# Patient Record
Sex: Female | Born: 1945
Health system: Southern US, Community
[De-identification: ages and names within clinical notes are randomized; demographics above are authoritative.]

## PROBLEM LIST (undated history)

## (undated) DIAGNOSIS — N189 Chronic kidney disease, unspecified: Secondary | ICD-10-CM

## (undated) DIAGNOSIS — M359 Systemic involvement of connective tissue, unspecified: Secondary | ICD-10-CM

## (undated) DIAGNOSIS — I10 Essential (primary) hypertension: Secondary | ICD-10-CM

## (undated) DIAGNOSIS — I2510 Atherosclerotic heart disease of native coronary artery without angina pectoris: Secondary | ICD-10-CM

## (undated) DIAGNOSIS — C801 Malignant (primary) neoplasm, unspecified: Secondary | ICD-10-CM

## (undated) DIAGNOSIS — F1190 Opioid use, unspecified, uncomplicated: Secondary | ICD-10-CM

## (undated) DIAGNOSIS — M1120 Other chondrocalcinosis, unspecified site: Secondary | ICD-10-CM

## (undated) DIAGNOSIS — G709 Myoneural disorder, unspecified: Secondary | ICD-10-CM

## (undated) DIAGNOSIS — B029 Zoster without complications: Secondary | ICD-10-CM

## (undated) DIAGNOSIS — M542 Cervicalgia: Secondary | ICD-10-CM

## (undated) DIAGNOSIS — M4316 Spondylolisthesis, lumbar region: Secondary | ICD-10-CM

## (undated) DIAGNOSIS — R29898 Other symptoms and signs involving the musculoskeletal system: Secondary | ICD-10-CM

## (undated) DIAGNOSIS — M1990 Unspecified osteoarthritis, unspecified site: Secondary | ICD-10-CM

## (undated) DIAGNOSIS — Z8679 Personal history of other diseases of the circulatory system: Secondary | ICD-10-CM

## (undated) DIAGNOSIS — M5030 Other cervical disc degeneration, unspecified cervical region: Secondary | ICD-10-CM

## (undated) DIAGNOSIS — I639 Cerebral infarction, unspecified: Secondary | ICD-10-CM

## (undated) DIAGNOSIS — D649 Anemia, unspecified: Secondary | ICD-10-CM

## (undated) DIAGNOSIS — M810 Age-related osteoporosis without current pathological fracture: Secondary | ICD-10-CM

## (undated) DIAGNOSIS — E785 Hyperlipidemia, unspecified: Secondary | ICD-10-CM

## (undated) DIAGNOSIS — G894 Chronic pain syndrome: Secondary | ICD-10-CM

## (undated) DIAGNOSIS — C44300 Unspecified malignant neoplasm of skin of unspecified part of face: Secondary | ICD-10-CM

## (undated) DIAGNOSIS — Z9289 Personal history of other medical treatment: Secondary | ICD-10-CM

## (undated) DIAGNOSIS — H25019 Cortical age-related cataract, unspecified eye: Secondary | ICD-10-CM

## (undated) DIAGNOSIS — R011 Cardiac murmur, unspecified: Secondary | ICD-10-CM

## (undated) DIAGNOSIS — I499 Cardiac arrhythmia, unspecified: Secondary | ICD-10-CM

## (undated) DIAGNOSIS — M51369 Other intervertebral disc degeneration, lumbar region without mention of lumbar back pain or lower extremity pain: Secondary | ICD-10-CM

## (undated) DIAGNOSIS — I771 Stricture of artery: Secondary | ICD-10-CM

## (undated) DIAGNOSIS — M5136 Other intervertebral disc degeneration, lumbar region: Secondary | ICD-10-CM

## (undated) HISTORY — PX: MRI: SHX5353

## (undated) HISTORY — PX: CERVICAL SPINE SURGERY: SHX589

## (undated) HISTORY — PX: CORRECTION HAMMER TOE: SUR317

## (undated) HISTORY — PX: BACK SURGERY: SHX140

## (undated) HISTORY — DX: Stricture of artery: I77.1

## (undated) HISTORY — DX: Cerebral infarction, unspecified: I63.9

---

## 1979-01-17 HISTORY — PX: ABDOMINAL HYSTERECTOMY: SHX81

## 1979-01-17 HISTORY — PX: ROTATOR CUFF REPAIR: SHX139

## 1999-01-17 HISTORY — PX: SPINAL FUSION: SHX223

## 1999-01-17 HISTORY — PX: POSTERIOR LUMBAR FUSION: SHX6036

## 2001-01-16 HISTORY — PX: ANTERIOR CERVICAL DECOMP/DISCECTOMY FUSION: SHX1161

## 2001-01-16 HISTORY — PX: SPINAL FUSION: SHX223

## 2004-07-27 ENCOUNTER — Ambulatory Visit: Payer: Self-pay | Admitting: Pain Medicine

## 2008-05-12 DIAGNOSIS — G479 Sleep disorder, unspecified: Secondary | ICD-10-CM | POA: Insufficient documentation

## 2008-05-12 DIAGNOSIS — M542 Cervicalgia: Secondary | ICD-10-CM | POA: Insufficient documentation

## 2008-05-12 DIAGNOSIS — I10 Essential (primary) hypertension: Secondary | ICD-10-CM | POA: Insufficient documentation

## 2008-07-24 DIAGNOSIS — L709 Acne, unspecified: Secondary | ICD-10-CM | POA: Insufficient documentation

## 2008-07-24 DIAGNOSIS — R42 Dizziness and giddiness: Secondary | ICD-10-CM | POA: Insufficient documentation

## 2008-10-26 DIAGNOSIS — D493 Neoplasm of unspecified behavior of breast: Secondary | ICD-10-CM | POA: Insufficient documentation

## 2010-11-30 ENCOUNTER — Ambulatory Visit: Payer: Self-pay

## 2010-11-30 DIAGNOSIS — M25579 Pain in unspecified ankle and joints of unspecified foot: Secondary | ICD-10-CM | POA: Insufficient documentation

## 2011-03-28 DIAGNOSIS — G8929 Other chronic pain: Secondary | ICD-10-CM | POA: Insufficient documentation

## 2011-03-28 DIAGNOSIS — M542 Cervicalgia: Secondary | ICD-10-CM

## 2011-04-17 ENCOUNTER — Emergency Department: Payer: Self-pay | Admitting: *Deleted

## 2011-04-17 LAB — COMPREHENSIVE METABOLIC PANEL
Alkaline Phosphatase: 106 U/L (ref 50–136)
Bilirubin,Total: 0.7 mg/dL (ref 0.2–1.0)
Calcium, Total: 9.3 mg/dL (ref 8.5–10.1)
Chloride: 98 mmol/L (ref 98–107)
Co2: 25 mmol/L (ref 21–32)
Glucose: 140 mg/dL — ABNORMAL HIGH (ref 65–99)
SGPT (ALT): 26 U/L
Sodium: 138 mmol/L (ref 136–145)
Total Protein: 8.4 g/dL — ABNORMAL HIGH (ref 6.4–8.2)

## 2011-04-17 LAB — URINALYSIS, COMPLETE
Bilirubin,UR: NEGATIVE
Ph: 5 (ref 4.5–8.0)
Protein: NEGATIVE
RBC,UR: 5 /HPF (ref 0–5)
Squamous Epithelial: 1

## 2011-04-17 LAB — CBC
HCT: 48 % — ABNORMAL HIGH (ref 35.0–47.0)
MCH: 30.9 pg (ref 26.0–34.0)
MCHC: 34.1 g/dL (ref 32.0–36.0)
RBC: 5.31 10*6/uL — ABNORMAL HIGH (ref 3.80–5.20)
RDW: 14.8 % — ABNORMAL HIGH (ref 11.5–14.5)

## 2011-04-18 ENCOUNTER — Ambulatory Visit: Payer: Self-pay | Admitting: Family Medicine

## 2011-04-27 ENCOUNTER — Ambulatory Visit: Payer: Self-pay | Admitting: Family Medicine

## 2011-10-31 DIAGNOSIS — M359 Systemic involvement of connective tissue, unspecified: Secondary | ICD-10-CM | POA: Insufficient documentation

## 2011-10-31 DIAGNOSIS — M25469 Effusion, unspecified knee: Secondary | ICD-10-CM | POA: Insufficient documentation

## 2011-10-31 DIAGNOSIS — M1120 Other chondrocalcinosis, unspecified site: Secondary | ICD-10-CM | POA: Insufficient documentation

## 2012-06-20 DIAGNOSIS — M961 Postlaminectomy syndrome, not elsewhere classified: Secondary | ICD-10-CM | POA: Insufficient documentation

## 2012-06-20 DIAGNOSIS — Z9889 Other specified postprocedural states: Secondary | ICD-10-CM | POA: Insufficient documentation

## 2012-11-05 ENCOUNTER — Ambulatory Visit: Payer: Self-pay | Admitting: Anesthesiology

## 2013-02-19 ENCOUNTER — Ambulatory Visit: Payer: Self-pay | Admitting: General Practice

## 2013-02-19 LAB — URINALYSIS, COMPLETE
BILIRUBIN, UR: NEGATIVE
BLOOD: NEGATIVE
Glucose,UR: NEGATIVE mg/dL (ref 0–75)
KETONE: NEGATIVE
Leukocyte Esterase: NEGATIVE
NITRITE: NEGATIVE
PROTEIN: NEGATIVE
Ph: 8 (ref 4.5–8.0)
RBC,UR: <1 /HPF (ref 0–5)
SQUAMOUS EPITHELIAL: NONE SEEN
Specific Gravity: 1.01 (ref 1.003–1.030)
WBC UR: NONE SEEN /HPF (ref 0–5)

## 2013-02-19 LAB — BASIC METABOLIC PANEL
ANION GAP: 4 — AB (ref 7–16)
BUN: 42 mg/dL — AB (ref 7–18)
Calcium, Total: 9.3 mg/dL (ref 8.5–10.1)
Chloride: 98 mmol/L (ref 98–107)
Co2: 31 mmol/L (ref 21–32)
Creatinine: 1.1 mg/dL (ref 0.60–1.30)
EGFR (Non-African Amer.): 52 — ABNORMAL LOW
Glucose: 105 mg/dL — ABNORMAL HIGH (ref 65–99)
Osmolality: 277 (ref 275–301)
Potassium: 3.2 mmol/L — ABNORMAL LOW (ref 3.5–5.1)
Sodium: 133 mmol/L — ABNORMAL LOW (ref 136–145)

## 2013-02-19 LAB — CBC
HCT: 44.3 % (ref 35.0–47.0)
HGB: 15.2 g/dL (ref 12.0–16.0)
MCH: 30.8 pg (ref 26.0–34.0)
MCHC: 34.3 g/dL (ref 32.0–36.0)
MCV: 90 fL (ref 80–100)
Platelet: 268 10*3/uL (ref 150–440)
RBC: 4.92 10*6/uL (ref 3.80–5.20)
RDW: 14.1 % (ref 11.5–14.5)
WBC: 9.8 10*3/uL (ref 3.6–11.0)

## 2013-02-19 LAB — PROTIME-INR
INR: 0.9
PROTHROMBIN TIME: 12.3 s (ref 11.5–14.7)

## 2013-02-19 LAB — MRSA PCR SCREENING

## 2013-02-19 LAB — APTT: ACTIVATED PTT: 25.6 s (ref 23.6–35.9)

## 2013-02-19 LAB — SEDIMENTATION RATE: Erythrocyte Sed Rate: 10 mm/hr (ref 0–30)

## 2013-02-20 LAB — URINE CULTURE

## 2013-03-03 ENCOUNTER — Inpatient Hospital Stay: Payer: Self-pay | Admitting: General Practice

## 2013-03-03 HISTORY — PX: TOTAL KNEE ARTHROPLASTY: SHX125

## 2013-03-03 HISTORY — PX: JOINT REPLACEMENT: SHX530

## 2013-03-04 LAB — BASIC METABOLIC PANEL
ANION GAP: 6 — AB (ref 7–16)
BUN: 27 mg/dL — AB (ref 7–18)
CO2: 27 mmol/L (ref 21–32)
Calcium, Total: 8.3 mg/dL — ABNORMAL LOW (ref 8.5–10.1)
Chloride: 103 mmol/L (ref 98–107)
Creatinine: 1.28 mg/dL (ref 0.60–1.30)
EGFR (African American): 50 — ABNORMAL LOW
EGFR (Non-African Amer.): 43 — ABNORMAL LOW
Glucose: 127 mg/dL — ABNORMAL HIGH (ref 65–99)
Osmolality: 279 (ref 275–301)
Potassium: 3.8 mmol/L (ref 3.5–5.1)
SODIUM: 136 mmol/L (ref 136–145)

## 2013-03-04 LAB — HEMOGLOBIN: HGB: 10.7 g/dL — AB (ref 12.0–16.0)

## 2013-03-04 LAB — PLATELET COUNT: PLATELETS: 201 10*3/uL (ref 150–440)

## 2013-03-05 LAB — HEMOGLOBIN: HGB: 9.9 g/dL — ABNORMAL LOW (ref 12.0–16.0)

## 2013-03-05 LAB — PLATELET COUNT: Platelet: 202 10*3/uL (ref 150–440)

## 2013-03-05 LAB — BASIC METABOLIC PANEL
Anion Gap: 4 — ABNORMAL LOW (ref 7–16)
BUN: 28 mg/dL — AB (ref 7–18)
Calcium, Total: 8.3 mg/dL — ABNORMAL LOW (ref 8.5–10.1)
Chloride: 107 mmol/L (ref 98–107)
Co2: 29 mmol/L (ref 21–32)
Creatinine: 1.21 mg/dL (ref 0.60–1.30)
EGFR (African American): 54 — ABNORMAL LOW
EGFR (Non-African Amer.): 46 — ABNORMAL LOW
Glucose: 94 mg/dL (ref 65–99)
Osmolality: 285 (ref 275–301)
Potassium: 3.9 mmol/L (ref 3.5–5.1)
SODIUM: 140 mmol/L (ref 136–145)

## 2013-03-31 DIAGNOSIS — E782 Mixed hyperlipidemia: Secondary | ICD-10-CM | POA: Insufficient documentation

## 2013-04-21 DIAGNOSIS — M1180 Other specified crystal arthropathies, unspecified site: Secondary | ICD-10-CM | POA: Insufficient documentation

## 2013-05-22 DIAGNOSIS — Z8679 Personal history of other diseases of the circulatory system: Secondary | ICD-10-CM | POA: Insufficient documentation

## 2013-05-22 DIAGNOSIS — Z87898 Personal history of other specified conditions: Secondary | ICD-10-CM | POA: Insufficient documentation

## 2013-05-22 DIAGNOSIS — I10 Essential (primary) hypertension: Secondary | ICD-10-CM | POA: Insufficient documentation

## 2013-07-21 DIAGNOSIS — Z7952 Long term (current) use of systemic steroids: Secondary | ICD-10-CM | POA: Insufficient documentation

## 2013-07-29 DIAGNOSIS — R42 Dizziness and giddiness: Secondary | ICD-10-CM | POA: Insufficient documentation

## 2013-09-23 ENCOUNTER — Ambulatory Visit: Payer: Self-pay | Admitting: Family Medicine

## 2013-10-13 ENCOUNTER — Ambulatory Visit: Payer: Self-pay | Admitting: Family Medicine

## 2013-10-16 DIAGNOSIS — F5101 Primary insomnia: Secondary | ICD-10-CM | POA: Insufficient documentation

## 2013-10-16 DIAGNOSIS — Z79899 Other long term (current) drug therapy: Secondary | ICD-10-CM | POA: Insufficient documentation

## 2013-12-23 LAB — HM DEXA SCAN

## 2013-12-29 DIAGNOSIS — I340 Nonrheumatic mitral (valve) insufficiency: Secondary | ICD-10-CM | POA: Insufficient documentation

## 2014-01-19 DIAGNOSIS — M25562 Pain in left knee: Secondary | ICD-10-CM | POA: Diagnosis not present

## 2014-01-19 DIAGNOSIS — M8580 Other specified disorders of bone density and structure, unspecified site: Secondary | ICD-10-CM | POA: Diagnosis not present

## 2014-01-19 DIAGNOSIS — Z96652 Presence of left artificial knee joint: Secondary | ICD-10-CM | POA: Diagnosis not present

## 2014-02-26 DIAGNOSIS — Z96652 Presence of left artificial knee joint: Secondary | ICD-10-CM | POA: Diagnosis not present

## 2014-02-26 DIAGNOSIS — M76892 Other specified enthesopathies of left lower limb, excluding foot: Secondary | ICD-10-CM | POA: Diagnosis not present

## 2014-05-09 NOTE — Discharge Summary (Signed)
PATIENT NAME:  Molly Mitchell, Molly Mitchell MR#:  [id number] DATE OF BIRTH:  03-10-1945  DATE OF ADMISSION:  03/03/2013 DATE OF DISCHARGE:  03/06/2013  ADMITTING DIAGNOSIS: Degenerative arthrosis of left knee.   DISCHARGE DIAGNOSIS: Degenerative arthrosis of the left  knee.   HISTORY: The patient is a 69 year old who has been following at  [facility] for progression of left knee pain. She reported a 5 year history of persistent knee pain. She had not seen any significant improvement in her condition despite corticosteroid injections, as well as physical therapy. She had episodes of swelling of the knee as well as pain with weight-bearing activities. She was having difficulty with stair ambulation as well as with attempts at squatting. She had tried to maintain her activities with exercise program at the local gym. At the time of surgery, she was not using any ambulatory aid. The patient states that the pain had progressed to the point that it significantly interfering with her activities of daily living. X-rays taken in [place] showed bone-on-bone to the lateral compartment space with subchondral sclerosis as well as osteophyte formation being noted. There was no valgus deformity noted of approximately 13 degrees. After discussion of the risks and benefits of surgical intervention, the patient expressed her understanding of the risks and benefits and agreed for plans for surgical intervention.   PROCEDURE: Left total knee arthroplasty using computer-assisted navigation.   ANESTHESIA: General.   SOFT TISSUE RELEASE: Anterior cruciate ligament, posterior cruciate ligament, deep medial collateral ligaments, as well as patellofemoral ligament.   IMPLANTS UTILIZED: DePuy PFC Sigma size 3 posterior stabilized femoral component (cemented), size 2.5 MBT tibial component (cemented), 35 mm three pegged oval dome patella (cemented), and a 10 mm stabilized rotating platform polyethylene insert.    HOSPITAL COURSE: The patient tolerated the procedure very well. She had no complications. She was then taken to the PAC-U where she was stabilized and then transferred to the orthopedic floor. She began receiving anticoagulation therapy of Lovenox 30 mg subcutaneous every 12 hours per anesthesia and pharmacy protocol. She was fitted with TED stockings bilaterally. These were allowed to be removed one hour per eight hour shift. The left one was applied on day two following removal of the Hemovac and dressing change. The patient's heels were elevated off the bed using rolled towels. The has been no evidence of any DVTs. Negative Homans sign.   The patient has denied any chest pain or shortness of breath. Vital signs have been stable. She has been afebrile. Hemodynamically she was stable. No transfusions were given other than the Autovac transfusion given the first six hours postoperatively.   Physical therapy was initiated on day one for gait training and transfers. She did extremely well on day one. She was ambulating greater than 200 feet. She was able to get out of bed on her own. Had a range of motion of 0 to 1 or 2. However, the following day she was noted to have severe pain and had difficulty with therapy. However, she has a return to her level and was able to ambulate 200 feet and go up four steps. Occupational therapy was also initiated on day one for activities of daily living and assistive devices.   The patient's IV, Foley and Hemovac were discontinued on day two along with a dressing change. The wound was free of any drainage or signs of infection. Polar Care was reapplied to the surgical leg, maintaining a temperature of 40 to 50 degrees Fahrenheit.   DISPOSITION:  The patient is being discharged to home in improved stable condition.   DISCHARGE INSTRUCTIONS: She will continue weight-bearing as tolerated. Continue using a walker until told by physical therapy to go to a quad cane. Elevate  the lower extremities. Continue with TED stockings bilaterally. These are allowed to be removed at night but are to be worn during the day. Recommend elevating the heels off the bed. Incentive spirometer q.1h. while awake and as well as encourage cough, deep breathing two hours while awake. She is placed on a regular diet. Continue with the Polar Care maintaining a temperature of 40 to 50 degrees Fahrenheit. She is not to get the dressing wet. She will not be able to take a shower until the staples are removed at two weeks. She has a follow-up appointment on May 3rd at 845. She is to call the clinic sooner if any temperatures of 101.5 or greater or excessive bleeding.   DRUG ALLERGIES: AMITRIPTYLINE DURICEF, ERYTHROMYCIN AND TETANUS TOXOID.   MEDICATIONS: She is to resume her regular medication that she was on prior to admission. She was given a prescription for oxycodone 5 to 10 mg every 4 to 6 hours p.r.n. for pain, tramadol 50 to 100 mg every 4 to 6 hours p.r.n. for pain and Lovenox 40 mg subcutaneously daily for 14 days, then discontinue and begin taking  one 81 mg enteric-coated aspirin.   PAST MEDICAL HISTORY: Cataracts, varicella, hypertension, osteoporosis, shingles, pseudogout.  ____________________________ Vance Peper, PA jrw:sg D: 03/05/2013 10:18:15 ET T: 03/05/2013 11:01:29 ET JOB#: [id number]  cc: Vance Peper, PA, <Dictator> Kaylena Pacifico PA ELECTRONICALLY SIGNED 03/11/2013 8:01

## 2014-05-09 NOTE — Discharge Summary (Signed)
PATIENT NAME:  Molly Mitchell, KOERBER MR#:  [id number] DATE OF BIRTH:  [birth year]  DATE OF ADMISSION:  03/03/2013 DATE OF DISCHARGE:  03/06/2013   ADMITTING DIAGNOSIS: Degenerative arthrosis of the left knee.   DISCHARGE DIAGNOSIS: Degenerative arthrosis of the left knee.   OPERATION: On 03/03/2013, the patient had a left total knee arthroplasty using computer-aided navigation.   SURGEON: Laurice Record. Holley Bouche., MD  ASSISTANT: Vance Peper, PA   ANESTHESIA: General.   ESTIMATED BLOOD LOSS: 50 mL.   TOURNIQUET TIME: 80 minutes.   IMPLANTS USED: DePuy PFC Sigma size 3 posterior stabilized femoral component that was cemented, size 2.5 MBT tibial component (cemented), 35 mm 3 peg oval dome patella that was cemented, and 10 mm stabilized rotating platform polyethylene insert. The patient was stabilized, brought to the recovery room and then brought down to the orthopedic floor for pain control and physical therapy.   HISTORY: The patient is a 69 year old female who presented for upcoming total knee replacement on the left. The patient has had persistent left knee pain that has been worsening, with attempted relief with cortisone injections, physical therapy and activities of daily living modification. The patient continued to have pain though.   PHYSICAL EXAMINATION:  GENERAL: Well-developed, well-nourished female with an antalgic gait and a valgus thrust to the left knee.  HEART: Regular rate and rhythm.  LUNGS: Clear to auscultation.  MUSCULOSKELETAL: In regard to the left knee, the patient has lateral joint line tenderness with range of motion and full extension to 128 degrees of flexion. The patient has good quadriceps control. The patient is neurovascularly intact.   HOSPITAL COURSE: After initial admission on 03/03/2013, the patient was brought to the orthopedic floor. On postoperative day 1, the patient had a hemoglobin of 10.7, which dropped down to around 9.9 with no use of transfusion  postoperatively. The patient did receive Autovac transfusion though on postoperative day 1. The patient worked with physical therapy, initially ambulating almost 200 feet on day 1 with 102 degrees range of motion and continued progressing well and was ready to go home on 03/06/2013.   CONDITION AT DISCHARGE: Stable.   DISPOSITION: The patient was sent home with home health physical therapy.   DISCHARGE INSTRUCTIONS:  1. The patient will follow up with [facility] orthopedics on 03/18/2013 with Vance Peper. 2. The patient will do weight bear as tolerated on the affected leg and try to elevate her leg with 1 to 2 pillows to keep the foot higher than the knee.  3. The patient will use thigh-high TED hose on both legs, removed at bedtime, and will elevate the heels off the bed.  4. The patient will use the incentive spirometer every hour while awake and be encouraged to do cough and deep breathing.  5. The patient will use regular diet.  6. The patient will use her Polar Care to decrease swelling and try not to get her dressing wet or dirty and leave the dressing on and have it changed on a p.r.n. basis with physical therapy.  7. The patient will call the clinic if there is any bright red bleeding or any calf pain or bowel or bladder difficulty or fever greater than 101.5.  8. The patient will do physical therapy, working on gait training, range of motion and strengthening.  9. The patient will not shower until she has her staples removed.   DISCHARGE MEDICATIONS: Resume home medication and to begin taking oxycodone 5 mg 1 tablet q.4 hours as needed  for pain, tramadol 50 mg 1 tablet q.4-6 hours p.r.n. for less severe pain, Lovenox 40 mg subcutaneous once a day for 14 days, then stop and begin enteric-coated aspirin for the next month.    ____________________________ J. Reche Dixon, [place] jtm:lb D: 03/06/2013 07:00:14 ET T: 03/06/2013 07:08:43 ET JOB#: [id number]  cc: J. Reche Dixon, [place], <Dictator> J  Rudolfo Brand[facility] PA ELECTRONICALLY SIGNED 03/09/2013 6:59

## 2014-05-09 NOTE — Op Note (Signed)
PATIENT NAME:  Molly Mitchell, Molly Mitchell MR#:  [id number] DATE OF BIRTH:  03/23/1945  DATE OF PROCEDURE:  03/03/2013  PREOPERATIVE DIAGNOSIS: Degenerative arthrosis of the left knee.   POSTOPERATIVE DIAGNOSIS: Degenerative arthrosis of the left knee.   PROCEDURE PERFORMED: Left total knee arthroplasty using computer-assisted navigation.   SURGEON: Skip Estimable, M.D.   ASSISTANT: Vance Peper, PA (required to maintain retraction throughout the procedure).   ANESTHESIA: General.   ESTIMATED BLOOD LOSS: 50 mL.   FLUIDS REPLACED: 1500 mL of crystalloid.   TOURNIQUET TIME: 80 minutes.   DRAINS: Two medium drains to reinfusion system.   SOFT TISSUE RELEASES: Anterior cruciate ligament, posterior cruciate ligament, deep medial collateral ligament and patellofemoral ligament.   IMPLANTS UTILIZED: DePuy PFC Sigma size 3 posterior stabilized femoral component (cemented), size 2.5 MBT tibial component (cemented), 35 mm 3-peg oval dome patella (cemented) and a 10 mm stabilized rotating platform polyethylene insert.   INDICATIONS FOR SURGERY: The patient is a 69 year old female, who has been seen for complaints of progressive left knee pain. X-rays demonstrated severe degenerative changes with relative valgus deformity. After discussion of the risks and benefits of surgical intervention, the patient expressed understanding of the risks, benefits, and agreed with plans for surgical intervention.   PROCEDURE IN DETAIL: The patient was brought to the operating room then, after adequate general anesthesia was achieved, a tourniquet was placed on the patient's upper left thigh. The patient's left knee and leg were cleaned and prepped with alcohol and DuraPrep draped in the usual sterile fashion. A "timeout" was performed as per usual protocol. The left lower extremity was exsanguinated using an Esmarch and the tourniquet was inflated to 300 mmHg. An anterior longitudinal incision was made followed by a standard mid  vastus approach. A moderate effusion was evacuated. The deep fibers of the medial collateral ligament were elevated in a subperiosteal fashion off the medial flare of the tibia so as to maintain a continuous soft tissue sleeve. The patella was subluxed laterally and the patellofemoral ligament was incised. Inspection of the knee demonstrated severe degenerative changes in tricompartmental fashion with full-thickness loss of articular cartilage to the lateral compartment. Prominent osteophytes were debrided using a rongeur. Anterior and posterior cruciate ligaments were excised. Two 4.0 mm Schanz pins were inserted into the femur and into the tibia for attachment of the ray of trackers used for computer-assisted navigation. Hip center was identified using a circumduction technique. Distal landmarks were mapped using the computer. The distal femur and proximal tibia were mapped using the computer. Distal femoral cutting guide was positioned using computer-assisted navigation so as to achieve a 5 degree distal valgus cut. Cut was performed and verified using the computer. Distal femur was sized and it was felt that a size 3 femoral component was appropriate. A size 3 cutting guide was positioned and anterior cut was performed and verified using the computer. This was followed by completion of the posterior and chamfer cuts. Femoral cutting guide for the central box was then positioned and the central box cut was performed.   Attention was then directed to the proximal tibia. Medial and lateral menisci were excised. The extramedullary tibial cutting guide was positioned using computer-assisted navigation so as to achieve a 0 degree varus valgus alignment and 0 degree posterior slope. Cut was performed and verified using the computer. The proximal tibia was sized and it was felt that a size 2.5 tibial tray was appropriate. Tibial and femoral trials were inserted followed by insertion of a 10 mm  polyethylene insert. The  knee was tight in extension, but good medial and lateral soft tissue balancing was appreciated. Trial components were removed and the extramedullary tibial cutting guide was positioned so as to resect an additional 2 mm of bone. Cut was performed and verified using the computer. Tibial and femoral trials were reinserted, followed by insertion of a 10 mm polyethylene trial. Excellent medial and lateral soft tissue balance was appreciated both in full extension and in flexion. Finally, the patella was cut and prepared so as to accommodate a 35 mm 3-peg oval dome patella. Patellar trial was placed and the knee was placed through a range of motion with excellent patellar tracking appreciated. The femoral trial was removed. Central post hole for the tibial component was reamed followed by insertion of a keel punch. The tibial trials were then removed. The cut surfaces of bone were irrigated with copious amounts of normal saline with antibiotic solution using pulsatile lavage and then suctioned dry. Polymethyl methacrylate cement was prepared in the usual fashion using a vacuum mixer. Cement was applied to the cut surface of the proximal tibia as well as along the undersurface of a size 2.5 MBT tibial component. The tibial component was positioned and impacted into place. Excess cement was removed using [employer] fast streamer. Cement was then applied to the cut surface of the femur as well as along the posterior flanges of a size 3 posterior stabilized femoral component. Femoral component was positioned and impacted into place. Excess cement was removed using [employer] fast streamer.   A 10 mm polyethylene trial was inserted and the knee was brought in full extension with steady axial compression applied. Finally, cement was applied to the backside of a 35 mm 3-peg oval dome patella and patellar component was positioned and patellar clamp applied. Excess cement was removed using [employer] fast streamer. After adequate curing of cement, the  tourniquet was deflated after total tourniquet time of 80 minutes. Hemostasis was achieved using electrocautery. The knee was irrigated with copious amounts of normal saline with antibiotic solution using pulsatile lavage and then suctioned dry. The knee was inspected for any residual cement debris. Exparel 20 mL of 1.3% in 40 mL of normal saline was injected along the posterior capsule, medial and lateral gutters and along the arthrotomy site. A 10 mm stabilized rotating platform polyethylene insert was inserted and the knee was placed through a range of motion. Excellent patellar tracking was appreciated and excellent mediolateral soft tissue balancing was noted. Two medium drains were placed in the wound bed and brought out through a separate stab incision to be attached to a reinfusion system. The medial parapatellar portion of the incision was reapproximated using interrupted sutures of #1 Vicryl. The subcutaneous tissue was approximated in layers using first #0 Vicryl followed by 2-0 Vicryl. Skin was closed with skin staples. A sterile dressing was applied. The patient tolerated the procedure well. She was transported to the recovery room in stable condition. ____________________________ Laurice Record. Holley Bouche., MD jph:aw D: 03/03/2013 10:40:17 ET T: 03/03/2013 10:54:03 ET JOB#: [id number]  cc: Jeneen Rinks P. Holley Bouche., MD, <Dictator> Laurice Record Holley Bouche MD ELECTRONICALLY SIGNED 03/07/2013 6:44

## 2014-05-13 ENCOUNTER — Other Ambulatory Visit: Payer: Self-pay

## 2014-05-13 DIAGNOSIS — M546 Pain in thoracic spine: Secondary | ICD-10-CM | POA: Insufficient documentation

## 2014-05-13 DIAGNOSIS — M25569 Pain in unspecified knee: Secondary | ICD-10-CM | POA: Insufficient documentation

## 2014-05-13 DIAGNOSIS — F1193 Opioid use, unspecified with withdrawal: Secondary | ICD-10-CM | POA: Insufficient documentation

## 2014-05-13 DIAGNOSIS — K047 Periapical abscess without sinus: Secondary | ICD-10-CM | POA: Insufficient documentation

## 2014-05-13 DIAGNOSIS — J45909 Unspecified asthma, uncomplicated: Secondary | ICD-10-CM | POA: Insufficient documentation

## 2014-05-13 DIAGNOSIS — D72829 Elevated white blood cell count, unspecified: Secondary | ICD-10-CM | POA: Insufficient documentation

## 2014-05-13 DIAGNOSIS — M1710 Unilateral primary osteoarthritis, unspecified knee: Secondary | ICD-10-CM | POA: Insufficient documentation

## 2014-05-13 DIAGNOSIS — Z1331 Encounter for screening for depression: Secondary | ICD-10-CM | POA: Insufficient documentation

## 2014-05-13 DIAGNOSIS — B029 Zoster without complications: Secondary | ICD-10-CM | POA: Insufficient documentation

## 2014-05-13 DIAGNOSIS — J45991 Cough variant asthma: Secondary | ICD-10-CM | POA: Insufficient documentation

## 2014-05-13 DIAGNOSIS — F1123 Opioid dependence with withdrawal: Secondary | ICD-10-CM | POA: Insufficient documentation

## 2014-05-13 DIAGNOSIS — I459 Conduction disorder, unspecified: Secondary | ICD-10-CM | POA: Insufficient documentation

## 2014-05-13 DIAGNOSIS — R5383 Other fatigue: Secondary | ICD-10-CM | POA: Insufficient documentation

## 2014-05-13 DIAGNOSIS — J209 Acute bronchitis, unspecified: Secondary | ICD-10-CM | POA: Insufficient documentation

## 2014-05-13 DIAGNOSIS — E785 Hyperlipidemia, unspecified: Secondary | ICD-10-CM | POA: Insufficient documentation

## 2014-05-13 DIAGNOSIS — H6980 Other specified disorders of Eustachian tube, unspecified ear: Secondary | ICD-10-CM | POA: Insufficient documentation

## 2014-05-13 DIAGNOSIS — Z96659 Presence of unspecified artificial knee joint: Secondary | ICD-10-CM | POA: Insufficient documentation

## 2014-05-13 DIAGNOSIS — R1011 Right upper quadrant pain: Secondary | ICD-10-CM | POA: Insufficient documentation

## 2014-05-13 DIAGNOSIS — M8580 Other specified disorders of bone density and structure, unspecified site: Secondary | ICD-10-CM | POA: Insufficient documentation

## 2014-05-13 DIAGNOSIS — M179 Osteoarthritis of knee, unspecified: Secondary | ICD-10-CM | POA: Insufficient documentation

## 2014-05-13 DIAGNOSIS — E878 Other disorders of electrolyte and fluid balance, not elsewhere classified: Secondary | ICD-10-CM | POA: Insufficient documentation

## 2014-05-13 DIAGNOSIS — M2550 Pain in unspecified joint: Secondary | ICD-10-CM | POA: Insufficient documentation

## 2014-06-25 DIAGNOSIS — L03116 Cellulitis of left lower limb: Secondary | ICD-10-CM | POA: Diagnosis not present

## 2014-06-25 DIAGNOSIS — Z96652 Presence of left artificial knee joint: Secondary | ICD-10-CM | POA: Diagnosis not present

## 2014-07-02 DIAGNOSIS — M7632 Iliotibial band syndrome, left leg: Secondary | ICD-10-CM | POA: Diagnosis not present

## 2014-07-02 DIAGNOSIS — Z96652 Presence of left artificial knee joint: Secondary | ICD-10-CM | POA: Diagnosis not present

## 2014-07-02 DIAGNOSIS — L03116 Cellulitis of left lower limb: Secondary | ICD-10-CM | POA: Diagnosis not present

## 2014-07-14 ENCOUNTER — Encounter: Payer: Self-pay | Admitting: Family Medicine

## 2014-07-14 DIAGNOSIS — Z96652 Presence of left artificial knee joint: Secondary | ICD-10-CM | POA: Diagnosis not present

## 2014-07-16 ENCOUNTER — Encounter: Payer: Self-pay | Admitting: Family Medicine

## 2014-07-17 ENCOUNTER — Ambulatory Visit (INDEPENDENT_AMBULATORY_CARE_PROVIDER_SITE_OTHER): Payer: Commercial Managed Care - HMO | Admitting: Family Medicine

## 2014-07-17 ENCOUNTER — Other Ambulatory Visit: Payer: Self-pay

## 2014-07-17 ENCOUNTER — Encounter: Payer: Self-pay | Admitting: Family Medicine

## 2014-07-17 VITALS — BP 110/72 | HR 62 | Temp 98.2°F | Resp 16 | Ht 62.25 in | Wt 161.6 lb

## 2014-07-17 DIAGNOSIS — I10 Essential (primary) hypertension: Secondary | ICD-10-CM

## 2014-07-17 DIAGNOSIS — M542 Cervicalgia: Secondary | ICD-10-CM

## 2014-07-17 DIAGNOSIS — Z0000 Encounter for general adult medical examination without abnormal findings: Secondary | ICD-10-CM | POA: Diagnosis not present

## 2014-07-17 DIAGNOSIS — Z96652 Presence of left artificial knee joint: Secondary | ICD-10-CM | POA: Diagnosis not present

## 2014-07-17 DIAGNOSIS — E785 Hyperlipidemia, unspecified: Secondary | ICD-10-CM | POA: Diagnosis not present

## 2014-07-17 DIAGNOSIS — M1712 Unilateral primary osteoarthritis, left knee: Secondary | ICD-10-CM | POA: Diagnosis not present

## 2014-07-17 LAB — POCT URINALYSIS DIPSTICK
Bilirubin, UA: NEGATIVE
Glucose, UA: NEGATIVE
Ketones, UA: NEGATIVE
Leukocytes, UA: NEGATIVE
Nitrite, UA: NEGATIVE
Protein, UA: NEGATIVE
RBC UA: NEGATIVE
Spec Grav, UA: <=1.005
Urobilinogen, UA: 0.2
pH, UA: 8

## 2014-07-17 NOTE — Progress Notes (Signed)
Subjective:    Patient ID: Molly Mitchell, female    DOB: 30-Oct-1945, 68 y.o.   MRN: [id number]  HPI This 69 year old female present for annual wellness exam. Feeling well at the present. Has decided to stop the prednisone she was on from her rheumatologist and the gabapentin from the pain management clinic. Feels the neck and back pains with spasms are well controlled by using the Tizanidine prn at night. No longer taking the Ambien for sleep. States she had a pneumonia vaccination on 03-03-13 during a hospitalization for left knee replacement and she has a history of being allergic to tetanus. Last mammograms in the spring of 2016 were essentially normal. Always declines colonoscopy. BMD test on 12-23-13 showed some osteopenia probably from past chronic use of prednisone. Presently using calcium supplements. Patient Active Problem List   Diagnosis Date Noted  . Acute bronchitis 05/13/2014  . Dysfunction of eustachian tube 05/13/2014  . Cardiac conduction disorder 05/13/2014  . Ache in joint 05/13/2014  . AB (asthmatic bronchitis) 05/13/2014  . Gonalgia 05/13/2014  . Asthma, cough variant 05/13/2014  . Screening for depression 05/13/2014  . Electrolyte imbalance 05/13/2014  . Fatigue 05/13/2014  . H/O total knee replacement 05/13/2014  . HLD (hyperlipidemia) 05/13/2014  . Elevated WBC count 05/13/2014  . Drug withdrawal syndrome to opium 05/13/2014  . Arthritis of knee, degenerative 05/13/2014  . Osteopenia 05/13/2014  . Abdominal pain, right upper quadrant 05/13/2014  . Herpes zona 05/13/2014  . Periapical abscess without sinus tract 05/13/2014  . Pain in thoracic spine 05/13/2014  . MI (mitral incompetence) 12/29/2013  . Idiopathic insomnia 10/16/2013  . Polypharmacy 10/16/2013  . Dizziness 07/29/2013  . Long term current use of systemic steroids 07/21/2013  . History of prolonged Q-T interval on ECG 05/22/2013  . Benign essential HTN 05/22/2013  . 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 Combined fat and carbohydrate induced hyperlipemia 03/31/2013  . S/P lumbar spine operation 06/20/2012  . Cervical post-laminectomy syndrome 06/20/2012  . Tumoral calcinosis 10/31/2011  . Connective tissue disease, undifferentiated 10/31/2011  . Effusion of knee 10/31/2011  . Chondrocalcinosis due to dicalcium phosphate crystals 10/31/2011  . Chronic cervical pain 03/28/2011  . Arthralgia of ankle or foot 11/30/2010  . Neoplasm of breast 10/26/2008  . Acne 07/24/2008  . Dizziness and giddiness 07/24/2008  . Cervical pain 05/12/2008  . Dyssomnia 05/12/2008  . Essential (primary) hypertension 05/12/2008   Past Surgical History  Procedure Laterality Date  . Joint replacement Left 03/03/2013  . Abdominal hysterectomy  1981  . Spinal fusion  1991    lower back   . Spinal fusion  2003    neck   History   Social History  . Marital Status: Married    Spouse Name: N/A  . Number of Children: N/A  . Years of Education: N/A   Social History Main Topics  . Smoking status: Former Smoker    Quit date: 04/17/1979  . Smokeless tobacco: Not on file  . Alcohol Use: No  . Drug Use: No  . Sexual Activity: Not on file   Other Topics Concern  . None   Social History Narrative   Current Outpatient Prescriptions on File Prior to Visit  Medication Sig Dispense Refill  . atenolol (TENORMIN) 50 MG tablet Take 1 tablet by mouth 1 day or 1 dose.     Marland Kitchen CALCIUM-MAGNESIUM-ZINC PO Take 1 tablet by mouth daily.    . clotrimazole-betamethasone (LOTRISONE) cream Apply 1  application topically 2 (two) times daily.    . MULTIPLE VITAMIN PO Take 1 tablet by mouth daily.    . OMEGA-3 FATTY ACIDS PO Take 1 capsule by mouth daily.    Marland Kitchen triamterene-hydrochlorothiazide (MAXZIDE-25) 37.5-25 MG per tablet Take 1 tablet by mouth daily.    Marland Kitchen nystatin ointment (MYCOSTATIN) 1 application as needed.     No current facility-administered medications on file prior to visit.    Allergies  Allergen Reactions  . Amitriptyline Hcl   . Duricef  [Cefadroxil]   . Erythromycin   . Levofloxacin     Severe Shoulder Pain  . Tetanus Toxoid    Immunization History  Administered Date(s) Administered  . Influenza-Unspecified 11/23/2011   Review of Systems  Constitutional: Negative.   HENT: Negative.   Eyes: Negative.   Respiratory: Negative.   Cardiovascular: Negative.   Gastrointestinal: Negative.   Genitourinary: Negative.   Musculoskeletal: Positive for back pain and neck pain.       History of cervical and lumbar fusions.  Skin: Negative.   Neurological: Negative.   Psychiatric/Behavioral: Negative.       BP 110/72 mmHg  Pulse 62  Temp(Src) 98.2 F (36.8 C) (Oral)  Resp 16  Ht 5' 2.25" (1.581 m)  Wt 161 lb 9.6 oz (73.301 kg)  BMI 29.33 kg/m2  SpO2 97%  Objective:   Physical Exam  Constitutional: She is oriented to person, place, and time. She appears well-nourished.  HENT:  Head: Normocephalic and atraumatic.  Right Ear: External ear normal.  Left Ear: External ear normal.  Nose: Nose normal.  Mouth/Throat: Oropharynx is clear and moist.  Eyes: Conjunctivae and EOM are normal. Pupils are equal, round, and reactive to light.  Neck: No thyromegaly present.  Cardiovascular: Normal rate, regular rhythm, normal heart sounds and intact distal pulses.   Pulmonary/Chest: Effort normal and breath sounds normal.  Abdominal: Soft. Bowel sounds are normal.  Genitourinary:  Exam deferred. History of TAH.  Musculoskeletal:  Well healed scars of the left knee from joint replacement. A little decrease of in flexion but able to get to 90 degrees without pain. Stiffness of neck limits rotation to the left significantly. Chronic discomfort and spasm of neck controlled to a tolerable degree.  Lymphadenopathy:    She has no cervical adenopathy.  Neurological: She is alert and oriented to person, place, and time.  Skin: Skin is warm and dry.  Psychiatric:  She has a normal mood and affect. Her behavior is normal. Thought content normal.      Assessment & Plan:   1. Annual physical exam General health good. Declines colonoscopy. Had BMD 12-24-14 consistent with osteopenia suspected secondary to past chronic prednisone use. Encouraged to continue calcium and vitamin D supplements. Will check with insurance about coverage for prevnar 13 and shingles vaccinations.  - POCT urinalysis dipstick  2. Benign essential HTN Well controlled BP. Followed by Dr. Nehemiah Massed (cardiologist annually) and decreased Atenolol to one 50 mg qd with Maxzide-25 mg qd. Tolerating well with good compliance. Will check routine labs and follow up pending reports. - TSH - Comprehensive metabolic panel - CBC with Differential/Platelet  3. Primary osteoarthritis of left knee Stable since left knee replacement 03-03-13. Had a recent infection identified by her orthopedist and treated with Doxycycline. Much improved and states it probably came from the joint replacement. Will recheck CBC with diff and follow up pending reports. - CBC with Differential/Platelet  4. Cervical pain Stable and spasm well controlled by use of Tizanidine  prn. No longer going to pain management or taking any narcotic pain medication.  5. H/O total knee replacement, left Feeling well but experience a "tough" recovery. Follow up with orthopedist as planned.  6. Hyperlipidemia Tolerating Omega-3 Fatty Acid and low fat diet. Will check labs to evaluate progress.  - Lipid panel - TSH - Comprehensive metabolic panel

## 2014-07-18 LAB — COMPREHENSIVE METABOLIC PANEL
ALBUMIN: 5 g/dL — AB (ref 3.6–4.8)
ALT: 19 IU/L (ref 0–32)
AST: 25 IU/L (ref 0–40)
Albumin/Globulin Ratio: 1.6 (ref 1.1–2.5)
Alkaline Phosphatase: 119 IU/L — ABNORMAL HIGH (ref 39–117)
BUN/Creatinine Ratio: 27 — ABNORMAL HIGH (ref 11–26)
BUN: 32 mg/dL — ABNORMAL HIGH (ref 8–27)
Bilirubin Total: 0.7 mg/dL (ref 0.0–1.2)
CALCIUM: 10.3 mg/dL (ref 8.7–10.3)
CHLORIDE: 96 mmol/L — AB (ref 97–108)
CO2: 27 mmol/L (ref 18–29)
Creatinine, Ser: 1.19 mg/dL — ABNORMAL HIGH (ref 0.57–1.00)
GFR calc Af Amer: 54 mL/min/{1.73_m2} — ABNORMAL LOW (ref >59)
GFR, EST NON AFRICAN AMERICAN: 47 mL/min/{1.73_m2} — AB (ref >59)
GLOBULIN, TOTAL: 3.1 g/dL (ref 1.5–4.5)
Glucose: 107 mg/dL — ABNORMAL HIGH (ref 65–99)
Potassium: 4.7 mmol/L (ref 3.5–5.2)
Sodium: 141 mmol/L (ref 134–144)
TOTAL PROTEIN: 8.1 g/dL (ref 6.0–8.5)

## 2014-07-18 LAB — CBC WITH DIFFERENTIAL/PLATELET
BASOS ABS: 0.1 10*3/uL (ref 0.0–0.2)
Basos: 1 %
EOS (ABSOLUTE): 0.2 10*3/uL (ref 0.0–0.4)
Eos: 2 %
Hematocrit: 45.5 % (ref 34.0–46.6)
Hemoglobin: 15.4 g/dL (ref 11.1–15.9)
IMMATURE GRANS (ABS): 0 10*3/uL (ref 0.0–0.1)
Immature Granulocytes: 0 %
Lymphocytes Absolute: 3.3 10*3/uL — ABNORMAL HIGH (ref 0.7–3.1)
Lymphs: 39 %
MCH: 29.7 pg (ref 26.6–33.0)
MCHC: 33.8 g/dL (ref 31.5–35.7)
MCV: 88 fL (ref 79–97)
MONOCYTES: 8 %
Monocytes Absolute: 0.7 10*3/uL (ref 0.1–0.9)
NEUTROS ABS: 4.2 10*3/uL (ref 1.4–7.0)
NEUTROS PCT: 50 %
PLATELETS: 288 10*3/uL (ref 150–379)
RBC: 5.19 x10E6/uL (ref 3.77–5.28)
RDW: 16.7 % — ABNORMAL HIGH (ref 12.3–15.4)
WBC: 8.5 10*3/uL (ref 3.4–10.8)

## 2014-07-18 LAB — TSH: TSH: 1.86 u[IU]/mL (ref 0.450–4.500)

## 2014-07-18 LAB — LIPID PANEL
Chol/HDL Ratio: 3.3 ratio units (ref 0.0–4.4)
Cholesterol, Total: 248 mg/dL — ABNORMAL HIGH (ref 100–199)
HDL: 75 mg/dL (ref >39)
LDL Calculated: 149 mg/dL — ABNORMAL HIGH (ref 0–99)
Triglycerides: 118 mg/dL (ref 0–149)
VLDL Cholesterol Cal: 24 mg/dL (ref 5–40)

## 2014-07-23 ENCOUNTER — Telehealth: Payer: Self-pay

## 2014-07-23 NOTE — Telephone Encounter (Signed)
-----   Message from Margo Common, [place] sent at 07/23/2014  1:02 AM EDT ----- Blood tests show normal thyroid function, slight kidney strain (better than past tests) and no anemia or infection. Cholesterol total and LDL high with very good HDL cholesterol (the "good" cholesterol). May add Red Yeast Rice qd to the Omega-3 Fish Oil and increase water intake with extra fiber in diet. Recheck progress in 3 months.

## 2014-07-23 NOTE — Telephone Encounter (Signed)
Patient advised as directed below. Patient verbalized understanding and agrees with treatment plan. 

## 2014-08-13 DIAGNOSIS — Z96652 Presence of left artificial knee joint: Secondary | ICD-10-CM | POA: Diagnosis not present

## 2014-10-15 ENCOUNTER — Other Ambulatory Visit: Payer: Self-pay | Admitting: Family Medicine

## 2014-10-16 NOTE — Telephone Encounter (Signed)
Patient advised as directed below. Patient verbalized understanding.  

## 2014-10-16 NOTE — Telephone Encounter (Signed)
Advise patient NOT to take potassium supplement with the Maxzide (triamterene-hydrochlorothiazide) because is a potassium sparing combination and last potassium level was very good.

## 2014-12-18 DIAGNOSIS — E782 Mixed hyperlipidemia: Secondary | ICD-10-CM | POA: Diagnosis not present

## 2014-12-18 DIAGNOSIS — I10 Essential (primary) hypertension: Secondary | ICD-10-CM | POA: Diagnosis not present

## 2014-12-18 DIAGNOSIS — Z8679 Personal history of other diseases of the circulatory system: Secondary | ICD-10-CM | POA: Diagnosis not present

## 2014-12-29 ENCOUNTER — Encounter: Payer: Self-pay | Admitting: Family Medicine

## 2014-12-29 ENCOUNTER — Ambulatory Visit (INDEPENDENT_AMBULATORY_CARE_PROVIDER_SITE_OTHER): Payer: Commercial Managed Care - HMO | Admitting: Family Medicine

## 2014-12-29 VITALS — BP 130/72 | HR 57 | Temp 98.3°F | Resp 14 | Wt 165.4 lb

## 2014-12-29 DIAGNOSIS — I10 Essential (primary) hypertension: Secondary | ICD-10-CM | POA: Diagnosis not present

## 2014-12-29 DIAGNOSIS — E782 Mixed hyperlipidemia: Secondary | ICD-10-CM

## 2014-12-29 DIAGNOSIS — H109 Unspecified conjunctivitis: Secondary | ICD-10-CM | POA: Diagnosis not present

## 2014-12-29 MED ORDER — SULFACETAMIDE-PREDNISOLONE 10-0.2 % OP SUSP: 1.0000 [drp] | OPHTHALMIC | Status: DC

## 2014-12-29 NOTE — Progress Notes (Signed)
Patient ID: Molly Mitchell, female   DOB: 1945-07-30, 69 y.o.   MRN: [id number]   Patient: Molly Mitchell Female    DOB: [birth year]   69 y.o.   MRN: [id number] Visit Date: 12/29/2014  Today's Provider: [name], PA   Chief Complaint  Patient presents with  . Eye Problem   Subjective:    Hyperlipidemia The current episode started more than 1 year ago. Recent lipid tests were reviewed and are high. Exacerbated by: chronic prednisone use suspected. Current antihyperlipidemic treatment includes diet change and herbal therapy. There are no compliance problems.     Patient comes in today for left eye redness, itching, and discharge X 1 week. Patient denies blurry vision. Some matting of eyelashes in the mornings. Clear drainage at night down the lateral corner of the left eye. No recent URI symptoms or fever.  Patient Active Problem List   Diagnosis Date Noted  . Acute bronchitis 05/13/2014  . Dysfunction of eustachian tube 05/13/2014  . Cardiac conduction disorder 05/13/2014  . Ache in joint 05/13/2014  . AB (asthmatic bronchitis) 05/13/2014  . Gonalgia 05/13/2014  . Asthma, cough variant 05/13/2014  . Screening for depression 05/13/2014  . Electrolyte imbalance 05/13/2014  . Fatigue 05/13/2014  . H/O total knee replacement 05/13/2014  . HLD (hyperlipidemia) 05/13/2014  . Elevated WBC count 05/13/2014  . Drug withdrawal syndrome to opium ([place]) 05/13/2014  . Arthritis of knee, degenerative 05/13/2014  . Osteopenia 05/13/2014  . Abdominal pain, right upper quadrant 05/13/2014  . Herpes zona 05/13/2014  . Periapical abscess without sinus tract 05/13/2014  . Pain in thoracic spine 05/13/2014  . MI (mitral incompetence) 12/29/2013  . Idiopathic insomnia 10/16/2013  . Polypharmacy 10/16/2013  . Dizziness 07/29/2013  . Long term current use of systemic steroids 07/21/2013  . History of prolonged Q-T interval on ECG 05/22/2013  . Benign essential HTN 05/22/2013  . Personal  history of other diseases of the circulatory system 05/22/2013  . 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 Combined fat and carbohydrate induced hyperlipemia 03/31/2013  . S/P lumbar spine operation 06/20/2012  . Cervical post-laminectomy syndrome 06/20/2012  . Status post lumbar spine operation 06/20/2012  . Tumoral calcinosis 10/31/2011  . Connective tissue disease, undifferentiated ([place]) 10/31/2011  . Effusion of knee 10/31/2011  . Chondrocalcinosis due to dicalcium phosphate crystals 10/31/2011  . Undifferentiated connective tissue disease ([place]) 10/31/2011  . Chronic cervical pain 03/28/2011  . Arthralgia of ankle or foot 11/30/2010  . Neoplasm of breast 10/26/2008  . Acne 07/24/2008  . Dizziness and giddiness 07/24/2008  . Cervical pain 05/12/2008  . Dyssomnia 05/12/2008  . Essential (primary) hypertension 05/12/2008   Past Surgical History  Procedure Laterality Date  . Joint replacement Left 03/03/2013  . Abdominal hysterectomy  1981  . Spinal fusion  1991    lower back   . Spinal fusion  2003    neck   Family History  Problem Relation Age of Onset  . Hypertension Brother   . Diabetes Paternal Grandmother   . Heart failure Maternal Grandmother   . Hypertension Mother   . Hypertension Sister   . Cancer Sister       Allergies  Allergen Reactions  . Amitriptyline Hcl   . Duricef  [Cefadroxil]   . Erythromycin   . Levofloxacin     Severe Shoulder Pain  . Tetanus Toxoid     Previous Medications   ASPIRIN EC 81 MG TABLET    Take by mouth.  ATENOLOL (TENORMIN) 50 MG TABLET    Take 1 tablet by mouth 1 day or 1 dose.    CALCIUM-MAGNESIUM-ZINC PO    Take 1 tablet by mouth daily.   CHOLECALCIFEROL (VITAMIN D3) 1000 UNITS CAPS    Take by mouth.   CLOTRIMAZOLE-BETAMETHASONE (LOTRISONE) CREAM    Apply 1 application topically 2 (two) times daily.   CO-ENZYME Q-10 30 MG CAPS    Take by mouth.   GABAPENTIN (NEURONTIN) 100 MG CAPSULE    Take 2  capsules by mouth at bedtime.   MAGNESIUM OXIDE (MAG-OX) 400 MG TABLET    Take by mouth.   MECLIZINE (ANTIVERT) 25 MG TABLET    Take by mouth.   MOMETASONE-FORMOTEROL (DULERA) 200-5 MCG/ACT AERO    Inhale 1-2 Inhalers into the lungs 2 (two) times daily.   MULTIPLE VITAMIN PO    Take 1 tablet by mouth daily.   NAPROXEN SODIUM PO       NIACIN 500 MG TABLET    Take by mouth.   NYSTATIN OINTMENT (MYCOSTATIN)    1 application as needed.   OMEGA-3 FATTY ACIDS PO    Take 1 capsule by mouth daily.   POTASSIUM 99 MG TABS    Take by mouth.   RED YEAST RICE EXTRACT 600 MG CAPS    Take by mouth.   TIZANIDINE (ZANAFLEX) 2 MG CAPSULE    Take 2 mg by mouth 3 (three) times daily.   TRIAMTERENE-HYDROCHLOROTHIAZIDE (MAXZIDE-25) 37.5-25 MG TABLET    TAKE 1 TABLET EVERY DAY   VITAMIN C (ASCORBIC ACID) 500 MG TABLET    Take by mouth.   VITAMIN E 400 UNIT CAPSULE    1 capsule daily.    Review of Systems  Constitutional: Negative.   HENT: Negative.   Eyes: Positive for discharge, redness and itching.  Respiratory: Negative.   Cardiovascular: Negative.   Gastrointestinal: Negative.   Endocrine: Negative.   Genitourinary: Negative.   Musculoskeletal: Negative.   Skin: Negative.   Allergic/Immunologic: Negative.   Neurological: Negative.   Psychiatric/Behavioral: Negative.     Social History  Substance Use Topics  . Smoking status: Former Smoker    Quit date: 04/17/1979  . Smokeless tobacco: Not on file  . Alcohol Use: No   Objective:    BP 130/72 mmHg  Pulse 57  Temp(Src) 98.3 F (36.8 C) (Oral)  Resp 14  Wt 165 lb 6.4 oz (75.025 kg)  SpO2 97%   Physical Exam  Constitutional: She is oriented to person, place, and time. She appears well-developed and well-nourished.  HENT:  Head: Normocephalic.  Right Ear: External ear normal.  Left Ear: External ear normal.  Nose: Nose normal.  Mouth/Throat: Oropharynx is clear and moist.  Eyes:  Fiery red palpebral and bulbar conjunctiva of the  left eye with clear drainage.  Neck: Normal range of motion. Neck supple.  Cardiovascular: Normal rate and regular rhythm.   Pulmonary/Chest: Effort normal and breath sounds normal.  Abdominal: Soft. Bowel sounds are normal.  Neurological: She is alert and oriented to person, place, and time.      Assessment & Plan:     1. Conjunctivitis of left eye Onset over the past week. May use Claritin prn itch with warm compresses. Given Blephamide eye drops and recheck in 3 days if no better. - sulfacetamide-prednisolone (BLEPHAMIDE) 10-0.2 % ophthalmic suspension; Place 1 drop into the left eye every 3 (three) hours.  Dispense: 5 mL; Refill: 0  2. Combined fat and carbohydrate induced hyperlipemia  Last LDL was 149 with HDL 75 in July 2016. Has tried [employer] because she did not want to get started on any medications. Continue low fat diet and exercise. Recheck pending lab reports. - Comprehensive metabolic panel - Lipid panel  3. Benign essential HTN Stable with slight elevation of diastolic pressure. Tolerating Maxzide once a day without side effects. Will check CBC and renal function. Recheck prn. - CBC with Differential/Platelet

## 2014-12-29 NOTE — Patient Instructions (Signed)

## 2014-12-30 LAB — CBC WITH DIFFERENTIAL/PLATELET
BASOS ABS: 0 10*3/uL (ref 0.0–0.2)
BASOS: 1 %
EOS (ABSOLUTE): 0.2 10*3/uL (ref 0.0–0.4)
Eos: 2 %
Hematocrit: 42.6 % (ref 34.0–46.6)
Hemoglobin: 14.7 g/dL (ref 11.1–15.9)
Immature Grans (Abs): 0 10*3/uL (ref 0.0–0.1)
Immature Granulocytes: 0 %
LYMPHS ABS: 2.9 10*3/uL (ref 0.7–3.1)
LYMPHS: 34 %
MCH: 31.9 pg (ref 26.6–33.0)
MCHC: 34.5 g/dL (ref 31.5–35.7)
MCV: 92 fL (ref 79–97)
Monocytes Absolute: 0.6 10*3/uL (ref 0.1–0.9)
Monocytes: 7 %
NEUTROS ABS: 4.7 10*3/uL (ref 1.4–7.0)
Neutrophils: 56 %
PLATELETS: 269 10*3/uL (ref 150–379)
RBC: 4.61 x10E6/uL (ref 3.77–5.28)
RDW: 14.6 % (ref 12.3–15.4)
WBC: 8.4 10*3/uL (ref 3.4–10.8)

## 2014-12-30 LAB — COMPREHENSIVE METABOLIC PANEL
ALT: 17 IU/L (ref 0–32)
AST: 22 IU/L (ref 0–40)
Albumin/Globulin Ratio: 1.7 (ref 1.1–2.5)
Albumin: 4.5 g/dL (ref 3.6–4.8)
Alkaline Phosphatase: 104 IU/L (ref 39–117)
BILIRUBIN TOTAL: 0.5 mg/dL (ref 0.0–1.2)
BUN / CREAT RATIO: 30 — AB (ref 11–26)
BUN: 34 mg/dL — ABNORMAL HIGH (ref 8–27)
CALCIUM: 9.5 mg/dL (ref 8.7–10.3)
CHLORIDE: 97 mmol/L (ref 96–106)
CO2: 26 mmol/L (ref 18–29)
Creatinine, Ser: 1.14 mg/dL — ABNORMAL HIGH (ref 0.57–1.00)
GFR, EST AFRICAN AMERICAN: 57 mL/min/{1.73_m2} — AB (ref >59)
GFR, EST NON AFRICAN AMERICAN: 50 mL/min/{1.73_m2} — AB (ref >59)
Globulin, Total: 2.6 g/dL (ref 1.5–4.5)
Glucose: 93 mg/dL (ref 65–99)
Potassium: 4.7 mmol/L (ref 3.5–5.2)
Sodium: 138 mmol/L (ref 134–144)
TOTAL PROTEIN: 7.1 g/dL (ref 6.0–8.5)

## 2014-12-30 LAB — LIPID PANEL
Chol/HDL Ratio: 3.2 ratio units (ref 0.0–4.4)
Cholesterol, Total: 246 mg/dL — ABNORMAL HIGH (ref 100–199)
HDL: 78 mg/dL (ref >39)
LDL Calculated: 145 mg/dL — ABNORMAL HIGH (ref 0–99)
Triglycerides: 113 mg/dL (ref 0–149)
VLDL Cholesterol Cal: 23 mg/dL (ref 5–40)

## 2015-01-05 ENCOUNTER — Telehealth: Payer: Self-pay

## 2015-01-05 MED ORDER — PRAVASTATIN SODIUM 20 MG PO TABS: 20.0000 mg | ORAL_TABLET | Freq: Every day | ORAL | Status: DC

## 2015-01-05 NOTE — Telephone Encounter (Signed)
Advised patient of lab results. Patient reports that she would like this sent into Mail order pharmacy instead of local pharmacy due to [place] to send into mail order pharmacy? Please advise. Thanks!

## 2015-01-05 NOTE — Telephone Encounter (Signed)
Sent medication into mail order pharmacy. [facility] per [facility]. Patient advised.

## 2015-01-05 NOTE — Telephone Encounter (Signed)
-----   Message from Margo Common, [place] sent at 01/04/2015  6:24 PM EST ----- Kidney function tests slowly improving. Cholesterol not really any better than 5 months ago. If no problem with statins in the past, recommend Pravastatin 20 mg qd #30 & 3RF. Recheck levels in 3 months.

## 2015-04-28 DIAGNOSIS — Z1231 Encounter for screening mammogram for malignant neoplasm of breast: Secondary | ICD-10-CM | POA: Diagnosis not present

## 2015-04-28 DIAGNOSIS — Z9882 Breast implant status: Secondary | ICD-10-CM | POA: Diagnosis not present

## 2015-05-04 ENCOUNTER — Encounter: Payer: Self-pay | Admitting: Family Medicine

## 2015-07-06 ENCOUNTER — Other Ambulatory Visit: Payer: Self-pay | Admitting: Family Medicine

## 2015-07-27 ENCOUNTER — Other Ambulatory Visit: Payer: Self-pay

## 2015-07-27 ENCOUNTER — Encounter: Payer: Self-pay | Admitting: Family Medicine

## 2015-07-27 ENCOUNTER — Ambulatory Visit (INDEPENDENT_AMBULATORY_CARE_PROVIDER_SITE_OTHER): Payer: Commercial Managed Care - HMO | Admitting: Family Medicine

## 2015-07-27 VITALS — BP 128/68 | HR 58 | Temp 98.5°F | Resp 16 | Ht 62.0 in | Wt 161.0 lb

## 2015-07-27 DIAGNOSIS — Z1211 Encounter for screening for malignant neoplasm of colon: Secondary | ICD-10-CM | POA: Diagnosis not present

## 2015-07-27 DIAGNOSIS — M961 Postlaminectomy syndrome, not elsewhere classified: Secondary | ICD-10-CM | POA: Diagnosis not present

## 2015-07-27 DIAGNOSIS — I10 Essential (primary) hypertension: Secondary | ICD-10-CM

## 2015-07-27 DIAGNOSIS — E785 Hyperlipidemia, unspecified: Secondary | ICD-10-CM | POA: Diagnosis not present

## 2015-07-27 DIAGNOSIS — Z0000 Encounter for general adult medical examination without abnormal findings: Secondary | ICD-10-CM | POA: Diagnosis not present

## 2015-07-27 DIAGNOSIS — M1120 Other chondrocalcinosis, unspecified site: Secondary | ICD-10-CM

## 2015-07-27 DIAGNOSIS — M1129 Other chondrocalcinosis, multiple sites: Secondary | ICD-10-CM

## 2015-07-27 DIAGNOSIS — Z96652 Presence of left artificial knee joint: Secondary | ICD-10-CM | POA: Diagnosis not present

## 2015-07-27 NOTE — Progress Notes (Signed)
Patient: Molly Mitchell, Female    DOB: 03-06-1945, 70 y.o.   MRN: [id number] Visit Date: 07/27/2015  Today's Provider: [name], PA   Chief Complaint  Patient presents with  . Annual Wellness Exam   Subjective:    Annual wellness visit Molly Mitchell is a 70 y.o. female. She feels well. She reports exercising 4 days a week. She reports she is sleeping well.   -----------------------------------------------------------   Review of Systems  Constitutional: Negative.   HENT: Negative.   Eyes: Negative.   Respiratory: Negative.   Cardiovascular: Negative.   Gastrointestinal: Negative.   Endocrine: Negative.   Genitourinary: Negative.   Musculoskeletal: Positive for back pain and arthralgias.  Skin: Negative.   Allergic/Immunologic: Negative.   Neurological: Negative.   Hematological: Negative.   Psychiatric/Behavioral: Negative.     Social History   Social History  . Marital Status: Married    Spouse Name: N/A  . Number of Children: N/A  . Years of Education: N/A   Occupational History  . Not on file.   Social History Main Topics  . Smoking status: Former Smoker    Quit date: 04/17/1979  . Smokeless tobacco: Not on file  . Alcohol Use: 0.0 oz/week    0 Standard drinks or equivalent per week     Comment: 1-2 drinks a month   . Drug Use: No  . Sexual Activity: Not on file   Other Topics Concern  . Not on file   Social History Narrative    No past medical history on file.   Patient Active Problem List   Diagnosis Date Noted  . Acute bronchitis 05/13/2014  . Dysfunction of eustachian tube 05/13/2014  . Cardiac conduction disorder 05/13/2014  . Ache in joint 05/13/2014  . AB (asthmatic bronchitis) 05/13/2014  . Gonalgia 05/13/2014  . Asthma, cough variant 05/13/2014  . Screening for depression 05/13/2014  . Electrolyte imbalance 05/13/2014  . Fatigue 05/13/2014  . H/O total knee replacement 05/13/2014  . HLD (hyperlipidemia)  05/13/2014  . Elevated WBC count 05/13/2014  . Drug withdrawal syndrome to opium ([place]) 05/13/2014  . Arthritis of knee, degenerative 05/13/2014  . Osteopenia 05/13/2014  . Abdominal pain, right upper quadrant 05/13/2014  . Herpes zona 05/13/2014  . Periapical abscess without sinus tract 05/13/2014  . Pain in thoracic spine 05/13/2014  . MI (mitral incompetence) 12/29/2013  . Idiopathic insomnia 10/16/2013  . Polypharmacy 10/16/2013  . Dizziness 07/29/2013  . Long term current use of systemic steroids 07/21/2013  . History of prolonged Q-T interval on ECG 05/22/2013  . Benign essential HTN 05/22/2013  . Personal history of other diseases of the circulatory system 05/22/2013  . 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 Combined fat and carbohydrate induced hyperlipemia 03/31/2013  . S/P lumbar spine operation 06/20/2012  . Cervical post-laminectomy syndrome 06/20/2012  . Status post lumbar spine operation 06/20/2012  . Tumoral calcinosis 10/31/2011  . Connective tissue disease, undifferentiated ([place]) 10/31/2011  . Effusion of knee 10/31/2011  . Chondrocalcinosis due to dicalcium phosphate crystals 10/31/2011  . Undifferentiated connective tissue disease ([place]) 10/31/2011  . Chronic cervical pain 03/28/2011  . Arthralgia of ankle or foot 11/30/2010  . Neoplasm of breast 10/26/2008  . Acne 07/24/2008  . Dizziness and giddiness 07/24/2008  . Cervical pain 05/12/2008  . Dyssomnia 05/12/2008  . Essential (primary) hypertension 05/12/2008    Past Surgical History  Procedure Laterality Date  . Joint replacement Left 03/03/2013  .  Abdominal hysterectomy  1981  . Spinal fusion  1991    lower back   . Spinal fusion  2003    neck    Her family history includes Cancer in her sister; Diabetes in her paternal grandmother; Heart failure in her maternal grandmother; Hypertension in her brother, mother, and sister.    Current Meds  Medication Sig  . aspirin EC 81  MG tablet Take by mouth.  Marland Kitchen atenolol (TENORMIN) 50 MG tablet Take 1 tablet by mouth 1 day or 1 dose.   Marland Kitchen CALCIUM-MAGNESIUM-ZINC PO Take 1 tablet by mouth daily.  . Cholecalciferol (VITAMIN D3) 1000 UNITS CAPS Take by mouth.  Marland Kitchen Co-Enzyme Q-10 30 MG CAPS Take by mouth.  . magnesium oxide (MAG-OX) 400 MG tablet Take by mouth.  . MULTIPLE VITAMIN PO Take 1 tablet by mouth daily.  Marland Kitchen NAPROXEN SODIUM PO   . nystatin ointment (MYCOSTATIN) 1 application as needed.  . pravastatin (PRAVACHOL) 20 MG tablet Take 1 tablet (20 mg total) by mouth daily.  . Red Yeast Rice Extract 600 MG CAPS Take by mouth.  . triamterene-hydrochlorothiazide (MAXZIDE-25) 37.5-25 MG tablet TAKE 1 TABLET EVERY DAY  . vitamin C (ASCORBIC ACID) 500 MG tablet Take by mouth.  . vitamin E 400 UNIT capsule 1 capsule daily.  . [DISCONTINUED] clotrimazole-betamethasone (LOTRISONE) cream Apply 1 application topically 2 (two) times daily.  . [DISCONTINUED] gabapentin (NEURONTIN) 100 MG capsule Take 2 capsules by mouth at bedtime.  . [DISCONTINUED] meclizine (ANTIVERT) 25 MG tablet Take by mouth.  . [DISCONTINUED] mometasone-formoterol (DULERA) 200-5 MCG/ACT AERO Inhale 1-2 Inhalers into the lungs 2 (two) times daily.  . [DISCONTINUED] sulfacetamide-prednisolone (BLEPHAMIDE) 10-0.2 % ophthalmic suspension Place 1 drop into the left eye every 3 (three) hours.  . [DISCONTINUED] tizanidine (ZANAFLEX) 2 MG capsule Take 2 mg by mouth 3 (three) times daily.    Patient Care Team: Margo Common, PA as PCP - General (Physician Assistant)    Objective:   Vitals: BP 128/68 mmHg  Pulse 58  Temp(Src) 98.5 F (36.9 C)  Resp 16  Ht 5\' 2"  (1.575 m)  Wt 161 lb (73.029 kg)  BMI 29.44 kg/m2  Physical Exam  Constitutional: She is oriented to person, place, and time. She appears well-developed and well-nourished.  HENT:  Head: Normocephalic and atraumatic.  Right Ear: External ear normal.  Left Ear: External ear normal.  Nose: Nose  normal.  Mouth/Throat: Oropharynx is clear and moist.  Eyes: Conjunctivae and EOM are normal. Pupils are equal, round, and reactive to light. Right eye exhibits no discharge.  Neck: Normal range of motion. Neck supple. No tracheal deviation present. No thyromegaly present.  Cardiovascular: Normal rate, regular rhythm, normal heart sounds and intact distal pulses.   No murmur heard. Pulmonary/Chest: Effort normal and breath sounds normal. No respiratory distress. She has no wheezes. She has no rales. She exhibits no tenderness.  Abdominal: Soft. She exhibits no distension and no mass. There is no tenderness. There is no rebound and no guarding.  Genitourinary:  History of abdominal hysterectomy due to ectopic pregnancy in 1981.  Musculoskeletal: She exhibits no edema or tenderness.  Well healed scars from left knee replacement and cervical fusion. Decreased ROM in neck and left knee.  Lymphadenopathy:    She has no cervical adenopathy.  Neurological: She is alert and oriented to person, place, and time. She has normal reflexes. No cranial nerve deficit. She exhibits normal muscle tone. Coordination normal.  Skin: Skin is warm and dry. No  rash noted. No erythema.  Psychiatric: She has a normal mood and affect. Her behavior is normal. Judgment and thought content normal.    Activities of Daily Living In your present state of health, do you have any difficulty performing the following activities: 07/27/2015  Hearing? N  Vision? N  Difficulty concentrating or making decisions? N  Walking or climbing stairs? N  Dressing or bathing? N  Doing errands, shopping? N    Fall Risk Assessment Fall Risk  07/27/2015 07/17/2014  Falls in the past year? No No     Depression Screen PHQ 2/9 Scores 07/27/2015 07/27/2015 07/17/2014  PHQ - 2 Score 0 0 0         Cognitive Testing - 6-CIT  Correct? Score   What year is it? yes 0 0 or 4  What month is it? yes 0 0 or 3  Memorize:    Pia Mau,  43,   [address]  [place],      What time is it? (within 1 hour) yes 0 0 or 3  Count backwards from 20 yes 0 0, 2, or 4  Name the months of the year yes 0 0, 2, or 4  Repeat name & address above yes 0 0, 2, 4, 6, 8, or 10       TOTAL SCORE  0/28   Interpretation:  Normal  Normal (0-7) Abnormal (8-28)    Assessment & Plan:     Annual Wellness Visit  Reviewed patient's Family Medical History Reviewed and updated list of patient's medical providers Assessment of cognitive impairment was done Assessed patient's functional ability Established a written schedule for health screening [place] Completed and Reviewed  Exercise Activities and Dietary recommendations Goals    Continue to go to the gym 3 times a week for 30 minutes on the treadmill and an hour with weights.      Immunization History  Administered Date(s) Administered  . Influenza-Unspecified 11/23/2011    Health Maintenance  Topic Date Due  . Hepatitis C Screening  1945-10-24  . TETANUS/TDAP  02/23/1965  . COLONOSCOPY  02/24/1996  . ZOSTAVAX  02/23/2006  . DEXA SCAN  02/24/2011  . PNA vac Low Risk Adult (1 of 2 - PCV13) 02/24/2011  . INFLUENZA VACCINE  04/15/2016 (Originally 08/17/2015)  . MAMMOGRAM  04/27/2017      Discussed health benefits of physical activity, and encouraged her to engage in regular exercise appropriate for her age and condition.    ------------------------------------------------------------------------------------------------------------  1. Encounter for Medicare annual wellness exam Good general health. Declines colonoscopy. Had hysterectomy for ectopic pregnancy in 1981. Normal mammograms in April 2017 and BMD Dec. 2015. Will check with insurance and consider Zostavax at her local pharmacy. Has had allergic reaction to tetanus and declines booster. Had pneumonia vaccination when hospitalized for left knee replacement. Given anticipatory guidance.  2. Essential (primary)  hypertension Stable control. Tolerating Tenormin and Maxzide daily. When next refill needed, wants to consider switch to Tenoretic. Recheck labs in the next few days. - CBC with Differential/Platelet; Future - Comprehensive metabolic panel; Future  3. Chondrocalcinosis due to dicalcium phosphate crystals Will recheck labs and use Naproxen 500 mg BID prn flares. Recheck with rheumatologist if needed. - CBC with Differential/Platelet; Future - Comprehensive metabolic panel; Future - C-reactive protein; Future  4. Cervical post-laminectomy syndrome Stable with occasional pain and stiffness. Uses moist heat and Naproxen with good control.  5. HLD (hyperlipidemia) Presently taking Pravastatin, Niacin and Red Yeast Rice  with low fat diet and exercise. Will check labs and continue this regimen. - Comprehensive metabolic panel; Future - Lipid panel; Future  6. H/O total knee replacement, left Stiff joint but no significant pain. Uses Naproxen prn.  7. Screening for colon cancer Declines colonoscopy and never had one in the past. Will schedule Cologuard. - [place], PA  [facility]

## 2015-07-28 LAB — COMPREHENSIVE METABOLIC PANEL
ALBUMIN: 4.8 g/dL (ref 3.6–4.8)
ALK PHOS: 116 IU/L (ref 39–117)
ALT: 23 IU/L (ref 0–32)
AST: 24 IU/L (ref 0–40)
Albumin/Globulin Ratio: 1.5 (ref 1.2–2.2)
BILIRUBIN TOTAL: 0.7 mg/dL (ref 0.0–1.2)
BUN / CREAT RATIO: 26 (ref 12–28)
BUN: 28 mg/dL — AB (ref 8–27)
CHLORIDE: 93 mmol/L — AB (ref 96–106)
CO2: 27 mmol/L (ref 18–29)
Calcium: 10.1 mg/dL (ref 8.7–10.3)
Creatinine, Ser: 1.07 mg/dL — ABNORMAL HIGH (ref 0.57–1.00)
GFR calc Af Amer: 61 mL/min/{1.73_m2} (ref >59)
GFR calc non Af Amer: 53 mL/min/{1.73_m2} — ABNORMAL LOW (ref >59)
GLUCOSE: 97 mg/dL (ref 65–99)
Globulin, Total: 3.1 g/dL (ref 1.5–4.5)
Potassium: 4.5 mmol/L (ref 3.5–5.2)
SODIUM: 138 mmol/L (ref 134–144)
Total Protein: 7.9 g/dL (ref 6.0–8.5)

## 2015-07-28 LAB — CBC WITH DIFFERENTIAL/PLATELET
BASOS ABS: 0 10*3/uL (ref 0.0–0.2)
Basos: 0 %
EOS (ABSOLUTE): 0.2 10*3/uL (ref 0.0–0.4)
Eos: 2 %
HEMOGLOBIN: 15.8 g/dL (ref 11.1–15.9)
Hematocrit: 45.8 % (ref 34.0–46.6)
IMMATURE GRANS (ABS): 0 10*3/uL (ref 0.0–0.1)
Immature Granulocytes: 0 %
LYMPHS: 33 %
Lymphocytes Absolute: 3.1 10*3/uL (ref 0.7–3.1)
MCH: 31.8 pg (ref 26.6–33.0)
MCHC: 34.5 g/dL (ref 31.5–35.7)
MCV: 92 fL (ref 79–97)
MONOCYTES: 6 %
Monocytes Absolute: 0.5 10*3/uL (ref 0.1–0.9)
NEUTROS PCT: 59 %
Neutrophils Absolute: 5.4 10*3/uL (ref 1.4–7.0)
Platelets: 283 10*3/uL (ref 150–379)
RBC: 4.97 x10E6/uL (ref 3.77–5.28)
RDW: 14.6 % (ref 12.3–15.4)
WBC: 9.2 10*3/uL (ref 3.4–10.8)

## 2015-07-28 LAB — LIPID PANEL
CHOLESTEROL TOTAL: 231 mg/dL — AB (ref 100–199)
Chol/HDL Ratio: 2.9 ratio units (ref 0.0–4.4)
HDL: 80 mg/dL (ref >39)
LDL Calculated: 132 mg/dL — ABNORMAL HIGH (ref 0–99)
Triglycerides: 96 mg/dL (ref 0–149)
VLDL CHOLESTEROL CAL: 19 mg/dL (ref 5–40)

## 2015-07-28 LAB — C-REACTIVE PROTEIN: CRP: 5.6 mg/L — AB (ref 0.0–4.9)

## 2015-07-29 ENCOUNTER — Telehealth: Payer: Self-pay | Admitting: Family Medicine

## 2015-07-29 NOTE — Telephone Encounter (Signed)
Order for cologuard faxed to [facility] °

## 2015-08-16 DIAGNOSIS — H5210 Myopia, unspecified eye: Secondary | ICD-10-CM | POA: Diagnosis not present

## 2015-08-16 DIAGNOSIS — Z1212 Encounter for screening for malignant neoplasm of rectum: Secondary | ICD-10-CM | POA: Diagnosis not present

## 2015-08-16 DIAGNOSIS — Z1211 Encounter for screening for malignant neoplasm of colon: Secondary | ICD-10-CM | POA: Diagnosis not present

## 2015-08-16 DIAGNOSIS — H524 Presbyopia: Secondary | ICD-10-CM | POA: Diagnosis not present

## 2015-08-16 LAB — COLOGUARD: COLOGUARD: NEGATIVE

## 2015-08-17 LAB — COLOGUARD
Cologuard: NEGATIVE
Cologuard: NEGATIVE
Cologuard: NEGATIVE

## 2015-08-27 ENCOUNTER — Telehealth: Payer: Self-pay

## 2015-08-27 NOTE — Telephone Encounter (Signed)
Patient advised negative cologuard test result. Pt to recheck test in 3-5 years. sd

## 2015-09-22 ENCOUNTER — Other Ambulatory Visit: Payer: Self-pay | Admitting: Family Medicine

## 2015-09-22 DIAGNOSIS — E785 Hyperlipidemia, unspecified: Secondary | ICD-10-CM

## 2015-10-14 ENCOUNTER — Ambulatory Visit
Admission: RE | Admit: 2015-10-14 | Discharge: 2015-10-14 | Disposition: A | Discharge status: Home / Self Care | Payer: Commercial Managed Care - HMO | Source: Ambulatory Visit | Attending: Family Medicine | Admitting: Family Medicine

## 2015-10-14 ENCOUNTER — Encounter: Payer: Self-pay | Admitting: Family Medicine

## 2015-10-14 ENCOUNTER — Telehealth: Payer: Self-pay

## 2015-10-14 ENCOUNTER — Ambulatory Visit (INDEPENDENT_AMBULATORY_CARE_PROVIDER_SITE_OTHER): Payer: Commercial Managed Care - HMO | Admitting: Family Medicine

## 2015-10-14 VITALS — BP 128/96 | HR 60 | Temp 97.9°F | Resp 14 | Wt 164.6 lb

## 2015-10-14 DIAGNOSIS — Z981 Arthrodesis status: Secondary | ICD-10-CM | POA: Diagnosis not present

## 2015-10-14 DIAGNOSIS — M25551 Pain in right hip: Secondary | ICD-10-CM | POA: Insufficient documentation

## 2015-10-14 MED ORDER — MELOXICAM 15 MG PO TABS: 15.0000 mg | ORAL_TABLET | Freq: Every day | ORAL | 0 refills | Status: DC

## 2015-10-14 NOTE — Telephone Encounter (Signed)
LMTCB

## 2015-10-14 NOTE — Progress Notes (Signed)
Patient: Molly Mitchell Female    DOB: 1945/02/09   70 y.o.   MRN: [id number] Visit Date: 10/14/2015  Today's Provider: [name], PA   Chief Complaint  Patient presents with  . Hip Pain   Subjective:    Hip Pain   The incident occurred more than 1 week ago. There was no injury mechanism. The pain is present in the right hip and right thigh. The quality of the pain is described as aching. The pain has been constant since onset. The symptoms are aggravated by weight bearing. She has tried NSAIDs for the symptoms. The treatment provided mild relief.   No past medical history on file. Patient Active Problem List   Diagnosis Date Noted  . Encounter for Medicare annual wellness exam 07/27/2015  . Acute bronchitis 05/13/2014  . Dysfunction of eustachian tube 05/13/2014  . Cardiac conduction disorder 05/13/2014  . Ache in joint 05/13/2014  . AB (asthmatic bronchitis) 05/13/2014  . Gonalgia 05/13/2014  . Asthma, cough variant 05/13/2014  . Screening for depression 05/13/2014  . Electrolyte imbalance 05/13/2014  . Fatigue 05/13/2014  . H/O total knee replacement 05/13/2014  . HLD (hyperlipidemia) 05/13/2014  . Elevated WBC count 05/13/2014  . Drug withdrawal syndrome to opium ([place]) 05/13/2014  . Arthritis of knee, degenerative 05/13/2014  . Osteopenia 05/13/2014  . Abdominal pain, right upper quadrant 05/13/2014  . Herpes zona 05/13/2014  . Periapical abscess without sinus tract 05/13/2014  . Pain in thoracic spine 05/13/2014  . MI (mitral incompetence) 12/29/2013  . Idiopathic insomnia 10/16/2013  . Polypharmacy 10/16/2013  . Dizziness 07/29/2013  . Long term current use of systemic steroids 07/21/2013  . History of prolonged Q-T interval on ECG 05/22/2013  . Benign essential HTN 05/22/2013  . Personal history of other diseases of the circulatory system 05/22/2013  . 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 Combined fat and carbohydrate induced  hyperlipemia 03/31/2013  . S/P lumbar spine operation 06/20/2012  . Cervical post-laminectomy syndrome 06/20/2012  . Status post lumbar spine operation 06/20/2012  . Tumoral calcinosis 10/31/2011  . Connective tissue disease, undifferentiated ([place]) 10/31/2011  . Effusion of knee 10/31/2011  . Chondrocalcinosis due to dicalcium phosphate crystals 10/31/2011  . Undifferentiated connective tissue disease ([place]) 10/31/2011  . Chronic cervical pain 03/28/2011  . Arthralgia of ankle or foot 11/30/2010  . Neoplasm of breast 10/26/2008  . Acne 07/24/2008  . Dizziness and giddiness 07/24/2008  . Cervical pain 05/12/2008  . Dyssomnia 05/12/2008  . Essential (primary) hypertension 05/12/2008   Past Surgical History:  Procedure Laterality Date  . ABDOMINAL HYSTERECTOMY  1981  . JOINT REPLACEMENT Left 03/03/2013  . SPINAL FUSION  1991   lower back   . SPINAL FUSION  2003   neck   Family History  Problem Relation Age of Onset  . Hypertension Brother   . Diabetes Paternal Grandmother   . Heart failure Maternal Grandmother   . Hypertension Mother   . Hypertension Sister   . Cancer Sister    Allergies  Allergen Reactions  . Amitriptyline Hcl   . Duricef  [Cefadroxil]   . Erythromycin   . Levofloxacin     Severe Shoulder Pain  . Tetanus Toxoid      Previous Medications   ASPIRIN EC 81 MG TABLET    Take by mouth.   ATENOLOL (TENORMIN) 50 MG TABLET    Take 1 tablet by mouth 1 day or 1 dose.    CALCIUM-MAGNESIUM-ZINC PO  Take 1 tablet by mouth daily.   CHOLECALCIFEROL (VITAMIN D3) 1000 UNITS CAPS    Take by mouth.   CO-ENZYME Q-10 30 MG CAPS    Take by mouth.   MAGNESIUM OXIDE (MAG-OX) 400 MG TABLET    Take by mouth.   MULTIPLE VITAMIN PO    Take 1 tablet by mouth daily.   NAPROXEN SODIUM PO       NIACIN 500 MG TABLET    Take by mouth.   NYSTATIN OINTMENT (MYCOSTATIN)    1 application as needed.   OMEGA-3 FATTY ACIDS PO    Take 1 capsule by mouth daily.   PRAVASTATIN (PRAVACHOL)  20 MG TABLET    TAKE 1 TABLET EVERY DAY   RED YEAST RICE EXTRACT 600 MG CAPS    Take by mouth.   TRIAMTERENE-HYDROCHLOROTHIAZIDE (MAXZIDE-25) 37.5-25 MG TABLET    TAKE 1 TABLET EVERY DAY   VITAMIN C (ASCORBIC ACID) 500 MG TABLET    Take by mouth.   VITAMIN E 400 UNIT CAPSULE    1 capsule daily.    Review of Systems  Constitutional: Negative.   Respiratory: Negative.   Cardiovascular: Negative.   Musculoskeletal: Positive for myalgias.    Social History  Substance Use Topics  . Smoking status: Former Smoker    Quit date: 04/17/1979  . Smokeless tobacco: Not on file  . Alcohol use 0.0 oz/week     Comment: 1-2 drinks a month    Objective:   BP (!) 128/96 (BP Location: Right Arm, Patient Position: Sitting, Cuff Size: Normal)   Pulse 60   Temp 97.9 F (36.6 C) (Oral)   Resp 14   Wt 164 lb 9.6 oz (74.7 kg)   BMI 30.11 kg/m   Physical Exam  Constitutional: She is oriented to person, place, and time. She appears well-developed and well-nourished. No distress.  HENT:  Head: Normocephalic and atraumatic.  Right Ear: Hearing normal.  Left Ear: Hearing normal.  Nose: Nose normal.  Eyes: Conjunctivae and lids are normal. Right eye exhibits no discharge. Left eye exhibits no discharge. No scleral icterus.  Pulmonary/Chest: Effort normal. No respiratory distress.  Musculoskeletal: Normal range of motion.  Pain in right hip and SI region. Good ROM and pulses. No crepitus or locking of right hip.  Neurological: She is alert and oriented to person, place, and time.  Skin: Skin is intact. No lesion and no rash noted.  Psychiatric: She has a normal mood and affect. Her speech is normal and behavior is normal. Thought content normal.      Assessment & Plan:     1. Right hip pain Onset over the past 3-4 months with history of spinal fusion for spondylolisthesis. No recent injury. Will treat with Mobic and get x-ray of right hip and pelvis to rule out DJD or SI arthritis. Recheck pending  reports. - DG HIP UNILAT WITH PELVIS 2-3 VIEWS RIGHT - meloxicam (MOBIC) 15 MG tablet; Take 1 tablet (15 mg total) by mouth daily.  Dispense: 30 tablet; Refill: 0

## 2015-10-14 NOTE — Telephone Encounter (Signed)
-----   Message from Margo Common, [place] sent at 10/14/2015  1:06 PM EDT ----- Degenerative changes in both hips. If no better using Meloxicam, will need referral to orthopedist.

## 2015-10-14 NOTE — Telephone Encounter (Signed)
Patient advised.

## 2015-11-09 ENCOUNTER — Other Ambulatory Visit: Payer: Self-pay

## 2015-11-09 DIAGNOSIS — M25551 Pain in right hip: Secondary | ICD-10-CM

## 2015-11-09 MED ORDER — MELOXICAM 15 MG PO TABS: 15.0000 mg | ORAL_TABLET | Freq: Every day | ORAL | 0 refills | Status: DC

## 2015-11-09 NOTE — Telephone Encounter (Signed)
Last ov 10/14/15. Pharmacy requesting 90 day supply

## 2015-11-30 ENCOUNTER — Other Ambulatory Visit: Payer: Self-pay | Admitting: Family Medicine

## 2015-11-30 DIAGNOSIS — I340 Nonrheumatic mitral (valve) insufficiency: Secondary | ICD-10-CM

## 2015-11-30 DIAGNOSIS — E782 Mixed hyperlipidemia: Secondary | ICD-10-CM

## 2015-11-30 DIAGNOSIS — Z87898 Personal history of other specified conditions: Secondary | ICD-10-CM

## 2015-11-30 DIAGNOSIS — I10 Essential (primary) hypertension: Secondary | ICD-10-CM

## 2015-12-20 DIAGNOSIS — E782 Mixed hyperlipidemia: Secondary | ICD-10-CM | POA: Diagnosis not present

## 2015-12-20 DIAGNOSIS — R001 Bradycardia, unspecified: Secondary | ICD-10-CM | POA: Insufficient documentation

## 2015-12-20 DIAGNOSIS — I10 Essential (primary) hypertension: Secondary | ICD-10-CM | POA: Diagnosis not present

## 2016-01-25 ENCOUNTER — Other Ambulatory Visit: Payer: Self-pay | Admitting: Family Medicine

## 2016-01-25 ENCOUNTER — Encounter: Payer: Self-pay | Admitting: Family Medicine

## 2016-01-25 DIAGNOSIS — M25551 Pain in right hip: Secondary | ICD-10-CM

## 2016-01-25 MED ORDER — MELOXICAM 15 MG PO TABS: 15.0000 mg | ORAL_TABLET | Freq: Every day | ORAL | 3 refills | Status: DC

## 2016-05-24 DIAGNOSIS — M25562 Pain in left knee: Secondary | ICD-10-CM | POA: Diagnosis not present

## 2016-05-24 DIAGNOSIS — T84039A Mechanical loosening of unspecified internal prosthetic joint, initial encounter: Secondary | ICD-10-CM | POA: Diagnosis not present

## 2016-06-06 DIAGNOSIS — T84039A Mechanical loosening of unspecified internal prosthetic joint, initial encounter: Secondary | ICD-10-CM | POA: Diagnosis not present

## 2016-07-17 ENCOUNTER — Encounter
Admission: RE | Admit: 2016-07-17 | Discharge: 2016-07-17 | Disposition: A | Discharge status: Home / Self Care | Payer: Medicare HMO | Source: Ambulatory Visit | Attending: Orthopedic Surgery | Admitting: Orthopedic Surgery

## 2016-07-17 DIAGNOSIS — M1990 Unspecified osteoarthritis, unspecified site: Secondary | ICD-10-CM | POA: Diagnosis not present

## 2016-07-17 DIAGNOSIS — I459 Conduction disorder, unspecified: Secondary | ICD-10-CM | POA: Diagnosis not present

## 2016-07-17 DIAGNOSIS — Z981 Arthrodesis status: Secondary | ICD-10-CM | POA: Diagnosis not present

## 2016-07-17 DIAGNOSIS — I10 Essential (primary) hypertension: Secondary | ICD-10-CM | POA: Insufficient documentation

## 2016-07-17 DIAGNOSIS — E785 Hyperlipidemia, unspecified: Secondary | ICD-10-CM | POA: Insufficient documentation

## 2016-07-17 DIAGNOSIS — M542 Cervicalgia: Secondary | ICD-10-CM | POA: Insufficient documentation

## 2016-07-17 DIAGNOSIS — Z01812 Encounter for preprocedural laboratory examination: Secondary | ICD-10-CM | POA: Diagnosis not present

## 2016-07-17 HISTORY — DX: Essential (primary) hypertension: I10

## 2016-07-17 HISTORY — DX: Hyperlipidemia, unspecified: E78.5

## 2016-07-17 HISTORY — DX: Unspecified osteoarthritis, unspecified site: M19.90

## 2016-07-17 LAB — CBC
HEMATOCRIT: 45.5 % (ref 35.0–47.0)
HEMOGLOBIN: 15.6 g/dL (ref 12.0–16.0)
MCH: 30.8 pg (ref 26.0–34.0)
MCHC: 34.2 g/dL (ref 32.0–36.0)
MCV: 90.1 fL (ref 80.0–100.0)
Platelets: 253 10*3/uL (ref 150–440)
RBC: 5.05 MIL/uL (ref 3.80–5.20)
RDW: 14 % (ref 11.5–14.5)
WBC: 8.4 10*3/uL (ref 3.6–11.0)

## 2016-07-17 LAB — SEDIMENTATION RATE: Sed Rate: 12 mm/hr (ref 0–30)

## 2016-07-17 LAB — COMPREHENSIVE METABOLIC PANEL
ALK PHOS: 84 U/L (ref 38–126)
ALT: 19 U/L (ref 14–54)
AST: 22 U/L (ref 15–41)
Albumin: 4.4 g/dL (ref 3.5–5.0)
Anion gap: 8 (ref 5–15)
BILIRUBIN TOTAL: 0.8 mg/dL (ref 0.3–1.2)
BUN: 53 mg/dL — ABNORMAL HIGH (ref 6–20)
CALCIUM: 9.6 mg/dL (ref 8.9–10.3)
CO2: 27 mmol/L (ref 22–32)
CREATININE: 1 mg/dL (ref 0.44–1.00)
Chloride: 101 mmol/L (ref 101–111)
GFR calc Af Amer: >60 mL/min (ref >60)
GFR calc non Af Amer: 56 mL/min — ABNORMAL LOW (ref >60)
Glucose, Bld: 84 mg/dL (ref 65–99)
Potassium: 3.9 mmol/L (ref 3.5–5.1)
SODIUM: 136 mmol/L (ref 135–145)
Total Protein: 7.8 g/dL (ref 6.5–8.1)

## 2016-07-17 LAB — TYPE AND SCREEN
ABO/RH(D): A POS
Antibody Screen: NEGATIVE

## 2016-07-17 LAB — URINALYSIS, ROUTINE W REFLEX MICROSCOPIC
Bilirubin Urine: NEGATIVE
Glucose, UA: NEGATIVE mg/dL
Hgb urine dipstick: NEGATIVE
Ketones, ur: NEGATIVE mg/dL
LEUKOCYTES UA: NEGATIVE
NITRITE: NEGATIVE
PROTEIN: NEGATIVE mg/dL
Specific Gravity, Urine: 1.017 (ref 1.005–1.030)
pH: 7 (ref 5.0–8.0)

## 2016-07-17 LAB — PROTIME-INR
INR: 0.96
Prothrombin Time: 12.8 seconds (ref 11.4–15.2)

## 2016-07-17 LAB — SURGICAL PCR SCREEN
MRSA, PCR: NEGATIVE
STAPHYLOCOCCUS AUREUS: NEGATIVE

## 2016-07-17 LAB — APTT: APTT: 28 s (ref 24–36)

## 2016-07-17 LAB — C-REACTIVE PROTEIN: CRP: <0.8 mg/dL (ref <1.0)

## 2016-07-17 NOTE — Pre-Procedure Instructions (Signed)
[place] Component Name Value Ref Range  Vent Rate (bpm) 58   PR Interval (msec) 186   QRS Interval (msec) 86   QT Interval (msec) 428   QTc (msec) 420   Result Narrative  Sinus bradycardia Nonspecific ST abnormality Abnormal ECG When compared with ECG of 18-Dec-2014 09:35, ST now depressed in Inferior leads ST no longer depressed in Anterior leads I reviewed and concur with this report. Electronically signed [email or web address] MD, Darnell Level [phone]) on 12/28/2015 2:33:07 PM

## 2016-07-17 NOTE — Patient Instructions (Addendum)
Your procedure is scheduled on: MON 07/31/16 Report to [facility]. 2ND FLOOR MEDICAL MALL ENTRANCE. To find out your arrival time please call [phone] between 1PM - 3PM on FRI 07/28/16.  Remember: Instructions that are not followed completely may result in serious medical risk, up to and including death, or upon the discretion of your surgeon and anesthesiologist your surgery may need to be rescheduled.    __X__ 1. Do not eat food or drink liquids after midnight. No gum chewing or hard candies.     __X__ 2. No Alcohol for 24 hours before or after surgery.   ____ 3. Bring all medications with you on the day of surgery if instructed.    __X__ 4. Notify your doctor if there is any change in your medical condition     (cold, fever, infections).             __X___5. No smoking within 24 hours of your surgery.     Do not wear jewelry, make-up, hairpins, clips or nail polish.  Do not wear lotions, powders, or perfumes.   Do not shave 48 hours prior to surgery. Men may shave face and neck.  Do not bring valuables to the hospital.    [facility] is not responsible for any belongings or valuables.               Contacts, dentures or bridgework may not be worn into surgery.  Leave your suitcase in the car. After surgery it may be brought to your room.  For patients admitted to the hospital, discharge time is determined by your                treatment team.   Patients discharged the day of surgery will not be allowed to drive home.   Please read over the following fact sheets that you were given:   MRSA Information   __x__ Take these medicines the morning of surgery with A SIP OF WATER:    1. atenolol  2.   3.   4.  5.  6.  ____ Fleet Enema (as directed)   __X__ Use CHG Soap as directed  ____ Use inhalers on the day of surgery  ____ Stop metformin 2 days prior to surgery    ____ Take 1/2 of usual insulin dose the night before surgery and none on the morning of surgery.    __X__ Stop Coumadin/Plavix/aspirin on AS DIRECTED BY SURGEON  __X__ Stop Anti-inflammatories such as Advil, Aleve, Ibuprofen, Motrin, Naproxen, Naprosyn, Goodies,powder, mobic, meloxicam, or aspirin containing  products.  OK to take Tylenol. 7 DAYS PRIOR   __X__ Stop supplements until after surgery.  7 PRIOR TO SURGERY   ____ Bring C-Pap to the hospital.

## 2016-07-18 LAB — URINE CULTURE: Special Requests: NORMAL

## 2016-07-19 LAB — IGE: IgE (Immunoglobulin E), Serum: 420 IU/mL — ABNORMAL HIGH (ref 0–100)

## 2016-07-20 NOTE — Pre-Procedure Instructions (Signed)
CRP AND IGE FAXED TO DR Marry Guan AND LM FOR Margaretha Sheffield

## 2016-07-30 MED ORDER — CLINDAMYCIN PHOSPHATE 900 MG/50ML IV SOLN
900.0000 mg | INTRAVENOUS | Status: AC
  Administered 2016-07-31: 900 mg via INTRAVENOUS

## 2016-07-30 MED ORDER — SODIUM CHLORIDE 0.9 % IV SOLN
1000.0000 mg | INTRAVENOUS | Status: AC
  Administered 2016-07-31: 1000 mg via INTRAVENOUS
  Filled 2016-07-30: qty 10

## 2016-07-31 ENCOUNTER — Inpatient Hospital Stay: Payer: Medicare HMO

## 2016-07-31 ENCOUNTER — Encounter: Payer: Self-pay | Admitting: Orthopedic Surgery

## 2016-07-31 ENCOUNTER — Inpatient Hospital Stay
Admission: RE | Admit: 2016-07-31 | Discharge: 2016-08-01 | DRG: 468 | Disposition: A | Discharge status: Home / Self Care | Payer: Medicare HMO | Source: Ambulatory Visit | Attending: Orthopedic Surgery | Admitting: Orthopedic Surgery

## 2016-07-31 ENCOUNTER — Encounter
Admission: RE | Disposition: A | Discharge status: Home / Self Care | Payer: Self-pay | Source: Ambulatory Visit | Attending: Orthopedic Surgery

## 2016-07-31 ENCOUNTER — Inpatient Hospital Stay: Payer: Medicare HMO | Admitting: Certified Registered Nurse Anesthetist

## 2016-07-31 DIAGNOSIS — T84033A Mechanical loosening of internal left knee prosthetic joint, initial encounter: Secondary | ICD-10-CM | POA: Diagnosis not present

## 2016-07-31 DIAGNOSIS — Z6830 Body mass index (BMI) 30.0-30.9, adult: Secondary | ICD-10-CM

## 2016-07-31 DIAGNOSIS — Z887 Allergy status to serum and vaccine status: Secondary | ICD-10-CM | POA: Diagnosis not present

## 2016-07-31 DIAGNOSIS — Z471 Aftercare following joint replacement surgery: Secondary | ICD-10-CM | POA: Diagnosis not present

## 2016-07-31 DIAGNOSIS — Z96651 Presence of right artificial knee joint: Secondary | ICD-10-CM | POA: Diagnosis not present

## 2016-07-31 DIAGNOSIS — Z8619 Personal history of other infectious and parasitic diseases: Secondary | ICD-10-CM | POA: Diagnosis not present

## 2016-07-31 DIAGNOSIS — Z794 Long term (current) use of insulin: Secondary | ICD-10-CM | POA: Diagnosis not present

## 2016-07-31 DIAGNOSIS — E669 Obesity, unspecified: Secondary | ICD-10-CM | POA: Diagnosis present

## 2016-07-31 DIAGNOSIS — Y792 Prosthetic and other implants, materials and accessory orthopedic devices associated with adverse incidents: Secondary | ICD-10-CM | POA: Diagnosis present

## 2016-07-31 DIAGNOSIS — Y838 Other surgical procedures as the cause of abnormal reaction of the patient, or of later complication, without mention of misadventure at the time of the procedure: Secondary | ICD-10-CM | POA: Diagnosis present

## 2016-07-31 DIAGNOSIS — Z96652 Presence of left artificial knee joint: Secondary | ICD-10-CM | POA: Diagnosis not present

## 2016-07-31 DIAGNOSIS — Z7982 Long term (current) use of aspirin: Secondary | ICD-10-CM

## 2016-07-31 DIAGNOSIS — I10 Essential (primary) hypertension: Secondary | ICD-10-CM | POA: Diagnosis not present

## 2016-07-31 DIAGNOSIS — Z881 Allergy status to other antibiotic agents status: Secondary | ICD-10-CM | POA: Diagnosis not present

## 2016-07-31 DIAGNOSIS — Z981 Arthrodesis status: Secondary | ICD-10-CM

## 2016-07-31 DIAGNOSIS — M810 Age-related osteoporosis without current pathological fracture: Secondary | ICD-10-CM | POA: Diagnosis present

## 2016-07-31 DIAGNOSIS — Z888 Allergy status to other drugs, medicaments and biological substances status: Secondary | ICD-10-CM | POA: Diagnosis not present

## 2016-07-31 DIAGNOSIS — Z79899 Other long term (current) drug therapy: Secondary | ICD-10-CM

## 2016-07-31 DIAGNOSIS — M1990 Unspecified osteoarthritis, unspecified site: Secondary | ICD-10-CM | POA: Diagnosis not present

## 2016-07-31 DIAGNOSIS — Z96659 Presence of unspecified artificial knee joint: Secondary | ICD-10-CM

## 2016-07-31 DIAGNOSIS — J45909 Unspecified asthma, uncomplicated: Secondary | ICD-10-CM | POA: Diagnosis present

## 2016-07-31 HISTORY — PX: TOTAL KNEE REVISION: SHX996

## 2016-07-31 LAB — BASIC METABOLIC PANEL
ANION GAP: 11 (ref 5–15)
BUN: 35 mg/dL — ABNORMAL HIGH (ref 6–20)
CALCIUM: 9.6 mg/dL (ref 8.9–10.3)
CO2: 27 mmol/L (ref 22–32)
Chloride: 99 mmol/L — ABNORMAL LOW (ref 101–111)
Creatinine, Ser: 1.14 mg/dL — ABNORMAL HIGH (ref 0.44–1.00)
GFR, EST AFRICAN AMERICAN: 55 mL/min — AB (ref >60)
GFR, EST NON AFRICAN AMERICAN: 48 mL/min — AB (ref >60)
Glucose, Bld: 113 mg/dL — ABNORMAL HIGH (ref 65–99)
Potassium: 3.2 mmol/L — ABNORMAL LOW (ref 3.5–5.1)
Sodium: 137 mmol/L (ref 135–145)

## 2016-07-31 LAB — CBC
HCT: 43.4 % (ref 35.0–47.0)
Hemoglobin: 14.9 g/dL (ref 12.0–16.0)
MCH: 30.8 pg (ref 26.0–34.0)
MCHC: 34.5 g/dL (ref 32.0–36.0)
MCV: 89.4 fL (ref 80.0–100.0)
PLATELETS: 229 10*3/uL (ref 150–440)
RBC: 4.85 MIL/uL (ref 3.80–5.20)
RDW: 14.2 % (ref 11.5–14.5)
WBC: 11.4 10*3/uL — AB (ref 3.6–11.0)

## 2016-07-31 LAB — URINE DRUG SCREEN, QUALITATIVE (ARMC ONLY)
AMPHETAMINES, UR SCREEN: NOT DETECTED
Barbiturates, Ur Screen: NOT DETECTED
Benzodiazepine, Ur Scrn: NOT DETECTED
COCAINE METABOLITE, UR ~~LOC~~: NOT DETECTED
Cannabinoid 50 Ng, Ur ~~LOC~~: NOT DETECTED
MDMA (ECSTASY) UR SCREEN: NOT DETECTED
Methadone Scn, Ur: NOT DETECTED
OPIATE, UR SCREEN: NOT DETECTED
PHENCYCLIDINE (PCP) UR S: NOT DETECTED
Tricyclic, Ur Screen: NOT DETECTED

## 2016-07-31 LAB — ABO/RH: ABO/RH(D): A POS

## 2016-07-31 SURGERY — TOTAL KNEE REVISION
Anesthesia: Spinal | Site: Knee | Laterality: Left | Wound class: Clean

## 2016-07-31 MED ORDER — FAMOTIDINE 20 MG PO TABS
ORAL_TABLET | ORAL | Status: AC
  Administered 2016-07-31: 20 mg via ORAL
  Filled 2016-07-31: qty 1

## 2016-07-31 MED ORDER — SODIUM CHLORIDE 0.9 % IV SOLN
INTRAVENOUS | Status: DC
Start: 2016-07-31 — End: 2016-08-01
  Administered 2016-07-31 (×2): via INTRAVENOUS

## 2016-07-31 MED ORDER — TRAMADOL HCL 50 MG PO TABS
50.0000 mg | ORAL_TABLET | ORAL | Status: DC | PRN
Start: 2016-07-31 — End: 2016-08-01
  Administered 2016-07-31 – 2016-08-01 (×3): 100 mg via ORAL
  Filled 2016-07-31 (×3): qty 2

## 2016-07-31 MED ORDER — ACETAMINOPHEN 10 MG/ML IV SOLN
INTRAVENOUS | Status: AC
  Filled 2016-07-31: qty 100

## 2016-07-31 MED ORDER — BUPIVACAINE LIPOSOME 1.3 % IJ SUSP
INTRAMUSCULAR | Status: AC
  Filled 2016-07-31: qty 20

## 2016-07-31 MED ORDER — CELECOXIB 200 MG PO CAPS
200.0000 mg | ORAL_CAPSULE | Freq: Two times a day (BID) | ORAL | Status: DC
  Administered 2016-07-31 – 2016-08-01 (×3): 200 mg via ORAL
  Filled 2016-07-31 (×3): qty 1

## 2016-07-31 MED ORDER — ACETAMINOPHEN 325 MG PO TABS: 650.0000 mg | ORAL_TABLET | Freq: Four times a day (QID) | ORAL | Status: DC | PRN

## 2016-07-31 MED ORDER — CHLORHEXIDINE GLUCONATE 4 % EX LIQD
60.0000 mL | Freq: Once | CUTANEOUS | Status: AC
  Administered 2016-07-31: 4 via TOPICAL

## 2016-07-31 MED ORDER — BUPIVACAINE-EPINEPHRINE (PF) 0.25% -1:200000 IJ SOLN: INTRAMUSCULAR | Status: DC | PRN

## 2016-07-31 MED ORDER — FENTANYL CITRATE (PF) 100 MCG/2ML IJ SOLN
INTRAMUSCULAR | Status: AC
  Filled 2016-07-31: qty 2

## 2016-07-31 MED ORDER — PROPOFOL 500 MG/50ML IV EMUL
INTRAVENOUS | Status: DC | PRN
  Administered 2016-07-31: 50 ug/kg/min via INTRAVENOUS

## 2016-07-31 MED ORDER — MINERAL OIL LIGHT 100 % EX OIL
TOPICAL_OIL | CUTANEOUS | Status: AC
  Filled 2016-07-31: qty 25

## 2016-07-31 MED ORDER — VANCOMYCIN HCL 1000 MG IV SOLR
INTRAVENOUS | Status: AC
  Filled 2016-07-31: qty 3000

## 2016-07-31 MED ORDER — OXYCODONE HCL 5 MG PO TABS
5.0000 mg | ORAL_TABLET | ORAL | Status: DC | PRN
  Administered 2016-07-31 – 2016-08-01 (×5): 5 mg via ORAL
  Filled 2016-07-31 (×5): qty 1

## 2016-07-31 MED ORDER — TETRACAINE HCL 1 % IJ SOLN
INTRAMUSCULAR | Status: DC | PRN
  Administered 2016-07-31: 10 mg via INTRASPINAL

## 2016-07-31 MED ORDER — ACETAMINOPHEN 10 MG/ML IV SOLN
1000.0000 mg | Freq: Four times a day (QID) | INTRAVENOUS | Status: AC
  Administered 2016-07-31 – 2016-08-01 (×4): 1000 mg via INTRAVENOUS
  Filled 2016-07-31 (×4): qty 100

## 2016-07-31 MED ORDER — FAMOTIDINE 20 MG PO TABS
20.0000 mg | ORAL_TABLET | Freq: Once | ORAL | Status: AC
  Administered 2016-07-31: 20 mg via ORAL

## 2016-07-31 MED ORDER — NEOMYCIN-POLYMYXIN B GU 40-200000 IR SOLN
Status: AC
  Filled 2016-07-31: qty 20

## 2016-07-31 MED ORDER — BUPIVACAINE-EPINEPHRINE (PF) 0.25% -1:200000 IJ SOLN
INTRAMUSCULAR | Status: AC
  Filled 2016-07-31: qty 30

## 2016-07-31 MED ORDER — SODIUM CHLORIDE 0.9 % IV SOLN
1000.0000 mg | Freq: Once | INTRAVENOUS | Status: AC
  Administered 2016-07-31: 1000 mg via INTRAVENOUS
  Filled 2016-07-31: qty 10

## 2016-07-31 MED ORDER — BUPIVACAINE HCL (PF) 0.25 % IJ SOLN
INTRAMUSCULAR | Status: AC
  Filled 2016-07-31: qty 60

## 2016-07-31 MED ORDER — ONDANSETRON HCL 4 MG/2ML IJ SOLN
4.0000 mg | Freq: Four times a day (QID) | INTRAMUSCULAR | Status: DC | PRN
Start: 2016-07-31 — End: 2016-08-01

## 2016-07-31 MED ORDER — MENTHOL 3 MG MT LOZG
1.0000 | LOZENGE | OROMUCOSAL | Status: DC | PRN
  Filled 2016-07-31: qty 9

## 2016-07-31 MED ORDER — VITAMIN C 500 MG PO TABS
1000.0000 mg | ORAL_TABLET | Freq: Every day | ORAL | Status: DC
  Administered 2016-08-01: 1000 mg via ORAL
  Filled 2016-07-31 (×2): qty 2

## 2016-07-31 MED ORDER — TETRACAINE HCL 1 % IJ SOLN
INTRAMUSCULAR | Status: AC
  Filled 2016-07-31: qty 2

## 2016-07-31 MED ORDER — PROMETHAZINE HCL 25 MG/ML IJ SOLN: 6.2500 mg | INTRAMUSCULAR | Status: DC | PRN

## 2016-07-31 MED ORDER — VITAMIN E 180 MG (400 UNIT) PO CAPS
400.0000 [IU] | ORAL_CAPSULE | Freq: Every day | ORAL | Status: DC
  Administered 2016-08-01: 400 [IU] via ORAL
  Filled 2016-07-31 (×2): qty 1

## 2016-07-31 MED ORDER — ACETAMINOPHEN 650 MG RE SUPP: 650.0000 mg | Freq: Four times a day (QID) | RECTAL | Status: DC | PRN

## 2016-07-31 MED ORDER — PHENYLEPHRINE HCL 10 MG/ML IJ SOLN
INTRAMUSCULAR | Status: AC
  Filled 2016-07-31: qty 1

## 2016-07-31 MED ORDER — 0.9 % SODIUM CHLORIDE (POUR BTL) OPTIME
TOPICAL | Status: DC | PRN
  Administered 2016-07-31: 250 mL

## 2016-07-31 MED ORDER — KETAMINE HCL 50 MG/ML IJ SOLN
INTRAMUSCULAR | Status: DC | PRN
  Administered 2016-07-31 (×4): 1.4 mg via INTRAMUSCULAR

## 2016-07-31 MED ORDER — MORPHINE SULFATE (PF) 2 MG/ML IV SOLN: 2.0000 mg | INTRAVENOUS | Status: DC | PRN

## 2016-07-31 MED ORDER — SODIUM CHLORIDE 0.9 % IJ SOLN
INTRAMUSCULAR | Status: AC
  Filled 2016-07-31: qty 50

## 2016-07-31 MED ORDER — BUPIVACAINE HCL (PF) 0.5 % IJ SOLN
INTRAMUSCULAR | Status: DC | PRN
  Administered 2016-07-31: 2 mL

## 2016-07-31 MED ORDER — LIDOCAINE HCL (PF) 2 % IJ SOLN
INTRAMUSCULAR | Status: AC
  Filled 2016-07-31: qty 2

## 2016-07-31 MED ORDER — BUPIVACAINE LIPOSOME 1.3 % IJ SUSP
INTRAMUSCULAR | Status: DC | PRN
  Administered 2016-07-31: 11:00:00

## 2016-07-31 MED ORDER — ALUM & MAG HYDROXIDE-SIMETH 200-200-20 MG/5ML PO SUSP: 30.0000 mL | ORAL | Status: DC | PRN

## 2016-07-31 MED ORDER — PRAVASTATIN SODIUM 20 MG PO TABS
40.0000 mg | ORAL_TABLET | Freq: Every day | ORAL | Status: DC
  Administered 2016-07-31: 40 mg via ORAL
  Filled 2016-07-31: qty 2

## 2016-07-31 MED ORDER — MEPERIDINE HCL 50 MG/ML IJ SOLN: 6.2500 mg | INTRAMUSCULAR | Status: DC | PRN

## 2016-07-31 MED ORDER — ATENOLOL 25 MG PO TABS
50.0000 mg | ORAL_TABLET | Freq: Every day | ORAL | Status: DC
  Administered 2016-08-01: 50 mg via ORAL
  Filled 2016-07-31: qty 2

## 2016-07-31 MED ORDER — TOBRAMYCIN SULFATE 1.2 G IJ SOLR
INTRAMUSCULAR | Status: AC
  Filled 2016-07-31: qty 10.8

## 2016-07-31 MED ORDER — ENOXAPARIN SODIUM 30 MG/0.3ML ~~LOC~~ SOLN
30.0000 mg | Freq: Two times a day (BID) | SUBCUTANEOUS | Status: DC
  Administered 2016-08-01: 30 mg via SUBCUTANEOUS
  Filled 2016-07-31: qty 0.3

## 2016-07-31 MED ORDER — NYSTATIN 100000 UNIT/GM EX OINT
1.0000 "application " | TOPICAL_OINTMENT | Freq: Every day | CUTANEOUS | Status: DC | PRN
  Filled 2016-07-31: qty 15

## 2016-07-31 MED ORDER — BISACODYL 10 MG RE SUPP
10.0000 mg | Freq: Every day | RECTAL | Status: DC | PRN
  Filled 2016-07-31: qty 1

## 2016-07-31 MED ORDER — OXYCODONE HCL 5 MG/5ML PO SOLN: 5.0000 mg | Freq: Once | ORAL | Status: DC | PRN

## 2016-07-31 MED ORDER — SODIUM CHLORIDE 0.9 % IV SOLN
INTRAVENOUS | Status: DC
  Administered 2016-07-31: 13:00:00 via INTRAVENOUS

## 2016-07-31 MED ORDER — MIDAZOLAM HCL 2 MG/2ML IJ SOLN
INTRAMUSCULAR | Status: AC
  Filled 2016-07-31: qty 2

## 2016-07-31 MED ORDER — BUPIVACAINE HCL (PF) 0.25 % IJ SOLN
INTRAMUSCULAR | Status: DC | PRN
  Administered 2016-07-31: 60 mL

## 2016-07-31 MED ORDER — VITAMIN D 1000 UNITS PO TABS
1000.0000 [IU] | ORAL_TABLET | Freq: Every day | ORAL | Status: DC
Start: 2016-07-31 — End: 2016-08-01
  Administered 2016-08-01: 1000 [IU] via ORAL
  Filled 2016-07-31: qty 1

## 2016-07-31 MED ORDER — PHENOL 1.4 % MT LIQD
1.0000 | OROMUCOSAL | Status: DC | PRN
  Filled 2016-07-31: qty 177

## 2016-07-31 MED ORDER — ACETAMINOPHEN 10 MG/ML IV SOLN
INTRAVENOUS | Status: DC | PRN
  Administered 2016-07-31: 1000 mg via INTRAVENOUS

## 2016-07-31 MED ORDER — CLINDAMYCIN PHOSPHATE 600 MG/50ML IV SOLN
600.0000 mg | Freq: Four times a day (QID) | INTRAVENOUS | Status: AC
  Administered 2016-07-31 – 2016-08-01 (×4): 600 mg via INTRAVENOUS
  Filled 2016-07-31 (×5): qty 50

## 2016-07-31 MED ORDER — SENNOSIDES-DOCUSATE SODIUM 8.6-50 MG PO TABS
1.0000 | ORAL_TABLET | Freq: Two times a day (BID) | ORAL | Status: DC
  Administered 2016-07-31 – 2016-08-01 (×2): 1 via ORAL
  Filled 2016-07-31 (×2): qty 1

## 2016-07-31 MED ORDER — TRIAMTERENE-HCTZ 37.5-25 MG PO TABS
1.0000 | ORAL_TABLET | Freq: Every day | ORAL | Status: DC
  Administered 2016-08-01: 1 via ORAL
  Filled 2016-07-31 (×2): qty 1

## 2016-07-31 MED ORDER — FENTANYL CITRATE (PF) 100 MCG/2ML IJ SOLN
INTRAMUSCULAR | Status: DC | PRN
  Administered 2016-07-31: 25 ug via INTRAVENOUS
  Administered 2016-07-31: 50 ug via INTRAVENOUS
  Administered 2016-07-31: 25 ug via INTRAVENOUS

## 2016-07-31 MED ORDER — ADULT MULTIVITAMIN W/MINERALS CH
1.0000 | ORAL_TABLET | Freq: Every day | ORAL | Status: DC
  Administered 2016-08-01: 1 via ORAL
  Filled 2016-07-31: qty 1

## 2016-07-31 MED ORDER — PANTOPRAZOLE SODIUM 40 MG PO TBEC
40.0000 mg | DELAYED_RELEASE_TABLET | Freq: Every day | ORAL | Status: DC
  Administered 2016-08-01: 40 mg via ORAL
  Filled 2016-07-31: qty 1

## 2016-07-31 MED ORDER — FLEET ENEMA 7-19 GM/118ML RE ENEM
1.0000 | ENEMA | Freq: Once | RECTAL | Status: AC | PRN
  Administered 2016-08-01: 1 via RECTAL

## 2016-07-31 MED ORDER — GLYCOPYRROLATE 0.2 MG/ML IJ SOLN
INTRAMUSCULAR | Status: AC
Start: 2016-07-31 — End: 2016-07-31
  Filled 2016-07-31: qty 1

## 2016-07-31 MED ORDER — OXYCODONE HCL 5 MG PO TABS: 5.0000 mg | ORAL_TABLET | Freq: Once | ORAL | Status: DC | PRN

## 2016-07-31 MED ORDER — NEOMYCIN-POLYMYXIN B GU 40-200000 IR SOLN
Status: DC | PRN
  Administered 2016-07-31: 12 mL

## 2016-07-31 MED ORDER — PROPOFOL 10 MG/ML IV BOLUS
INTRAVENOUS | Status: DC | PRN
Start: 2016-07-31 — End: 2016-07-31
  Administered 2016-07-31 (×4): 14 mg via INTRAVENOUS

## 2016-07-31 MED ORDER — MIDAZOLAM HCL 5 MG/5ML IJ SOLN
INTRAMUSCULAR | Status: DC | PRN
  Administered 2016-07-31: 2 mg via INTRAVENOUS

## 2016-07-31 MED ORDER — SODIUM CHLORIDE 0.9 % IV SOLN
INTRAVENOUS | Status: DC | PRN
  Administered 2016-07-31: 30 ug/min via INTRAVENOUS

## 2016-07-31 MED ORDER — KETAMINE HCL 50 MG/ML IJ SOLN
INTRAMUSCULAR | Status: AC
  Filled 2016-07-31: qty 10

## 2016-07-31 MED ORDER — GLYCOPYRROLATE 0.2 MG/ML IJ SOLN
INTRAMUSCULAR | Status: DC | PRN
  Administered 2016-07-31: 0.2 mg via INTRAVENOUS

## 2016-07-31 MED ORDER — CLINDAMYCIN PHOSPHATE 900 MG/50ML IV SOLN
INTRAVENOUS | Status: AC
  Filled 2016-07-31: qty 50

## 2016-07-31 MED ORDER — ONDANSETRON HCL 4 MG PO TABS: 4.0000 mg | ORAL_TABLET | Freq: Four times a day (QID) | ORAL | Status: DC | PRN

## 2016-07-31 MED ORDER — METOCLOPRAMIDE HCL 10 MG PO TABS
10.0000 mg | ORAL_TABLET | Freq: Three times a day (TID) | ORAL | Status: DC
  Administered 2016-07-31 – 2016-08-01 (×4): 10 mg via ORAL
  Filled 2016-07-31 (×4): qty 1

## 2016-07-31 MED ORDER — PROPOFOL 500 MG/50ML IV EMUL
INTRAVENOUS | Status: AC
  Filled 2016-07-31: qty 50

## 2016-07-31 MED ORDER — CALCIUM-MAGNESIUM-ZINC 500-250-12.5 MG PO TABS: 2.0000 | ORAL_TABLET | Freq: Every day | ORAL | Status: DC

## 2016-07-31 MED ORDER — FENTANYL CITRATE (PF) 100 MCG/2ML IJ SOLN
25.0000 ug | INTRAMUSCULAR | Status: DC | PRN
  Administered 2016-07-31: 50 ug via INTRAVENOUS

## 2016-07-31 MED ORDER — FERROUS SULFATE 325 (65 FE) MG PO TABS
325.0000 mg | ORAL_TABLET | Freq: Two times a day (BID) | ORAL | Status: DC
  Administered 2016-07-31 – 2016-08-01 (×2): 325 mg via ORAL
  Filled 2016-07-31 (×2): qty 1

## 2016-07-31 MED ORDER — DEXAMETHASONE SODIUM PHOSPHATE 4 MG/ML IJ SOLN
INTRAMUSCULAR | Status: DC | PRN
  Administered 2016-07-31: 5 mg via INTRAVENOUS

## 2016-07-31 MED ORDER — SODIUM CHLORIDE 0.9 % IV SOLN
INTRAVENOUS | Status: DC | PRN
  Administered 2016-07-31: 5 ug/kg/min via INTRAVENOUS

## 2016-07-31 MED ORDER — LACTATED RINGERS IV SOLN: INTRAVENOUS | Status: DC

## 2016-07-31 MED ORDER — MAGNESIUM HYDROXIDE 400 MG/5ML PO SUSP
30.0000 mL | Freq: Every day | ORAL | Status: DC | PRN
  Administered 2016-08-01: 30 mL via ORAL
  Filled 2016-07-31: qty 30

## 2016-07-31 SURGICAL SUPPLY — 80 items
BLADE OSCILLATING/SAGITTAL (BLADE) ×1
BLADE SAW 1 (BLADE) ×2 IMPLANT
BLADE SAW 1/2 (BLADE) ×4 IMPLANT
BLADE SW THK.38XMED LNG THN (BLADE) ×1 IMPLANT
BONE CEMENT GENTAMICIN (Cement) ×4 IMPLANT
CANISTER SUCT 1200ML W/VALVE (MISCELLANEOUS) ×2 IMPLANT
CANISTER SUCT 3000ML PPV (MISCELLANEOUS) ×2 IMPLANT
CATH FOL LEG HOLDER (MISCELLANEOUS) ×2 IMPLANT
CATH TRAY METER 16FR LF (MISCELLANEOUS) ×2 IMPLANT
CEMENT BONE GENTAMICIN 40 (Cement) ×2 IMPLANT
CNTNR SPEC 2.5X3XGRAD LEK (MISCELLANEOUS) ×1
CONT SPEC 4OZ STER OR WHT (MISCELLANEOUS) ×1
CONTAINER SPEC 2.5X3XGRAD LEK (MISCELLANEOUS) ×1 IMPLANT
COOLER POLAR GLACIER W/PUMP (MISCELLANEOUS) ×2 IMPLANT
CUFF TOURN 24 STER (MISCELLANEOUS) ×2 IMPLANT
CUFF TOURN 30 STER DUAL PORT (MISCELLANEOUS) IMPLANT
DRAPE SHEET LG 3/4 BI-LAMINATE (DRAPES) ×4 IMPLANT
DRAPE TABLE BACK 80X90 (DRAPES) ×2 IMPLANT
DRSG DERMACEA 8X12 NADH (GAUZE/BANDAGES/DRESSINGS) ×2 IMPLANT
DRSG OPSITE POSTOP 4X14 (GAUZE/BANDAGES/DRESSINGS) ×2 IMPLANT
DRSG TEGADERM 4X4.75 (GAUZE/BANDAGES/DRESSINGS) ×2 IMPLANT
DURAPREP 26ML APPLICATOR (WOUND CARE) ×2 IMPLANT
ELECT CAUTERY BLADE 6.4 (BLADE) ×2 IMPLANT
ELECT REM PT RETURN 9FT ADLT (ELECTROSURGICAL) ×2
ELECTRODE REM PT RTRN 9FT ADLT (ELECTROSURGICAL) ×1 IMPLANT
GAUZE SPONGE 4X4 12PLY STRL (GAUZE/BANDAGES/DRESSINGS) ×2 IMPLANT
GLOVE BIOGEL M STRL SZ7.5 (GLOVE) ×8 IMPLANT
GLOVE BIOGEL PI IND STRL 9 (GLOVE) ×3 IMPLANT
GLOVE BIOGEL PI INDICATOR 9 (GLOVE) ×3
GLOVE INDICATOR 8.0 STRL GRN (GLOVE) ×8 IMPLANT
GLOVE SURG SYN 9.0  PF PI (GLOVE) ×3
GLOVE SURG SYN 9.0 PF PI (GLOVE) ×3 IMPLANT
GOWN STRL REUS W/ TWL LRG LVL3 (GOWN DISPOSABLE) ×3 IMPLANT
GOWN STRL REUS W/TWL 2XL LVL3 (GOWN DISPOSABLE) ×2 IMPLANT
GOWN STRL REUS W/TWL LRG LVL3 (GOWN DISPOSABLE) ×3
HANDPIECE VERSAJET DEBRIDEMENT (MISCELLANEOUS) ×2 IMPLANT
HEMOVAC 400CC 10FR (MISCELLANEOUS) ×2 IMPLANT
HOOD PEEL AWAY FLYTE STAYCOOL (MISCELLANEOUS) ×4 IMPLANT
INSERT PFC SIG STB SZ3 15.0MM (Knees) ×2 IMPLANT
IRRIGATION STRYKERFLOW (MISCELLANEOUS) ×1 IMPLANT
IRRIGATOR STRYKERFLOW (MISCELLANEOUS) ×2
KIT RM TURNOVER STRD PROC AR (KITS) ×2 IMPLANT
KNIFE SCULPS 14X20 (INSTRUMENTS) ×2 IMPLANT
NDL SAFETY 18GX1.5 (NEEDLE) ×2 IMPLANT
NEEDLE SPNL 18GX3.5 QUINCKE PK (NEEDLE) ×2 IMPLANT
NEEDLE SPNL 20GX3.5 QUINCKE YW (NEEDLE) ×2 IMPLANT
NS IRRIG 1000ML POUR BTL (IV SOLUTION) ×2 IMPLANT
NS IRRIG 500ML POUR BTL (IV SOLUTION) ×4 IMPLANT
PACK TOTAL KNEE (MISCELLANEOUS) ×2 IMPLANT
PAD ABD DERMACEA PRESS 5X9 (GAUZE/BANDAGES/DRESSINGS) ×2 IMPLANT
PAD CAST CTTN 4X4 STRL (SOFTGOODS) ×1 IMPLANT
PAD WRAPON POLAR KNEE (MISCELLANEOUS) ×1 IMPLANT
PADDING CAST COTTON 4X4 STRL (SOFTGOODS) ×1
PULSAVAC PLUS IRRIG FAN TIP (DISPOSABLE) ×2
SLEEVE MBT PROUS ML 29MM (Knees) ×2 IMPLANT
SOL .9 NS 3000ML IRR  AL (IV SOLUTION) ×1
SOL .9 NS 3000ML IRR UROMATIC (IV SOLUTION) ×1 IMPLANT
SPONGE DRAIN TRACH 4X4 STRL 2S (GAUZE/BANDAGES/DRESSINGS) ×2 IMPLANT
SPONGE LAP 18X18 5 PK (GAUZE/BANDAGES/DRESSINGS) ×2 IMPLANT
STAPLER SKIN PROX 35W (STAPLE) ×2 IMPLANT
STEM UNIVERSAL REVISION 75X14 (Stem) ×2 IMPLANT
SUCTION FRAZIER HANDLE 10FR (MISCELLANEOUS) ×1
SUCTION TUBE FRAZIER 10FR DISP (MISCELLANEOUS) ×1 IMPLANT
SUT MNCRL 0 1X36 CT-1 (SUTURE) IMPLANT
SUT MON AB 2-0 CT1 36 (SUTURE) IMPLANT
SUT MONOCRYL 0 (SUTURE)
SUT PROLENE 1 CT 1 30 (SUTURE) IMPLANT
SUT VIC AB 0 CT1 36 (SUTURE) ×2 IMPLANT
SUT VIC AB 1 CT1 36 (SUTURE) ×4 IMPLANT
SUT VIC AB 2-0 CT1 (SUTURE) ×2 IMPLANT
SWAB CULTURE AMIES ANAERIB BLU (MISCELLANEOUS) ×4 IMPLANT
SYR 20CC LL (SYRINGE) ×2 IMPLANT
SYR 30ML LL (SYRINGE) ×4 IMPLANT
TIP COAXIAL FEMORAL CANAL (MISCELLANEOUS) ×2 IMPLANT
TIP FAN IRRIG PULSAVAC PLUS (DISPOSABLE) ×1 IMPLANT
TOWEL OR 17X26 4PK STRL BLUE (TOWEL DISPOSABLE) ×2 IMPLANT
TOWER CARTRIDGE SMART MIX (DISPOSABLE) ×2 IMPLANT
TRAY REVISION SZ 2.5 (Knees) ×2 IMPLANT
WEDGE SZ 2.0MM (Knees) ×2 IMPLANT
WRAPON POLAR PAD KNEE (MISCELLANEOUS) ×2

## 2016-07-31 NOTE — Op Note (Signed)
OPERATIVE NOTE  DATE OF SURGERY:  07/31/2016  PATIENT NAME:  Molly Mitchell   DOB: Feb 21, 1945  MRN: [id number]  PRE-OPERATIVE DIAGNOSIS: Loose tibial implant status post left total knee arthroplasty  POST-OPERATIVE DIAGNOSIS:  Same  PROCEDURE:  Left knee revision arthroplasty (tibial component)  SURGEON:  Marciano Sequin. M.D.  ASSISTANT:  Vance Peper, PA (present and scrubbed throughout the case, critical for assistance with exposure, retraction, instrumentation, and closure)  ANESTHESIA: spinal  ESTIMATED BLOOD LOSS: 50 mL  FLUIDS REPLACED: 1450 mL of crystalloid  TOURNIQUET TIME: 158 minutes  DRAINS: 2 medium Hemovac drains  IMPLANTS UTILIZED: DePuy size 2.5 MBT revision tibial component, 29 mm MBT revision metaphyseal sleeve (porous), size 2 10 mm step wedge (medial), 75 mm x 14 mm universal fluted stem, and a 15 mm stabilized rotating platform polyethylene insert.  INDICATIONS FOR SURGERY: Molly Mitchell is a 71 y.o. year old female with a who underwent left total knee arthroplasty approximately 3 years ago. More recently she was having some medial knee pain and noted progressive varus deformity to the knee. X-rays demonstrated findings consistent with loosening of the tibial component with collapse of the medial aspect of the tibia. After discussion of the risks and benefits of surgical intervention, the patient expressed understanding of the risks benefits and agree with plans for revision knee arthroplasty.   The risks, benefits, and alternatives were discussed at length including but not limited to the risks of infection, bleeding, nerve injury, stiffness, blood clots, the need for revision surgery, cardiopulmonary complications, among others, and they were willing to proceed.  PROCEDURE IN DETAIL: The patient was brought into the operating room and, after adequate spinal anesthesia was achieved, a tourniquet was placed on the patient's upper thigh. The patient's knee and leg  were cleaned and prepped with alcohol and DuraPrep and draped in the usual sterile fashion. A "timeout" was performed as per usual protocol. The lower extremity was exsanguinated using an Esmarch, and the tourniquet was inflated to 300 mmHg. An anterior longitudinal incision was made followed by a standard medial parapatellar approach. The deep fibers of the medial collateral ligament were elevated in a subperiosteal fashion off of the medial flare of the tibia so as to maintain a continuous soft tissue sleeve. The patella was subluxed laterally and an extensive synovectomy was performed so as to reestablish the medial and lateral gutters. The polyethylene implant was removed without difficulty. Additional debridement of the fibrotic tissue in the popliteal region was performed using electrocautery and rongeurs. A curet was used to develop the interface between the implants and the bone along both the tibial and femoral components. The femoral component was noted to be stable without any evidence of loosening or instability. There was gross motion of the tibial component and the tibial component was easily removed. A large amount of fibrotic tissue was encountered where there had been gross loss of bony integrity especially along the medial aspect of the tibia. The remaining cement mantle was carefully removed using bone splitting osteotomes and pituitary rongeurs. A canal finder was used to identify the intramedullary canal of the tibia. Swabs were obtained for stat Gram stain, cultures and sensitivity. The tibial canal was then reamed in a sequential fashion up to a 14 mm diameter. A cutting guide was attached to the intramedullary reamer and a cleanup cut was performed taking primarily bone from the lateral aspect. It was decided to prepare of the tibia for a medial step wedge of 10  mm. The reamer was then advanced so as to prepare for the 29 mm metaphyseal sleeve. The broach was then inserted and impacted into  place. The intramedullary cutting guide for the 10 mm step wedge was then placed in a 10 mm resection was removed from the medial aspect. The broach was removed. A 2.5 mm MBT revision trial with a 29 mm sleeve and a 75 mm x 14 mm stem was then provisionally placed and trial reduction was performed using first a 12.5 and subsequently a 15 mm polyethylene trial. This allowed for full extension excellent mediolateral soft tissue balancing. Good patellar tracking was appreciated. The trial components were then removed.  Cut surfaces of bone were irrigated with copious amounts of normal saline with antibiotic solution using pulsatile lavage and then suctioned dry. The tibial component was constructed on the back table utilizing a 2.5 mm MBT revision tray, a 10 mm size 2 medial step wedge, a 29 mm metaphyseal sleeve, and a 75 mm x 14 mm fluted stem. Polymethylmethacrylate cement with gentamicin was prepared in the usual fashion using a vacuum mixer. Cement was applied to the cut surface of the proximal tibia as well as along the undersurface of a size 2.5 MBT revision tibial component. Tibial component was positioned and impacted into place. Excess cement was removed using [employer] fast streamer.  A 15 mm polyethylene trial was inserted and the knee was brought into full extension with steady axial compression applied.  After adequate curing of the cement, the tourniquet was deflated after a total tourniquet time of 158 minutes. Hemostasis was achieved using electrocautery. The knee was irrigated with copious amounts of normal saline with antibiotic solution using pulsatile lavage and then suctioned dry. 20 mL of 1.3% Exparel and 60 mL of 0.25% Marcaine in 40 mL of normal saline was injected along the posterior capsule, medial and lateral gutters, and along the arthrotomy site. A 15 mm stabilized rotating platform polyethylene insert was inserted and the knee was placed through a range of motion with excellent mediolateral  soft tissue balancing appreciated and excellent patellar tracking noted. 2 medium drains were placed in the wound bed and brought out through separate stab incisions. The medial parapatellar portion of the incision was reapproximated using interrupted sutures of #1 Vicryl. Subcutaneous tissue was approximated in layers using first #0 Vicryl followed #2-0 Vicryl. The skin was approximated with skin staples. A sterile dressing was applied.  The patient tolerated the procedure well and was transported to the recovery room in stable condition.    Kerisha Goughnour P. Holley Bouche., M.D.

## 2016-07-31 NOTE — Anesthesia Preprocedure Evaluation (Signed)
Anesthesia Evaluation  Patient identified by MRN, date of birth, ID band Patient awake    Reviewed: Allergy & Precautions, NPO status , Patient's Chart, lab work & pertinent test results  History of Anesthesia Complications Negative for: history of anesthetic complications  Airway Mallampati: II  TM Distance: >3 FB Neck ROM: Full    Dental no notable dental hx.    Pulmonary asthma , neg sleep apnea, neg COPD, former smoker,    breath sounds clear to auscultation- rhonchi (-) wheezing      Cardiovascular hypertension, Pt. on medications (-) Past MI, (-) Cardiac Stents and (-) CABG  Rhythm:Regular Rate:Normal - Systolic murmurs and - Diastolic murmurs    Neuro/Psych negative neurological ROS  negative psych ROS   GI/Hepatic negative GI ROS, Neg liver ROS,   Endo/Other  negative endocrine ROSneg diabetes  Renal/GU negative Renal ROS     Musculoskeletal  (+) Arthritis ,   Abdominal (+) - obese,   Peds  Hematology negative hematology ROS (+)   Anesthesia Other Findings Past Medical History: No date: Arthritis No date: HLD (hyperlipidemia) No date: Hypertension   Reproductive/Obstetrics                             Anesthesia Physical Anesthesia Plan  ASA: II  Anesthesia Plan: Spinal   Post-op Pain Management:    Induction:   PONV Risk Score and Plan: 2 and Propofol  Airway Management Planned: Natural Airway  Additional Equipment:   Intra-op Plan:   Post-operative Plan:   Informed Consent: I have reviewed the patients History and Physical, chart, labs and discussed the procedure including the risks, benefits and alternatives for the proposed anesthesia with the patient or authorized representative who has indicated his/her understanding and acceptance.   Dental advisory given  Plan Discussed with: Anesthesiologist and CRNA  Anesthesia Plan Comments:         Lab  Results  Component Value Date   WBC 11.4 (H) 07/31/2016   HGB 14.9 07/31/2016   HCT 43.4 07/31/2016   MCV 89.4 07/31/2016   PLT 229 07/31/2016    Anesthesia Quick Evaluation

## 2016-07-31 NOTE — Evaluation (Signed)
Physical Therapy Evaluation Patient Details Name: Molly Mitchell MRN: [id number] DOB: 18-Sep-1945 Today's Date: 07/31/2016   History of Present Illness  Pt is a 70 y/o F who had L TKA completed ~3 years ago and is now s/p L TKA revision.   Clinical Impression  Patient is s/p above surgery resulting in functional limitations due to the deficits listed below (see PT Problem List). Molly Mitchell had an excellent start to her therapy.  She ambulated around the nursing station x2 and completed therapeutic exercises in supine and sitting.  Patient will benefit from skilled PT to increase their independence and safety with mobility to allow discharge to the venue listed below.      Follow Up Recommendations Outpatient PT    Equipment Recommendations  None recommended by PT    Recommendations for Other Services       Precautions / Restrictions Precautions Precautions: Fall Restrictions Weight Bearing Restrictions: Yes LLE Weight Bearing: Weight bearing as tolerated      Mobility  Bed Mobility Overal bed mobility: Modified Independent             General bed mobility comments: Mild increased time and effort.  No physical assist or cues needed.  Transfers Overall transfer level: Needs assistance Equipment used: Rolling walker (2 wheeled) Transfers: Sit to/from Stand Sit to Stand: Supervision         General transfer comment: Supervision for safety and cues for proper hand placement as pt initially reaches for RW with BUEs to stand.    Ambulation/Gait Ambulation/Gait assistance: Supervision Ambulation Distance (Feet): 360 Feet Assistive device: Rolling walker (2 wheeled) Gait Pattern/deviations: Step-through pattern;Step-to pattern;Decreased stance time - left;Decreased step length - right;Decreased stride length;Decreased weight shift to left;Antalgic Gait velocity: Mildly decreased Gait velocity interpretation: Below normal speed for age/gender General Gait Details: Cues for  upright posture and forward gaze.  Initially pt ambulating with step to pattern but transitions to step throughout gait pattern without cues needed.  Demonstration and cues for heel strike with LLE.   Stairs            Wheelchair Mobility    Modified Rankin (Stroke Patients Only)       Balance Overall balance assessment: Needs assistance Sitting-balance support: No upper extremity supported;Feet supported Sitting balance-Leahy Scale: Good     Standing balance support: No upper extremity supported;During functional activity Standing balance-Leahy Scale: Fair Standing balance comment: Pt able to stand statically without UE support but requires UE support for dynamic activities                             Pertinent Vitals/Pain Pain Assessment: 0-10 Pain Score: 4  Pain Location: L knee when ambulating Pain Descriptors / Indicators: Aching;Discomfort Pain Intervention(s): Limited activity within patient's tolerance;Monitored during session;Repositioned;Ice applied    Home Living Family/patient expects to be discharged to:: Private residence Living Arrangements: Spouse/significant other Available Help at Discharge: Family;Available 24 hours/day Type of Home: House Home Access: Stairs to enter Entrance Stairs-Rails: None Entrance Stairs-Number of Steps: 2   Home Equipment: [place] - 2 wheels;Shower seat;Cane - single point      Prior Function Level of Independence: Independent with assistive device(s)         Comments: Pt was independent without any recent falls.  She recently has been ambulating with her cane due to pain.  Pt goes to the gym daily for strengthening and cardio exercise.     Hand Dominance  Extremity/Trunk Assessment   Upper Extremity Assessment Upper Extremity Assessment: Overall WFL for tasks assessed    Lower Extremity Assessment Lower Extremity Assessment: LLE deficits/detail LLE Deficits / Details: Limited ROM as  expected s/p L TKA revision.  Pt able to perform SLR, ankle pumps, knee presses, glute squeeze.    Cervical / Trunk Assessment Cervical / Trunk Assessment: Normal  Communication   Communication: No difficulties  Cognition Arousal/Alertness: Awake/alert Behavior During Therapy: WFL for tasks assessed/performed Overall Cognitive Status: Within Functional Limits for tasks assessed                                        General Comments      Exercises Total Joint Exercises Ankle Circles/Pumps: AROM;Both;10 reps;Supine Quad Sets: Strengthening;Both;10 reps;Supine Heel Slides: AROM;Left;10 reps;Supine Straight Leg Raises: AROM;Left;10 reps;Supine Long Arc Quad: AROM;Strengthening;Left;10 reps;Seated Knee Flexion: AAROM;Left;Other (comment);Seated;Other reps (comment) (3 reps with 10 second holds) Goniometric ROM: 0-88 deg L knee AAROM   Assessment/Plan    PT Assessment Patient needs continued PT services  PT Problem List Decreased strength;Decreased range of motion;Decreased balance;Decreased knowledge of use of DME;Decreased safety awareness;Pain       PT Treatment Interventions DME instruction;Gait training;Stair training;Functional mobility training;Therapeutic activities;Therapeutic exercise;Balance training;Neuromuscular re-education;Patient/family education;Modalities    PT Goals (Current goals can be found in the Care Plan section)  Acute Rehab PT Goals Patient Stated Goal: to get stronger and get back to the gym PT Goal Formulation: With patient Time For Goal Achievement: 08/14/16 Potential to Achieve Goals: Good    Frequency BID   Barriers to discharge        Co-evaluation               AM-PAC PT "6 Clicks" Daily Activity  Outcome Measure Difficulty turning over in bed (including adjusting bedclothes, sheets and blankets)?: None Difficulty moving from lying on back to sitting on the side of the bed? : None Difficulty sitting down on and  standing up from a chair with arms (e.g., wheelchair, bedside commode, etc,.)?: A Little Help needed moving to and from a bed to chair (including a wheelchair)?: A Little Help needed walking in hospital room?: A Little Help needed climbing 3-5 steps with a railing? : A Little 6 Click Score: 20    End of Session Equipment Utilized During Treatment: Gait belt Activity Tolerance: Patient tolerated treatment well Patient left: in chair;with call bell/phone within reach;with chair alarm set;Other (comment) (with polar care and bone foam in place) Nurse Communication: Mobility status PT Visit Diagnosis: Pain;Other abnormalities of gait and mobility (R26.89) Pain - Right/Left: Left Pain - part of body: Knee    Time: 3428-7681 PT Time Calculation (min) (ACUTE ONLY): 26 min   Charges:   PT Evaluation $PT Eval Low Complexity: 1 Procedure PT Treatments $Gait Training: 8-22 mins   PT G Codes:        Collie Siad PT, DPT 07/31/2016, 4:32 PM

## 2016-07-31 NOTE — Progress Notes (Signed)
Patient was admitted to room 155 from OR via room bed and OR staff. Hemovac, polar care, Foley, NSL, TED, and foot pumps in place. A&O x4. Oriented to room, call light, TV and bed controls. Clear liquid diet ordered. Bed alarm on for safety. Reviewed POC and orders.

## 2016-07-31 NOTE — Transfer of Care (Signed)
Immediate Anesthesia Transfer of Care Note  Patient: Molly Mitchell  Procedure(s) Performed: Procedure(s): TOTAL KNEE REVISION (Left)  Patient Location: PACU  Anesthesia Type:General  Level of Consciousness: awake and alert   Airway & Oxygen Therapy: Patient Spontanous Breathing and Patient connected to nasal cannula oxygen  Post-op Assessment: Report given to RN and Post -op Vital signs reviewed and stable  Post vital signs: Reviewed and stable  Last Vitals:  Vitals:   07/31/16 0609  BP: (!) 140/91  Pulse: 73  Resp: 18  Temp: 37 C    Last Pain:  Vitals:   07/31/16 0609  TempSrc: Tympanic  PainSc: 6          Complications: No apparent anesthesia complications

## 2016-07-31 NOTE — Anesthesia Procedure Notes (Signed)
Spinal  Patient location during procedure: OR Start time: 07/31/2016 7:15 AM End time: 07/31/2016 7:30 AM Staffing Anesthesiologist: Randa Lynn Stanislav Gervase Performed: anesthesiologist  Preanesthetic Checklist Completed: patient identified, site marked, surgical consent, pre-op evaluation, timeout performed, IV checked, risks and benefits discussed and monitors and equipment checked Spinal Block Patient position: sitting Prep: ChloraPrep Patient monitoring: heart rate, continuous pulse ox, blood pressure and cardiac monitor Approach: midline Location: L3-4 Injection technique: single-shot Needle Needle type: Introducer and Pencil-Tip  Needle gauge: 24 G Needle length: 9 cm Additional Notes Negative paresthesia. Negative blood return. Positive free-flowing CSF. Expiration date of kit checked and confirmed. Patient tolerated procedure well, without complications.

## 2016-07-31 NOTE — Anesthesia Post-op Follow-up Note (Cosign Needed)
Anesthesia QCDR form completed.        

## 2016-07-31 NOTE — NC FL2 (Signed)
[place] LEVEL OF CARE SCREENING TOOL     IDENTIFICATION  Patient Name: Molly Mitchell Birthdate: [birth year] Sex: female Admission Date (Current Location): 07/31/2016  [place] and [place] Number:  Engineering geologist and Address:  [facility], [address] [place], [place] [address]      Provider Number: [id number]  Attending Physician Name and Address:  Dereck Leep, MD  Relative Name and Phone Number:       Current Level of Care: Hospital Recommended Level of Care: [place] Prior Approval Number:    Date Approved/Denied:   PASRR Number:  [id number])  Discharge Plan: SNF    Current Diagnoses: Patient Active Problem List   Diagnosis Date Noted  . S/P total knee arthroplasty 07/31/2016  . Encounter for Medicare annual wellness exam 07/27/2015  . Acute bronchitis 05/13/2014  . Dysfunction of eustachian tube 05/13/2014  . Cardiac conduction disorder 05/13/2014  . Ache in joint 05/13/2014  . AB (asthmatic bronchitis) 05/13/2014  . Gonalgia 05/13/2014  . Asthma, cough variant 05/13/2014  . Screening for depression 05/13/2014  . Electrolyte imbalance 05/13/2014  . Fatigue 05/13/2014  . H/O total knee replacement 05/13/2014  . HLD (hyperlipidemia) 05/13/2014  . Elevated WBC count 05/13/2014  . Drug withdrawal syndrome to opium ([place]) 05/13/2014  . Arthritis of knee, degenerative 05/13/2014  . Osteopenia 05/13/2014  . Abdominal pain, right upper quadrant 05/13/2014  . Herpes zona 05/13/2014  . Periapical abscess without sinus tract 05/13/2014  . Pain in thoracic spine 05/13/2014  . MI (mitral incompetence) 12/29/2013  . Idiopathic insomnia 10/16/2013  . Polypharmacy 10/16/2013  . Dizziness 07/29/2013  . Long term current use of systemic steroids 07/21/2013  . History of prolonged Q-T interval on ECG 05/22/2013  . Benign essential HTN 05/22/2013  . Personal history of other diseases of the circulatory  system 05/22/2013  . 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 Combined fat and carbohydrate induced hyperlipemia 03/31/2013  . S/P lumbar spine operation 06/20/2012  . Cervical post-laminectomy syndrome 06/20/2012  . Status post lumbar spine operation 06/20/2012  . Tumoral calcinosis 10/31/2011  . Connective tissue disease, undifferentiated ([place]) 10/31/2011  . Effusion of knee 10/31/2011  . Chondrocalcinosis due to dicalcium phosphate crystals 10/31/2011  . Undifferentiated connective tissue disease ([place]) 10/31/2011  . Chronic cervical pain 03/28/2011  . Arthralgia of ankle or foot 11/30/2010  . Neoplasm of breast 10/26/2008  . Acne 07/24/2008  . Dizziness and giddiness 07/24/2008  . Cervical pain 05/12/2008  . Dyssomnia 05/12/2008  . Essential (primary) hypertension 05/12/2008    Orientation RESPIRATION BLADDER Height & Weight     Self, Time, Situation, Place  Normal Continent Weight: 164 lb 3.2 oz (74.5 kg) Height:  5\' 2"  (157.5 cm)  BEHAVIORAL SYMPTOMS/MOOD NEUROLOGICAL BOWEL NUTRITION STATUS   (none)  (none) Continent Diet (Regular Diet. )  AMBULATORY STATUS COMMUNICATION OF NEEDS Skin   Extensive Assist Verbally Surgical wounds (Incision: Left Knee )                       Personal Care Assistance Level of Assistance  Bathing, Feeding, Dressing Bathing Assistance: Limited assistance Feeding assistance: Independent Dressing Assistance: Limited assistance     Functional Limitations Info  Sight, Hearing, Speech Sight Info: Adequate Hearing Info: Adequate Speech Info: Adequate    SPECIAL CARE FACTORS FREQUENCY  PT (By licensed PT), OT (By licensed OT)     PT Frequency:  (5) OT Frequency:  (  5)            Contractures      Additional Factors Info  Code Status, Allergies Code Status Info:  (Full Code. ) Allergies Info:  (Amitriptyline Hcl, Duricef  Cefadroxil, Erythromycin, Levofloxacin, Tetanus Toxoid)           Current  Medications (07/31/2016):  This is the current hospital active medication list Current Facility-Administered Medications  Medication Dose Route Frequency Provider Last Rate Last Dose  . 0.9 %  sodium chloride infusion   Intravenous Continuous Spiros Greenfeld, Laurice Record, MD 100 mL/hr at 07/31/16 1328    . 0.9 %  sodium chloride infusion   Intravenous Continuous [place], Amy, MD 0 mL/hr at 07/31/16 1011    . acetaminophen (OFIRMEV) IV 1,000 mg  1,000 mg Intravenous Q6H Eimi Viney, Laurice Record, MD   Stopped at 07/31/16 1423  . acetaminophen (TYLENOL) tablet 650 mg  650 mg Oral Q6H PRN Raunak Antuna, Laurice Record, MD       Or  . acetaminophen (TYLENOL) suppository 650 mg  650 mg Rectal Q6H PRN Jeanae Whitmill, Laurice Record, MD      . alum & mag hydroxide-simeth (MAALOX/MYLANTA) 200-200-20 MG/5ML suspension 30 mL  30 mL Oral Q4H PRN Marvis Bakken, Laurice Record, MD      . atenolol (TENORMIN) tablet 50 mg  50 mg Oral Daily Doreatha Offer, Laurice Record, MD      . bisacodyl (DULCOLAX) suppository 10 mg  10 mg Rectal Daily PRN Syniah Berne, Laurice Record, MD      . celecoxib (CELEBREX) capsule 200 mg  200 mg Oral Q12H Temisha Murley, Laurice Record, MD   200 mg at 07/31/16 1416  . cholecalciferol (VITAMIN D) tablet 1,000 Units  1,000 Units Oral Daily Margarit Minshall, Laurice Record, MD      . clindamycin (CLEOCIN) IVPB 600 mg  600 mg Intravenous Q6H Angelisa Winthrop, Laurice Record, MD   Stopped at 07/31/16 1447  . [START ON 08/01/2016] enoxaparin (LOVENOX) injection 30 mg  30 mg Subcutaneous Q12H Haide Klinker, Laurice Record, MD      . fentaNYL (SUBLIMAZE) 100 MCG/2ML injection           . ferrous sulfate tablet 325 mg  325 mg Oral BID WC Shunte Senseney, Laurice Record, MD      . magnesium hydroxide (MILK OF MAGNESIA) suspension 30 mL  30 mL Oral Daily PRN Quintavious Rinck, Laurice Record, MD      . menthol-cetylpyridinium (CEPACOL) lozenge 3 mg  1 lozenge Oral PRN Jareb Radoncic, Laurice Record, MD       Or  . phenol (CHLORASEPTIC) mouth spray 1 spray  1 spray Mouth/Throat PRN Keviana Guida, Laurice Record, MD      . metoCLOPramide (REGLAN) tablet 10 mg  10 mg Oral TID AC & HS Sherill Mangen, Laurice Record, MD       . morphine 2 MG/ML injection 2 mg  2 mg Intravenous Q2H PRN Taurean Ju, Laurice Record, MD      . multivitamin with minerals tablet 1 tablet  1 tablet Oral Daily Jamieon Lannen, Laurice Record, MD      . nystatin ointment (MYCOSTATIN) 1 application  1 application Topical Daily PRN Darold Miley, Laurice Record, MD      . ondansetron (ZOFRAN) tablet 4 mg  4 mg Oral Q6H PRN Yasmin Dibello, Laurice Record, MD       Or  . ondansetron (ZOFRAN) injection 4 mg  4 mg Intravenous Q6H PRN Chermaine Schnyder, Laurice Record, MD      . oxyCODONE (Oxy IR/ROXICODONE) immediate release tablet 5-10 mg  5-10 mg Oral  Q4H PRN Dereck Leep, MD   5 mg at 07/31/16 1327  . pantoprazole (PROTONIX) EC tablet 40 mg  40 mg Oral Daily Breckin Savannah, Laurice Record, MD      . pravastatin (PRAVACHOL) tablet 40 mg  40 mg Oral QHS Gerome Kokesh, Laurice Record, MD      . senna-docusate (Senokot-S) tablet 1 tablet  1 tablet Oral BID Alaya Iverson, Laurice Record, MD      . sodium phosphate (FLEET) 7-19 GM/118ML enema 1 enema  1 enema Rectal Once PRN Charley Miske, Laurice Record, MD      . traMADol Veatrice Bourbon) tablet 50-100 mg  50-100 mg Oral Q4H PRN Dereck Leep, MD   100 mg at 07/31/16 1522  . triamterene-hydrochlorothiazide (MAXZIDE-25) 37.5-25 MG per tablet 1 tablet  1 tablet Oral Daily Tyshawn Keel, Laurice Record, MD      . vitamin C (ASCORBIC ACID) tablet 1,000 mg  1,000 mg Oral Daily Aaryn Sermon, Laurice Record, MD      . vitamin E capsule 400 Units  400 Units Oral Daily Haji Delaine, Laurice Record, MD         Discharge Medications: Please see discharge summary for a list of discharge medications.  Relevant Imaging Results:  Relevant Lab Results:   Additional Information  (SSN: [id number])  Sample, Veronia Beets, LCSW

## 2016-07-31 NOTE — Progress Notes (Signed)
PHARMACIST - PHYSICIAN ORDER COMMUNICATION  CONCERNING: P&T Medication Policy on Herbal Medications  DESCRIPTION:  This patient's order for: calcium/magnesium/zinc  has been noted.  This product(s) is classified as an "herbal" or natural product. Due to a lack of definitive safety studies or FDA approval, nonstandard manufacturing practices, plus the potential risk of unknown drug-drug interactions while on inpatient medications, the Pharmacy and Therapeutics Committee does not permit the use of "herbal" or natural products of this type within [facility].   ACTION TAKEN: The pharmacy department is unable to verify this order at this time and your patient has been informed of this safety policy. Please reevaluate patient's clinical condition at discharge and address if the herbal or natural product(s) should be resumed at that time.

## 2016-07-31 NOTE — OR Nursing (Signed)
Explanted tibial component and a spacer was removed  from left knee.

## 2016-07-31 NOTE — H&P (Signed)
The patient has been re-examined, and the chart reviewed, and there have been no interval changes to the documented history and physical.    The risks, benefits, and alternatives have been discussed at length. The patient expressed understanding of the risks benefits and agreed with plans for surgical intervention.  Mannat Benedetti P. Lynise Porr, Jr. M.D.    

## 2016-08-01 ENCOUNTER — Encounter: Payer: Self-pay | Admitting: Orthopedic Surgery

## 2016-08-01 ENCOUNTER — Encounter: Payer: Self-pay | Admitting: Family Medicine

## 2016-08-01 ENCOUNTER — Ambulatory Visit: Payer: Self-pay

## 2016-08-01 LAB — BASIC METABOLIC PANEL
ANION GAP: 7 (ref 5–15)
BUN: 29 mg/dL — AB (ref 6–20)
CALCIUM: 8.4 mg/dL — AB (ref 8.9–10.3)
CO2: 24 mmol/L (ref 22–32)
CREATININE: 1.11 mg/dL — AB (ref 0.44–1.00)
Chloride: 104 mmol/L (ref 101–111)
GFR calc Af Amer: 57 mL/min — ABNORMAL LOW (ref >60)
GFR calc non Af Amer: 49 mL/min — ABNORMAL LOW (ref >60)
GLUCOSE: 162 mg/dL — AB (ref 65–99)
Potassium: 3.4 mmol/L — ABNORMAL LOW (ref 3.5–5.1)
Sodium: 135 mmol/L (ref 135–145)

## 2016-08-01 LAB — CBC
HEMATOCRIT: 36.7 % (ref 35.0–47.0)
Hemoglobin: 12.8 g/dL (ref 12.0–16.0)
MCH: 31.9 pg (ref 26.0–34.0)
MCHC: 34.7 g/dL (ref 32.0–36.0)
MCV: 91.8 fL (ref 80.0–100.0)
Platelets: 195 10*3/uL (ref 150–440)
RBC: 4 MIL/uL (ref 3.80–5.20)
RDW: 14.4 % (ref 11.5–14.5)
WBC: 16 10*3/uL — ABNORMAL HIGH (ref 3.6–11.0)

## 2016-08-01 MED ORDER — TRAMADOL HCL 50 MG PO TABS: 50.0000 mg | ORAL_TABLET | ORAL | 0 refills | Status: DC | PRN

## 2016-08-01 MED ORDER — ENOXAPARIN SODIUM 30 MG/0.3ML ~~LOC~~ SOLN: 40.0000 mg | SUBCUTANEOUS | 0 refills | Status: DC

## 2016-08-01 MED ORDER — OXYCODONE HCL 5 MG PO TABS: 5.0000 mg | ORAL_TABLET | ORAL | 0 refills | Status: DC | PRN

## 2016-08-01 MED ORDER — POTASSIUM CHLORIDE 20 MEQ PO PACK
20.0000 meq | PACK | Freq: Three times a day (TID) | ORAL | Status: DC
  Administered 2016-08-01: 20 meq via ORAL
  Filled 2016-08-01: qty 1

## 2016-08-01 MED ORDER — CALCIUM CITRATE 950 (200 CA) MG PO TABS
200.0000 mg | ORAL_TABLET | Freq: Every day | ORAL | Status: DC
  Administered 2016-08-01: 200 mg via ORAL
  Filled 2016-08-01: qty 1

## 2016-08-01 NOTE — Discharge Summary (Signed)
Physician Discharge Summary  Patient ID: Molly Mitchell MRN: [id number] DOB/AGE: 04-27-1945 71 y.o.  Admit date: 07/31/2016 Discharge date: 08/01/2016  Admission Diagnoses:  loose orthopedic implant   Discharge Diagnoses: Patient Active Problem List   Diagnosis Date Noted  . S/P total knee arthroplasty 07/31/2016  . Encounter for Medicare annual wellness exam 07/27/2015  . Acute bronchitis 05/13/2014  . Dysfunction of eustachian tube 05/13/2014  . Cardiac conduction disorder 05/13/2014  . Ache in joint 05/13/2014  . AB (asthmatic bronchitis) 05/13/2014  . Gonalgia 05/13/2014  . Asthma, cough variant 05/13/2014  . Screening for depression 05/13/2014  . Electrolyte imbalance 05/13/2014  . Fatigue 05/13/2014  . H/O total knee replacement 05/13/2014  . HLD (hyperlipidemia) 05/13/2014  . Elevated WBC count 05/13/2014  . Drug withdrawal syndrome to opium ([place]) 05/13/2014  . Arthritis of knee, degenerative 05/13/2014  . Osteopenia 05/13/2014  . Abdominal pain, right upper quadrant 05/13/2014  . Herpes zona 05/13/2014  . Periapical abscess without sinus tract 05/13/2014  . Pain in thoracic spine 05/13/2014  . MI (mitral incompetence) 12/29/2013  . Idiopathic insomnia 10/16/2013  . Polypharmacy 10/16/2013  . Dizziness 07/29/2013  . Long term current use of systemic steroids 07/21/2013  . History of prolonged Q-T interval on ECG 05/22/2013  . Benign essential HTN 05/22/2013  . Personal history of other diseases of the circulatory system 05/22/2013  . 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 Combined fat and carbohydrate induced hyperlipemia 03/31/2013  . S/P lumbar spine operation 06/20/2012  . Cervical post-laminectomy syndrome 06/20/2012  . Status post lumbar spine operation 06/20/2012  . Tumoral calcinosis 10/31/2011  . Connective tissue disease, undifferentiated ([place]) 10/31/2011  . Effusion of knee 10/31/2011  . Chondrocalcinosis due to dicalcium  phosphate crystals 10/31/2011  . Undifferentiated connective tissue disease ([place]) 10/31/2011  . Chronic cervical pain 03/28/2011  . Arthralgia of ankle or foot 11/30/2010  . Neoplasm of breast 10/26/2008  . Acne 07/24/2008  . Dizziness and giddiness 07/24/2008  . Cervical pain 05/12/2008  . Dyssomnia 05/12/2008  . Essential (primary) hypertension 05/12/2008    Past Medical History:  Diagnosis Date  . Arthritis   . HLD (hyperlipidemia)   . Hypertension      Transfusion: No transfusions during this admission   Consultants (if any):   Discharged Condition: Improved  Hospital Course: Molly Mitchell is an 71 y.o. female who was admitted 07/31/2016 with a diagnosis of loose tibial implant status post left total knee arthroplasty performed 2015 and went to the operating room on 07/31/2016 and underwent the above named procedures.    Surgeries:Procedure(s): TOTAL KNEE REVISION on 07/31/2016  PRE-OPERATIVE DIAGNOSIS: Loose tibial implant status post left total knee arthroplasty  POST-OPERATIVE DIAGNOSIS:  Same  PROCEDURE:  Left knee revision arthroplasty (tibial component)  SURGEON:  Molly Mitchell. M.D.  ASSISTANT:  Molly Peper, PA (present and scrubbed throughout the case, critical for assistance with exposure, retraction, instrumentation, and closure)  ANESTHESIA: spinal  ESTIMATED BLOOD LOSS: 50 mL  FLUIDS REPLACED: 1450 mL of crystalloid  TOURNIQUET TIME: 158 minutes  DRAINS: 2 medium Hemovac drains  IMPLANTS UTILIZED: DePuy size 2.5 MBT revision tibial component, 29 mm MBT revision metaphyseal sleeve (porous), size 2 10 mm step wedge (medial), 75 mm x 14 mm universal fluted stem, and a 15 mm stabilized rotating platform polyethylene insert.  INDICATIONS FOR SURGERY: Molly Mitchell is a 71 y.o. year old female with a who underwent left total knee arthroplasty approximately 3 years  ago. More recently she was having some medial knee pain and noted progressive  varus deformity to the knee. X-rays demonstrated findings consistent with loosening of the tibial component with collapse of the medial aspect of the tibia. After discussion of the risks and benefits of surgical intervention, the patient expressed understanding of the risks benefits and agree with plans for revision knee arthroplasty.   The risks, benefits, and alternatives were discussed at length including but not limited to the risks of infection, bleeding, nerve injury, stiffness, blood clots, the need for revision surgery, cardiopulmonary complications, among others, and they were willing to proceed.  Patient tolerated the surgery well. No complications .Patient was taken to PACU where she was stabilized and then transferred to the orthopedic floor.  Patient started on Lovenox 30 mg q 12 hrs. Foot pumps applied bilaterally at 80 mm hgb. Heels elevated off bed with rolled towels. No evidence of DVT. Calves non tender. Negative [place]. Physical therapy started on day #1 for gait training and transfer with OT starting on  day #1 for ADL and assisted devices. Patient has done well with therapy. Ambulated greater than 360 feet upon being discharged.  Patient's IV And Foley were discontinued on day #1 with Hemovac being discontinued on day #2. Dressing was changed on day 2 prior to patient being discharged   She was given perioperative antibiotics:  Anti-infectives    Start     Dose/Rate Route Frequency Ordered Stop   07/31/16 1400  clindamycin (CLEOCIN) IVPB 600 mg     600 mg 100 mL/hr over 30 Minutes Intravenous Every 6 hours 07/31/16 1254 08/01/16 1359   07/31/16 0600  clindamycin (CLEOCIN) IVPB 900 mg     900 mg 100 mL/hr over 30 Minutes Intravenous On call to O.R. 07/30/16 2247 07/31/16 0745   07/31/16 0554  clindamycin (CLEOCIN) 900 MG/50ML IVPB    Comments:  [name]   : cabinet override      07/31/16 0554 07/31/16 0735    .  She was fitted with AV 1 compression foot pump  devices, instructed on heel pumps, early ambulation, and fitted with TED stockings bilaterally for DVT prophylaxis.  She benefited maximally from the hospital stay and there were no complications.    Recent vital signs:  Vitals:   08/01/16 0415 08/01/16 0740  BP: (!) 132/57 (!) 147/54  Pulse: (!) 50 (!) 54  Resp: 18 16  Temp: 98 F (36.7 C) 97.7 F (36.5 C)    Recent laboratory studies:  Lab Results  Component Value Date   HGB 12.8 08/01/2016   HGB 14.9 07/31/2016   HGB 15.6 07/17/2016   Lab Results  Component Value Date   WBC 16.0 (H) 08/01/2016   PLT 195 08/01/2016   Lab Results  Component Value Date   INR 0.96 07/17/2016   Lab Results  Component Value Date   NA 135 08/01/2016   K 3.4 (L) 08/01/2016   CL 104 08/01/2016   CO2 24 08/01/2016   BUN 29 (H) 08/01/2016   CREATININE 1.11 (H) 08/01/2016   GLUCOSE 162 (H) 08/01/2016    Discharge Medications:   Allergies as of 08/01/2016      Reactions   Amitriptyline Hcl    Duricef  [cefadroxil]    Erythromycin    Levofloxacin    Severe Shoulder Pain   Tetanus Toxoid       Medication List    STOP taking these medications   ALEVE 220 MG tablet Generic drug:  naproxen sodium   aspirin EC 81 MG tablet     TAKE these medications   atenolol 50 MG tablet Commonly known as:  TENORMIN Take 1 tablet by mouth daily.   CALCIUM-MAGNESIUM-ZINC PO Take 2 tablets by mouth daily.   enoxaparin 30 MG/0.3ML injection Commonly known as:  LOVENOX Inject 0.4 mLs (40 mg total) into the skin daily.   EQL COQ10 300 MG Caps Generic drug:  Coenzyme Q10 Take 300 mg by mouth daily.   meloxicam 15 MG tablet Commonly known as:  MOBIC Take 1 tablet (15 mg total) by mouth daily.   Milk Thistle 300 MG Caps Take 600 mg by mouth daily.   multivitamin with minerals tablet Take 3 tablets by mouth daily.   nystatin ointment Commonly known as:  MYCOSTATIN Apply 1 application topically daily as needed (skin irritation).    OMEGA-3 FATTY ACIDS PO Take 2 capsules by mouth daily.   oxyCODONE 5 MG immediate release tablet Commonly known as:  Oxy IR/ROXICODONE Take 1-2 tablets (5-10 mg total) by mouth every 4 (four) hours as needed for severe pain or breakthrough pain.   pravastatin 40 MG tablet Commonly known as:  PRAVACHOL Take 40 mg by mouth at bedtime.   traMADol 50 MG tablet Commonly known as:  ULTRAM Take 1-2 tablets (50-100 mg total) by mouth every 4 (four) hours as needed for moderate pain.   triamterene-hydrochlorothiazide 37.5-25 MG tablet Commonly known as:  MAXZIDE-25 TAKE 1 TABLET EVERY DAY   vitamin C 1000 MG tablet Take 1,000 mg by mouth daily.   Vitamin D3 1000 units Caps Take 1 capsule by mouth daily.   vitamin E 400 UNIT capsule 1 capsule daily.            Durable Medical Equipment        Start     Ordered   07/31/16 1255  DME Walker rolling  Once    Question:  Patient needs a walker to treat with the following condition  Answer:  Total knee replacement status   07/31/16 1254   07/31/16 1255  DME Bedside commode  Once    Question:  Patient needs a bedside commode to treat with the following condition  Answer:  Total knee replacement status   07/31/16 1254      Diagnostic Studies: Dg Knee Left Port  Result Date: 07/31/2016 CLINICAL DATA:  Status post left knee replacement EXAM: PORTABLE LEFT KNEE - 1-2 VIEW COMPARISON:  03/03/2013 FINDINGS: Left knee replacement is seen. The tibial component is new from the prior exam. No acute bony abnormality is seen. No soft tissue abnormality is noted. IMPRESSION: Status post tibial revision. Electronically Signed   By: Inez Catalina M.D.   On: 07/31/2016 12:49    Disposition:     Follow-up Information    Watt Climes, PA On 08/15/2016.   Specialty:  Physician Assistant Why:  at 10:45am Contact information: [place] [address] [phone]        Dereck Leep, MD On  09/12/2016.   Specialty:  Orthopedic Surgery Why:  at 10:45am Contact information: [place] [address] [phone]            Signed: Watt Climes 08/01/2016, 7:48 AM

## 2016-08-01 NOTE — Care Management Note (Signed)
Case Management Note  Patient Details  Name: GINNETTE GATES MRN: [id number] Date of Birth: [birth year]  Subjective/Objective:   POD #  1 Left TKR. PT recommending OP PT.  TC to Vance Peper, PA. He is agreeable for patient to participate in OP PT. It is anticipated patient will be discharged home today. Met with patient at bedside to discuss discharge planning. She is agreeable to OP PT. TC to [facility] PT Dept.Appointment for first OP PT scheduled for 08/02/2016 @ 10 am. Patient updated. Added to follow up appointments on discharge instructions. Patient states she has a walker and 2 canes at home. Lives with her spouse who will be her caregiver. Pharmacy : [facility] [address] [phone]. Called Lovenox 40 mg # 14 no refills.Marland Kitchen PCP is Dr.Chrismon.                  Action/Plan: discharge today with OP PT. No DME, Lovenox called in.   Expected Discharge Date:  08/02/16               Expected Discharge Plan:  OP Rehab  In-House Referral:     Discharge planning Services  CM Consult  Post Acute Care Choice:    Choice offered to:  Patient  DME Arranged:    DME Agency:     HH Arranged:    [place] Agency:     Status of Service:  Completed, signed off  If discussed at [employer] of Stay Meetings, dates discussed:    Additional Comments:  Jolly Mango, RN 08/01/2016, 8:26 AM

## 2016-08-01 NOTE — Progress Notes (Signed)
Discharge summary reviewed with verbal understanding. Answered all questions. 2 narcotic Rxs given upon discharge. Belongings packed. Escorted to personal vehicle via wc by ortho staff

## 2016-08-01 NOTE — Anesthesia Postprocedure Evaluation (Signed)
Anesthesia Post Note  Patient: [name]  Procedure(s) Performed: Procedure(s) (LRB): TOTAL KNEE REVISION (Left)  Patient location during evaluation: Nursing Unit Anesthesia Type: Spinal Level of consciousness: awake, awake and alert and oriented Pain management: pain level controlled Vital Signs Assessment: post-procedure vital signs reviewed and stable Respiratory status: spontaneous breathing, nonlabored ventilation and respiratory function stable Cardiovascular status: stable Postop Assessment: no headache, no backache, adequate PO intake and no signs of nausea or vomiting Anesthetic complications: no     Last Vitals:  Vitals:   08/01/16 0039 08/01/16 0415  BP: (!) 104/43 (!) 132/57  Pulse: (!) 41 (!) 50  Resp: 18 18  Temp: 37.1 C 36.7 C    Last Pain:  Vitals:   08/01/16 0616  TempSrc:   PainSc: 4                  Docie Abramovich,  Clemie General R

## 2016-08-01 NOTE — Progress Notes (Signed)
Patient appears to have slept well with no signs of distress.. Pain is controlled with current medications.

## 2016-08-01 NOTE — Evaluation (Signed)
Occupational Therapy Evaluation Patient Details Name: Molly Mitchell MRN: [id number] DOB: 1945/12/19 Today's Date: 08/01/2016    History of Present Illness Pt is a 71 y/o F who had L TKA completed ~3 years ago and is now s/p L TKA revision.    Clinical Impression   Pt seen for OT evaluation this date. Pt was independent at baseline, only recently using [facility] for ambulation secondary to pain in L knee. Pt stays active at the gym Monday through Friday, enjoys gardening, and attends church twice/wk. Pt eager to return to PLOF. Pt presents with 0/10 pain, and at supervision level for functional mobility and ADL tasks with occasional verbal cues for hand placement to maximize safety during transitional movements and education/training provided in use of AE to support independence with LB ADL tasks as well as compression stocking mgt. Pt verbalized understanding of all education provided, is eager to return home and confident that spouse will be able to provide sufficient support as needed. All education/training provided, will sign off. Anticipate no OT needs following discharge from the hospital.      Follow Up Recommendations  No OT follow up    Equipment Recommendations  None recommended by OT    Recommendations for Other Services       Precautions / Restrictions Precautions Precautions: Fall Restrictions Weight Bearing Restrictions: Yes LLE Weight Bearing: Weight bearing as tolerated      Mobility Bed Mobility               General bed mobility comments: deferred, pt up in recliner for session  Transfers Overall transfer level: Needs assistance Equipment used: Rolling walker (2 wheeled) Transfers: Sit to/from Stand Sit to Stand: Supervision         General transfer comment: Supervision for safety and cues for proper hand placement as pt initially reaches for RW with BUEs to stand.      Balance Overall balance assessment: Needs assistance Sitting-balance support: No  upper extremity supported;Feet supported Sitting balance-Leahy Scale: Good     Standing balance support: During functional activity;Bilateral upper extremity supported Standing balance-Leahy Scale: Good                             ADL either performed or assessed with clinical judgement   ADL Overall ADL's : Needs assistance/impaired Eating/Feeding: Set up;Sitting   Grooming: Standing;Supervision/safety   Upper Body Bathing: Sitting;Set up   Lower Body Bathing: Sit to/from stand;Supervison/ safety;With caregiver independent assisting   Upper Body Dressing : Set up   Lower Body Dressing: Minimal assistance;With caregiver independent assisting Lower Body Dressing Details (indicate cue type and reason): pt educated in AE for LB dressing tasks as well as TED hose mgt, pt verbalized understanding, spouse able to assist at home Toilet Transfer: Supervision/safety;Comfort height toilet Toilet Transfer Details (indicate cue type and reason): VCs for safe hand placement Toileting- Clothing Manipulation and Hygiene: Independent   Tub/ Shower Transfer: Min guard;Rolling walker   Functional mobility during ADLs: Rolling walker;Supervision/safety General ADL Comments: pt generally supervision to PRN min assist level for LB ADL tasks, supervision for functional mobility     Vision Baseline Vision/History: Wears glasses Wears Glasses: At all times Patient Visual Report: No change from baseline Vision Assessment?: No apparent visual deficits     Perception     Praxis      Pertinent Vitals/Pain Pain Assessment: No/denies pain Pain Intervention(s): Monitored during session;Premedicated before session;Repositioned;Ice applied  Hand Dominance Right   Extremity/Trunk Assessment Upper Extremity Assessment Upper Extremity Assessment: Overall WFL for tasks assessed   Lower Extremity Assessment Lower Extremity Assessment: LLE deficits/detail LLE Deficits / Details:  Limited ROM as expected s/p L TKA revision.  Able to perform SLR, no KI donned   Cervical / Trunk Assessment Cervical / Trunk Assessment: Normal   Communication Communication Communication: No difficulties   Cognition Arousal/Alertness: Awake/alert Behavior During Therapy: WFL for tasks assessed/performed Overall Cognitive Status: Within Functional Limits for tasks assessed                                     General Comments       Exercises Other Exercises Other Exercises: pt educated in home/routines modifications to maximize safety and functional independence with ADL    Shoulder Instructions      Home Living Family/patient expects to be discharged to:: Private residence Living Arrangements: Spouse/significant other Available Help at Discharge: Family;Available 24 hours/day Type of Home: House Home Access: Stairs to enter [employer] of Steps: 2 Entrance Stairs-Rails: None Home Layout: One level     Bathroom Shower/Tub: Teacher, early years/pre: Handicapped height (standard + toilet riser)     Home Equipment: Walker - 2 wheels;Shower seat;Cane - single point;Toilet riser;Hand held shower head;Adaptive equipment Adaptive Equipment: Reacher        Prior Functioning/Environment Level of Independence: Independent with assistive device(s)        Comments: Pt was independent without any recent falls.  She recently has been ambulating with her cane due to pain.  Pt goes to the gym daily for strengthening and cardio exercise, enjoys church and gardening        OT Problem List:        OT Treatment/Interventions:      OT Goals(Current goals can be found in the care plan section) Acute Rehab OT Goals Patient Stated Goal: to get stronger and get back to the gym OT Goal Formulation: All assessment and education complete, DC therapy Time For Goal Achievement: 08/15/16 Potential to Achieve Goals: Good  OT Frequency:     Barriers  to D/C:            Co-evaluation              AM-PAC PT "6 Clicks" Daily Activity     Outcome Measure Help from another person eating meals?: None Help from another person taking care of personal grooming?: None Help from another person toileting, which includes using toliet, bedpan, or urinal?: A Little Help from another person bathing (including washing, rinsing, drying)?: A Little Help from another person to put on and taking off regular upper body clothing?: None Help from another person to put on and taking off regular lower body clothing?: A Little 6 Click Score: 21   End of Session Equipment Utilized During Treatment: Rolling walker  Activity Tolerance: Patient tolerated treatment well Patient left: in chair;with call bell/phone within reach;with chair alarm set;Other (comment) (polar care in place)  OT Visit Diagnosis: Other abnormalities of gait and mobility (R26.89)                Time: 1245-8099 OT Time Calculation (min): 32 min Charges:  OT General Charges $OT Visit: 1 Procedure OT Evaluation $OT Eval Low Complexity: 1 Procedure OT Treatments $Self Care/Home Management : 8-22 mins G-Codes:     Jeni Salles, MPH, MS, OTR/L  ascom [phone] 08/01/16, 10:21 AM

## 2016-08-01 NOTE — Progress Notes (Signed)
Clinical Social Worker (CSW) received SNF consult. PT is recommending outpatient PT. RN case manager aware of above. Please reconsult if future social work needs arise. CSW signing off.   Anamika Kueker, LCSW [phone]  

## 2016-08-01 NOTE — Progress Notes (Signed)
PT Cancellation Note  Patient Details Name: Molly Mitchell MRN: [id number] DOB: June 30, 1945   Cancelled Treatment:    Reason Eval/Treat Not Completed: Other (comment). Treatment attempted; pt has just received an enema, so that she may discharge home once it has taken effect. Pt does not wish PT at this time.    Larae Grooms, PTA 08/01/2016, 3:58 PM

## 2016-08-01 NOTE — Discharge Instructions (Signed)

## 2016-08-01 NOTE — Care Management Note (Signed)
Case Management Note  Patient Details  Name: Molly Mitchell MRN: [id number] Date of Birth: [birth year]  Subjective/Objective:  Cost of Lovenox is $ 111.87. Patient updated and denies issues paying for medication.                   Action/Plan:   Expected Discharge Date:  08/02/16               Expected Discharge Plan:  OP Rehab  In-House Referral:     Discharge planning Services  CM Consult  Post Acute Care Choice:    Choice offered to:  Patient  DME Arranged:    DME Agency:     HH Arranged:    [place] Agency:     Status of Service:  Completed, signed off  If discussed at [employer] of Stay Meetings, dates discussed:    Additional Comments:  Jolly Mango, RN 08/01/2016, 2:42 PM

## 2016-08-01 NOTE — Progress Notes (Signed)
Physical Therapy Treatment Patient Details Name: Molly Mitchell MRN: [id number] DOB: Feb 14, 1945 Today's Date: 08/01/2016    History of Present Illness Pt is a 71 y/o F who had L TKA completed ~3 years ago and is now s/p L TKA revision.     PT Comments    Molly Mitchell made excellent progress with mobility this session.  She made gains in her L knee flexion AAROM, measuring at 96 deg this session.  She ambulated with a RW x100 ft with supervision for safety and 100 ft with SPC with intermittent min assist due to instability.  She completed stair training with assist holding RW in place.    Follow Up Recommendations  Outpatient PT     Equipment Recommendations  None recommended by PT    Recommendations for Other Services       Precautions / Restrictions Precautions Precautions: Fall Restrictions Weight Bearing Restrictions: Yes LLE Weight Bearing: Weight bearing as tolerated    Mobility  Bed Mobility Overal bed mobility: Modified Independent             General bed mobility comments: Mild increased time and effort.  No physical assist or cues needed.  Transfers Overall transfer level: Needs assistance Equipment used: Rolling walker (2 wheeled) Transfers: Sit to/from Stand Sit to Stand: Supervision         General transfer comment: Supervision for safety.  Pt requires cues for proper hand placement when sitting but performs safely when standing.  Ambulation/Gait Ambulation/Gait assistance: Supervision;Min assist Ambulation Distance (Feet): 200 Feet Assistive device: Rolling walker (2 wheeled);Straight cane Gait Pattern/deviations: Step-through pattern;Decreased stance time - left;Decreased step length - right;Decreased stride length;Decreased weight shift to left;Antalgic Gait velocity: Mildly decreased Gait velocity interpretation: Below normal speed for age/gender General Gait Details: Pt recalled upright posture and forward gaze and immediately demonstrated a  step through gait pattern without cues needed.  She recalled focusing on L heel strike which she did well without needing correction.  Pt ambulated 100 ft with RW with supervision and 100 ft with SPC with intermittent min assist due to instability which improved as the pt continued to ambulate. Pt verbalized understanding that she is to use SPC only when working with PT and to otherwise use RW.   Stairs Stairs: Yes   Stair Management: No rails;Step to pattern;Backwards;Forwards;With walker Number of Stairs: 2 (x2) General stair comments: Demonstrated and pt completed stair training with backward technique with min assist to hold RW in place.  Pt more steady with this technique.  Pt then trialed forwards with 1 person HHA with mild instability.  Wheelchair Mobility    Modified Rankin (Stroke Patients Only)       Balance Overall balance assessment: Needs assistance Sitting-balance support: No upper extremity supported;Feet supported Sitting balance-Leahy Scale: Good     Standing balance support: No upper extremity supported;During functional activity Standing balance-Leahy Scale: Fair Standing balance comment: Pt able to stand statically without UE support but requires UE support for dynamic activities                            Cognition Arousal/Alertness: Awake/alert Behavior During Therapy: WFL for tasks assessed/performed Overall Cognitive Status: Within Functional Limits for tasks assessed                                        Exercises  Total Joint Exercises Quad Sets: Strengthening;Both;10 reps;Supine;Other (comment) (with 5 second holds) Hip ABduction/ADduction: AROM;Strengthening;Left;10 reps;Supine Straight Leg Raises: AROM;Left;10 reps;Supine Long Arc Quad: AROM;Strengthening;Left;10 reps;Seated Knee Flexion: AAROM;Left;Seated;Other reps (comment);5 reps;Other (comment) (with 5 second holds) Goniometric ROM: 96 deg L knee flexion  AAROM Other Exercises Other Exercises: pt educated in home/routines modifications to maximize safety and functional independence with ADL     General Comments        Pertinent Vitals/Pain Pain Assessment: Faces Faces Pain Scale: Hurts little more Pain Location: L knee  Pain Descriptors / Indicators: Aching;Discomfort Pain Intervention(s): Limited activity within patient's tolerance;Monitored during session;Repositioned    Home Living Family/patient expects to be discharged to:: Private residence Living Arrangements: Spouse/significant other Available Help at Discharge: Family;Available 24 hours/day Type of Home: House Home Access: Stairs to enter Entrance Stairs-Rails: None Home Layout: One level Home Equipment: Environmental consultant - 2 wheels;Shower seat;Cane - single point;Toilet riser;Hand held shower head;Adaptive equipment      Prior Function Level of Independence: Independent with assistive device(s)      Comments: Pt was independent without any recent falls.  She recently has been ambulating with her cane due to pain.  Pt goes to the gym daily for strengthening and cardio exercise, enjoys church and gardening   PT Goals (current goals can now be found in the care plan section) Acute Rehab PT Goals Patient Stated Goal: to get stronger and get back to the gym PT Goal Formulation: With patient Time For Goal Achievement: 08/14/16 Potential to Achieve Goals: Good Progress towards PT goals: Progressing toward goals    Frequency    BID      PT Plan Current plan remains appropriate    Co-evaluation              AM-PAC PT "6 Clicks" Daily Activity  Outcome Measure  Difficulty turning over in bed (including adjusting bedclothes, sheets and blankets)?: None Difficulty moving from lying on back to sitting on the side of the bed? : None Difficulty sitting down on and standing up from a chair with arms (e.g., wheelchair, bedside commode, etc,.)?: A Little Help needed moving  to and from a bed to chair (including a wheelchair)?: A Little Help needed walking in hospital room?: A Little Help needed climbing 3-5 steps with a railing? : A Little 6 Click Score: 20    End of Session Equipment Utilized During Treatment: Gait belt Activity Tolerance: Patient tolerated treatment well Patient left: in chair;with call bell/phone within reach;with chair alarm set;Other (comment) (with polar care and bone foam in place) Nurse Communication: Mobility status PT Visit Diagnosis: Pain;Other abnormalities of gait and mobility (R26.89) Pain - Right/Left: Left Pain - part of body: Knee     Time: 7408-1448 PT Time Calculation (min) (ACUTE ONLY): 28 min  Charges:  $Gait Training: 8-22 mins $Therapeutic Exercise: 8-22 mins                    G Codes:       Collie Siad PT, DPT 08/01/2016, 10:36 AM

## 2016-08-01 NOTE — Progress Notes (Signed)
   Subjective: 1 Day Post-Op Procedure(s) (LRB): TOTAL KNEE REVISION (Left) Patient reports pain as mild.   Patient is well, and has had no acute complaints or problems Patient did extremely well yesterday with therapy and has had no increased discomfort today. Having no problems using the bone foam. Plan is to go Home after hospital stay. no nausea and no vomiting Patient denies any chest pains or shortness of breath. Objective: Vital signs in last 24 hours: Temp:  [97.4 F (36.3 C)-99.3 F (37.4 C)] 97.7 F (36.5 C) (07/17 0740) Pulse Rate:  [41-70] 54 (07/17 0740) Resp:  [13-18] 16 (07/17 0740) BP: (100-147)/(41-64) 147/54 (07/17 0740) SpO2:  [95 %-98 %] 98 % (07/17 0740) Weight:  [74.5 kg (164 lb 3.2 oz)] 74.5 kg (164 lb 3.2 oz) (07/16 1317) Heels are non tender and elevated off the bed using rolled towels as well as bone foam under the operative leg Intake/Output from previous day: 07/16 0701 - 07/17 0700 In: 3145 [P.O.:600; I.V.:1950; IV Piggyback:350] Out: 4917 [Urine:1210; Drains:130; Blood:50] Intake/Output this shift: No intake/output data recorded.   Recent Labs  07/31/16 0620 08/01/16 0432  HGB 14.9 12.8    Recent Labs  07/31/16 0620 08/01/16 0432  WBC 11.4* 16.0*  RBC 4.85 4.00  HCT 43.4 36.7  PLT 229 195    Recent Labs  07/31/16 0620 08/01/16 0432  NA 137 135  K 3.2* 3.4*  CL 99* 104  CO2 27 24  BUN 35* 29*  CREATININE 1.14* 1.11*  GLUCOSE 113* 162*  CALCIUM 9.6 8.4*   No results for input(s): LABPT, INR in the last 72 hours.  EXAM General - Patient is Alert, Appropriate and Oriented Extremity - Neurologically intact Neurovascular intact Sensation intact distally Intact pulses distally Dorsiflexion/Plantar flexion intact Compartment soft Dressing - dressing C/D/I Motor Function - intact, moving foot and toes well on exam.    Past Medical History:  Diagnosis Date  . Arthritis   . HLD (hyperlipidemia)   . Hypertension      Assessment/Plan: 1 Day Post-Op Procedure(s) (LRB): TOTAL KNEE REVISION (Left) Active Problems:   S/P total knee arthroplasty  Estimated body mass index is 30.03 kg/m as calculated from the following:   Height as of this encounter: 5\' 2"  (1.575 m).   Weight as of this encounter: 74.5 kg (164 lb 3.2 oz). Advance diet Up with therapy D/C IV fluids Discharge home with home health  Labs: Were reviewed DVT Prophylaxis - Lovenox, Foot Pumps and TED hose Weight-Bearing as tolerated to left leg D/C O2 and Pulse OX and try on Room Air Begin working on bowel movement Possibly discharged to home this afternoon if she has a bowel movement Labs tomorrow morning  Shalese Strahan [facility]. [place] [place] 08/01/2016, 7:43 AM

## 2016-08-02 DIAGNOSIS — R29898 Other symptoms and signs involving the musculoskeletal system: Secondary | ICD-10-CM | POA: Diagnosis not present

## 2016-08-02 DIAGNOSIS — Z96652 Presence of left artificial knee joint: Secondary | ICD-10-CM | POA: Diagnosis not present

## 2016-08-02 DIAGNOSIS — M25562 Pain in left knee: Secondary | ICD-10-CM | POA: Diagnosis not present

## 2016-08-02 DIAGNOSIS — M25662 Stiffness of left knee, not elsewhere classified: Secondary | ICD-10-CM | POA: Diagnosis not present

## 2016-08-03 ENCOUNTER — Ambulatory Visit (INDEPENDENT_AMBULATORY_CARE_PROVIDER_SITE_OTHER): Payer: Medicare HMO | Admitting: Physician Assistant

## 2016-08-03 VITALS — BP 132/64 | HR 86 | Temp 98.6°F | Resp 14 | Wt 167.0 lb

## 2016-08-03 DIAGNOSIS — R062 Wheezing: Secondary | ICD-10-CM | POA: Diagnosis not present

## 2016-08-03 MED ORDER — ALBUTEROL SULFATE HFA 108 (90 BASE) MCG/ACT IN AERS: 2.0000 | INHALATION_SPRAY | Freq: Four times a day (QID) | RESPIRATORY_TRACT | 2 refills | Status: DC | PRN

## 2016-08-03 NOTE — Progress Notes (Signed)
Patient: Molly Mitchell Female    DOB: [birth year]   71 y.o.   MRN: [id number] Visit Date: 08/03/2016  Today's Provider: Trinna Post, PA-C   Chief Complaint  Patient presents with  . Wheezing   Subjective:    HPI  Molly Mitchell is 71 y/o woman with history of asthma and right TKR one week ago presenting today with wheezing that she developed yesterday 08/02/16. Noticed it during the day and then last night. She is also lightheaded but states it could be due to the medications she is on post knee replacement done on Monday 07/31/16. Patient denies any other symptoms such as short of breath, fever, chills, cough, or drainage. No chest pain or tightness. Does endorse hoarse voice. Wheezing has resolved today. Patient is a former smoker, quit when she was 71 years old. She has been mobile since her surgery, stayed in the hospital one day. She does not have any lower extremity pain or swelling. Has pain at her surgery site.    Allergies  Allergen Reactions  . Amitriptyline Hcl   . Duricef  [Cefadroxil]   . Erythromycin   . Levofloxacin     Severe Shoulder Pain  . Tetanus Toxoid      Current Outpatient Prescriptions:  .  Ascorbic Acid (VITAMIN C) 1000 MG tablet, Take 1,000 mg by mouth daily. , Disp: , Rfl:  .  atenolol (TENORMIN) 50 MG tablet, Take 1 tablet by mouth daily. , Disp: , Rfl:  .  CALCIUM-MAGNESIUM-ZINC PO, Take 2 tablets by mouth daily. , Disp: , Rfl:  .  Cholecalciferol (VITAMIN D3) 1000 UNITS CAPS, Take 1 capsule by mouth daily. , Disp: , Rfl:  .  Coenzyme Q10 (EQL COQ10) 300 MG CAPS, Take 300 mg by mouth daily., Disp: , Rfl:  .  enoxaparin (LOVENOX) 30 MG/0.3ML injection, Inject 0.4 mLs (40 mg total) into the skin daily., Disp: 14 Syringe, Rfl: 0 .  meloxicam (MOBIC) 15 MG tablet, Take 1 tablet (15 mg total) by mouth daily., Disp: 90 tablet, Rfl: 3 .  Milk Thistle 300 MG CAPS, Take 600 mg by mouth daily., Disp: , Rfl:  .  Multiple Vitamins-Minerals  (MULTIVITAMIN WITH MINERALS) tablet, Take 3 tablets by mouth daily., Disp: , Rfl:  .  nystatin ointment (MYCOSTATIN), Apply 1 application topically daily as needed (skin irritation). , Disp: , Rfl:  .  OMEGA-3 FATTY ACIDS PO, Take 2 capsules by mouth daily. , Disp: , Rfl:  .  oxyCODONE (OXY IR/ROXICODONE) 5 MG immediate release tablet, Take 1-2 tablets (5-10 mg total) by mouth every 4 (four) hours as needed for severe pain or breakthrough pain., Disp: 60 tablet, Rfl: 0 .  pravastatin (PRAVACHOL) 40 MG tablet, Take 40 mg by mouth at bedtime., Disp: , Rfl:  .  traMADol (ULTRAM) 50 MG tablet, Take 1-2 tablets (50-100 mg total) by mouth every 4 (four) hours as needed for moderate pain., Disp: 60 tablet, Rfl: 0 .  triamterene-hydrochlorothiazide (MAXZIDE-25) 37.5-25 MG tablet, TAKE 1 TABLET EVERY DAY, Disp: 90 tablet, Rfl: 3 .  vitamin E 400 UNIT capsule, 1 capsule daily., Disp: , Rfl:   Review of Systems  Constitutional: Positive for fatigue. Negative for chills and fever.  Respiratory: Positive for wheezing. Negative for cough, choking and chest tightness.   Cardiovascular: Positive for leg swelling. Negative for chest pain.  Gastrointestinal: Positive for constipation. Negative for diarrhea, nausea and vomiting.  Musculoskeletal: Positive for arthralgias, gait problem and joint  swelling.  Neurological: Positive for weakness and light-headedness.    Social History  Substance Use Topics  . Smoking status: Former Smoker    Quit date: 04/17/1979  . Smokeless tobacco: Never Used  . Alcohol use 0.0 oz/week     Comment: 1-2 drinks a month    Objective:   BP 132/64   Pulse 86   Temp 98.6 F (37 C)   Resp 14   Wt 167 lb (75.8 kg)   SpO2 99%   BMI 30.54 kg/m  Vitals:   08/03/16 1005  BP: 132/64  Pulse: 86  Resp: 14  Temp: 98.6 F (37 C)  SpO2: 99%  Weight: 167 lb (75.8 kg)     Physical Exam  Constitutional: She is oriented to person, place, and time. She appears well-developed and  well-nourished.  HENT:  Right Ear: Tympanic membrane and external ear normal.  Left Ear: Tympanic membrane and external ear normal.  Nose: Nose normal.  Mouth/Throat: Oropharynx is clear and moist. No oropharyngeal exudate.  Cardiovascular: Normal rate, regular rhythm and normal heart sounds.   Pulses:      Dorsalis pedis pulses are 2+ on the right side, and 2+ on the left side.       Posterior tibial pulses are 2+ on the right side, and 2+ on the left side.  Pulmonary/Chest: Effort normal. No respiratory distress. She has wheezes. She has no rales.  One expiratory wheeze in LUF that resolved upon second examination.  Musculoskeletal: She exhibits no edema, tenderness or deformity.  No tenderness of bilateral lower extremities.  Neurological: She is alert and oriented to person, place, and time.  Skin: Skin is warm and dry.  No erythema in bilateral lower extremities.  Psychiatric: She has a normal mood and affect. Her behavior is normal.        Assessment & Plan:     1. Wheezing  Benign exam today, normal Pulse ox, no respiratory distress, no sign of DVT, mobile and compliant with Lovenox. Likely viral. Will give inhaler as below. Low threshold for CXR if she develops fever, chills, congestion, N/V.  - albuterol (PROVENTIL HFA;VENTOLIN HFA) 108 (90 Base) MCG/ACT inhaler; Inhale 2 puffs into the lungs every 6 (six) hours as needed for wheezing or shortness of breath.  Dispense: 1 Inhaler; Refill: 2  Return if symptoms worsen or fail to improve.  The entirety of the information documented in the History of Present Illness, Review of Systems and Physical Exam were personally obtained by me. Portions of this information were initially documented by [facility], CMA and reviewed by me for thoroughness and accuracy.           Trinna Post, PA-C  [facility]

## 2016-08-03 NOTE — Patient Instructions (Signed)
Bronchospasm, Adult Bronchospasm is a tightening of the airways going into the lungs. During an episode, it may be harder to breathe. You may cough, and you may make a whistling sound when you breathe (wheeze). This condition often affects people with asthma. What are the causes? This condition is caused by swelling and irritation in the airways. It can be triggered by:  An infection (common).  Seasonal allergies.  An allergic reaction.  Exercise.  Irritants. These include pollution, cigarette smoke, strong odors, aerosol sprays, and paint fumes.  Weather changes. Winds increase molds and pollens in the air. Cold air may cause swelling.  Stress and emotional upset.  What are the signs or symptoms? Symptoms of this condition include:  Wheezing. If the episode was triggered by an allergy, wheezing may start right away or hours later.  Nighttime coughing.  Frequent or severe coughing with a simple cold.  Chest tightness.  Shortness of breath.  Decreased ability to exercise.  How is this diagnosed? This condition is usually diagnosed with a review of your medical history and a physical exam. Tests, such as lung function tests, are sometimes done to look for other conditions. The need for a chest X-ray depends on where the wheezing occurs and whether it is the first time you have wheezed. How is this treated? This condition may be treated with:  Inhaled medicines. These open up the airways and help you breathe. They can be taken with an inhaler or a nebulizer device.  Corticosteroid medicines. These may be given for severe bronchospasm, usually when it is associated with asthma.  Avoiding triggers, such as irritants, infection, or allergies.  Follow these instructions at home: Medicines  Take over-the-counter and prescription medicines only as told by your health care provider.  If you need to use an inhaler or nebulizer to take your medicine, ask your health care  provider to explain how to use it correctly. If you were given a spacer, always use it with your inhaler. Lifestyle  Reduce the number of triggers in your home. To do this: ? Change your heating and air conditioning filter at least once a month. ? Limit your use of fireplaces and wood stoves. ? Do not smoke. Do not allow smoking in your home. ? Avoid using perfumes and fragrances. ? Get rid of pests, such as roaches and mice, and their droppings. ? Remove any mold from your home. ? Keep your house clean and dust free. Use unscented cleaning products. ? Replace carpet with wood, tile, or vinyl flooring. Carpet can trap dander and dust. ? Use allergy-proof pillows, mattress covers, and box spring covers. ? Wash bed sheets and blankets every week in hot water. Dry them in a dryer. ? Use blankets that are made of polyester or cotton. ? Wash your hands often. ? Do not allow pets in your bedroom.  Avoid breathing in cold air when you exercise. General instructions  Have a plan for seeking medical care. Know when to call your health care provider and local emergency services, and where to get emergency care.  Stay up to date on your immunizations.  When you have an episode of bronchospasm, stay calm. Try to relax and breathe more slowly.  If you have asthma, make sure you have an asthma action plan.  Keep all follow-up visits as told by your health care provider. This is important. Contact a health care provider if:  You have muscle aches.  You have chest pain.  The mucus that you   cough up (sputum) changes from clear or white to yellow, green, gray, or bloody.  You have a fever.  Your sputum gets thicker. Get help right away if:  Your wheezing and coughing get worse, even after you take your prescribed medicines.  It gets even harder to breathe.  You develop severe chest pain. Summary  Bronchospasm is a tightening of the airways going into the lungs.  During an episode of  bronchospasm, you may have a harder time breathing. You may cough and make a whistling sound when you breathe (wheeze).  Avoid exposure to triggers such as smoke, dust, mold, animal dander, and fragrances.  When you have an episode of bronchospasm, stay calm. Try to relax and breathe more slowly. This information is not intended to replace advice given to you by your health care provider. Make sure you discuss any questions you have with your health care provider. Document Released: 01/05/2003 Document Revised: 12/30/2015 Document Reviewed: 12/30/2015 Elsevier Interactive Patient Education  2017 [employer]  

## 2016-08-04 DIAGNOSIS — Z96652 Presence of left artificial knee joint: Secondary | ICD-10-CM | POA: Diagnosis not present

## 2016-08-05 LAB — AEROBIC/ANAEROBIC CULTURE W GRAM STAIN (SURGICAL/DEEP WOUND): Gram Stain: NONE SEEN

## 2016-08-05 LAB — AEROBIC/ANAEROBIC CULTURE (SURGICAL/DEEP WOUND): CULTURE: NO GROWTH

## 2016-08-08 DIAGNOSIS — Z96652 Presence of left artificial knee joint: Secondary | ICD-10-CM | POA: Diagnosis not present

## 2016-08-08 DIAGNOSIS — M25562 Pain in left knee: Secondary | ICD-10-CM | POA: Diagnosis not present

## 2016-08-10 DIAGNOSIS — M25562 Pain in left knee: Secondary | ICD-10-CM | POA: Diagnosis not present

## 2016-08-10 DIAGNOSIS — Z96652 Presence of left artificial knee joint: Secondary | ICD-10-CM | POA: Diagnosis not present

## 2016-08-15 ENCOUNTER — Telehealth: Payer: Self-pay | Admitting: Family Medicine

## 2016-08-15 DIAGNOSIS — M25562 Pain in left knee: Secondary | ICD-10-CM | POA: Diagnosis not present

## 2016-08-15 DIAGNOSIS — Z96652 Presence of left artificial knee joint: Secondary | ICD-10-CM | POA: Diagnosis not present

## 2016-08-15 DIAGNOSIS — M25662 Stiffness of left knee, not elsewhere classified: Secondary | ICD-10-CM | POA: Diagnosis not present

## 2016-08-15 NOTE — Telephone Encounter (Signed)
Left message regarding need to reschedule AWV and CPE.  Last appt was 07/2016 with Fabio Bering Pollak-ab

## 2016-08-17 DIAGNOSIS — M25562 Pain in left knee: Secondary | ICD-10-CM | POA: Diagnosis not present

## 2016-08-17 DIAGNOSIS — Z96652 Presence of left artificial knee joint: Secondary | ICD-10-CM | POA: Diagnosis not present

## 2016-08-21 DIAGNOSIS — Z96652 Presence of left artificial knee joint: Secondary | ICD-10-CM | POA: Diagnosis not present

## 2016-08-21 DIAGNOSIS — M25562 Pain in left knee: Secondary | ICD-10-CM | POA: Diagnosis not present

## 2016-08-21 DIAGNOSIS — H524 Presbyopia: Secondary | ICD-10-CM | POA: Diagnosis not present

## 2016-08-23 DIAGNOSIS — Z96652 Presence of left artificial knee joint: Secondary | ICD-10-CM | POA: Diagnosis not present

## 2016-08-23 DIAGNOSIS — M25562 Pain in left knee: Secondary | ICD-10-CM | POA: Diagnosis not present

## 2016-08-23 DIAGNOSIS — M25662 Stiffness of left knee, not elsewhere classified: Secondary | ICD-10-CM | POA: Diagnosis not present

## 2016-08-29 DIAGNOSIS — Z96652 Presence of left artificial knee joint: Secondary | ICD-10-CM | POA: Diagnosis not present

## 2016-08-31 DIAGNOSIS — M25662 Stiffness of left knee, not elsewhere classified: Secondary | ICD-10-CM | POA: Diagnosis not present

## 2016-08-31 DIAGNOSIS — Z96652 Presence of left artificial knee joint: Secondary | ICD-10-CM | POA: Diagnosis not present

## 2016-09-05 DIAGNOSIS — M25562 Pain in left knee: Secondary | ICD-10-CM | POA: Diagnosis not present

## 2016-09-05 DIAGNOSIS — Z96652 Presence of left artificial knee joint: Secondary | ICD-10-CM | POA: Diagnosis not present

## 2016-09-05 DIAGNOSIS — M25662 Stiffness of left knee, not elsewhere classified: Secondary | ICD-10-CM | POA: Diagnosis not present

## 2016-09-05 DIAGNOSIS — R29898 Other symptoms and signs involving the musculoskeletal system: Secondary | ICD-10-CM | POA: Diagnosis not present

## 2016-09-07 ENCOUNTER — Telehealth: Payer: Self-pay

## 2016-09-07 DIAGNOSIS — M25662 Stiffness of left knee, not elsewhere classified: Secondary | ICD-10-CM | POA: Diagnosis not present

## 2016-09-07 DIAGNOSIS — Z96652 Presence of left artificial knee joint: Secondary | ICD-10-CM | POA: Diagnosis not present

## 2016-09-07 NOTE — Telephone Encounter (Signed)
LMTCB and r/s AWV that was cancelled by the pt on 08/01/16.

## 2016-09-12 DIAGNOSIS — M25662 Stiffness of left knee, not elsewhere classified: Secondary | ICD-10-CM | POA: Diagnosis not present

## 2016-09-12 DIAGNOSIS — Z96652 Presence of left artificial knee joint: Secondary | ICD-10-CM | POA: Diagnosis not present

## 2016-09-15 ENCOUNTER — Encounter: Payer: Self-pay | Admitting: Family Medicine

## 2016-09-15 ENCOUNTER — Ambulatory Visit (INDEPENDENT_AMBULATORY_CARE_PROVIDER_SITE_OTHER): Payer: Medicare HMO | Admitting: Family Medicine

## 2016-09-15 VITALS — BP 130/74 | HR 74 | Temp 98.1°F | Ht 63.0 in | Wt 159.4 lb

## 2016-09-15 DIAGNOSIS — Z9889 Other specified postprocedural states: Secondary | ICD-10-CM

## 2016-09-15 DIAGNOSIS — M5440 Lumbago with sciatica, unspecified side: Secondary | ICD-10-CM

## 2016-09-15 DIAGNOSIS — M545 Low back pain, unspecified: Secondary | ICD-10-CM

## 2016-09-15 MED ORDER — METHOCARBAMOL 500 MG PO TABS: 500.0000 mg | ORAL_TABLET | Freq: Four times a day (QID) | ORAL | 0 refills | Status: DC

## 2016-09-15 NOTE — Progress Notes (Signed)
Patient: Molly Mitchell Female    DOB: May 04, 1945   71 y.o.   MRN: [id number] Visit Date: 09/15/2016  Today's Provider: [name], PA   Chief Complaint  Patient presents with  . Back Pain   Subjective:    Back Pain  This is a recurrent problem. Episode onset: 1 year ago. The problem has been gradually worsening since onset. The pain is present in the lumbar spine. The quality of the pain is described as burning. The pain radiates to the right thigh. The symptoms are aggravated by standing. Associated symptoms include leg pain. Risk factors: history of lumbar surgery in 2001. She has tried muscle relaxant (Meloxicam) for the symptoms. Improvement on treatment: helps initially but then stopped helping.   Patient Active Problem List   Diagnosis Date Noted  . S/P total knee arthroplasty 07/31/2016  . Bradycardia 12/20/2015  . Encounter for Medicare annual wellness exam 07/27/2015  . Acute bronchitis 05/13/2014  . Dysfunction of eustachian tube 05/13/2014  . Cardiac conduction disorder 05/13/2014  . Ache in joint 05/13/2014  . AB (asthmatic bronchitis) 05/13/2014  . Gonalgia 05/13/2014  . Asthma, cough variant 05/13/2014  . Screening for depression 05/13/2014  . Electrolyte imbalance 05/13/2014  . Fatigue 05/13/2014  . H/O total knee replacement 05/13/2014  . HLD (hyperlipidemia) 05/13/2014  . Elevated WBC count 05/13/2014  . Drug withdrawal syndrome to opium ([place]) 05/13/2014  . Arthritis of knee, degenerative 05/13/2014  . Osteopenia 05/13/2014  . Abdominal pain, right upper quadrant 05/13/2014  . Herpes zona 05/13/2014  . Periapical abscess without sinus tract 05/13/2014  . Pain in thoracic spine 05/13/2014  . MI (mitral incompetence) 12/29/2013  . Idiopathic insomnia 10/16/2013  . Polypharmacy 10/16/2013  . Dizziness 07/29/2013  . Long term current use of systemic steroids 07/21/2013  . History of prolonged Q-T interval on ECG 05/22/2013  . Benign essential HTN  05/22/2013  . Personal history of other diseases of the circulatory system 05/22/2013  . 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 Combined fat and carbohydrate induced hyperlipemia 03/31/2013  . S/P lumbar spine operation 06/20/2012  . Cervical post-laminectomy syndrome 06/20/2012  . Status post lumbar spine operation 06/20/2012  . Tumoral calcinosis 10/31/2011  . Connective tissue disease, undifferentiated ([place]) 10/31/2011  . Effusion of knee 10/31/2011  . Chondrocalcinosis due to dicalcium phosphate crystals 10/31/2011  . Undifferentiated connective tissue disease ([place]) 10/31/2011  . Chronic cervical pain 03/28/2011  . Arthralgia of ankle or foot 11/30/2010  . Neoplasm of breast 10/26/2008  . Acne 07/24/2008  . Dizziness and giddiness 07/24/2008  . Cervical pain 05/12/2008  . Dyssomnia 05/12/2008  . Essential (primary) hypertension 05/12/2008   Past Surgical History:  Procedure Laterality Date  . ABDOMINAL HYSTERECTOMY  1981  . CORRECTION HAMMER TOE    . JOINT REPLACEMENT Left 03/03/2013  . SPINAL FUSION  1991   lower back   . SPINAL FUSION  2003   neck  . TOTAL KNEE REVISION Left 07/31/2016   Procedure: TOTAL KNEE REVISION;  Surgeon: Dereck Leep, MD;  Location: [facility] ORS;  Service: Orthopedics;  Laterality: Left;   Family History  Problem Relation Age of Onset  . Hypertension Brother   . Hypertension Mother   . Hypertension Sister   . Cancer Sister   . Diabetes Paternal Grandmother   . Heart failure Maternal Grandmother    Allergies  Allergen Reactions  . Amitriptyline Hcl   . Duricef  [Cefadroxil]   . Erythromycin   .  Levofloxacin     Severe Shoulder Pain  . Tetanus Toxoid      Previous Medications   ALBUTEROL (PROVENTIL HFA;VENTOLIN HFA) 108 (90 BASE) MCG/ACT INHALER    Inhale 2 puffs into the lungs every 6 (six) hours as needed for wheezing or shortness of breath.   ASCORBIC ACID (VITAMIN C) 1000 MG TABLET    Take 1,000 mg by mouth  daily.    ATENOLOL (TENORMIN) 50 MG TABLET    Take 1 tablet by mouth daily.    CALCIUM-MAGNESIUM-ZINC PO    Take 2 tablets by mouth daily.    CHOLECALCIFEROL (VITAMIN D3) 1000 UNITS CAPS    Take 1 capsule by mouth daily.    COENZYME Q10 (EQL COQ10) 300 MG CAPS    Take 300 mg by mouth daily.   MELOXICAM (MOBIC) 15 MG TABLET    Take 1 tablet (15 mg total) by mouth daily.   MILK THISTLE 300 MG CAPS    Take 600 mg by mouth daily.   MULTIPLE VITAMINS-MINERALS (MULTIVITAMIN WITH MINERALS) TABLET    Take 3 tablets by mouth daily.   NYSTATIN OINTMENT (MYCOSTATIN)    Apply 1 application topically daily as needed (skin irritation).    OMEGA-3 FATTY ACIDS PO    Take 2 capsules by mouth daily.    PRAVASTATIN (PRAVACHOL) 40 MG TABLET    Take 40 mg by mouth at bedtime.   TRIAMTERENE-HYDROCHLOROTHIAZIDE (MAXZIDE-25) 37.5-25 MG TABLET    TAKE 1 TABLET EVERY DAY   VITAMIN E 400 UNIT CAPSULE    1 capsule daily.    Review of Systems  Constitutional: Negative.   Respiratory: Negative.   Cardiovascular: Negative.   Musculoskeletal: Positive for back pain.    Social History  Substance Use Topics  . Smoking status: Former Smoker    Quit date: 04/17/1979  . Smokeless tobacco: Never Used  . Alcohol use 0.0 oz/week     Comment: 1-2 drinks a month    Objective:   BP 130/74 (BP Location: Right Arm, Patient Position: Sitting, Cuff Size: Normal)   Pulse 74   Temp 98.1 F (36.7 C) (Oral)   Ht 5\' 3"  (1.6 m)   Wt 159 lb 6.4 oz (72.3 kg)   SpO2 97%   BMI 28.24 kg/m   Physical Exam  Constitutional: She is oriented to person, place, and time. She appears well-developed and well-nourished. No distress.  HENT:  Head: Normocephalic and atraumatic.  Right Ear: Hearing normal.  Left Ear: Hearing normal.  Nose: Nose normal.  Eyes: Conjunctivae and lids are normal. Right eye exhibits no discharge. Left eye exhibits no discharge. No scleral icterus.  Pulmonary/Chest: Effort normal. No respiratory distress.    Musculoskeletal: Normal range of motion.  Right lower back pain with pains into the right anterior thigh. DTR's symmetric and reactive. Good pulses of feet. Good strength with SLR's 90 degrees without pain.  Neurological: She is alert and oriented to person, place, and time. She has normal reflexes.  Skin: Skin is intact. No lesion and no rash noted.  Psychiatric: She has a normal mood and affect. Her speech is normal and behavior is normal. Thought content normal.      Assessment & Plan:     1. Low back pain with radiation Progressive low back pain with radiation into the right anterior thigh for the past several months with history of past surgery and chronic low back pain. Not much help from the Meloxicam 15 mg qd. Recommend moist heat applications and  Salonpas Lidocaine patch with Methocarbamol for musculoskeletal pain. May need referral to neurosurgeon if any changes in cage used to fuse L4-5 and L5-S1 in 2006. Schedule CT scan without contrast due to past kidney function stress.  - CT Lumbar Spine Wo Contrast; Future - methocarbamol (ROBAXIN) 500 MG tablet; Take 1 tablet (500 mg total) by mouth 4 (four) times daily.  Dispense: 30 tablet; Refill: 0  2. Status post lumbar spine operation Concerned about fusion cage loosening as the left knee joint replacement did requiring revision 07-31-16 by Dr. Marry Guan. Will get CT scan without contrast scheduled. - CT Lumbar Spine Wo Contrast; Future

## 2016-09-22 ENCOUNTER — Ambulatory Visit: Payer: Medicare HMO

## 2016-09-22 ENCOUNTER — Ambulatory Visit (INDEPENDENT_AMBULATORY_CARE_PROVIDER_SITE_OTHER): Payer: Medicare HMO

## 2016-09-22 VITALS — BP 144/80 | HR 64 | Temp 98.8°F | Ht 63.0 in | Wt 158.2 lb

## 2016-09-22 DIAGNOSIS — Z0000 Encounter for general adult medical examination without abnormal findings: Secondary | ICD-10-CM | POA: Diagnosis not present

## 2016-09-22 NOTE — Patient Instructions (Addendum)
Molly Mitchell , Thank you for taking time to come for your Medicare Wellness Visit. I appreciate your ongoing commitment to your health goals. Please review the following plan we discussed and let me know if I can assist you in the future.   Screening recommendations/referrals: Colonoscopy: up to date Mammogram: up to date Bone Density: up to date Recommended yearly ophthalmology/optometry visit for glaucoma screening and checkup Recommended yearly dental visit for hygiene and checkup  Vaccinations: Influenza vaccine: declined  Pneumococcal vaccine: declined Tdap vaccine: declined (allergic) Shingles vaccine: declined  Advanced directives: Please bring a copy of your POA (Power of Attorney) and/or Living Will to your next appointment.   Conditions/risks identified: Recommend cutting down carbohydrates by 45-50 grams a day to help aid in weight loss of 30 lbs.   Next appointment: 09/26/16 @ 10:00 AM   Preventive Care 70 Years and Older, Female Preventive care refers to lifestyle choices and visits with your health care provider that can promote health and wellness. What does preventive care include?  A yearly physical exam. This is also called an annual well check.  Dental exams once or twice a year.  Routine eye exams. Ask your health care provider how often you should have your eyes checked.  Personal lifestyle choices, including:  Daily care of your teeth and gums.  Regular physical activity.  Eating a healthy diet.  Avoiding tobacco and drug use.  Limiting alcohol use.  Practicing safe sex.  Taking low-dose aspirin every day.  Taking vitamin and mineral supplements as recommended by your health care provider. What happens during an annual well check? The services and screenings done by your health care provider during your annual well check will depend on your age, overall health, lifestyle risk factors, and family history of disease. Counseling  Your health care  provider may ask you questions about your:  Alcohol use.  Tobacco use.  Drug use.  Emotional well-being.  Home and relationship well-being.  Sexual activity.  Eating habits.  History of falls.  Memory and ability to understand (cognition).  Work and work Statistician.  Reproductive health. Screening  You may have the following tests or measurements:  Height, weight, and BMI.  Blood pressure.  Lipid and cholesterol levels. These may be checked every 5 years, or more frequently if you are over 70 years old.  Skin check.  Lung cancer screening. You may have this screening every year starting at age 70 if you have a 30-pack-year history of smoking and currently smoke or have quit within the past 15 years.  Fecal occult blood test (FOBT) of the stool. You may have this test every year starting at age 70.  Flexible sigmoidoscopy or colonoscopy. You may have a sigmoidoscopy every 5 years or a colonoscopy every 10 years starting at age 70.  Hepatitis C blood test.  Hepatitis B blood test.  Sexually transmitted disease (STD) testing.  Diabetes screening. This is done by checking your blood sugar (glucose) after you have not eaten for a while (fasting). You may have this done every 1-3 years.  Bone density scan. This is done to screen for osteoporosis. You may have this done starting at age 70.  Mammogram. This may be done every 1-2 years. Talk to your health care provider about how often you should have regular mammograms. Talk with your health care provider about your test results, treatment options, and if necessary, the need for more tests. Vaccines  Your health care provider may recommend certain vaccines, such  as:  Influenza vaccine. This is recommended every year.  Tetanus, diphtheria, and acellular pertussis (Tdap, Td) vaccine. You may need a Td booster every 10 years.  Zoster vaccine. You may need this after age 70.  Pneumococcal 13-valent conjugate (PCV13)  vaccine. One dose is recommended after age 42.  Pneumococcal polysaccharide (PPSV23) vaccine. One dose is recommended after age 37. Talk to your health care provider about which screenings and vaccines you need and how often you need them. This information is not intended to replace advice given to you by your health care provider. Make sure you discuss any questions you have with your health care provider. Document Released: 01/29/2015 Document Revised: 09/22/2015 Document Reviewed: 11/03/2014 Elsevier Interactive Patient Education  2017 [place] Prevention in the Home Falls can cause injuries. They can happen to people of all ages. There are many things you can do to make your home safe and to help prevent falls. What can I do on the outside of my home?  Regularly fix the edges of walkways and driveways and fix any cracks.  Remove anything that might make you trip as you walk through a door, such as a raised step or threshold.  Trim any bushes or trees on the path to your home.  Use bright outdoor lighting.  Clear any walking paths of anything that might make someone trip, such as rocks or tools.  Regularly check to see if handrails are loose or broken. Make sure that both sides of any steps have handrails.  Any raised decks and porches should have guardrails on the edges.  Have any leaves, snow, or ice cleared regularly.  Use sand or salt on walking paths during winter.  Clean up any spills in your garage right away. This includes oil or grease spills. What can I do in the bathroom?  Use night lights.  Install grab bars by the toilet and in the tub and shower. Do not use towel bars as grab bars.  Use non-skid mats or decals in the tub or shower.  If you need to sit down in the shower, use a plastic, non-slip stool.  Keep the floor dry. Clean up any water that spills on the floor as soon as it happens.  Remove soap buildup in the tub or shower  regularly.  Attach bath mats securely with double-sided non-slip rug tape.  Do not have throw rugs and other things on the floor that can make you trip. What can I do in the bedroom?  Use night lights.  Make sure that you have a light by your bed that is easy to reach.  Do not use any sheets or blankets that are too big for your bed. They should not hang down onto the floor.  Have a firm chair that has side arms. You can use this for support while you get dressed.  Do not have throw rugs and other things on the floor that can make you trip. What can I do in the kitchen?  Clean up any spills right away.  Avoid walking on wet floors.  Keep items that you use a lot in easy-to-reach places.  If you need to reach something above you, use a strong step stool that has a grab bar.  Keep electrical cords out of the way.  Do not use floor polish or wax that makes floors slippery. If you must use wax, use non-skid floor wax.  Do not have throw rugs and other things on the  floor that can make you trip. What can I do with my stairs?  Do not leave any items on the stairs.  Make sure that there are handrails on both sides of the stairs and use them. Fix handrails that are broken or loose. Make sure that handrails are as long as the stairways.  Check any carpeting to make sure that it is firmly attached to the stairs. Fix any carpet that is loose or worn.  Avoid having throw rugs at the top or bottom of the stairs. If you do have throw rugs, attach them to the floor with carpet tape.  Make sure that you have a light switch at the top of the stairs and the bottom of the stairs. If you do not have them, ask someone to add them for you. What else can I do to help prevent falls?  Wear shoes that:  Do not have high heels.  Have rubber bottoms.  Are comfortable and fit you well.  Are closed at the toe. Do not wear sandals.  If you use a stepladder:  Make sure that it is fully  opened. Do not climb a closed stepladder.  Make sure that both sides of the stepladder are locked into place.  Ask someone to hold it for you, if possible.  Clearly mark and make sure that you can see:  Any grab bars or handrails.  First and last steps.  Where the edge of each step is.  Use tools that help you move around (mobility aids) if they are needed. These include:  Canes.  Walkers.  Scooters.  Crutches.  Turn on the lights when you go into a dark area. Replace any light bulbs as soon as they burn out.  Set up your furniture so you have a clear path. Avoid moving your furniture around.  If any of your floors are uneven, fix them.  If there are any pets around you, be aware of where they are.  Review your medicines with your doctor. Some medicines can make you feel dizzy. This can increase your chance of falling. Ask your doctor what other things that you can do to help prevent falls. This information is not intended to replace advice given to you by your health care provider. Make sure you discuss any questions you have with your health care provider. Document Released: 10/29/2008 Document Revised: 06/10/2015 Document Reviewed: 02/06/2014 Elsevier Interactive Patient Education  2017 [employer].

## 2016-09-22 NOTE — Progress Notes (Signed)
Subjective:   Molly Mitchell is a 71 y.o. female who presents for Medicare Annual (Subsequent) preventive examination.  Review of Systems:  N/A  Cardiac Risk Factors include: advanced age (>47men, >50 women);dyslipidemia;hypertension     Objective:     Vitals: BP (!) 144/80 (BP Location: Left Arm)   Pulse 64   Temp 98.8 F (37.1 C) (Oral)   Ht 5\' 3"  (1.6 m)   Wt 158 lb 3.2 oz (71.8 kg)   BMI 28.02 kg/m   Body mass index is 28.02 kg/m.    Tobacco History  Smoking Status  . Former Smoker  . Quit date: 04/17/1979  Smokeless Tobacco  . Never Used     Counseling given: Not Answered   Past Medical History:  Diagnosis Date  . Arthritis   . HLD (hyperlipidemia)   . Hypertension    Past Surgical History:  Procedure Laterality Date  . ABDOMINAL HYSTERECTOMY  1981  . CORRECTION HAMMER TOE    . JOINT REPLACEMENT Left 03/03/2013  . SPINAL FUSION  1991   lower back   . SPINAL FUSION  2003   neck  . TOTAL KNEE REVISION Left 07/31/2016   Procedure: TOTAL KNEE REVISION;  Surgeon: Dereck Leep, MD;  Location: [facility] ORS;  Service: Orthopedics;  Laterality: Left;   Family History  Problem Relation Age of Onset  . Hypertension Brother   . Hypertension Mother   . Hypertension Sister   . Cancer Sister   . Diabetes Paternal Grandmother   . Heart failure Maternal Grandmother    History  Sexual Activity  . Sexual activity: Not on file    Outpatient Encounter Prescriptions as of 09/22/2016  Medication Sig  . albuterol (PROVENTIL HFA;VENTOLIN HFA) 108 (90 Base) MCG/ACT inhaler Inhale 2 puffs into the lungs every 6 (six) hours as needed for wheezing or shortness of breath.  . Ascorbic Acid (VITAMIN C) 1000 MG tablet Take 1,000 mg by mouth daily.   Marland Kitchen atenolol (TENORMIN) 50 MG tablet Take 1 tablet by mouth daily.   Marland Kitchen CALCIUM-MAGNESIUM-ZINC PO Take 2 tablets by mouth daily.   . Cholecalciferol (VITAMIN D3) 1000 UNITS CAPS Take 1 capsule by mouth daily.   . Coenzyme Q10 (EQL  COQ10) 300 MG CAPS Take 300 mg by mouth daily.  . meloxicam (MOBIC) 15 MG tablet Take 1 tablet (15 mg total) by mouth daily.  . methocarbamol (ROBAXIN) 500 MG tablet Take 1 tablet (500 mg total) by mouth 4 (four) times daily.  . Milk Thistle 300 MG CAPS Take 600 mg by mouth daily.  . Multiple Vitamins-Minerals (MULTIVITAMIN WITH MINERALS) tablet Take 3 tablets by mouth daily.  Marland Kitchen nystatin ointment (MYCOSTATIN) Apply 1 application topically daily as needed (skin irritation).   . OMEGA-3 FATTY ACIDS PO Take 2 capsules by mouth daily.   . pravastatin (PRAVACHOL) 40 MG tablet Take 40 mg by mouth at bedtime.  . triamterene-hydrochlorothiazide (MAXZIDE-25) 37.5-25 MG tablet TAKE 1 TABLET EVERY DAY  . vitamin E 400 UNIT capsule 1 capsule daily.   No facility-administered encounter medications on file as of 09/22/2016.     Activities of Daily Living In your present state of health, do you have any difficulty performing the following activities: 09/22/2016 07/31/2016  Hearing? N -  Vision? N -  Difficulty concentrating or making decisions? N -  Walking or climbing stairs? N -  Dressing or bathing? N -  Doing errands, shopping? N N  Preparing Food and eating ? N -  Using  the Toilet? N -  In the past six months, have you accidently leaked urine? N -  Do you have problems with loss of bowel control? N -  Managing your Medications? N -  Managing your Finances? N -  Housekeeping or managing your Housekeeping? N -  Some recent data might be hidden    Patient Care Team: Chrismon, Vickki Muff, PA as PCP - General (Physician Assistant) Bryson Ha, OD as Consulting Physician (Optometry) Marry Guan, Laurice Record, MD as Consulting Physician (Orthopedic Surgery) Corey Skains, MD as Consulting Physician (Cardiology)    Assessment:     Exercise Activities and Dietary recommendations Current Exercise Habits: Structured exercise class;Home exercise routine, Type of exercise: Other - see comments;strength  training/weights;stretching;treadmill (line dance x 5 days a week for 1.5 hour), Time (Minutes): > 60, Frequency (Times/Week): 5, Weekly Exercise (Minutes/Week): 0, Intensity: Moderate, Exercise limited by: None identified  Goals    . Weight (lb) < 130 lb (59 kg)          Recommend cutting down carbohydrates by 45-50 grams a day to help aid in weight loss of 30 lbs.       Fall Risk Fall Risk  09/22/2016 08/03/2016 07/27/2015 07/17/2014  Falls in the past year? No No No No   Depression Screen PHQ 2/9 Scores 09/22/2016 08/03/2016 07/27/2015 07/27/2015  PHQ - 2 Score 0 0 0 0  PHQ- 9 Score 2 3 - -  Exception Documentation - - - -  Not completed - - - -     Cognitive Function     6CIT Screen 09/22/2016  What Year? 0 points  What month? 0 points  What time? 0 points  Count back from 20 0 points  Months in reverse 0 points  Repeat phrase 0 points  Total Score 0    Immunization History  Administered Date(s) Administered  . Influenza Split 11/23/2011  . Influenza-Unspecified 11/23/2011, 10/16/2012   Screening Tests Health Maintenance  Topic Date Due  . Hepatitis C Screening  [birth year]  . INFLUENZA VACCINE  04/15/2017 (Originally 08/16/2016)  . PNA vac Low Risk Adult (1 of 2 - PCV13) 09/15/2017 (Originally 02/24/2011)  . MAMMOGRAM  04/27/2017  . Fecal DNA (Cologuard)  08/16/2018  . DEXA SCAN  Completed  . TETANUS/TDAP  Excluded      Plan:  I have personally reviewed and addressed the Medicare Annual Wellness questionnaire and have noted the following in the patient's chart:  A. Medical and social history B. Use of alcohol, tobacco or illicit drugs  C. Current medications and supplements D. Functional ability and status E.  Nutritional status F.  Physical activity G. Advance directives H. List of other physicians I.  Hospitalizations, surgeries, and ER visits in previous 12 months J.  [place] such as hearing and vision if needed, cognitive and depression L. Referrals  and appointments - none  In addition, I have reviewed and discussed with patient certain preventive protocols, quality metrics, and best practice recommendations. A written personalized care plan for preventive services as well as general preventive health recommendations were provided to patient.  See attached scanned questionnaire for additional information.   Signed,  Fabio Neighbors, LPN Nurse Health Advisor   MD Recommendations: Pt declined Pneumonia and influenza vaccine today. Pt would like Hepatitis C blood work completed at next [place] on 09/26/16 with the rest of her blood work.  Reviewed Nurse Health Advisor's documentation and recommendations. Was available for consultation and agree with plan.

## 2016-09-26 ENCOUNTER — Ambulatory Visit (INDEPENDENT_AMBULATORY_CARE_PROVIDER_SITE_OTHER): Payer: Medicare HMO | Admitting: Family Medicine

## 2016-09-26 ENCOUNTER — Encounter: Payer: Self-pay | Admitting: Family Medicine

## 2016-09-26 VITALS — BP 128/80 | HR 60 | Temp 98.0°F | Ht 63.0 in | Wt 156.2 lb

## 2016-09-26 DIAGNOSIS — Z9889 Other specified postprocedural states: Secondary | ICD-10-CM | POA: Diagnosis not present

## 2016-09-26 DIAGNOSIS — Z96652 Presence of left artificial knee joint: Secondary | ICD-10-CM | POA: Diagnosis not present

## 2016-09-26 DIAGNOSIS — Z1159 Encounter for screening for other viral diseases: Secondary | ICD-10-CM | POA: Diagnosis not present

## 2016-09-26 DIAGNOSIS — Z0000 Encounter for general adult medical examination without abnormal findings: Secondary | ICD-10-CM

## 2016-09-26 DIAGNOSIS — E782 Mixed hyperlipidemia: Secondary | ICD-10-CM | POA: Diagnosis not present

## 2016-09-26 DIAGNOSIS — I10 Essential (primary) hypertension: Secondary | ICD-10-CM | POA: Diagnosis not present

## 2016-09-26 NOTE — Progress Notes (Signed)
Patient: Molly Mitchell, Female    DOB: [birth year], 71 y.o.   MRN: [id number] Visit Date: 09/26/2016  Today's Provider: [name], PA   Chief Complaint  Patient presents with  . Annual Exam   Subjective:    Annual physical exam Molly Mitchell is a 71 y.o. female who presents today for health maintenance and complete physical. She feels well. She reports exercising daily. She reports she is sleeping poorly. Patient declined Prevnar and Influenza vaccines today.  -----------------------------------------------------------------   Review of Systems  Constitutional: Negative.   HENT: Negative.   Eyes: Negative.   Respiratory: Negative.   Cardiovascular: Negative.   Gastrointestinal: Negative.   Endocrine: Negative.   Genitourinary: Negative.   Musculoskeletal: Positive for arthralgias.  Skin: Negative.   Allergic/Immunologic: Negative.   Neurological: Negative.   Hematological: Negative.   Psychiatric/Behavioral: Negative.     Social History      She  reports that she quit smoking about 37 years ago. She has never used smokeless tobacco. She reports that she does not drink alcohol or use drugs.       Social History   Social History  . Marital status: Married    Spouse name: N/A  . Number of children: N/A  . Years of education: N/A   Social History Main Topics  . Smoking status: Former Smoker    Quit date: 04/17/1979  . Smokeless tobacco: Never Used  . Alcohol use No  . Drug use: No  . Sexual activity: Not Asked   Other Topics Concern  . None   Social History Narrative  . None    Past Medical History:  Diagnosis Date  . Arthritis   . HLD (hyperlipidemia)   . Hypertension    Patient Active Problem List   Diagnosis Date Noted  . S/P total knee arthroplasty 07/31/2016  . Bradycardia 12/20/2015  . Encounter for Medicare annual wellness exam 07/27/2015  . Acute bronchitis 05/13/2014  . Dysfunction of eustachian tube 05/13/2014  . Cardiac  conduction disorder 05/13/2014  . Ache in joint 05/13/2014  . AB (asthmatic bronchitis) 05/13/2014  . Gonalgia 05/13/2014  . Asthma, cough variant 05/13/2014  . Screening for depression 05/13/2014  . Electrolyte imbalance 05/13/2014  . Fatigue 05/13/2014  . H/O total knee replacement 05/13/2014  . HLD (hyperlipidemia) 05/13/2014  . Elevated WBC count 05/13/2014  . Drug withdrawal syndrome to opium ([place]) 05/13/2014  . Arthritis of knee, degenerative 05/13/2014  . Osteopenia 05/13/2014  . Abdominal pain, right upper quadrant 05/13/2014  . Herpes zona 05/13/2014  . Periapical abscess without sinus tract 05/13/2014  . Pain in thoracic spine 05/13/2014  . MI (mitral incompetence) 12/29/2013  . Idiopathic insomnia 10/16/2013  . Polypharmacy 10/16/2013  . Dizziness 07/29/2013  . Long term current use of systemic steroids 07/21/2013  . History of prolonged Q-T interval on ECG 05/22/2013  . Benign essential HTN 05/22/2013  . Personal history of other diseases of the circulatory system 05/22/2013  . 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 Combined fat and carbohydrate induced hyperlipemia 03/31/2013  . S/P lumbar spine operation 06/20/2012  . Cervical post-laminectomy syndrome 06/20/2012  . Status post lumbar spine operation 06/20/2012  . Tumoral calcinosis 10/31/2011  . Connective tissue disease, undifferentiated ([place]) 10/31/2011  . Effusion of knee 10/31/2011  . Chondrocalcinosis due to dicalcium phosphate crystals 10/31/2011  . Undifferentiated connective tissue disease ([place]) 10/31/2011  . Chronic cervical pain 03/28/2011  . Arthralgia of ankle or  foot 11/30/2010  . Neoplasm of breast 10/26/2008  . Acne 07/24/2008  . Dizziness and giddiness 07/24/2008  . Cervical pain 05/12/2008  . Dyssomnia 05/12/2008  . Essential (primary) hypertension 05/12/2008    Past Surgical History:  Procedure Laterality Date  . ABDOMINAL HYSTERECTOMY  1981  . CORRECTION  HAMMER TOE    . JOINT REPLACEMENT Left 03/03/2013  . SPINAL FUSION  1991   lower back   . SPINAL FUSION  2003   neck  . TOTAL KNEE REVISION Left 07/31/2016   Procedure: TOTAL KNEE REVISION;  Surgeon: Dereck Leep, MD;  Location: [facility] ORS;  Service: Orthopedics;  Laterality: Left;    Family History        Family Status  Relation Status  . Brother Alive  . Mother Deceased  . Sister Alive  . Sister Alive  . Father Deceased  . PGM (Not Specified)  . MGM (Not Specified)        Her family history includes Cancer in her sister; Diabetes in her paternal grandmother; Heart failure in her maternal grandmother; Hypertension in her brother, mother, and sister.     Allergies  Allergen Reactions  . Amitriptyline Hcl   . Duricef  [Cefadroxil]   . Erythromycin   . Levofloxacin     Severe Shoulder Pain  . Tetanus Toxoid      Current Outpatient Prescriptions:  .  albuterol (PROVENTIL HFA;VENTOLIN HFA) 108 (90 Base) MCG/ACT inhaler, Inhale 2 puffs into the lungs every 6 (six) hours as needed for wheezing or shortness of breath., Disp: 1 Inhaler, Rfl: 2 .  Ascorbic Acid (VITAMIN C) 1000 MG tablet, Take 1,000 mg by mouth daily. , Disp: , Rfl:  .  atenolol (TENORMIN) 50 MG tablet, Take 1 tablet by mouth daily. , Disp: , Rfl:  .  CALCIUM-MAGNESIUM-ZINC PO, Take 2 tablets by mouth daily. , Disp: , Rfl:  .  Cholecalciferol (VITAMIN D3) 1000 UNITS CAPS, Take 1 capsule by mouth daily. , Disp: , Rfl:  .  Coenzyme Q10 (EQL COQ10) 300 MG CAPS, Take 300 mg by mouth daily., Disp: , Rfl:  .  meloxicam (MOBIC) 15 MG tablet, Take 1 tablet (15 mg total) by mouth daily., Disp: 90 tablet, Rfl: 3 .  methocarbamol (ROBAXIN) 500 MG tablet, Take 1 tablet (500 mg total) by mouth 4 (four) times daily., Disp: 30 tablet, Rfl: 0 .  Milk Thistle 300 MG CAPS, Take 600 mg by mouth daily., Disp: , Rfl:  .  Multiple Vitamins-Minerals (MULTIVITAMIN WITH MINERALS) tablet, Take 3 tablets by mouth daily., Disp: , Rfl:  .   nystatin ointment (MYCOSTATIN), Apply 1 application topically daily as needed (skin irritation). , Disp: , Rfl:  .  OMEGA-3 FATTY ACIDS PO, Take 2 capsules by mouth daily. , Disp: , Rfl:  .  pravastatin (PRAVACHOL) 40 MG tablet, Take 40 mg by mouth at bedtime., Disp: , Rfl:  .  triamterene-hydrochlorothiazide (MAXZIDE-25) 37.5-25 MG tablet, TAKE 1 TABLET EVERY DAY, Disp: 90 tablet, Rfl: 3 .  vitamin E 400 UNIT capsule, 1 capsule daily., Disp: , Rfl:    Patient Care Team: Anhad Sheeley, Vickki Muff, PA as PCP - General (Physician Assistant) Bryson Ha, OD as Consulting Physician (Optometry) Marry Guan, Laurice Record, MD as Consulting Physician (Orthopedic Surgery) Corey Skains, MD as Consulting Physician (Cardiology)      Objective:   Vitals: BP 128/80 (BP Location: Right Arm, Patient Position: Sitting, Cuff Size: Normal)   Pulse 60   Temp 98 F (36.7  C) (Oral)   Ht 5\' 3"  (1.6 m)   Wt 156 lb 3.2 oz (70.9 kg)   SpO2 99%   BMI 27.67 kg/m    Vitals:   09/26/16 0958  BP: 128/80  Pulse: 60  Temp: 98 F (36.7 C)  TempSrc: Oral  SpO2: 99%  Weight: 156 lb 3.2 oz (70.9 kg)  Height: 5\' 3"  (1.6 m)     Physical Exam  Constitutional: She is oriented to person, place, and time. She appears well-developed and well-nourished.  HENT:  Head: Normocephalic and atraumatic.  Right Ear: External ear normal.  Left Ear: External ear normal.  Nose: Nose normal.  Mouth/Throat: Oropharynx is clear and moist.  Eyes: Pupils are equal, round, and reactive to light. Conjunctivae and EOM are normal. Right eye exhibits no discharge.  Neck: Normal range of motion. Neck supple. No tracheal deviation present. No thyromegaly present.  Cardiovascular: Normal rate, regular rhythm, normal heart sounds and intact distal pulses.   No murmur heard. Pulmonary/Chest: Effort normal and breath sounds normal. No respiratory distress. She has no wheezes. She has no rales. She exhibits no tenderness.  Abdominal: Soft. She  exhibits no distension and no mass. There is no tenderness. There is no rebound and no guarding.  Genitourinary:  Genitourinary Comments: Hysterectomy with only one ovary remaining in 1981 due to tubal pregnancy.  Musculoskeletal: She exhibits no edema or tenderness.  Well healed scars from left knee replacement and cervical fusion. Decreased ROM in neck and left knee.   Lymphadenopathy:    She has no cervical adenopathy.  Neurological: She is alert and oriented to person, place, and time. She has normal reflexes. No cranial nerve deficit. She exhibits normal muscle tone. Coordination normal.  Skin: Skin is warm and dry. No rash noted. No erythema.  Psychiatric: She has a normal mood and affect. Her behavior is normal. Judgment and thought content normal.    Depression Screen PHQ 2/9 Scores 09/22/2016 08/03/2016 07/27/2015 07/27/2015  PHQ - 2 Score 0 0 0 0  PHQ- 9 Score 2 3 - -  Exception Documentation - - - -  Not completed - - - -    Assessment & Plan:     Routine Health Maintenance and Physical Exam  Exercise Activities and Dietary recommendations Goals    . Weight (lb) < 130 lb (59 kg)          Recommend cutting down carbohydrates by 45-50 grams a day to help aid in weight loss of 30 lbs.        Immunization History  Administered Date(s) Administered  . Influenza Split 11/23/2011  . Influenza-Unspecified 11/23/2011, 10/16/2012    Health Maintenance  Topic Date Due  . Hepatitis C Screening  1945-10-08  . INFLUENZA VACCINE  04/15/2017 (Originally 08/16/2016)  . PNA vac Low Risk Adult (1 of 2 - PCV13) 09/15/2017 (Originally 02/24/2011)  . MAMMOGRAM  04/27/2017  . Fecal DNA (Cologuard)  08/16/2018  . DEXA SCAN  Completed  . TETANUS/TDAP  Excluded     Discussed health benefits of physical activity, and encouraged her to engage in regular exercise appropriate for her age and condition.    -------------------------------------------------------------------- 1. Annual  physical exam Good general physical exam. Had a hysterectomy in 1981 due to an ectopic pregnancy with only one ovary remaining now. Mammogram not due until 2019 and Cologuard due for recheck in 2020. Recheck routine labs. - CBC with Differential - Lipid panel - TSH - Comprehensive metabolic panel  2. Essential (primary)  hypertension Stable and well controlled on the Maxzide and Atenolol. Will recheck labs and follow up pending reports. - CBC with Differential - Lipid panel - TSH - Comprehensive metabolic panel  3. History of total left knee replacement Dr Marry Guan (orthopedist) did left knee total revision on 07-31-16 (original replacement 03-03-13). Patient walking well with healing of all incisions.   4. Mixed hyperlipidemia Tolerating the Pravastatin 40 mg qd and Fish Oil qd. Continue low fat diet and recheck pending reports. - Lipid panel - TSH - Comprehensive metabolic panel  5. S/P lumbar spine operation History of lumbar fusion and cervical laminectomy/decompress with chronic pain syndrome.  6. Need for hepatitis C screening test - Hepatitis C Antibody    [name], PA  [facility]

## 2016-09-27 ENCOUNTER — Telehealth: Payer: Self-pay

## 2016-09-27 ENCOUNTER — Ambulatory Visit
Admission: RE | Admit: 2016-09-27 | Discharge: 2016-09-27 | Disposition: A | Discharge status: Home / Self Care | Payer: Medicare HMO | Source: Ambulatory Visit | Attending: Family Medicine | Admitting: Family Medicine

## 2016-09-27 DIAGNOSIS — M545 Low back pain, unspecified: Secondary | ICD-10-CM

## 2016-09-27 DIAGNOSIS — Z9889 Other specified postprocedural states: Secondary | ICD-10-CM

## 2016-09-27 DIAGNOSIS — M5136 Other intervertebral disc degeneration, lumbar region: Secondary | ICD-10-CM | POA: Diagnosis not present

## 2016-09-27 DIAGNOSIS — M48061 Spinal stenosis, lumbar region without neurogenic claudication: Secondary | ICD-10-CM | POA: Diagnosis not present

## 2016-09-27 DIAGNOSIS — M5126 Other intervertebral disc displacement, lumbar region: Secondary | ICD-10-CM | POA: Insufficient documentation

## 2016-09-27 DIAGNOSIS — Z981 Arthrodesis status: Secondary | ICD-10-CM | POA: Diagnosis not present

## 2016-09-27 DIAGNOSIS — M5440 Lumbago with sciatica, unspecified side: Secondary | ICD-10-CM

## 2016-09-27 LAB — CBC WITH DIFFERENTIAL/PLATELET
Basophils Absolute: 42 cells/uL (ref 0–200)
Basophils Relative: 0.5 %
EOS PCT: 1.6 %
Eosinophils Absolute: 134 cells/uL (ref 15–500)
HCT: 42.7 % (ref 35.0–45.0)
Hemoglobin: 14.4 g/dL (ref 11.7–15.5)
LYMPHS ABS: 3083 {cells}/uL (ref 850–3900)
MCH: 30.6 pg (ref 27.0–33.0)
MCHC: 33.7 g/dL (ref 32.0–36.0)
MCV: 90.7 fL (ref 80.0–100.0)
MPV: 10.4 fL (ref 7.5–12.5)
Monocytes Relative: 8.2 %
NEUTROS PCT: 53 %
Neutro Abs: 4452 cells/uL (ref 1500–7800)
PLATELETS: 281 10*3/uL (ref 140–400)
RBC: 4.71 10*6/uL (ref 3.80–5.10)
RDW: 13.6 % (ref 11.0–15.0)
TOTAL LYMPHOCYTE: 36.7 %
WBC mixed population: 689 cells/uL (ref 200–950)
WBC: 8.4 10*3/uL (ref 3.8–10.8)

## 2016-09-27 LAB — COMPREHENSIVE METABOLIC PANEL
AG RATIO: 1.5 (calc) (ref 1.0–2.5)
ALBUMIN MSPROF: 4.5 g/dL (ref 3.6–5.1)
ALKALINE PHOSPHATASE (APISO): 84 U/L (ref 33–130)
ALT: 16 U/L (ref 6–29)
AST: 20 U/L (ref 10–35)
BILIRUBIN TOTAL: 0.7 mg/dL (ref 0.2–1.2)
BUN/Creatinine Ratio: 40 (calc) — ABNORMAL HIGH (ref 6–22)
BUN: 41 mg/dL — ABNORMAL HIGH (ref 7–25)
CALCIUM: 10.1 mg/dL (ref 8.6–10.4)
CHLORIDE: 97 mmol/L — AB (ref 98–110)
CO2: 26 mmol/L (ref 20–32)
Creat: 1.02 mg/dL — ABNORMAL HIGH (ref 0.60–0.93)
GLOBULIN: 3 g/dL (ref 1.9–3.7)
Glucose, Bld: 92 mg/dL (ref 65–99)
POTASSIUM: 4.6 mmol/L (ref 3.5–5.3)
Sodium: 137 mmol/L (ref 135–146)
Total Protein: 7.5 g/dL (ref 6.1–8.1)

## 2016-09-27 LAB — LIPID PANEL
Cholesterol: 197 mg/dL (ref <200)
HDL: 64 mg/dL (ref >50)
LDL Cholesterol (Calc): 113 mg/dL (calc) — ABNORMAL HIGH
Non-HDL Cholesterol (Calc): 133 mg/dL (calc) — ABNORMAL HIGH (ref <130)
Total CHOL/HDL Ratio: 3.1 (calc) (ref <5.0)
Triglycerides: 101 mg/dL (ref <150)

## 2016-09-27 LAB — HEPATITIS C ANTIBODY
HEP C AB: NONREACTIVE
SIGNAL TO CUT-OFF: 0.01 (ref <1.00)

## 2016-09-27 LAB — TSH: TSH: 1.64 mIU/L (ref 0.40–4.50)

## 2016-09-27 NOTE — Telephone Encounter (Signed)
Patient has been advised. KW 

## 2016-09-27 NOTE — Telephone Encounter (Signed)
-----   Message from Margo Common, [place] sent at 09/26/2016  6:29 PM EDT ----- Preliminary lab reports show some elevation of LDL cholesterol. Kidney function improving and potassium back to normal. Continue Pravastatin 40 mg qd and low fat diet with exercise. Awaiting CBC, TSH and Hep C reports.

## 2016-09-28 ENCOUNTER — Telehealth: Payer: Self-pay | Admitting: *Deleted

## 2016-09-28 ENCOUNTER — Other Ambulatory Visit: Payer: Self-pay | Admitting: Family Medicine

## 2016-09-28 DIAGNOSIS — M5441 Lumbago with sciatica, right side: Principal | ICD-10-CM

## 2016-09-28 DIAGNOSIS — G8929 Other chronic pain: Secondary | ICD-10-CM

## 2016-09-28 NOTE — Telephone Encounter (Signed)
-----   Message from Margo Common, [place] sent at 09/28/2016  8:11 AM EDT ----- Anterior shift of vertebra and disc bulge above the fusion site of years ago. Cage seems intact and solid. Should take these films to her neurosurgeon for follow up evaluation.

## 2016-09-28 NOTE — Telephone Encounter (Signed)
Schedule neurosurgical referral for chronic back pain (anterolisthesis L3 on L4 with disc bulge rightward and L2-L3 endplate degeneration with facet arthropathy). Order placed in chart.

## 2016-09-28 NOTE — Telephone Encounter (Signed)
Patient was notified of results. Expressed understanding. Patient is requesting a referral to neurosurgeon. She said that she has only seen Dr. Marry Guan at [place] for her knee. She has not seen a neurosurgeon in this area.

## 2016-10-23 DIAGNOSIS — M48061 Spinal stenosis, lumbar region without neurogenic claudication: Secondary | ICD-10-CM | POA: Insufficient documentation

## 2016-10-23 DIAGNOSIS — M545 Low back pain: Secondary | ICD-10-CM | POA: Diagnosis not present

## 2016-10-23 DIAGNOSIS — M48062 Spinal stenosis, lumbar region with neurogenic claudication: Secondary | ICD-10-CM | POA: Diagnosis not present

## 2016-10-23 DIAGNOSIS — M4316 Spondylolisthesis, lumbar region: Secondary | ICD-10-CM | POA: Diagnosis not present

## 2016-10-23 DIAGNOSIS — M5416 Radiculopathy, lumbar region: Secondary | ICD-10-CM | POA: Diagnosis not present

## 2016-11-07 ENCOUNTER — Other Ambulatory Visit: Payer: Self-pay | Admitting: Neurosurgery

## 2016-11-07 DIAGNOSIS — M4316 Spondylolisthesis, lumbar region: Secondary | ICD-10-CM

## 2016-11-15 ENCOUNTER — Other Ambulatory Visit: Payer: Self-pay | Admitting: Family Medicine

## 2016-11-15 DIAGNOSIS — M25551 Pain in right hip: Secondary | ICD-10-CM

## 2016-11-21 ENCOUNTER — Ambulatory Visit
Admission: RE | Admit: 2016-11-21 | Discharge: 2016-11-21 | Disposition: A | Discharge status: Home / Self Care | Payer: Medicare HMO | Source: Ambulatory Visit | Attending: Neurosurgery | Admitting: Neurosurgery

## 2016-11-21 DIAGNOSIS — M48061 Spinal stenosis, lumbar region without neurogenic claudication: Secondary | ICD-10-CM | POA: Diagnosis not present

## 2016-11-21 DIAGNOSIS — M4316 Spondylolisthesis, lumbar region: Secondary | ICD-10-CM

## 2016-11-21 MED ORDER — GADOBENATE DIMEGLUMINE 529 MG/ML IV SOLN
7.0000 mL | Freq: Once | INTRAVENOUS | Status: AC | PRN
  Administered 2016-11-21: 7 mL via INTRAVENOUS

## 2016-11-24 DIAGNOSIS — M5136 Other intervertebral disc degeneration, lumbar region: Secondary | ICD-10-CM | POA: Diagnosis not present

## 2016-11-24 DIAGNOSIS — M4316 Spondylolisthesis, lumbar region: Secondary | ICD-10-CM | POA: Diagnosis not present

## 2016-11-28 ENCOUNTER — Other Ambulatory Visit: Payer: Self-pay | Admitting: Neurosurgery

## 2016-12-14 ENCOUNTER — Encounter (HOSPITAL_COMMUNITY)
Admission: RE | Admit: 2016-12-14 | Discharge: 2016-12-14 | Disposition: A | Discharge status: Home / Self Care | Payer: Medicare HMO | Source: Ambulatory Visit | Attending: Neurosurgery | Admitting: Neurosurgery

## 2016-12-14 ENCOUNTER — Other Ambulatory Visit: Payer: Self-pay

## 2016-12-14 ENCOUNTER — Encounter (HOSPITAL_COMMUNITY): Payer: Self-pay

## 2016-12-14 DIAGNOSIS — Z01818 Encounter for other preprocedural examination: Secondary | ICD-10-CM | POA: Insufficient documentation

## 2016-12-14 DIAGNOSIS — R9431 Abnormal electrocardiogram [ECG] [EKG]: Secondary | ICD-10-CM | POA: Diagnosis not present

## 2016-12-14 DIAGNOSIS — R001 Bradycardia, unspecified: Secondary | ICD-10-CM | POA: Insufficient documentation

## 2016-12-14 DIAGNOSIS — I10 Essential (primary) hypertension: Secondary | ICD-10-CM | POA: Insufficient documentation

## 2016-12-14 DIAGNOSIS — Z0183 Encounter for blood typing: Secondary | ICD-10-CM | POA: Diagnosis not present

## 2016-12-14 DIAGNOSIS — Z01812 Encounter for preprocedural laboratory examination: Secondary | ICD-10-CM | POA: Insufficient documentation

## 2016-12-14 HISTORY — DX: Cardiac murmur, unspecified: R01.1

## 2016-12-14 LAB — CBC
HCT: 46.2 % — ABNORMAL HIGH (ref 36.0–46.0)
HEMOGLOBIN: 16.3 g/dL — AB (ref 12.0–15.0)
MCH: 31 pg (ref 26.0–34.0)
MCHC: 35.3 g/dL (ref 30.0–36.0)
MCV: 88 fL (ref 78.0–100.0)
Platelets: 235 10*3/uL (ref 150–400)
RBC: 5.25 MIL/uL — AB (ref 3.87–5.11)
RDW: 14.4 % (ref 11.5–15.5)
WBC: 10.3 10*3/uL (ref 4.0–10.5)

## 2016-12-14 LAB — BASIC METABOLIC PANEL
Anion gap: 9 (ref 5–15)
BUN: 37 mg/dL — ABNORMAL HIGH (ref 6–20)
CHLORIDE: 100 mmol/L — AB (ref 101–111)
CO2: 26 mmol/L (ref 22–32)
CREATININE: 1.04 mg/dL — AB (ref 0.44–1.00)
Calcium: 9.8 mg/dL (ref 8.9–10.3)
GFR calc non Af Amer: 53 mL/min — ABNORMAL LOW (ref >60)
GLUCOSE: 94 mg/dL (ref 65–99)
Potassium: 3.6 mmol/L (ref 3.5–5.1)
Sodium: 135 mmol/L (ref 135–145)

## 2016-12-14 LAB — TYPE AND SCREEN
ABO/RH(D): A POS
ANTIBODY SCREEN: NEGATIVE

## 2016-12-14 LAB — SURGICAL PCR SCREEN
MRSA, PCR: NEGATIVE
STAPHYLOCOCCUS AUREUS: NEGATIVE

## 2016-12-14 LAB — ABO/RH: ABO/RH(D): A POS

## 2016-12-14 NOTE — Pre-Procedure Instructions (Signed)
Molly Mitchell  12/14/2016      [id number] [place], [place] [address] Phone: [phone] Fax: [phone]    Your procedure is scheduled on Dec 3  Report to Molly [facility] at [place].M.  Call this number if you have problems the morning of surgery:  [phone]   Remember:  Do not eat food or drink liquids after midnight.  Take these medicines the morning of surgery with A SIP OF WATER Albuterol (Proventil) inhaler if needed, atenolol (Tenormin), Methocarbamol (Robaxin) if needed  Stop taking Vitamins, Fish Oil, Mobic, Aleve, Ibuprofen, Advil, Motrin, BC's, Goody's, herbal mediations, Aspirin Bring your inhaler with you on the day of surgery.    Do not wear jewelry, make-up or nail polish.  Do not wear lotions, powders, or perfumes, or deoderant.  Do not shave 48 hours prior to surgery.  Men may shave face and neck.  Do not bring valuables to the [facility].  [facility] is not responsible for any belongings or valuables.  Contacts, dentures or bridgework may not be worn into surgery.  Leave your suitcase in the car.  After surgery it may be brought to your room.  For patients admitted to the [facility], discharge time will be determined by your treatment team.  Patients discharged the day of surgery will not be allowed to drive home.     Special instructions:  [facility] - Preparing for Surgery  Before surgery, you can play an important role.  Because skin is not sterile, your skin needs to be as free of germs as possible.  You can reduce the number of germs on you skin by washing with CHG (chlorahexidine gluconate) soap before surgery.  CHG is an antiseptic cleaner which kills germs and bonds with the skin to continue killing germs even after washing.  Please DO NOT use if you have an allergy to CHG or antibacterial soaps.  If your skin becomes reddened/irritated stop using the CHG and inform your nurse  when you arrive at [facility].  Do not shave (including legs and underarms) for at least 48 hours prior to the first CHG shower.  You may shave your face.  Please follow these instructions carefully:   1.  Shower with CHG Soap the night before surgery and the  morning of Surgery.  2.  If you choose to wash your hair, wash your hair first as usual with your   normal shampoo.  3.  After you shampoo, rinse your hair and body thoroughly to remove the  Shampoo.  4.  Use CHG as you would any other liquid soap.  You can apply chg directly       to the skin and wash gently with scrungie or a clean washcloth.  5.  Apply the CHG Soap to your body ONLY FROM THE NECK DOWN.   Do not use on open wounds or open sores.  Avoid contact with your eyes, ears, mouth and genitals (private parts).  Wash genitals (private parts)  with your normal soap.  6.  Wash thoroughly, paying special attention to the area where your surgery will be performed.  7.  Thoroughly rinse your body with warm water from the neck down.  8.  DO NOT shower/wash with your normal soap after using and rinsing off  the CHG Soap.  9.  Pat yourself dry with a clean towel.  10.  Wear clean pajamas.            11.  Place clean sheets on your bed the night of your first shower and do not  sleep with pets.  Day of Surgery  Do not apply any lotions/deoderants the morning of surgery.  Please wear clean clothes to the [facility]/surgery center.     Please read over the following fact sheets that you were given. Pain Booklet, Coughing and Deep Breathing, MRSA Information and Surgical Site Infection Prevention

## 2016-12-14 NOTE — Progress Notes (Signed)
PCP is Dr. [name] Cardiologist is Serafina Royals [facility]) Denies chest pain, fever, or cough. Denies ever having card cath. States she had a stress test and echo many years ago and everything was normal.

## 2016-12-15 NOTE — Progress Notes (Signed)
Requesting EKG from [facility] to be faxed to [phone]. Spoke with Mardene Celeste at [facility], Dr. Alveria Apley office.

## 2016-12-17 NOTE — Anesthesia Preprocedure Evaluation (Addendum)
Anesthesia Evaluation  Patient identified by MRN, date of birth, ID band Patient awake    Reviewed: Allergy & Precautions, H&P , Patient's Chart, lab work & pertinent test results  Airway Mallampati: II  TM Distance: >3 FB Neck ROM: full    Dental no notable dental hx.    Pulmonary asthma , former smoker,    Pulmonary exam normal breath sounds clear to auscultation       Cardiovascular Exercise Tolerance: Good hypertension,  Rhythm:regular Rate:Normal     Neuro/Psych    GI/Hepatic   Endo/Other    Renal/GU      Musculoskeletal   Abdominal   Peds  Hematology   Anesthesia Other Findings   Reproductive/Obstetrics                            Anesthesia Physical Anesthesia Plan  ASA: II  Anesthesia Plan: General   Post-op Pain Management:    Induction: Intravenous  PONV Risk Score and Plan: 2 and Treatment may vary due to age or medical condition and Ondansetron  Airway Management Planned: Oral ETT  Additional Equipment:   Intra-op Plan:   Post-operative Plan: Extubation in OR  Informed Consent: I have reviewed the patients History and Physical, chart, labs and discussed the procedure including the risks, benefits and alternatives for the proposed anesthesia with the patient or authorized representative who has indicated his/her understanding and acceptance.     Plan Discussed with:   Anesthesia Plan Comments: (  )        Anesthesia Quick Evaluation

## 2016-12-18 ENCOUNTER — Inpatient Hospital Stay (HOSPITAL_COMMUNITY): Payer: Medicare HMO | Admitting: Anesthesiology

## 2016-12-18 ENCOUNTER — Encounter (HOSPITAL_COMMUNITY)
Admission: RE | Disposition: A | Discharge status: Home / Self Care | Payer: Self-pay | Source: Ambulatory Visit | Attending: Neurosurgery

## 2016-12-18 ENCOUNTER — Encounter (HOSPITAL_COMMUNITY): Payer: Self-pay | Admitting: *Deleted

## 2016-12-18 ENCOUNTER — Other Ambulatory Visit: Payer: Self-pay

## 2016-12-18 ENCOUNTER — Inpatient Hospital Stay (HOSPITAL_COMMUNITY)
Admission: RE | Admit: 2016-12-18 | Discharge: 2016-12-19 | DRG: 454 | Disposition: A | Discharge status: Home / Self Care | Payer: Medicare HMO | Source: Ambulatory Visit | Attending: Neurosurgery | Admitting: Neurosurgery

## 2016-12-18 ENCOUNTER — Inpatient Hospital Stay (HOSPITAL_COMMUNITY): Payer: Medicare HMO

## 2016-12-18 DIAGNOSIS — M960 Pseudarthrosis after fusion or arthrodesis: Secondary | ICD-10-CM | POA: Diagnosis present

## 2016-12-18 DIAGNOSIS — Z79899 Other long term (current) drug therapy: Secondary | ICD-10-CM

## 2016-12-18 DIAGNOSIS — E785 Hyperlipidemia, unspecified: Secondary | ICD-10-CM | POA: Diagnosis present

## 2016-12-18 DIAGNOSIS — E875 Hyperkalemia: Secondary | ICD-10-CM | POA: Diagnosis not present

## 2016-12-18 DIAGNOSIS — Y838 Other surgical procedures as the cause of abnormal reaction of the patient, or of later complication, without mention of misadventure at the time of the procedure: Secondary | ICD-10-CM | POA: Diagnosis present

## 2016-12-18 DIAGNOSIS — M4316 Spondylolisthesis, lumbar region: Secondary | ICD-10-CM | POA: Diagnosis not present

## 2016-12-18 DIAGNOSIS — Z96652 Presence of left artificial knee joint: Secondary | ICD-10-CM | POA: Diagnosis present

## 2016-12-18 DIAGNOSIS — Z90710 Acquired absence of both cervix and uterus: Secondary | ICD-10-CM | POA: Diagnosis not present

## 2016-12-18 DIAGNOSIS — M4156 Other secondary scoliosis, lumbar region: Secondary | ICD-10-CM | POA: Diagnosis not present

## 2016-12-18 DIAGNOSIS — Z888 Allergy status to other drugs, medicaments and biological substances status: Secondary | ICD-10-CM

## 2016-12-18 DIAGNOSIS — E876 Hypokalemia: Secondary | ICD-10-CM | POA: Diagnosis present

## 2016-12-18 DIAGNOSIS — Z881 Allergy status to other antibiotic agents status: Secondary | ICD-10-CM

## 2016-12-18 DIAGNOSIS — M5116 Intervertebral disc disorders with radiculopathy, lumbar region: Secondary | ICD-10-CM | POA: Diagnosis present

## 2016-12-18 DIAGNOSIS — M48061 Spinal stenosis, lumbar region without neurogenic claudication: Secondary | ICD-10-CM | POA: Diagnosis not present

## 2016-12-18 DIAGNOSIS — M48062 Spinal stenosis, lumbar region with neurogenic claudication: Principal | ICD-10-CM | POA: Diagnosis present

## 2016-12-18 DIAGNOSIS — I10 Essential (primary) hypertension: Secondary | ICD-10-CM | POA: Diagnosis present

## 2016-12-18 DIAGNOSIS — Z8249 Family history of ischemic heart disease and other diseases of the circulatory system: Secondary | ICD-10-CM | POA: Diagnosis not present

## 2016-12-18 DIAGNOSIS — D62 Acute posthemorrhagic anemia: Secondary | ICD-10-CM | POA: Diagnosis not present

## 2016-12-18 DIAGNOSIS — Z981 Arthrodesis status: Secondary | ICD-10-CM

## 2016-12-18 DIAGNOSIS — Z87891 Personal history of nicotine dependence: Secondary | ICD-10-CM | POA: Diagnosis not present

## 2016-12-18 DIAGNOSIS — Z419 Encounter for procedure for purposes other than remedying health state, unspecified: Secondary | ICD-10-CM

## 2016-12-18 DIAGNOSIS — M545 Low back pain: Secondary | ICD-10-CM | POA: Diagnosis not present

## 2016-12-18 SURGERY — POSTERIOR LUMBAR FUSION 2 LEVEL
Anesthesia: General | Site: Spine Lumbar

## 2016-12-18 MED ORDER — ZOLPIDEM TARTRATE 5 MG PO TABS: 5.0000 mg | ORAL_TABLET | Freq: Every evening | ORAL | Status: DC | PRN

## 2016-12-18 MED ORDER — OXYCODONE HCL 5 MG PO TABS
10.0000 mg | ORAL_TABLET | ORAL | Status: DC | PRN
  Administered 2016-12-18 – 2016-12-19 (×3): 10 mg via ORAL
  Filled 2016-12-18 (×2): qty 2

## 2016-12-18 MED ORDER — VITAMIN D 1000 UNITS PO TABS
1000.0000 [IU] | ORAL_TABLET | Freq: Every day | ORAL | Status: DC
  Administered 2016-12-19: 1000 [IU] via ORAL
  Filled 2016-12-18: qty 1

## 2016-12-18 MED ORDER — BUPIVACAINE-EPINEPHRINE (PF) 0.5% -1:200000 IJ SOLN
INTRAMUSCULAR | Status: DC | PRN
  Administered 2016-12-18: 10 mL

## 2016-12-18 MED ORDER — PRAVASTATIN SODIUM 40 MG PO TABS
40.0000 mg | ORAL_TABLET | Freq: Every day | ORAL | Status: DC
  Administered 2016-12-18: 40 mg via ORAL
  Filled 2016-12-18: qty 1

## 2016-12-18 MED ORDER — LACTATED RINGERS IV SOLN
INTRAVENOUS | Status: DC | PRN
  Administered 2016-12-18 (×2): via INTRAVENOUS

## 2016-12-18 MED ORDER — BISACODYL 10 MG RE SUPP: 10.0000 mg | Freq: Every day | RECTAL | Status: DC | PRN

## 2016-12-18 MED ORDER — VANCOMYCIN HCL 1000 MG IV SOLR
INTRAVENOUS | Status: DC | PRN
  Administered 2016-12-18: 1000 mg via TOPICAL

## 2016-12-18 MED ORDER — ONDANSETRON HCL 4 MG/2ML IJ SOLN: 4.0000 mg | Freq: Four times a day (QID) | INTRAMUSCULAR | Status: DC | PRN

## 2016-12-18 MED ORDER — CYCLOBENZAPRINE HCL 10 MG PO TABS
ORAL_TABLET | ORAL | Status: AC
  Filled 2016-12-18: qty 1

## 2016-12-18 MED ORDER — PROPOFOL 10 MG/ML IV BOLUS
INTRAVENOUS | Status: DC | PRN
  Administered 2016-12-18: 110 mg via INTRAVENOUS

## 2016-12-18 MED ORDER — VANCOMYCIN HCL IN DEXTROSE 1-5 GM/200ML-% IV SOLN
INTRAVENOUS | Status: AC
  Filled 2016-12-18: qty 200

## 2016-12-18 MED ORDER — FENTANYL CITRATE (PF) 250 MCG/5ML IJ SOLN
INTRAMUSCULAR | Status: AC
  Filled 2016-12-18: qty 5

## 2016-12-18 MED ORDER — KETAMINE HCL 10 MG/ML IJ SOLN
INTRAMUSCULAR | Status: DC | PRN
  Administered 2016-12-18: 35 mg via INTRAVENOUS

## 2016-12-18 MED ORDER — THROMBIN (RECOMBINANT) 5000 UNITS EX SOLR
CUTANEOUS | Status: AC
  Filled 2016-12-18: qty 5000

## 2016-12-18 MED ORDER — ALBUMIN HUMAN 5 % IV SOLN
INTRAVENOUS | Status: DC | PRN
  Administered 2016-12-18 (×2): via INTRAVENOUS

## 2016-12-18 MED ORDER — LACTATED RINGERS IV SOLN
INTRAVENOUS | Status: DC
  Administered 2016-12-18 (×2): via INTRAVENOUS

## 2016-12-18 MED ORDER — MORPHINE SULFATE (PF) 4 MG/ML IV SOLN
4.0000 mg | INTRAVENOUS | Status: DC | PRN
  Administered 2016-12-18: 4 mg via INTRAVENOUS
  Filled 2016-12-18: qty 1

## 2016-12-18 MED ORDER — ONDANSETRON HCL 4 MG/2ML IJ SOLN
INTRAMUSCULAR | Status: AC
  Filled 2016-12-18: qty 2

## 2016-12-18 MED ORDER — DEXAMETHASONE SODIUM PHOSPHATE 10 MG/ML IJ SOLN
INTRAMUSCULAR | Status: AC
  Filled 2016-12-18: qty 1

## 2016-12-18 MED ORDER — VANCOMYCIN HCL IN DEXTROSE 1-5 GM/200ML-% IV SOLN
1000.0000 mg | INTRAVENOUS | Status: AC
  Administered 2016-12-18: 1000 mg via INTRAVENOUS

## 2016-12-18 MED ORDER — DEXTROSE 5 % IV SOLN
INTRAVENOUS | Status: DC | PRN
  Administered 2016-12-18: 25 ug/min via INTRAVENOUS

## 2016-12-18 MED ORDER — VANCOMYCIN HCL 1000 MG IV SOLR
INTRAVENOUS | Status: AC
  Filled 2016-12-18: qty 1000

## 2016-12-18 MED ORDER — CALCIUM-MAGNESIUM 500-250 MG PO TABS: 2.0000 | ORAL_TABLET | Freq: Every day | ORAL | Status: DC

## 2016-12-18 MED ORDER — SODIUM CHLORIDE 0.9 % IR SOLN
Status: DC | PRN
  Administered 2016-12-18: 500 mL

## 2016-12-18 MED ORDER — HEMOSTATIC AGENTS (NO CHARGE) OPTIME
TOPICAL | Status: DC | PRN
  Administered 2016-12-18 (×3): 1 via TOPICAL

## 2016-12-18 MED ORDER — SODIUM CHLORIDE 0.9% FLUSH: 3.0000 mL | INTRAVENOUS | Status: DC | PRN

## 2016-12-18 MED ORDER — HYDROMORPHONE HCL 1 MG/ML IJ SOLN: 0.2500 mg | INTRAMUSCULAR | Status: DC | PRN

## 2016-12-18 MED ORDER — SODIUM CHLORIDE 0.9% FLUSH: 3.0000 mL | Freq: Two times a day (BID) | INTRAVENOUS | Status: DC

## 2016-12-18 MED ORDER — ACETAMINOPHEN 10 MG/ML IV SOLN
INTRAVENOUS | Status: AC
  Filled 2016-12-18: qty 100

## 2016-12-18 MED ORDER — ACETAMINOPHEN 650 MG RE SUPP: 650.0000 mg | RECTAL | Status: DC | PRN

## 2016-12-18 MED ORDER — DEXAMETHASONE SODIUM PHOSPHATE 10 MG/ML IJ SOLN
INTRAMUSCULAR | Status: DC | PRN
  Administered 2016-12-18: 10 mg via INTRAVENOUS

## 2016-12-18 MED ORDER — SODIUM CHLORIDE 0.9 % IV SOLN: 250.0000 mL | INTRAVENOUS | Status: DC

## 2016-12-18 MED ORDER — OXYCODONE HCL 5 MG PO TABS: 5.0000 mg | ORAL_TABLET | ORAL | Status: DC | PRN

## 2016-12-18 MED ORDER — 0.9 % SODIUM CHLORIDE (POUR BTL) OPTIME
TOPICAL | Status: DC | PRN
  Administered 2016-12-18: 1000 mL

## 2016-12-18 MED ORDER — PHENOL 1.4 % MT LIQD: 1.0000 | OROMUCOSAL | Status: DC | PRN

## 2016-12-18 MED ORDER — ONDANSETRON HCL 4 MG/2ML IJ SOLN
INTRAMUSCULAR | Status: DC | PRN
  Administered 2016-12-18: 4 mg via INTRAVENOUS

## 2016-12-18 MED ORDER — BUPIVACAINE-EPINEPHRINE (PF) 0.5% -1:200000 IJ SOLN
INTRAMUSCULAR | Status: AC
  Filled 2016-12-18: qty 30

## 2016-12-18 MED ORDER — ONDANSETRON HCL 4 MG PO TABS: 4.0000 mg | ORAL_TABLET | Freq: Four times a day (QID) | ORAL | Status: DC | PRN

## 2016-12-18 MED ORDER — GLYCOPYRROLATE 0.2 MG/ML IJ SOLN
INTRAMUSCULAR | Status: DC | PRN
  Administered 2016-12-18: 0.1 mg via INTRAVENOUS
  Administered 2016-12-18: 0.2 mg via INTRAVENOUS

## 2016-12-18 MED ORDER — FENTANYL CITRATE (PF) 100 MCG/2ML IJ SOLN: 25.0000 ug | INTRAMUSCULAR | Status: DC | PRN

## 2016-12-18 MED ORDER — OXYCODONE HCL 5 MG PO TABS
ORAL_TABLET | ORAL | Status: AC
  Filled 2016-12-18: qty 2

## 2016-12-18 MED ORDER — PROPOFOL 10 MG/ML IV BOLUS
INTRAVENOUS | Status: AC
Start: 2016-12-18 — End: 2016-12-18
  Filled 2016-12-18: qty 20

## 2016-12-18 MED ORDER — MIDAZOLAM HCL 5 MG/5ML IJ SOLN
INTRAMUSCULAR | Status: DC | PRN
  Administered 2016-12-18: 2 mg via INTRAVENOUS

## 2016-12-18 MED ORDER — LIDOCAINE 2% (20 MG/ML) 5 ML SYRINGE
INTRAMUSCULAR | Status: AC
  Filled 2016-12-18: qty 5

## 2016-12-18 MED ORDER — BUPIVACAINE LIPOSOME 1.3 % IJ SUSP
20.0000 mL | Freq: Once | INTRAMUSCULAR | Status: AC
  Administered 2016-12-18: 20 mL
  Filled 2016-12-18: qty 20

## 2016-12-18 MED ORDER — ROCURONIUM BROMIDE 100 MG/10ML IV SOLN
INTRAVENOUS | Status: DC | PRN
  Administered 2016-12-18 (×2): 20 mg via INTRAVENOUS
  Administered 2016-12-18: 30 mg via INTRAVENOUS
  Administered 2016-12-18: 40 mg via INTRAVENOUS
  Administered 2016-12-18 (×2): 10 mg via INTRAVENOUS

## 2016-12-18 MED ORDER — EPHEDRINE 5 MG/ML INJ
INTRAVENOUS | Status: AC
  Filled 2016-12-18: qty 10

## 2016-12-18 MED ORDER — SODIUM CHLORIDE 0.9 % IV SOLN
INTRAVENOUS | Status: DC | PRN
  Administered 2016-12-18: 5 ug/kg/min via INTRAVENOUS

## 2016-12-18 MED ORDER — BACITRACIN ZINC 500 UNIT/GM EX OINT
TOPICAL_OINTMENT | CUTANEOUS | Status: AC
  Filled 2016-12-18: qty 28.35

## 2016-12-18 MED ORDER — COENZYME Q10 300 MG PO CAPS
300.0000 mg | ORAL_CAPSULE | Freq: Every day | ORAL | Status: DC
Start: 2016-12-18 — End: 2016-12-18

## 2016-12-18 MED ORDER — BACITRACIN ZINC 500 UNIT/GM EX OINT
TOPICAL_OINTMENT | CUTANEOUS | Status: DC | PRN
  Administered 2016-12-18: 1 via TOPICAL

## 2016-12-18 MED ORDER — THROMBIN (RECOMBINANT) 20000 UNITS EX SOLR
CUTANEOUS | Status: AC
  Filled 2016-12-18: qty 20000

## 2016-12-18 MED ORDER — LIDOCAINE HCL (CARDIAC) 20 MG/ML IV SOLN
INTRAVENOUS | Status: DC | PRN
  Administered 2016-12-18: 100 mg via INTRAVENOUS

## 2016-12-18 MED ORDER — ADULT MULTIVITAMIN W/MINERALS CH
1.0000 | ORAL_TABLET | Freq: Every day | ORAL | Status: DC
  Administered 2016-12-19: 1 via ORAL
  Filled 2016-12-18 (×2): qty 1

## 2016-12-18 MED ORDER — ALBUTEROL SULFATE (2.5 MG/3ML) 0.083% IN NEBU: 2.5000 mg | INHALATION_SOLUTION | Freq: Four times a day (QID) | RESPIRATORY_TRACT | Status: DC | PRN

## 2016-12-18 MED ORDER — CYCLOBENZAPRINE HCL 10 MG PO TABS
10.0000 mg | ORAL_TABLET | Freq: Three times a day (TID) | ORAL | Status: DC | PRN
  Administered 2016-12-18: 10 mg via ORAL

## 2016-12-18 MED ORDER — PROPOFOL 10 MG/ML IV BOLUS
INTRAVENOUS | Status: AC
  Filled 2016-12-18: qty 20

## 2016-12-18 MED ORDER — SODIUM CHLORIDE 0.9 % IV SOLN
1250.0000 mg | INTRAVENOUS | Status: DC
  Administered 2016-12-19: 1250 mg via INTRAVENOUS
  Filled 2016-12-18: qty 1250

## 2016-12-18 MED ORDER — PHENYLEPHRINE 40 MCG/ML (10ML) SYRINGE FOR IV PUSH (FOR BLOOD PRESSURE SUPPORT)
PREFILLED_SYRINGE | INTRAVENOUS | Status: AC
  Filled 2016-12-18: qty 10

## 2016-12-18 MED ORDER — DOCUSATE SODIUM 100 MG PO CAPS
100.0000 mg | ORAL_CAPSULE | Freq: Two times a day (BID) | ORAL | Status: DC
Start: 2016-12-18 — End: 2016-12-19
  Administered 2016-12-18 – 2016-12-19 (×2): 100 mg via ORAL
  Filled 2016-12-18 (×2): qty 1

## 2016-12-18 MED ORDER — ROCURONIUM BROMIDE 10 MG/ML (PF) SYRINGE
PREFILLED_SYRINGE | INTRAVENOUS | Status: AC
  Filled 2016-12-18: qty 5

## 2016-12-18 MED ORDER — CHLORHEXIDINE GLUCONATE CLOTH 2 % EX PADS: 6.0000 | MEDICATED_PAD | Freq: Once | CUTANEOUS | Status: DC

## 2016-12-18 MED ORDER — ATENOLOL 50 MG PO TABS
50.0000 mg | ORAL_TABLET | Freq: Every day | ORAL | Status: DC
  Filled 2016-12-18: qty 1

## 2016-12-18 MED ORDER — SODIUM CHLORIDE 0.9 % IV SOLN
0.1000 mg/kg/h | INTRAVENOUS | Status: DC
  Filled 2016-12-18: qty 2

## 2016-12-18 MED ORDER — FENTANYL CITRATE (PF) 100 MCG/2ML IJ SOLN
INTRAMUSCULAR | Status: DC | PRN
  Administered 2016-12-18: 100 ug via INTRAVENOUS
  Administered 2016-12-18 (×2): 50 ug via INTRAVENOUS
  Administered 2016-12-18 (×3): 100 ug via INTRAVENOUS

## 2016-12-18 MED ORDER — MENTHOL 3 MG MT LOZG: 1.0000 | LOZENGE | OROMUCOSAL | Status: DC | PRN

## 2016-12-18 MED ORDER — MIDAZOLAM HCL 2 MG/2ML IJ SOLN
INTRAMUSCULAR | Status: AC
  Filled 2016-12-18: qty 2

## 2016-12-18 MED ORDER — TRIAMTERENE-HCTZ 37.5-25 MG PO TABS
1.0000 | ORAL_TABLET | Freq: Every day | ORAL | Status: DC
  Administered 2016-12-19: 1 via ORAL
  Filled 2016-12-18: qty 1

## 2016-12-18 MED ORDER — KETAMINE HCL-SODIUM CHLORIDE 100-0.9 MG/10ML-% IV SOSY
PREFILLED_SYRINGE | INTRAVENOUS | Status: AC
  Filled 2016-12-18: qty 10

## 2016-12-18 MED ORDER — ACETAMINOPHEN 325 MG PO TABS: 650.0000 mg | ORAL_TABLET | ORAL | Status: DC | PRN

## 2016-12-18 MED ORDER — EPHEDRINE SULFATE 50 MG/ML IJ SOLN
INTRAMUSCULAR | Status: DC | PRN
  Administered 2016-12-18 (×3): 10 mg via INTRAVENOUS
  Administered 2016-12-18: 5 mg via INTRAVENOUS
  Administered 2016-12-18: 10 mg via INTRAVENOUS
  Administered 2016-12-18: 5 mg via INTRAVENOUS

## 2016-12-18 MED ORDER — SUGAMMADEX SODIUM 200 MG/2ML IV SOLN
INTRAVENOUS | Status: DC | PRN
  Administered 2016-12-18: 200 mg via INTRAVENOUS

## 2016-12-18 MED ORDER — ACETAMINOPHEN 10 MG/ML IV SOLN
INTRAVENOUS | Status: DC | PRN
  Administered 2016-12-18: 1000 mg via INTRAVENOUS

## 2016-12-18 SURGICAL SUPPLY — 86 items
BAG DECANTER FOR FLEXI CONT (MISCELLANEOUS) ×2 IMPLANT
BASKET BONE COLLECTION (BASKET) ×2 IMPLANT
BENZOIN TINCTURE PRP APPL 2/3 (GAUZE/BANDAGES/DRESSINGS) ×2 IMPLANT
BLADE 15 SAFETY STRL DISP (BLADE) ×2 IMPLANT
BLADE CLIPPER SURG (BLADE) IMPLANT
BOWL SPATULA (MISCELLANEOUS) ×4 IMPLANT
BUR MATCHSTICK NEURO 3.0 LAGG (BURR) ×4 IMPLANT
BUR PRECISION FLUTE 6.0 (BURR) ×2 IMPLANT
CAGE ALTERA 10X31X10-14 15D (Cage) ×2 IMPLANT
CAGE ALTERA 10X31X9-13 15D (Cage) ×2 IMPLANT
CANISTER SUCT 3000ML PPV (MISCELLANEOUS) ×2 IMPLANT
CARTRIDGE OIL MAESTRO DRILL (MISCELLANEOUS) ×1 IMPLANT
CONNECTOR EXPEDIUM SFX SZ A5 (Orthopedic Implant) ×2 IMPLANT
CONT SPEC 4OZ CLIKSEAL STRL BL (MISCELLANEOUS) ×4 IMPLANT
COVER BACK TABLE 60X90IN (DRAPES) ×2 IMPLANT
DIFFUSER DRILL AIR PNEUMATIC (MISCELLANEOUS) ×2 IMPLANT
DRAPE C-ARM 42X72 X-RAY (DRAPES) ×4 IMPLANT
DRAPE HALF SHEET 40X57 (DRAPES) ×4 IMPLANT
DRAPE LAPAROTOMY 100X72X124 (DRAPES) ×2 IMPLANT
DRAPE POUCH INSTRU U-SHP 10X18 (DRAPES) ×2 IMPLANT
DRAPE SURG 17X23 STRL (DRAPES) ×8 IMPLANT
ELECT BLADE 4.0 EZ CLEAN MEGAD (MISCELLANEOUS) ×2
ELECT CAUTERY BLADE 6.4 (BLADE) ×2 IMPLANT
ELECT REM PT RETURN 9FT ADLT (ELECTROSURGICAL) ×2
ELECTRODE BLDE 4.0 EZ CLN MEGD (MISCELLANEOUS) ×1 IMPLANT
ELECTRODE REM PT RTRN 9FT ADLT (ELECTROSURGICAL) ×1 IMPLANT
EVACUATOR 1/8 PVC DRAIN (DRAIN) ×2 IMPLANT
GAUZE SPONGE 4X4 12PLY STRL (GAUZE/BANDAGES/DRESSINGS) ×2 IMPLANT
GAUZE SPONGE 4X4 16PLY XRAY LF (GAUZE/BANDAGES/DRESSINGS) IMPLANT
GLOVE BIO SURGEON STRL SZ 6.5 (GLOVE) ×4 IMPLANT
GLOVE BIO SURGEON STRL SZ8 (GLOVE) ×4 IMPLANT
GLOVE BIO SURGEON STRL SZ8.5 (GLOVE) ×4 IMPLANT
GLOVE BIOGEL PI IND STRL 6.5 (GLOVE) ×1 IMPLANT
GLOVE BIOGEL PI IND STRL 7.0 (GLOVE) ×2 IMPLANT
GLOVE BIOGEL PI IND STRL 7.5 (GLOVE) ×1 IMPLANT
GLOVE BIOGEL PI INDICATOR 6.5 (GLOVE) ×1
GLOVE BIOGEL PI INDICATOR 7.0 (GLOVE) ×2
GLOVE BIOGEL PI INDICATOR 7.5 (GLOVE) ×1
GLOVE EXAM NITRILE LRG STRL (GLOVE) IMPLANT
GLOVE EXAM NITRILE XL STR (GLOVE) IMPLANT
GLOVE EXAM NITRILE XS STR PU (GLOVE) IMPLANT
GLOVE SS BIOGEL STRL SZ 7.5 (GLOVE) ×1 IMPLANT
GLOVE SUPERSENSE BIOGEL SZ 7.5 (GLOVE) ×1
GLOVE SURG SS PI 7.5 STRL IVOR (GLOVE) ×10 IMPLANT
GOWN STRL REUS W/ TWL LRG LVL3 (GOWN DISPOSABLE) ×4 IMPLANT
GOWN STRL REUS W/ TWL XL LVL3 (GOWN DISPOSABLE) ×2 IMPLANT
GOWN STRL REUS W/TWL 2XL LVL3 (GOWN DISPOSABLE) IMPLANT
GOWN STRL REUS W/TWL LRG LVL3 (GOWN DISPOSABLE) ×4
GOWN STRL REUS W/TWL XL LVL3 (GOWN DISPOSABLE) ×2
HEMOSTAT POWDER KIT SURGIFOAM (HEMOSTASIS) ×6 IMPLANT
HEMOSTAT POWDER SURGIFOAM 1G (HEMOSTASIS) IMPLANT
KIT BASIN OR (CUSTOM PROCEDURE TRAY) ×2 IMPLANT
KIT INFUSE X SMALL 1.4CC (Orthopedic Implant) ×2 IMPLANT
KIT ROOM TURNOVER OR (KITS) ×2 IMPLANT
MARKER SKIN DUAL TIP RULER LAB (MISCELLANEOUS) ×2 IMPLANT
NEEDLE HYPO 21X1.5 SAFETY (NEEDLE) ×2 IMPLANT
NEEDLE HYPO 22GX1.5 SAFETY (NEEDLE) ×2 IMPLANT
NS IRRIG 1000ML POUR BTL (IV SOLUTION) ×2 IMPLANT
OIL CARTRIDGE MAESTRO DRILL (MISCELLANEOUS) ×2
PACK LAMINECTOMY NEURO (CUSTOM PROCEDURE TRAY) ×2 IMPLANT
PAD ARMBOARD 7.5X6 YLW CONV (MISCELLANEOUS) ×10 IMPLANT
PATTIES SURGICAL .5 X1 (DISPOSABLE) IMPLANT
PATTIES SURGICAL 1X1 (DISPOSABLE) ×4 IMPLANT
PENCIL BUTTON HOLSTER BLD 10FT (ELECTRODE) ×2 IMPLANT
ROD EXPEDIUM PREBENT 95MM (Rod) ×4 IMPLANT
SCREW CORT FIX FEN 5.5X6X35MM (Screw) ×2 IMPLANT
SCREW CORT FIX FEN 5.5X7X35MM (Screw) IMPLANT
SCREW CORT FIX FEN 5.5X7X40MM (Screw) ×6 IMPLANT
SCREW SET SINGLE INNER (Screw) ×20 IMPLANT
SCREW VIPER 7X45MM (Screw) ×10 IMPLANT
SCREW VIPER 7X50MM (Screw) ×2 IMPLANT
SCREW VIPER 8X35 (Screw) IMPLANT
SPONGE LAP 4X18 X RAY DECT (DISPOSABLE) IMPLANT
SPONGE NEURO XRAY DETECT 1X3 (DISPOSABLE) ×2 IMPLANT
SPONGE SURGIFOAM ABS GEL 100 (HEMOSTASIS) IMPLANT
STRIP BIOACTIVE 20CC 25X100X8 (Miscellaneous) ×4 IMPLANT
STRIP CLOSURE SKIN 1/2X4 (GAUZE/BANDAGES/DRESSINGS) ×2 IMPLANT
SUT VIC AB 1 CT1 18XBRD ANBCTR (SUTURE) ×3 IMPLANT
SUT VIC AB 1 CT1 8-18 (SUTURE) ×3
SUT VIC AB 2-0 CP2 18 (SUTURE) ×4 IMPLANT
SYR 20CC LL (SYRINGE) ×2 IMPLANT
TAPE CLOTH SURG 4X10 WHT LF (GAUZE/BANDAGES/DRESSINGS) ×2 IMPLANT
TOWEL GREEN STERILE (TOWEL DISPOSABLE) ×2 IMPLANT
TOWEL GREEN STERILE FF (TOWEL DISPOSABLE) ×2 IMPLANT
TRAY FOLEY W/METER SILVER 16FR (SET/KITS/TRAYS/PACK) ×2 IMPLANT
WATER STERILE IRR 1000ML POUR (IV SOLUTION) ×2 IMPLANT

## 2016-12-18 NOTE — Progress Notes (Signed)
Orthopedic Tech Progress Note Patient Details:  Molly Mitchell [birth year] [id number] Patient states that family has her brace.  Patient ID: Molly Mitchell, female   DOB: Jul 06, 1945, 71 y.o.   MRN: [id number]   Braulio Bosch 12/18/2016, 6:47 PM

## 2016-12-18 NOTE — Op Note (Signed)
Brief history: The patient is a 71 year old white female who is had a previous L4-5 and L5-S1 decompression, instrumentation, and fusion by another physician years ago.  She has developed recurrent back, buttock and leg pain consistent with neurogenic claudication.  She has failed medical management and was worked up with lumbar x-rays and a lumbar MRI.  This demonstrated a lumbar scoliosis, spondylolisthesis, spinal stenosis, etc.  I discussed the various treatment options with the patient including surgery.  She has weighed the risks, benefits, and alternatives to surgery and decided to proceed with an exploration of her lumbar fusion, removal of old lumbar hardware and at L2-3 and L3-4 decompression, instrumentation and fusion.  Preoperative diagnosis: L2-3 and L3-4 degenerative disc disease, spinal stenosis  lumbago; lumbar radiculopathy  Postoperative diagnosis: The same and lumbar pseudoarthrosis.  Procedure: Bilateral L2-3 laminotomy/foraminotomies and bilateral L3-4 laminectomy to decompress the bilateral L2, L3 and L4 nerve roots(the work required to do this was in addition to the work required to do the posterior lumbar interbody fusion because of the patient's spinal stenosis, facet arthropathy. Etc. requiring a wide decompression of the nerve roots.);  L2-3 and L3-4 transforaminal lumbar interbody fusion with local morselized autograft bone and Kinnex graft extender; insertion of interbody prosthesis at L2-3 and L3-4 (globus peek expandable interbody prosthesis); posterior segmental instrumentation from L2-S1 with Depuy titanium pedicle screws and rods; posterior lateral arthrodesis at L2-3, L3-4, L4-5 and L5-S1 bilaterally with local morselized autograft bone to bone morphogenic protein-soaked collagen sponges, and Kinnex bone graft extender; exploration of lumbar fusion/removal of lumbar hardware  Surgeon: Dr. Earle Gell  Asst.: Dr. Cyndy Freeze  Anesthesia: Gen. endotracheal  Estimated blood  loss: 500 cc  Drains: One medium Hemovac in the epidural space  Complications: None  Description of procedure: The patient was brought to the operating room by the anesthesia team. General endotracheal anesthesia was induced. The patient was turned to the prone position on the Wilson frame. The patient's lumbosacral region was then prepared with Betadine scrub and Betadine solution. Sterile drapes were applied.  I then injected the area to be incised with Marcaine with epinephrine solution. I then used the scalpel to make a linear midline incision over the L2-3, L3-4, L4-5 and L5-S1 interspace, incising through the old surgical scar. I then used electrocautery to perform a bilateral subperiosteal dissection exposing the spinous process and lamina of L2-3, L3-4 and the facets at L4-5 and L5-S1. We then inserted the Verstrac retractor to provide exposure.  We began the exploration of the lumbar fusion by removing the multiple connectors from the old hardware.  We then removed the connectors and the rods.  The screws seemed a bit loose.  We removed the old screws.  I began the decompression by using the high speed drill to perform laminotomies at L2-3 and L3-4 bilaterally. We then used the Kerrison punches to complete the L3-4 laminectomy and to widen the bilateral L2-3 laminotomies and removed the ligamentum flavum at L2-3 and L3-4.  We encountered severe stenosis at L3-4.  We used the Kerrison punches to remove the medial facets at L2-3 and L3-4 bilaterally. We performed wide foraminotomies about the bilateral L2, L3 and L4 nerve roots completing the decompression.  We now turned our attention to the posterior lumbar interbody fusion. I used a scalpel to incise the intervertebral disc at L2-3 and L3-4 bilaterally. I then performed a partial intervertebral discectomy at L2-3 and L3-4 bilaterally using the pituitary forceps. We prepared the vertebral endplates at D6-3 and O7-5  bilaterally for the fusion  by removing the soft tissues with the curettes. We then used the trial spacers to pick the appropriate sized interbody prosthesis. We prefilled his prosthesis with a combination of local morselized autograft bone that we obtained during the decompression as well as Kinnex bone graft extender. We inserted the prefilled prosthesis into the interspace at L2-3 and L3-4, we expanded the prosthesis at both levels. There was a good snug fit of the prosthesis in the interspace. We then filled and the remainder of the intervertebral disc space with local morselized autograft bone and Kinnex. This completed the posterior lumbar interbody arthrodesis.  We now turned attention to the instrumentation.  I thought the old screw holes were a bit lateral and loose.  I decided to redirect the old screws.  Under fluoroscopic guidance we cannulated the bilateral L2, L3, L4, L5 and S1 pedicles with the bone probe. We then removed the bone probe. We then tapped the pedicle with a 6.0 millimeter tap. We then removed the tap. We probed inside the tapped pedicle with a ball probe to rule out cortical breaches. We then inserted a 7.0 x 40, 45 and 50 millimeter pedicle screw into the L2, L3, L4, L5 and S1 pedicles bilaterally under fluoroscopic guidance. We then palpated along the medial aspect of the pedicles to rule out cortical breaches. There were none. The nerve roots were not injured. We then connected the unilateral pedicle screws with a lordotic rod. We compressed the construct and secured the rod in place with the caps.  We placed a cross connector between the rods.  We then tightened the caps appropriately. This completed the instrumentation from L2-S1 bilaterally.  We now turned our attention to the posterior lateral arthrodesis at L2-3, L3-4, L4-5 and L5-S1 bilaterally. We used the high-speed drill to decorticate the remainder of the facets, pars, transverse process at L2-3, L3-4, L4-5 and L5-S1 bilaterally. We then applied a  combination of local morselized autograft bone and Kinnex bone graft extender over these decorticated posterior lateral structures. This completed the posterior lateral arthrodesis.  We then obtained hemostasis using bipolar electrocautery. We irrigated the wound out with bacitracin solution. We inspected the thecal sac and nerve roots and noted they were well decompressed. We then removed the retractor. We placed vancomycin powder in the wound.  We placed a medium Hemovac drain in the epidural space and tunneled it out through a separate stab wound.  We reapproximated patient's thoracolumbar fascia with interrupted #1 Vicryl suture. We reapproximated patient's subcutaneous tissue with interrupted 2-0 Vicryl suture. The reapproximated patient's skin with Steri-Strips and benzoin. The wound was then coated with bacitracin ointment. A sterile dressing was applied. The drapes were removed. The patient was subsequently returned to the supine position where they were extubated by the anesthesia team. He was then transported to the post anesthesia care unit in stable condition. All sponge instrument and needle counts were reportedly correct at the end of this case.

## 2016-12-18 NOTE — Transfer of Care (Signed)
Immediate Anesthesia Transfer of Care Note  Patient: Molly Mitchell  Procedure(s) Performed: POSTERIOR LUMBAR INTERBODY FUSION, LAMINECTOMY, INTERBODY PROSTHESIS,POSTERIOR INSTRUMENTATION, EXPLORE FUSION LUMBAR TWO LUMBAR THREE, LUMBAR THREE- LUMBAR FOUR (N/A Spine Lumbar)  Patient Location: PACU  Anesthesia Type:General  Level of Consciousness: awake, alert , sedated, patient cooperative and responds to stimulation  Airway & Oxygen Therapy: Patient Spontanous Breathing and Patient connected to nasal cannula oxygen  Post-op Assessment: Report given to RN, Post -op Vital signs reviewed and stable and Patient moving all extremities  Post vital signs: Reviewed and stable  Last Vitals:  Vitals:   12/18/16 0823 12/18/16 1648  BP: (!) 159/60 129/82  Pulse: (!) 57 63  Resp: 20 14  Temp: 36.4 C (!) 36.3 C  SpO2: 99% 96%    Last Pain:  Vitals:   12/18/16 1648  TempSrc:   PainSc: (P) Asleep         Complications: No apparent anesthesia complications

## 2016-12-18 NOTE — Anesthesia Procedure Notes (Signed)
Procedure Name: Intubation Date/Time: 12/18/2016 9:48 AM Performed by: Inda Coke, CRNA Pre-anesthesia Checklist: Patient identified, Emergency Drugs available, Suction available and Patient being monitored Patient Re-evaluated:Patient Re-evaluated prior to induction Oxygen Delivery Method: Circle System Utilized Preoxygenation: Pre-oxygenation with 100% oxygen Induction Type: IV induction Ventilation: Mask ventilation without difficulty Laryngoscope Size: Mac and 3 Grade View: Grade I Tube type: Oral Tube size: 7.0 mm Number of attempts: 1 Airway Equipment and Method: Stylet and Oral airway Placement Confirmation: ETT inserted through vocal cords under direct vision,  positive ETCO2 and breath sounds checked- equal and bilateral Secured at: 21 cm Tube secured with: Tape Dental Injury: Teeth and Oropharynx as per pre-operative assessment

## 2016-12-18 NOTE — H&P (Signed)
Subjective: The patient is a 71 year old white female who has had prior L4-5 and L5-S1 instrumentation and fusion by another physician years ago.  She has developed recurrent back, buttock and leg pain consistent with neurogenic claudication.  She has failed medical management and was worked up with a lumbar MRI.  This demonstrated the patient had spinal listhesis and spinal stenosis at L2-3 and L3-4.  I discussed the various treatment options with her.  She has decided to proceed with surgery.  Past Medical History:  Diagnosis Date  . Arthritis   . Heart murmur    as a child some dr say they hear it some say they don't  . HLD (hyperlipidemia)   . Hypertension     Past Surgical History:  Procedure Laterality Date  . ABDOMINAL HYSTERECTOMY  1981  . CORRECTION HAMMER TOE    . JOINT REPLACEMENT Left 03/03/2013  . ROTATOR CUFF REPAIR Right 1981  . SPINAL FUSION  2001   lower back   . SPINAL FUSION  2003   neck  . TOTAL KNEE REVISION Left 07/31/2016   Procedure: TOTAL KNEE REVISION;  Surgeon: Dereck Leep, MD;  Location: [facility] ORS;  Service: Orthopedics;  Laterality: Left;    Allergies  Allergen Reactions  . Amitriptyline Hcl Swelling and Other (See Comments)    Facial swelling  . Duricef [Cefadroxil] Swelling and Other (See Comments)    Facial swelling  . Levofloxacin Other (See Comments)    Severe Shoulder Pain  . Tetanus Toxoid Swelling and Other (See Comments)    Arm swelling  . Erythromycin Rash and Other (See Comments)    Rash on bottom    Social History   Tobacco Use  . Smoking status: Former Smoker    Last attempt to quit: 04/17/1979    Years since quitting: 37.6  . Smokeless tobacco: Never Used  Substance Use Topics  . Alcohol use: No    Alcohol/week: 0.0 oz    Family History  Problem Relation Age of Onset  . Hypertension Brother   . Hypertension Mother   . Hypertension Sister   . Cancer Sister   . Diabetes Paternal Grandmother   . Heart failure Maternal  Grandmother    Prior to Admission medications   Medication Sig Start Date End Date Taking? Authorizing Provider  Ascorbic Acid (VITAMIN C) 1000 MG tablet Take 1,000 mg by mouth daily.    Yes [provider]  atenolol (TENORMIN) 50 MG tablet Take 50 mg by mouth daily.  07/22/12  Yes [provider]  Calcium-Magnesium 500-250 MG TABS Take 2 tablets by mouth daily.   Yes [provider]  Cholecalciferol (VITAMIN D3) 1000 UNITS CAPS Take 1,000 Units by mouth daily.    Yes [provider]  Coenzyme Q10 (EQL COQ10) 300 MG CAPS Take 300 mg by mouth daily.   Yes [provider]  meloxicam (MOBIC) 15 MG tablet TAKE 1 TABLET EVERY DAY Patient taking differently: Take 15 mg by mouth every day 11/16/16  Yes Chrismon, Vickki Muff, PA  Multiple Vitamins-Minerals (MULTIVITAMIN WITH MINERALS) tablet Take 3 tablets by mouth daily.   Yes [provider]  naproxen sodium (ALEVE) 220 MG tablet Take 220 mg by mouth 2 (two) times daily as needed (for pain or headache).   Yes [provider]  OMEGA-3 FATTY ACIDS PO Take 2 g by mouth daily.    Yes [provider]  pravastatin (PRAVACHOL) 40 MG tablet Take 40 mg by mouth at bedtime.  Yes [provider]  triamterene-hydrochlorothiazide (MAXZIDE-25) 37.5-25 MG tablet TAKE 1 TABLET EVERY DAY 07/06/15  Yes Chrismon, Vickki Muff, PA  vitamin E 400 UNIT capsule Take 400 Units by mouth daily.  07/17/14  Yes [provider]  albuterol (PROVENTIL HFA;VENTOLIN HFA) 108 (90 Base) MCG/ACT inhaler Inhale 2 puffs into the lungs every 6 (six) hours as needed for wheezing or shortness of breath. 08/03/16   Trinna Post, PA-C  methocarbamol (ROBAXIN) 500 MG tablet Take 1 tablet (500 mg total) by mouth 4 (four) times daily. Patient not taking: Reported on 12/11/2016 09/15/16   Chrismon, Vickki Muff, PA     Review of Systems  Positive ROS: As above  All other systems have been reviewed and were otherwise  negative with the exception of those mentioned in the HPI and as above.  Objective: Vital signs in last 24 hours: Temp:  [97.6 F (36.4 C)] 97.6 F (36.4 C) (12/03 0823) Pulse Rate:  [57] 57 (12/03 0823) Resp:  [20] 20 (12/03 0823) BP: (159)/(60) 159/60 (12/03 0823) SpO2:  [99 %] 99 % (12/03 0823) Weight:  [65.3 kg (144 lb)] 65.3 kg (144 lb) (12/03 0811)  General Appearance: Alert Head: Normocephalic, without obvious abnormality, atraumatic Eyes: PERRL, conjunctiva/corneas clear, EOM's intact,    Ears: Normal  Throat: Normal  Neck: Supple, Back: The patient's lumbar incision is well-healed. Lungs: Clear to auscultation bilaterally, respirations unlabored Heart: Regular rate and rhythm, no murmur, rub or gallop Abdomen: Soft, non-tender Extremities: Extremities normal, atraumatic, no cyanosis or edema Skin: unremarkable  NEUROLOGIC:   Mental status: alert and oriented,Motor Exam - grossly normal Sensory Exam - grossly normal Reflexes:  Coordination - grossly normal Gait - grossly normal Balance - grossly normal Cranial Nerves: I: smell Not tested  II: visual acuity  OS: Normal  OD: Normal   II: visual fields Full to confrontation  II: pupils Equal, round, reactive to light  III,VII: ptosis None  III,IV,VI: extraocular muscles  Full ROM  V: mastication Normal  V: facial light touch sensation  Normal  V,VII: corneal reflex  Present  VII: facial muscle function - upper  Normal  VII: facial muscle function - lower Normal  VIII: hearing Not tested  IX: soft palate elevation  Normal  IX,X: gag reflex Present  XI: trapezius strength  5/5  XI: sternocleidomastoid strength 5/5  XI: neck flexion strength  5/5  XII: tongue strength  Normal    Data Review Lab Results  Component Value Date   WBC 10.3 12/14/2016   HGB 16.3 (H) 12/14/2016   HCT 46.2 (H) 12/14/2016   MCV 88.0 12/14/2016   PLT 235 12/14/2016   Lab Results  Component Value Date   NA 135 12/14/2016   K  3.6 12/14/2016   CL 100 (L) 12/14/2016   CO2 26 12/14/2016   BUN 37 (H) 12/14/2016   CREATININE 1.04 (H) 12/14/2016   GLUCOSE 94 12/14/2016   Lab Results  Component Value Date   INR 0.96 07/17/2016    Assessment/Plan: L2-3 and L3-4 spondylolisthesis, spinal stenosis, lumbago, lumbar radiculopathy, neurogenic claudication: I have discussed the situation with the patient.  I reviewed her imaging studies with her and pointed out the abnormalities.  We have discussed the various treatment options including surgery.  I have described the surgical treatment option of an L2-3 and L3-4 decompression, instrumentation, and fusion as well as an exploration oh fusion with removal of her old hardware.  I have shown her surgical models.  I have  given her a surgical pamphlet.  We have discussed the risks, benefits, alternatives, expected postoperative course, and likelihood of achieving her goals with surgery.  I have answered all the patient's questions.  She has decided to proceed with surgery.   Ophelia Charter 12/18/2016 9:30 AM

## 2016-12-18 NOTE — Plan of Care (Signed)
  Progressing Safety: Ability to remain free from injury will improve 12/18/2016 2026 - Progressing by Pricilla Holm, Blaise Grieshaber D, RN Activity: Ability to avoid complications of mobility impairment will improve 12/18/2016 2026 - Progressing by Charlena Cross, RN Ability to tolerate increased activity will improve 12/18/2016 2026 - Progressing by Charlena Cross, RN Will remain free from falls 12/18/2016 2026 - Progressing by Margot Chimes D, RN Bowel/Gastric: Gastrointestinal status for postoperative course will improve 12/18/2016 2026 - Progressing by Charlena Cross, RN Education: Ability to verbalize activity precautions or restrictions will improve 12/18/2016 2026 - Progressing by Margot Chimes D, RN Knowledge of the prescribed therapeutic regimen will improve 12/18/2016 2026 - Progressing by Charlena Cross, RN Understanding of discharge needs will improve 12/18/2016 2026 - Progressing by Charlena Cross, RN Physical Regulation: Ability to maintain clinical measurements within normal limits will improve 12/18/2016 2026 - Progressing by Pricilla Holm, Jerre Diguglielmo D, RN Postoperative complications will be avoided or minimized 12/18/2016 2026 - Progressing by Charlena Cross, RN Diagnostic test results will improve 12/18/2016 2026 - Progressing by Margot Chimes D, RN Pain Management: Pain level will decrease 12/18/2016 2026 - Progressing by Charlena Cross, RN Skin Integrity: Signs of wound healing will improve 12/18/2016 2026 - Progressing by Margot Chimes D, RN Health Behavior/Discharge Planning: Identification of resources available to assist in meeting health care needs will improve 12/18/2016 2026 - Progressing by Margot Chimes D, RN Bladder/Genitourinary: Urinary functional status for postoperative course will improve 12/18/2016 2026 - Progressing by Charlena Cross, RN

## 2016-12-18 NOTE — Progress Notes (Signed)
Pharmacy Antibiotic Note  Molly Mitchell is a 71 y.o. female admitted on 12/18/2016 for lumbar surgery.  Pharmacy has been consulted for Vancomycin dosing for surgical prophylaxis. Has hemovac drain in place.    Vancomycin 1gm IV given pre-op ~9am today.  Plan:  Continue Vancomycin with 1250 mg IV q24hrs - begin at [place] on 12/4.  Will follow up for drain removal and discontinuation of Vancomycin.  Follow renal function for any need to adjust regimen.  Height: 5\' 3"  (160 cm) Weight: 144 lb (65.3 kg) IBW/kg (Calculated) : 52.4  Temp (24hrs), Avg:97.5 F (36.4 C), Min:97.3 F (36.3 C), Max:97.6 F (36.4 C)  Recent Labs  Lab 12/14/16 1453  WBC 10.3  CREATININE 1.04*    Estimated Creatinine Clearance: 45.8 mL/min (A) (by C-G formula based on SCr of 1.04 mg/dL (H)).    Allergies  Allergen Reactions  . Amitriptyline Hcl Swelling and Other (See Comments)    Facial swelling  . Duricef [Cefadroxil] Swelling and Other (See Comments)    Facial swelling  . Levofloxacin Other (See Comments)    Severe Shoulder Pain  . Tetanus Toxoid Swelling and Other (See Comments)    Arm swelling  . Erythromycin Rash and Other (See Comments)    Rash on bottom    Antimicrobials this admission:  Vancomycin 12/3>>  Dose adjustments this admission:  n/a  Microbiology results:   11/29 MRSA PCR negative  Thank you for allowing pharmacy to be a part of this patient's care.  Arty Baumgartner, Marcus Pager: [phone] 12/18/2016 8:12 PM

## 2016-12-19 LAB — CBC
HCT: 22.9 % — ABNORMAL LOW (ref 36.0–46.0)
HEMATOCRIT: 27.4 % — AB (ref 36.0–46.0)
HEMOGLOBIN: 8 g/dL — AB (ref 12.0–15.0)
HEMOGLOBIN: 9.4 g/dL — AB (ref 12.0–15.0)
MCH: 31 pg (ref 26.0–34.0)
MCH: 30.5 pg (ref 26.0–34.0)
MCHC: 34.9 g/dL (ref 30.0–36.0)
MCHC: 34.3 g/dL (ref 30.0–36.0)
MCV: 88.8 fL (ref 78.0–100.0)
MCV: 89 fL (ref 78.0–100.0)
Platelets: 125 10*3/uL — ABNORMAL LOW (ref 150–400)
Platelets: 175 10*3/uL (ref 150–400)
RBC: 2.58 MIL/uL — ABNORMAL LOW (ref 3.87–5.11)
RBC: 3.08 MIL/uL — AB (ref 3.87–5.11)
RDW: 14.9 % (ref 11.5–15.5)
RDW: 15.1 % (ref 11.5–15.5)
WBC: 12.6 10*3/uL — ABNORMAL HIGH (ref 4.0–10.5)
WBC: 21.1 10*3/uL — AB (ref 4.0–10.5)

## 2016-12-19 LAB — BASIC METABOLIC PANEL
ANION GAP: 9 (ref 5–15)
Anion gap: 6 (ref 5–15)
BUN: 18 mg/dL (ref 6–20)
BUN: 27 mg/dL — ABNORMAL HIGH (ref 6–20)
CHLORIDE: 112 mmol/L — AB (ref 101–111)
CO2: 20 mmol/L — AB (ref 22–32)
CO2: 29 mmol/L (ref 22–32)
CREATININE: 0.72 mg/dL (ref 0.44–1.00)
Calcium: 5.8 mg/dL — CL (ref 8.9–10.3)
Calcium: 8.4 mg/dL — ABNORMAL LOW (ref 8.9–10.3)
Chloride: 98 mmol/L — ABNORMAL LOW (ref 101–111)
Creatinine, Ser: 1.34 mg/dL — ABNORMAL HIGH (ref 0.44–1.00)
GFR calc Af Amer: >60 mL/min (ref >60)
GFR calc non Af Amer: >60 mL/min (ref >60)
GFR, EST AFRICAN AMERICAN: 45 mL/min — AB (ref >60)
GFR, EST NON AFRICAN AMERICAN: 39 mL/min — AB (ref >60)
GLUCOSE: 106 mg/dL — AB (ref 65–99)
GLUCOSE: 140 mg/dL — AB (ref 65–99)
POTASSIUM: 3.3 mmol/L — AB (ref 3.5–5.1)
Potassium: 2.6 mmol/L — CL (ref 3.5–5.1)
SODIUM: 138 mmol/L (ref 135–145)
SODIUM: 136 mmol/L (ref 135–145)

## 2016-12-19 MED ORDER — POTASSIUM CHLORIDE CRYS ER 20 MEQ PO TBCR: 40.0000 meq | EXTENDED_RELEASE_TABLET | Freq: Two times a day (BID) | ORAL | 0 refills | Status: DC

## 2016-12-19 MED ORDER — POTASSIUM CHLORIDE CRYS ER 20 MEQ PO TBCR: 40.0000 meq | EXTENDED_RELEASE_TABLET | Freq: Two times a day (BID) | ORAL | Status: DC

## 2016-12-19 MED ORDER — FERROUS SULFATE 325 (65 FE) MG PO TABS: 325.0000 mg | ORAL_TABLET | Freq: Two times a day (BID) | ORAL | 3 refills | Status: DC

## 2016-12-19 MED ORDER — DOCUSATE SODIUM 100 MG PO CAPS: 100.0000 mg | ORAL_CAPSULE | Freq: Two times a day (BID) | ORAL | 0 refills | Status: DC

## 2016-12-19 MED ORDER — OXYCODONE HCL 5 MG PO TABS: 5.0000 mg | ORAL_TABLET | ORAL | 0 refills | Status: DC | PRN

## 2016-12-19 MED ORDER — POTASSIUM CHLORIDE CRYS ER 20 MEQ PO TBCR
40.0000 meq | EXTENDED_RELEASE_TABLET | Freq: Three times a day (TID) | ORAL | Status: DC
  Administered 2016-12-19: 40 meq via ORAL
  Filled 2016-12-19: qty 2

## 2016-12-19 MED ORDER — FERROUS SULFATE 325 (65 FE) MG PO TABS
325.0000 mg | ORAL_TABLET | Freq: Two times a day (BID) | ORAL | Status: DC
  Administered 2016-12-19: 325 mg via ORAL
  Filled 2016-12-19: qty 1

## 2016-12-19 MED ORDER — CYCLOBENZAPRINE HCL 10 MG PO TABS: 10.0000 mg | ORAL_TABLET | Freq: Three times a day (TID) | ORAL | 1 refills | Status: DC | PRN

## 2016-12-19 MED FILL — Sodium Chloride IV Soln 0.9%: INTRAVENOUS | Qty: 2000 | Status: AC

## 2016-12-19 MED FILL — Thrombin (Recombinant) For Soln 5000 Unit: CUTANEOUS | Qty: 5000 | Status: AC

## 2016-12-19 MED FILL — Heparin Sodium (Porcine) Inj 1000 Unit/ML: INTRAMUSCULAR | Qty: 30 | Status: AC

## 2016-12-19 NOTE — Progress Notes (Signed)
Pt's critical lab results called in from lab: Potassium 2.6 and Calcium 5.8; will relay to Dr. Arnoldo Morale when he will do his morning rounds. Will continue to monitor. Report will be endorsed to oncoming RN.

## 2016-12-19 NOTE — Progress Notes (Signed)
Patient ID: Molly Mitchell, female   DOB: 09/03/1945, 71 y.o.   MRN: [id number] Subjective: The patient is alert and pleasant.  She looks well.  She wants to go home.  Objective: Vital signs in last 24 hours: Temp:  [97.3 F (36.3 C)-98.3 F (36.8 C)] 98.3 F (36.8 C) (12/04 0400) Pulse Rate:  [56-69] 62 (12/04 0400) Resp:  [12-20] 18 (12/04 0400) BP: (102-159)/(43-82) 112/43 (12/04 0400) SpO2:  [94 %-100 %] 100 % (12/04 0400) Weight:  [65.3 kg (144 lb)] 65.3 kg (144 lb) (12/03 0811)  Intake/Output from previous day: 12/03 0701 - 12/04 0700 In: 3740 [P.O.:240; I.V.:2500; Blood:350; IV Piggyback:500] Out: 2560 [Urine:1350; Drains:460; Blood:750] Intake/Output this shift: No intake/output data recorded.  Physical exam the patient is alert and oriented.  Her dressing is clean and dry.  Her strength is normal.  Lab Results: Recent Labs    12/19/16 0510  WBC 12.6*  HGB 8.0*  HCT 22.9*  PLT 125*   BMET Recent Labs    12/19/16 0510  NA 138  K 2.6*  CL 112*  CO2 20*  GLUCOSE 106*  BUN 18  CREATININE 0.72  CALCIUM 5.8*    Studies/Results: Dg Lumbar Spine 2-3 Views  Result Date: 12/18/2016 CLINICAL DATA:  Lumbar fusion EXAM: LUMBAR SPINE - 2-3 VIEW; DG C-ARM 61-120 MIN COMPARISON:  11/24/2016 FLUOROSCOPY TIME:  Fluoroscopy Time:  48 seconds Radiation Exposure Index (if provided by the fluoroscopic device): Not available Number of Acquired Spot Images: 4 FINDINGS: Pedicle screws are noted from L2-S1 with interbody fusion at all levels. The L2-3 and L3-4 fusion are new from the prior exam. IMPRESSION: L2-S1 fusion Electronically Signed   By: Inez Catalina M.D.   On: 12/18/2016 15:53   Dg C-arm 1-60 Min  Result Date: 12/18/2016 CLINICAL DATA:  Lumbar fusion EXAM: LUMBAR SPINE - 2-3 VIEW; DG C-ARM 61-120 MIN COMPARISON:  11/24/2016 FLUOROSCOPY TIME:  Fluoroscopy Time:  48 seconds Radiation Exposure Index (if provided by the fluoroscopic device): Not available Number of Acquired  Spot Images: 4 FINDINGS: Pedicle screws are noted from L2-S1 with interbody fusion at all levels. The L2-3 and L3-4 fusion are new from the prior exam. IMPRESSION: L2-S1 fusion Electronically Signed   By: Inez Catalina M.D.   On: 12/18/2016 15:53    Assessment/Plan: Postop day #1: The patient is doing well clinically.  She has anemia and hypokalemia.  She is clinically asymptomatic.  Her potassium is quite low.  We will increase her K Dur.  We will also use ferrous sulfate for her anemia.  She may be able to go home later on today.  LOS: 1 day     Ophelia Charter 12/19/2016, 8:02 AM

## 2016-12-19 NOTE — Discharge Summary (Signed)
Physician Discharge Summary  Patient ID: Molly Mitchell MRN: [id number] DOB/AGE: 1945/06/26 71 y.o.  Admit date: 12/18/2016 Discharge date: 12/19/2016  Admission Diagnoses:Lumbar spondylolisthesis, lumbar spinal stenosis, lumbar radiculopathy, neurogenic claudication  Discharge Diagnoses:  The same, lumbar pseudoarthrosis, hypokalemia,  Acute blood-loss anemia Active Problems:   Spondylolisthesis of lumbar region   Discharged Condition: good  Hospital Course:  I performed an exploration of the patient's lumbar fusion with a  L2-3 and L3-4 decompression, instrumentation, and fusion with a redo fusion and instrumentation at L4-5 and L5-S1 on 12/18/2016.  The surgery went well.    On postoperative day 1. The patient was found to be severely hypokalemic.  She was given oral potassium and a repeat BMP demonstrated her potassium had elevated to 3.3. The patient was anemic with a hemoglobin of 8.0 but she was clinically asymptomatic.  She was started on oral iron.  The patient felt well and requests discharge to home.  She was given written in oral discharge instructions.  All her questions were answered.  She was instructed to follow up with her primary doctor regarding her hyperkalemia.  Consults:  Physical therapy Significant Diagnostic Studies: none Treatments: L2-3 and L3-4 decompression, posterior lumbar interbody fusion and interbody prosthesis, redo  Posterior lateral arthrodesis at L4-5 and L5-S1, instrumentation from L2 to sacrum bilaterally Discharge Exam: Blood pressure (!) 132/46, pulse 82, temperature 98.3 F (36.8 C), temperature source Oral, resp. rate 20, height 5\' 3"  (1.6 m), weight 65.3 kg (144 lb), SpO2 100 %.  the patient is alert and pleasant.  Her strength is grossly normal in her lower extremities.  Her dressing is clean and dry.  She looks well.  Disposition:   Home  Discharge Instructions    Call MD for:  difficulty breathing, headache or visual disturbances   Complete  by:  As directed    Call MD for:  extreme fatigue   Complete by:  As directed    Call MD for:  hives   Complete by:  As directed    Call MD for:  persistant dizziness or light-headedness   Complete by:  As directed    Call MD for:  persistant nausea and vomiting   Complete by:  As directed    Call MD for:  redness, tenderness, or signs of infection (pain, swelling, redness, odor or green/yellow discharge around incision site)   Complete by:  As directed    Call MD for:  severe uncontrolled pain   Complete by:  As directed    Call MD for:  temperature >100.4   Complete by:  As directed    Diet - low sodium heart healthy   Complete by:  As directed    Discharge instructions   Complete by:  As directed    Call [phone] for a followup appointment. Take a stool softener while you are using pain medications.   Driving Restrictions   Complete by:  As directed    Do not drive for 2 weeks.   Increase activity slowly   Complete by:  As directed    Lifting restrictions   Complete by:  As directed    Do not lift more than 5 pounds. No excessive bending or twisting.   May shower / Bathe   Complete by:  As directed    He may shower after the pain she is removed 3 days after surgery. Leave the incision alone.   Remove dressing in 48 hours   Complete by:  As directed  Your stitches are under the scan and will dissolve by themselves. The Steri-Strips will fall off after you take a few showers. Do not rub back or pick at the wound, Leave the wound alone.        Signed: Ophelia Charter 12/19/2016, 4:18 PM

## 2016-12-19 NOTE — Anesthesia Postprocedure Evaluation (Signed)
Anesthesia Post Note  Patient: MAKAYLEIGH POLIQUIN  Procedure(s) Performed: POSTERIOR LUMBAR INTERBODY FUSION, LAMINECTOMY, INTERBODY PROSTHESIS,POSTERIOR INSTRUMENTATION, EXPLORE FUSION LUMBAR TWO LUMBAR THREE, LUMBAR THREE- LUMBAR FOUR (N/A Spine Lumbar)     Patient location during evaluation: PACU Anesthesia Type: General Level of consciousness: awake and alert Pain management: pain level controlled Vital Signs Assessment: post-procedure vital signs reviewed and stable Respiratory status: spontaneous breathing, nonlabored ventilation, respiratory function stable and patient connected to nasal cannula oxygen Cardiovascular status: blood pressure returned to baseline and stable Postop Assessment: no apparent nausea or vomiting Anesthetic complications: no    Last Vitals:  Vitals:   12/19/16 0000 12/19/16 0400  BP: (!) 102/50 (!) 112/43  Pulse: (!) 56 62  Resp: 18 18  Temp: 36.4 C 36.8 C  SpO2: 100% 100%    Last Pain:  Vitals:   12/19/16 0454  TempSrc:   PainSc: Tyler Deis

## 2016-12-19 NOTE — Evaluation (Signed)
Physical Therapy Evaluation and DISCHARGE Patient Details Name: Molly Mitchell MRN: [id number] DOB: [birth year] Today's Date: 12/19/2016   History of Present Illness  Pt is a 71 yo female with h/o prevous L4-5 and L5-S1 back surgery. Has developed recurrent back, buttock , and leg pain. Pt underwent Bilateral L2-3 lami and bilateral L3-4 lami on 12/3. PMH: arthritis, HTN. PSH: abdominal hysterectomy, spinal fusion, L TKA 07/2016.  Clinical Impression  Patient is s/p above surgery resulting in the deficits listed below (see PT Problem List). Pt functioning at supervision level. Pt with good carry over and understanding of back precautions. Pt with all recommended DME at home from recent TKA surgery. Pt with 24/7 assist available. Pt with no further acute PT needs at this time. PT SIGNING OFF. Please re-consult if needed in future.    Follow Up Recommendations No PT follow up;Supervision - Intermittent    Equipment Recommendations  None recommended by PT    Recommendations for Other Services       Precautions / Restrictions Precautions Precautions: Back Precaution Booklet Issued: Yes (comment) Precaution Comments: pt wtih good recall from previous surgery and demo's verbal understanding, v/c's for no twisting during function Restrictions Weight Bearing Restrictions: No      Mobility  Bed Mobility Overal bed mobility: Modified Independent             General bed mobility comments: pt able to utilize log roll technique, no physical assist  Transfers Overall transfer level: Modified independent Equipment used: None             General transfer comment: pt with good use of hands, minimal trunk flexion  Ambulation/Gait Ambulation/Gait assistance: Min guard Ambulation Distance (Feet): 300 Feet Assistive device: None Gait Pattern/deviations: Step-through pattern Gait velocity: slow Gait velocity interpretation: Below normal speed for age/gender General Gait Details: pt  with mild dizziness most likely due to lab values and preferred to hold onto hallway rail, no episodes of LOB or knee buckling.   Stairs Stairs: Yes Stairs assistance: Min guard Stair Management: No rails Number of Stairs: 2 General stair comments: pt with good technique  Wheelchair Mobility    Modified Rankin (Stroke Patients Only)       Balance Overall balance assessment: Modified Independent                                           Pertinent Vitals/Pain Pain Assessment: 0-10 Pain Score: 5  Pain Location: back pain at surgical site Pain Descriptors / Indicators: Operative site guarding Pain Intervention(s): Monitored during session    Home Living Family/patient expects to be discharged to:: Private residence Living Arrangements: Spouse/significant other Available Help at Discharge: Family;Available 24 hours/day Type of Home: House Home Access: Stairs to enter Entrance Stairs-Rails: None Entrance Stairs-Number of Steps: 2 Home Layout: One level Home Equipment: Walker - 2 wheels;Shower seat;Cane - single point;Toilet riser;Hand held shower head;Adaptive equipment      Prior Function Level of Independence: Independent         Comments: no difficulties     Hand Dominance   Dominant Hand: Right    Extremity/Trunk Assessment   Upper Extremity Assessment Upper Extremity Assessment: Overall WFL for tasks assessed    Lower Extremity Assessment Lower Extremity Assessment: Overall WFL for tasks assessed    Cervical / Trunk Assessment Cervical / Trunk Assessment: Other exceptions Cervical / Trunk Exceptions: recent  back surgery  Communication   Communication: No difficulties  Cognition Arousal/Alertness: Awake/alert Behavior During Therapy: WFL for tasks assessed/performed Overall Cognitive Status: Within Functional Limits for tasks assessed                                        General Comments General comments  (skin integrity, edema, etc.): pt with hemovac.    Exercises     Assessment/Plan    PT Assessment Patent does not need any further PT services  PT Problem List         PT Treatment Interventions      PT Goals (Current goals can be found in the Care Plan section)  Acute Rehab PT Goals Patient Stated Goal: home today PT Goal Formulation: All assessment and education complete, DC therapy    Frequency     Barriers to discharge        Co-evaluation               AM-PAC PT "6 Clicks" Daily Activity  Outcome Measure Difficulty turning over in bed (including adjusting bedclothes, sheets and blankets)?: None Difficulty moving from lying on back to sitting on the side of the bed? : None Difficulty sitting down on and standing up from a chair with arms (e.g., wheelchair, bedside commode, etc,.)?: None Help needed moving to and from a bed to chair (including a wheelchair)?: A Little Help needed walking in hospital room?: A Little Help needed climbing 3-5 steps with a railing? : A Little 6 Click Score: 21    End of Session Equipment Utilized During Treatment: Gait belt;Back brace Activity Tolerance: Patient tolerated treatment well Patient left: in chair;with call bell/phone within reach Nurse Communication: Mobility status PT Visit Diagnosis: Pain;Difficulty in walking, not elsewhere classified (R26.2) Pain - part of body: (back)    Time: 0867-6195 PT Time Calculation (min) (ACUTE ONLY): 19 min   Charges:   PT Evaluation $PT Eval Moderate Complexity: 1 Mod     PT G CodesKittie Plater, PT, DPT Pager #: [phone] Office #: [phone]   [place] 12/19/2016, 9:14 AM

## 2016-12-19 NOTE — Progress Notes (Signed)
CRITICAL VALUE ALERT  Critical Value:  2.6 - Potassium                         5.9 - Calcium  Date & Time Notied: 12/4/ 2018  0700  Provider Notified: awaiting Dr. Arnoldo Morale  Orders Received/Actions taken: as per Dr's

## 2016-12-19 NOTE — Progress Notes (Signed)
Pt doing well. Pt and husband given D/C instructions with verbal understanding. Rx's were sent to Pt's pharmacy by MD. Pt's incision is clean and dry with no sign of infection. Pt's IV and Hemovac were removed prior to D/C. Pt D/C'd home via wheelchair @ 1650 per MD order. Pt is stable @ D/C and has no other needs at this time. Holli Humbles, RN

## 2017-01-15 DIAGNOSIS — M4316 Spondylolisthesis, lumbar region: Secondary | ICD-10-CM | POA: Diagnosis not present

## 2017-02-01 DIAGNOSIS — E876 Hypokalemia: Secondary | ICD-10-CM | POA: Diagnosis not present

## 2017-02-01 DIAGNOSIS — N183 Chronic kidney disease, stage 3 unspecified: Secondary | ICD-10-CM | POA: Insufficient documentation

## 2017-02-01 DIAGNOSIS — E782 Mixed hyperlipidemia: Secondary | ICD-10-CM | POA: Diagnosis not present

## 2017-02-01 DIAGNOSIS — I10 Essential (primary) hypertension: Secondary | ICD-10-CM | POA: Diagnosis not present

## 2017-02-13 ENCOUNTER — Encounter: Payer: Self-pay | Admitting: Family Medicine

## 2017-02-13 ENCOUNTER — Ambulatory Visit (INDEPENDENT_AMBULATORY_CARE_PROVIDER_SITE_OTHER): Payer: Medicare HMO | Admitting: Family Medicine

## 2017-02-13 VITALS — BP 140/78 | HR 70 | Temp 98.3°F | Wt 144.8 lb

## 2017-02-13 DIAGNOSIS — N183 Chronic kidney disease, stage 3 unspecified: Secondary | ICD-10-CM

## 2017-02-13 DIAGNOSIS — M8589 Other specified disorders of bone density and structure, multiple sites: Secondary | ICD-10-CM

## 2017-02-13 DIAGNOSIS — E782 Mixed hyperlipidemia: Secondary | ICD-10-CM

## 2017-02-13 DIAGNOSIS — I10 Essential (primary) hypertension: Secondary | ICD-10-CM | POA: Diagnosis not present

## 2017-02-13 NOTE — Progress Notes (Signed)
Patient: Molly Mitchell Female    DOB: [birth year]   72 y.o.   MRN: [id number] Visit Date: 02/13/2017  Today's Provider: [name], PA   Chief Complaint  Patient presents with  . Hyperlipidemia  . Hypertension  . Follow-up   Subjective:    HPI   Hypertension, follow-up:  BP Readings from Last 3 Encounters:  02/13/17 140/78  12/19/16 (!) 132/46  12/14/16 135/74    She was last seen for hypertension 4 months ago.  BP at that visit was 128/80. Management since that visit includes continue medication,diet and exercise.She reports good compliance with treatment. Patient reports Dr. Nehemiah Massed discontinued Maxide 2 weeks ago. She is not having side effects.  She is exercising. She is not adherent to low salt diet.   Outside blood pressures are being checked. She is experiencing none.  Patient denies chest pain, chest pressure/discomfort, claudication, dyspnea, exertional chest pressure/discomfort, fatigue, irregular heart beat, lower extremity edema, near-syncope, orthopnea, palpitations, paroxysmal nocturnal dyspnea, syncope and tachypnea.   Cardiovascular risk factors include advanced age (older than 80 for men, 59 for women) and dyslipidemia.  Use of agents associated with hypertension: none.   ------------------------------------------------------------------------    Lipid/Cholesterol, Follow-up:   Last seen for this 4 months ago.  Management since that visit includes continue medication,diet and exercise.  Last Lipid Panel:    Component Value Date/Time   CHOL 197 09/26/2016 1108   CHOL 231 (H) 07/27/2015 1101   TRIG 101 09/26/2016 1108   HDL 64 09/26/2016 1108   HDL 80 07/27/2015 1101   CHOLHDL 3.1 09/26/2016 1108   LDLCALC 132 (H) 07/27/2015 1101    She reports good compliance with treatment. She is not having side effects.   Wt Readings from Last 3 Encounters:  02/13/17 144 lb 12.8 oz (65.7 kg)  12/18/16 144 lb (65.3 kg)  12/14/16 144 lb 6.4 oz  (65.5 kg)    ------------------------------------------------------------------------     Past Medical History:  Diagnosis Date  . Arthritis   . Heart murmur    as a child some dr say they hear it some say they don't  . HLD (hyperlipidemia)   . Hypertension    Past Surgical History:  Procedure Laterality Date  . ABDOMINAL HYSTERECTOMY  1981  . CORRECTION HAMMER TOE    . JOINT REPLACEMENT Left 03/03/2013  . ROTATOR CUFF REPAIR Right 1981  . SPINAL FUSION  2001   lower back   . SPINAL FUSION  2003   neck  . TOTAL KNEE REVISION Left 07/31/2016   Procedure: TOTAL KNEE REVISION;  Surgeon: Dereck Leep, MD;  Location: [facility] ORS;  Service: Orthopedics;  Laterality: Left;   Family History  Problem Relation Age of Onset  . Hypertension Brother   . Hypertension Mother   . Hypertension Sister   . Cancer Sister   . Diabetes Paternal Grandmother   . Heart failure Maternal Grandmother    Allergies  Allergen Reactions  . Amitriptyline Hcl Swelling and Other (See Comments)    Facial swelling  . Duricef [Cefadroxil] Swelling and Other (See Comments)    Facial swelling  . Levofloxacin Other (See Comments)    Severe Shoulder Pain  . Tetanus Toxoid Swelling and Other (See Comments)    Arm swelling  . Erythromycin Rash and Other (See Comments)    Rash on bottom     Current Outpatient Medications:  .  albuterol (PROVENTIL HFA;VENTOLIN HFA) 108 (90 Base) MCG/ACT inhaler, Inhale 2 puffs into the  lungs every 6 (six) hours as needed for wheezing or shortness of breath., Disp: 1 Inhaler, Rfl: 2 .  Ascorbic Acid (VITAMIN C) 1000 MG tablet, Take 1,000 mg by mouth daily. , Disp: , Rfl:  .  atenolol (TENORMIN) 50 MG tablet, Take 50 mg by mouth daily. , Disp: , Rfl:  .  Calcium-Magnesium 500-250 MG TABS, Take 2 tablets by mouth daily., Disp: , Rfl:  .  Cholecalciferol (VITAMIN D3) 1000 UNITS CAPS, Take 1,000 Units by mouth daily. , Disp: , Rfl:  .  Coenzyme Q10 (EQL COQ10) 300 MG CAPS, Take  300 mg by mouth daily., Disp: , Rfl:  .  cyclobenzaprine (FLEXERIL) 10 MG tablet, Take 1 tablet (10 mg total) by mouth 3 (three) times daily as needed for muscle spasms., Disp: 50 tablet, Rfl: 1 .  docusate sodium (COLACE) 100 MG capsule, Take 1 capsule (100 mg total) by mouth 2 (two) times daily., Disp: 60 capsule, Rfl: 0 .  ferrous sulfate 325 (65 FE) MG tablet, Take 1 tablet (325 mg total) by mouth 2 (two) times daily with a meal., Disp: 60 tablet, Rfl: 3 .  meloxicam (MOBIC) 15 MG tablet, , Disp: , Rfl:  .  Multiple Vitamins-Minerals (MULTIVITAMIN WITH MINERALS) tablet, Take 3 tablets by mouth daily., Disp: , Rfl:  .  OMEGA-3 FATTY ACIDS PO, Take 2 g by mouth daily. , Disp: , Rfl:  .  potassium chloride SA (K-DUR,KLOR-CON) 20 MEQ tablet, Take 2 tablets (40 mEq total) by mouth 2 (two) times daily., Disp: 60 tablet, Rfl: 0 .  pravastatin (PRAVACHOL) 40 MG tablet, Take 40 mg by mouth at bedtime., Disp: , Rfl:  .  triamterene-hydrochlorothiazide (MAXZIDE-25) 37.5-25 MG tablet, TAKE 1 TABLET EVERY DAY, Disp: 90 tablet, Rfl: 3 .  vitamin E 400 UNIT capsule, Take 400 Units by mouth daily. , Disp: , Rfl:   Review of Systems  Constitutional: Negative.   Respiratory: Negative.   Cardiovascular: Negative.   Musculoskeletal: Negative.        Hip pain     Social History   Tobacco Use  . Smoking status: Former Smoker    Last attempt to quit: 04/17/1979    Years since quitting: 37.8  . Smokeless tobacco: Never Used  Substance Use Topics  . Alcohol use: No    Alcohol/week: 0.0 oz   Objective:   BP 140/78 (BP Location: Right Arm, Patient Position: Sitting, Cuff Size: Normal)   Pulse 70   Temp 98.3 F (36.8 C) (Oral)   Wt 144 lb 12.8 oz (65.7 kg)   SpO2 99%   BMI 25.65 kg/m    Physical Exam  Constitutional: She is oriented to person, place, and time. She appears well-developed and well-nourished. No distress.  HENT:  Head: Normocephalic and atraumatic.  Right Ear: Hearing and external  ear normal.  Left Ear: Hearing normal.  Nose: Nose normal.  Mouth/Throat: Oropharynx is clear and moist.  Eyes: Conjunctivae and lids are normal. Right eye exhibits no discharge. Left eye exhibits no discharge. No scleral icterus.  Neck: Neck supple.  Cardiovascular: Normal rate and regular rhythm.  Pulmonary/Chest: Effort normal. No respiratory distress.  Abdominal: Soft. Bowel sounds are normal.  Musculoskeletal: Normal range of motion.  Neurological: She is alert and oriented to person, place, and time.  Skin: Skin is intact. No lesion and no rash noted.  Psychiatric: She has a normal mood and affect. Her speech is normal and behavior is normal. Thought content normal.  Assessment & Plan:     1. Benign essential HTN Fair control of BP on the Maxzide-25 qd. No muscle cramps or palpitations. Will recheck labs and follow up pending reports. - Comprehensive metabolic panel - CBC with Differential - Lipid Profile - TSH  2. CKD (chronic kidney disease) stage 3, GFR 30-59 ml/min (HCC) Creatinine 1.34 during hospitalization for Lumbar fusion with L2-3 and L3-4 decompression and redo fusion at L4-5 and L5-S1 by Dr. Arnoldo Morale (neurosurgeon) on 12-18-16. Will recheck labs. - Comprehensive metabolic panel - CBC with Differential - TSH  3. Osteopenia of multiple sites - DG Bone Density  4. Combined fat and carbohydrate induced hyperlipemia Tolerating Pravastatin 40 mg hs and trying to follow a low fat diet. Encouraged to walk for exercise and will get follow up labs. - Comprehensive metabolic panel - Lipid Profile - TSH       [name], PA  [facility]

## 2017-02-14 LAB — CBC WITH DIFFERENTIAL/PLATELET
BASOS ABS: 0 10*3/uL (ref 0.0–0.2)
Basos: 0 %
EOS (ABSOLUTE): 0.1 10*3/uL (ref 0.0–0.4)
Eos: 2 %
HEMOGLOBIN: 12.1 g/dL (ref 11.1–15.9)
Hematocrit: 37 % (ref 34.0–46.6)
Immature Grans (Abs): 0 10*3/uL (ref 0.0–0.1)
Immature Granulocytes: 0 %
LYMPHS ABS: 2.5 10*3/uL (ref 0.7–3.1)
Lymphs: 35 %
MCH: 29.9 pg (ref 26.6–33.0)
MCHC: 32.7 g/dL (ref 31.5–35.7)
MCV: 91 fL (ref 79–97)
MONOCYTES: 9 %
MONOS ABS: 0.7 10*3/uL (ref 0.1–0.9)
NEUTROS ABS: 3.8 10*3/uL (ref 1.4–7.0)
Neutrophils: 54 %
Platelets: 333 10*3/uL (ref 150–379)
RBC: 4.05 x10E6/uL (ref 3.77–5.28)
RDW: 15.4 % (ref 12.3–15.4)
WBC: 7.2 10*3/uL (ref 3.4–10.8)

## 2017-02-14 LAB — LIPID PANEL
CHOL/HDL RATIO: 2.6 ratio (ref 0.0–4.4)
CHOLESTEROL TOTAL: 198 mg/dL (ref 100–199)
HDL: 77 mg/dL (ref >39)
LDL CALC: 109 mg/dL — AB (ref 0–99)
Triglycerides: 62 mg/dL (ref 0–149)
VLDL Cholesterol Cal: 12 mg/dL (ref 5–40)

## 2017-02-14 LAB — COMPREHENSIVE METABOLIC PANEL
A/G RATIO: 1.5 (ref 1.2–2.2)
ALBUMIN: 4.4 g/dL (ref 3.5–4.8)
ALT: 14 IU/L (ref 0–32)
AST: 17 IU/L (ref 0–40)
Alkaline Phosphatase: 141 IU/L — ABNORMAL HIGH (ref 39–117)
BILIRUBIN TOTAL: 0.3 mg/dL (ref 0.0–1.2)
BUN / CREAT RATIO: 24 (ref 12–28)
BUN: 23 mg/dL (ref 8–27)
CHLORIDE: 103 mmol/L (ref 96–106)
CO2: 22 mmol/L (ref 20–29)
Calcium: 9.7 mg/dL (ref 8.7–10.3)
Creatinine, Ser: 0.96 mg/dL (ref 0.57–1.00)
GFR calc non Af Amer: 60 mL/min/{1.73_m2} (ref >59)
GFR, EST AFRICAN AMERICAN: 69 mL/min/{1.73_m2} (ref >59)
GLOBULIN, TOTAL: 3 g/dL (ref 1.5–4.5)
Glucose: 93 mg/dL (ref 65–99)
POTASSIUM: 4.2 mmol/L (ref 3.5–5.2)
SODIUM: 142 mmol/L (ref 134–144)
Total Protein: 7.4 g/dL (ref 6.0–8.5)

## 2017-02-14 LAB — TSH: TSH: 1.07 u[IU]/mL (ref 0.450–4.500)

## 2017-02-21 ENCOUNTER — Ambulatory Visit
Admission: RE | Admit: 2017-02-21 | Discharge: 2017-02-21 | Disposition: A | Discharge status: Home / Self Care | Payer: Medicare HMO | Source: Ambulatory Visit | Attending: Family Medicine | Admitting: Family Medicine

## 2017-02-21 DIAGNOSIS — Z780 Asymptomatic menopausal state: Secondary | ICD-10-CM | POA: Diagnosis not present

## 2017-02-21 DIAGNOSIS — M8589 Other specified disorders of bone density and structure, multiple sites: Secondary | ICD-10-CM | POA: Insufficient documentation

## 2017-02-22 ENCOUNTER — Telehealth: Payer: Self-pay

## 2017-02-22 MED ORDER — ALENDRONATE SODIUM 70 MG PO TABS: 70.0000 mg | ORAL_TABLET | ORAL | 3 refills | Status: DC

## 2017-02-22 NOTE — Telephone Encounter (Signed)
Patient advised. RX sent to [facility] pharmacy.  

## 2017-02-22 NOTE — Telephone Encounter (Signed)
-----   Message from Margo Common, [place] sent at 02/22/2017  8:20 AM EST ----- Rheumatologist diagnosed osteopenia 12-23-13 and has been on calcium supplements. This bone density test shows progression to osteoporosis and needs Vitamin-D 1000 IU qd with Alendronate (Fosamax) 70 mg one tablet a week #12 & 3 RF. Recheck bone density in 2 years.

## 2017-02-26 DIAGNOSIS — I10 Essential (primary) hypertension: Secondary | ICD-10-CM | POA: Diagnosis not present

## 2017-02-26 DIAGNOSIS — M5136 Other intervertebral disc degeneration, lumbar region: Secondary | ICD-10-CM | POA: Diagnosis not present

## 2017-02-26 DIAGNOSIS — M25551 Pain in right hip: Secondary | ICD-10-CM | POA: Diagnosis not present

## 2017-02-26 DIAGNOSIS — Z6826 Body mass index (BMI) 26.0-26.9, adult: Secondary | ICD-10-CM | POA: Diagnosis not present

## 2017-03-06 DIAGNOSIS — I10 Essential (primary) hypertension: Secondary | ICD-10-CM | POA: Diagnosis not present

## 2017-03-06 DIAGNOSIS — E782 Mixed hyperlipidemia: Secondary | ICD-10-CM | POA: Diagnosis not present

## 2017-03-13 ENCOUNTER — Other Ambulatory Visit: Payer: Self-pay | Admitting: Neurosurgery

## 2017-03-13 DIAGNOSIS — M48062 Spinal stenosis, lumbar region with neurogenic claudication: Secondary | ICD-10-CM

## 2017-03-20 ENCOUNTER — Ambulatory Visit
Admission: RE | Admit: 2017-03-20 | Discharge: 2017-03-20 | Disposition: A | Discharge status: Home / Self Care | Payer: Medicare HMO | Source: Ambulatory Visit | Attending: Neurosurgery | Admitting: Neurosurgery

## 2017-03-20 DIAGNOSIS — M48062 Spinal stenosis, lumbar region with neurogenic claudication: Secondary | ICD-10-CM

## 2017-03-20 DIAGNOSIS — M4327 Fusion of spine, lumbosacral region: Secondary | ICD-10-CM | POA: Diagnosis not present

## 2017-03-20 MED ORDER — GADOBENATE DIMEGLUMINE 529 MG/ML IV SOLN
13.0000 mL | Freq: Once | INTRAVENOUS | Status: AC | PRN
  Administered 2017-03-20: 13 mL via INTRAVENOUS

## 2017-03-27 ENCOUNTER — Inpatient Hospital Stay: Admission: RE | Admit: 2017-03-27 | Payer: Medicare HMO | Source: Ambulatory Visit

## 2017-03-28 DIAGNOSIS — M8448XA Pathological fracture, other site, initial encounter for fracture: Secondary | ICD-10-CM | POA: Diagnosis not present

## 2017-03-29 ENCOUNTER — Ambulatory Visit: Payer: Medicare HMO | Admitting: Anesthesiology

## 2017-03-29 ENCOUNTER — Ambulatory Visit: Payer: Medicare HMO

## 2017-03-29 ENCOUNTER — Ambulatory Visit
Admission: RE | Admit: 2017-03-29 | Discharge: 2017-03-29 | Disposition: A | Discharge status: Home / Self Care | Payer: Medicare HMO | Source: Ambulatory Visit | Attending: Orthopedic Surgery | Admitting: Orthopedic Surgery

## 2017-03-29 ENCOUNTER — Encounter
Admission: RE | Disposition: A | Discharge status: Home / Self Care | Payer: Self-pay | Source: Ambulatory Visit | Attending: Orthopedic Surgery

## 2017-03-29 DIAGNOSIS — M8448XA Pathological fracture, other site, initial encounter for fracture: Secondary | ICD-10-CM | POA: Insufficient documentation

## 2017-03-29 DIAGNOSIS — Z888 Allergy status to other drugs, medicaments and biological substances status: Secondary | ICD-10-CM | POA: Insufficient documentation

## 2017-03-29 DIAGNOSIS — Z8052 Family history of malignant neoplasm of bladder: Secondary | ICD-10-CM | POA: Insufficient documentation

## 2017-03-29 DIAGNOSIS — M48061 Spinal stenosis, lumbar region without neurogenic claudication: Secondary | ICD-10-CM | POA: Diagnosis not present

## 2017-03-29 DIAGNOSIS — Z803 Family history of malignant neoplasm of breast: Secondary | ICD-10-CM | POA: Insufficient documentation

## 2017-03-29 DIAGNOSIS — Z96652 Presence of left artificial knee joint: Secondary | ICD-10-CM | POA: Insufficient documentation

## 2017-03-29 DIAGNOSIS — Z809 Family history of malignant neoplasm, unspecified: Secondary | ICD-10-CM | POA: Insufficient documentation

## 2017-03-29 DIAGNOSIS — I10 Essential (primary) hypertension: Secondary | ICD-10-CM | POA: Diagnosis not present

## 2017-03-29 DIAGNOSIS — M1120 Other chondrocalcinosis, unspecified site: Secondary | ICD-10-CM | POA: Diagnosis not present

## 2017-03-29 DIAGNOSIS — M4326 Fusion of spine, lumbar region: Secondary | ICD-10-CM | POA: Diagnosis not present

## 2017-03-29 DIAGNOSIS — E785 Hyperlipidemia, unspecified: Secondary | ICD-10-CM | POA: Diagnosis not present

## 2017-03-29 DIAGNOSIS — Z887 Allergy status to serum and vaccine status: Secondary | ICD-10-CM | POA: Diagnosis not present

## 2017-03-29 DIAGNOSIS — Z90710 Acquired absence of both cervix and uterus: Secondary | ICD-10-CM | POA: Diagnosis not present

## 2017-03-29 DIAGNOSIS — Z7951 Long term (current) use of inhaled steroids: Secondary | ICD-10-CM | POA: Insufficient documentation

## 2017-03-29 DIAGNOSIS — Z7982 Long term (current) use of aspirin: Secondary | ICD-10-CM | POA: Diagnosis not present

## 2017-03-29 DIAGNOSIS — Z419 Encounter for procedure for purposes other than remedying health state, unspecified: Secondary | ICD-10-CM

## 2017-03-29 DIAGNOSIS — Z79899 Other long term (current) drug therapy: Secondary | ICD-10-CM | POA: Diagnosis not present

## 2017-03-29 DIAGNOSIS — Z833 Family history of diabetes mellitus: Secondary | ICD-10-CM | POA: Diagnosis not present

## 2017-03-29 DIAGNOSIS — Z8379 Family history of other diseases of the digestive system: Secondary | ICD-10-CM | POA: Insufficient documentation

## 2017-03-29 DIAGNOSIS — M069 Rheumatoid arthritis, unspecified: Secondary | ICD-10-CM | POA: Diagnosis not present

## 2017-03-29 DIAGNOSIS — Z8249 Family history of ischemic heart disease and other diseases of the circulatory system: Secondary | ICD-10-CM | POA: Diagnosis not present

## 2017-03-29 DIAGNOSIS — M545 Low back pain: Secondary | ICD-10-CM | POA: Diagnosis not present

## 2017-03-29 DIAGNOSIS — M533 Sacrococcygeal disorders, not elsewhere classified: Secondary | ICD-10-CM | POA: Diagnosis not present

## 2017-03-29 DIAGNOSIS — R011 Cardiac murmur, unspecified: Secondary | ICD-10-CM | POA: Diagnosis not present

## 2017-03-29 DIAGNOSIS — J45909 Unspecified asthma, uncomplicated: Secondary | ICD-10-CM | POA: Insufficient documentation

## 2017-03-29 HISTORY — PX: SACROPLASTY: SHX6797

## 2017-03-29 SURGERY — SACROPLASTY
Anesthesia: General

## 2017-03-29 MED ORDER — FENTANYL CITRATE (PF) 100 MCG/2ML IJ SOLN
INTRAMUSCULAR | Status: AC
  Filled 2017-03-29: qty 2

## 2017-03-29 MED ORDER — IOPAMIDOL (ISOVUE-M 200) INJECTION 41%
INTRAMUSCULAR | Status: AC
  Filled 2017-03-29: qty 20

## 2017-03-29 MED ORDER — ACETAMINOPHEN 10 MG/ML IV SOLN
INTRAVENOUS | Status: DC | PRN
  Administered 2017-03-29: 1000 mg via INTRAVENOUS

## 2017-03-29 MED ORDER — FENTANYL CITRATE (PF) 100 MCG/2ML IJ SOLN: 25.0000 ug | INTRAMUSCULAR | Status: DC | PRN

## 2017-03-29 MED ORDER — GLYCOPYRROLATE 0.2 MG/ML IJ SOLN
INTRAMUSCULAR | Status: DC | PRN
  Administered 2017-03-29: 0.1 mg via INTRAVENOUS

## 2017-03-29 MED ORDER — DIPHENHYDRAMINE HCL 50 MG/ML IJ SOLN
INTRAMUSCULAR | Status: DC | PRN
  Administered 2017-03-29: 12.5 mg via INTRAVENOUS

## 2017-03-29 MED ORDER — DEXAMETHASONE SODIUM PHOSPHATE 10 MG/ML IJ SOLN
INTRAMUSCULAR | Status: AC
  Filled 2017-03-29: qty 1

## 2017-03-29 MED ORDER — PROPOFOL 10 MG/ML IV BOLUS
INTRAVENOUS | Status: DC | PRN
  Administered 2017-03-29: 30 mg via INTRAVENOUS
  Administered 2017-03-29 (×2): 50 mg via INTRAVENOUS
  Administered 2017-03-29: 20 mg via INTRAVENOUS

## 2017-03-29 MED ORDER — MEPERIDINE HCL 50 MG/ML IJ SOLN: 6.2500 mg | INTRAMUSCULAR | Status: DC | PRN

## 2017-03-29 MED ORDER — FENTANYL CITRATE (PF) 100 MCG/2ML IJ SOLN
INTRAMUSCULAR | Status: DC | PRN
  Administered 2017-03-29 (×3): 50 ug via INTRAVENOUS
  Administered 2017-03-29 (×2): 25 ug via INTRAVENOUS

## 2017-03-29 MED ORDER — PROPOFOL 10 MG/ML IV BOLUS
INTRAVENOUS | Status: AC
  Filled 2017-03-29: qty 20

## 2017-03-29 MED ORDER — METOCLOPRAMIDE HCL 10 MG PO TABS: 5.0000 mg | ORAL_TABLET | Freq: Three times a day (TID) | ORAL | Status: DC | PRN

## 2017-03-29 MED ORDER — ONDANSETRON HCL 4 MG/2ML IJ SOLN
INTRAMUSCULAR | Status: AC
  Filled 2017-03-29: qty 2

## 2017-03-29 MED ORDER — SODIUM CHLORIDE 0.9 % IV SOLN: INTRAVENOUS | Status: DC

## 2017-03-29 MED ORDER — OXYCODONE HCL 5 MG PO TABS: 5.0000 mg | ORAL_TABLET | Freq: Once | ORAL | Status: DC | PRN

## 2017-03-29 MED ORDER — LIDOCAINE HCL (PF) 2 % IJ SOLN
INTRAMUSCULAR | Status: AC
  Filled 2017-03-29: qty 10

## 2017-03-29 MED ORDER — ONDANSETRON HCL 4 MG/2ML IJ SOLN
INTRAMUSCULAR | Status: DC | PRN
  Administered 2017-03-29: 4 mg via INTRAVENOUS

## 2017-03-29 MED ORDER — FAMOTIDINE 20 MG PO TABS: 20.0000 mg | ORAL_TABLET | Freq: Once | ORAL | Status: DC

## 2017-03-29 MED ORDER — CEFAZOLIN SODIUM-DEXTROSE 2-3 GM-%(50ML) IV SOLR
INTRAVENOUS | Status: DC | PRN
  Administered 2017-03-29: 2 g via INTRAVENOUS

## 2017-03-29 MED ORDER — OXYCODONE HCL 5 MG/5ML PO SOLN: 5.0000 mg | Freq: Once | ORAL | Status: DC | PRN

## 2017-03-29 MED ORDER — ACETAMINOPHEN 10 MG/ML IV SOLN
INTRAVENOUS | Status: AC
  Filled 2017-03-29: qty 100

## 2017-03-29 MED ORDER — DEXAMETHASONE SODIUM PHOSPHATE 10 MG/ML IJ SOLN
INTRAMUSCULAR | Status: DC | PRN
  Administered 2017-03-29: 10 mg via INTRAVENOUS

## 2017-03-29 MED ORDER — DIPHENHYDRAMINE HCL 50 MG/ML IJ SOLN
INTRAMUSCULAR | Status: AC
  Filled 2017-03-29: qty 1

## 2017-03-29 MED ORDER — BUPIVACAINE-EPINEPHRINE (PF) 0.5% -1:200000 IJ SOLN
INTRAMUSCULAR | Status: AC
  Filled 2017-03-29: qty 30

## 2017-03-29 MED ORDER — GLYCOPYRROLATE 0.2 MG/ML IJ SOLN
INTRAMUSCULAR | Status: AC
  Filled 2017-03-29: qty 1

## 2017-03-29 MED ORDER — BUPIVACAINE-EPINEPHRINE (PF) 0.5% -1:200000 IJ SOLN
INTRAMUSCULAR | Status: DC | PRN
  Administered 2017-03-29: 20 mL

## 2017-03-29 MED ORDER — LACTATED RINGERS IV SOLN: INTRAVENOUS | Status: DC

## 2017-03-29 MED ORDER — HYDROCODONE-ACETAMINOPHEN 5-325 MG PO TABS: 1.0000 | ORAL_TABLET | ORAL | Status: DC | PRN

## 2017-03-29 MED ORDER — LACTATED RINGERS IV SOLN
INTRAVENOUS | Status: DC | PRN
  Administered 2017-03-29: 12:00:00 via INTRAVENOUS

## 2017-03-29 MED ORDER — LIDOCAINE HCL 1 % IJ SOLN
INTRAMUSCULAR | Status: DC | PRN
Start: 2017-03-29 — End: 2017-03-29
  Administered 2017-03-29: 30 mL

## 2017-03-29 MED ORDER — METOCLOPRAMIDE HCL 5 MG/ML IJ SOLN: 5.0000 mg | Freq: Three times a day (TID) | INTRAMUSCULAR | Status: DC | PRN

## 2017-03-29 MED ORDER — ONDANSETRON HCL 4 MG PO TABS: 4.0000 mg | ORAL_TABLET | Freq: Four times a day (QID) | ORAL | Status: DC | PRN

## 2017-03-29 MED ORDER — ONDANSETRON HCL 4 MG/2ML IJ SOLN: 4.0000 mg | Freq: Four times a day (QID) | INTRAMUSCULAR | Status: DC | PRN

## 2017-03-29 MED ORDER — CEFAZOLIN SODIUM 1 G IJ SOLR
INTRAMUSCULAR | Status: AC
  Filled 2017-03-29: qty 20

## 2017-03-29 MED ORDER — LIDOCAINE HCL (PF) 1 % IJ SOLN
INTRAMUSCULAR | Status: AC
  Filled 2017-03-29: qty 30

## 2017-03-29 MED ORDER — LIDOCAINE HCL (CARDIAC) 20 MG/ML IV SOLN
INTRAVENOUS | Status: DC | PRN
  Administered 2017-03-29: 40 mg via INTRATRACHEAL

## 2017-03-29 SURGICAL SUPPLY — 19 items
BIT DRILL KYPHON EXPRESS (MISCELLANEOUS) ×2 IMPLANT
CEMENT KYPHON CX01A KIT/MIXER (Cement) ×2 IMPLANT
DERMABOND ADVANCED (GAUZE/BANDAGES/DRESSINGS) ×1
DERMABOND ADVANCED .7 DNX12 (GAUZE/BANDAGES/DRESSINGS) ×1 IMPLANT
DEVICE BIOPSY BONE KYPH (INSTRUMENTS) ×8 IMPLANT
DEVICE BIOPSY BONE KYPHX (INSTRUMENTS) ×2 IMPLANT
DRAPE C-ARM XRAY 36X54 (DRAPES) ×2 IMPLANT
DRILL KYPHON EXPRESS (MISCELLANEOUS) ×4
DURAPREP 26ML APPLICATOR (WOUND CARE) ×2 IMPLANT
GLOVE SURG SYN 9.0  PF PI (GLOVE) ×1
GLOVE SURG SYN 9.0 PF PI (GLOVE) ×1 IMPLANT
GOWN SRG 2XL LVL 4 RGLN SLV (GOWNS) ×1 IMPLANT
GOWN STRL NON-REIN 2XL LVL4 (GOWNS) ×1
GOWN STRL REUS W/ TWL LRG LVL3 (GOWN DISPOSABLE) ×1 IMPLANT
GOWN STRL REUS W/TWL LRG LVL3 (GOWN DISPOSABLE) ×1
PACK KYPHOPLASTY (MISCELLANEOUS) ×2 IMPLANT
STRAP SAFETY 5IN WIDE (MISCELLANEOUS) ×2 IMPLANT
TRAY KYPHOPAK 15/3 EXPRESS 1ST (MISCELLANEOUS) IMPLANT
TRAY KYPHOPAK 20/3 EXPRESS 1ST (MISCELLANEOUS) IMPLANT

## 2017-03-29 NOTE — Anesthesia Preprocedure Evaluation (Signed)
Anesthesia Evaluation  Patient identified by MRN, date of birth, ID band Patient awake    Reviewed: Allergy & Precautions, NPO status , Patient's Chart, lab work & pertinent test results  History of Anesthesia Complications Negative for: history of anesthetic complications  Airway Mallampati: II  TM Distance: >3 FB Neck ROM: Full    Dental no notable dental hx.    Pulmonary asthma , neg sleep apnea, neg COPD, former smoker,    breath sounds clear to auscultation- rhonchi (-) wheezing      Cardiovascular Exercise Tolerance: Good hypertension, Pt. on medications (-) CAD, (-) Past MI, (-) Cardiac Stents and (-) CABG  Rhythm:Regular Rate:Normal - Systolic murmurs and - Diastolic murmurs    Neuro/Psych negative psych ROS   GI/Hepatic negative GI ROS, Neg liver ROS,   Endo/Other  negative endocrine ROSneg diabetes  Renal/GU Renal InsufficiencyRenal disease     Musculoskeletal  (+) Arthritis ,   Abdominal (+) - obese,   Peds  Hematology negative hematology ROS (+)   Anesthesia Other Findings Past Medical History: No date: Arthritis No date: Heart murmur     Comment:  as a child some dr say they hear it some say they don't No date: HLD (hyperlipidemia) No date: Hypertension   Reproductive/Obstetrics                             Anesthesia Physical Anesthesia Plan  ASA: II  Anesthesia Plan: General   Post-op Pain Management:    Induction: Intravenous  PONV Risk Score and Plan: 2 and Propofol infusion and Ondansetron  Airway Management Planned: Natural Airway  Additional Equipment:   Intra-op Plan:   Post-operative Plan:   Informed Consent: I have reviewed the patients History and Physical, chart, labs and discussed the procedure including the risks, benefits and alternatives for the proposed anesthesia with the patient or authorized representative who has indicated his/her  understanding and acceptance.   Dental advisory given  Plan Discussed with: CRNA and Anesthesiologist  Anesthesia Plan Comments:         Anesthesia Quick Evaluation

## 2017-03-29 NOTE — Op Note (Signed)
03/29/2017  1:48 PM  PATIENT:  Molly Mitchell  72 y.o. female  PRE-OPERATIVE DIAGNOSIS:  sacral fracture insufficiency  POST-OPERATIVE DIAGNOSIS:  SACRAL BACK PAIN insufficiency fracture  PROCEDURE:  Procedure(s): SACROPLASTY-S1 (N/A)  SURGEON: Laurene Footman, MD  ASSISTANTS: None  ANESTHESIA:   local and MAC  EBL:  No intake/output data recorded.  BLOOD ADMINISTERED:none  DRAINS: none   LOCAL MEDICATIONS USED:  MARCAINE    and XYLOCAINE   SPECIMEN:  No Specimen  DISPOSITION OF SPECIMEN:  N/A  COUNTS:  YES  TOURNIQUET:  * No tourniquets in log *  IMPLANTS: Cement  DICTATION: .Dragon Dictation  patient was brought to the operating room and after adequate sedation was given the patient was placed prone.  c-arm was brought in in good visualization of the sacrum was obtained in both AP and lateral projections.  After patient identification and timeout procedures were completed 5 cc of 1% Xylocaine was infiltrated on both sides of the buttocks in the area of the planned incision.  The back was then prepped and draped in sterile fashion and repeat timeout procedure carried out.  Spinal needle was used to get local down to the sacrum and the projected plane for the long axis technique.  After this was done local anesthetic was infiltrated and the express kit utilized with the trocar being advanced along on the lateral view of the sacrum towards the gap between the SI joint and the sacral foramen.  When this is appropriate level at the lower end of the SI joint with the edema having been mostly in the S1 and 2 levels bone cement was mixed and 2 cc filled in both sides without extravasation into the SI joint or the sacral foramen.  When the cement had set the trocar was removed and permanent C-arm views were obtained the wounds were closed with Dermabond followed by Band-Aids  PLAN OF CARE: Discharge to home after PACU  PATIENT DISPOSITION:  PACU - hemodynamically stable.

## 2017-03-29 NOTE — Anesthesia Postprocedure Evaluation (Signed)
Anesthesia Post Note  Patient: Molly Mitchell  Procedure(s) Performed: Dillard Cannon (N/A )  Patient location during evaluation: PACU Anesthesia Type: General Level of consciousness: awake and alert and oriented Pain management: pain level controlled Vital Signs Assessment: post-procedure vital signs reviewed and stable Respiratory status: spontaneous breathing, nonlabored ventilation and respiratory function stable Cardiovascular status: blood pressure returned to baseline and stable Postop Assessment: no signs of nausea or vomiting Anesthetic complications: no     Last Vitals:  Vitals:   03/29/17 1431 03/29/17 1443  BP: (!) 152/61 (!) 153/62  Pulse: 74 62  Resp: 14 14  Temp: 36.6 C   SpO2: 98% 100%    Last Pain:  Vitals:   03/29/17 1431  TempSrc: Tympanic  PainSc:                  [name]

## 2017-03-29 NOTE — H&P (Signed)
Reviewed paper H+P, will be scanned into chart. No changes noted.  

## 2017-03-29 NOTE — Discharge Instructions (Addendum)
AMBULATORY SURGERY  DISCHARGE INSTRUCTIONS   1) The drugs that you were given will stay in your system until tomorrow so for the next 24 hours you should not:  A) Drive an automobile B) Make any legal decisions C) Drink any alcoholic beverage   2) You may resume regular meals tomorrow.  Today it is better to start with liquids and gradually work up to solid foods.  You may eat anything you prefer, but it is better to start with liquids, then soup and crackers, and gradually work up to solid foods.   3) Please notify your doctor immediately if you have any unusual bleeding, trouble breathing, redness and pain at the surgery site, drainage, fever, or pain not relieved by medication. 4)   5) Your post-operative visit with Dr.                                     is: Date:                        Time:    Please call to schedule your post-operative visit.  6) Additional Instructions:      Take it easy today and tomorrow, resume her normal activities as tolerated on Saturday.  Remove Band-Aids and Saturday okay to shower but no bath for 2 weeks.  May leave Band-Aids over incisions if desired.  Call office tomorrow morning if having problems [phone]

## 2017-03-29 NOTE — Transfer of Care (Signed)
Immediate Anesthesia Transfer of Care Note  Patient: Molly Mitchell  Procedure(s) Performed: Dillard Cannon (N/A )  Patient Location: PACU  Anesthesia Type:General  Level of Consciousness: drowsy and patient cooperative  Airway & Oxygen Therapy: Patient Spontanous Breathing and Patient connected to nasal cannula oxygen  Post-op Assessment: Report given to RN, Post -op Vital signs reviewed and stable and Patient moving all extremities X 4  Post vital signs: Reviewed and stable  Last Vitals:  Vitals:   03/29/17 1110 03/29/17 1353  BP: (!) 147/68 (!) 144/69  Pulse: 60 62  Resp: 17 10  Temp: 36.6 C (!) 36.3 C  SpO2: 100% 98%    Last Pain:  Vitals:   03/29/17 1110  TempSrc: Oral  PainSc: 3          Complications: No apparent anesthesia complications

## 2017-03-29 NOTE — Anesthesia Post-op Follow-up Note (Signed)
Anesthesia QCDR form completed.        

## 2017-04-13 DIAGNOSIS — M8448XA Pathological fracture, other site, initial encounter for fracture: Secondary | ICD-10-CM | POA: Diagnosis not present

## 2017-05-11 DIAGNOSIS — M7061 Trochanteric bursitis, right hip: Secondary | ICD-10-CM | POA: Diagnosis not present

## 2017-05-11 DIAGNOSIS — M8448XA Pathological fracture, other site, initial encounter for fracture: Secondary | ICD-10-CM | POA: Diagnosis not present

## 2017-05-15 DIAGNOSIS — I10 Essential (primary) hypertension: Secondary | ICD-10-CM | POA: Diagnosis not present

## 2017-05-15 DIAGNOSIS — S3210XA Unspecified fracture of sacrum, initial encounter for closed fracture: Secondary | ICD-10-CM | POA: Insufficient documentation

## 2017-05-15 DIAGNOSIS — M8448XG Pathological fracture, other site, subsequent encounter for fracture with delayed healing: Secondary | ICD-10-CM | POA: Diagnosis not present

## 2017-05-15 DIAGNOSIS — M25551 Pain in right hip: Secondary | ICD-10-CM | POA: Diagnosis not present

## 2017-05-15 DIAGNOSIS — M4316 Spondylolisthesis, lumbar region: Secondary | ICD-10-CM | POA: Diagnosis not present

## 2017-06-28 ENCOUNTER — Ambulatory Visit: Payer: Self-pay | Admitting: Family Medicine

## 2017-06-29 ENCOUNTER — Ambulatory Visit (INDEPENDENT_AMBULATORY_CARE_PROVIDER_SITE_OTHER): Payer: Medicare HMO | Admitting: Family Medicine

## 2017-06-29 ENCOUNTER — Encounter: Payer: Self-pay | Admitting: Family Medicine

## 2017-06-29 VITALS — BP 140/78 | HR 67 | Temp 98.2°F | Wt 149.2 lb

## 2017-06-29 DIAGNOSIS — M961 Postlaminectomy syndrome, not elsewhere classified: Secondary | ICD-10-CM | POA: Diagnosis not present

## 2017-06-29 DIAGNOSIS — M8448XD Pathological fracture, other site, subsequent encounter for fracture with routine healing: Secondary | ICD-10-CM

## 2017-06-29 DIAGNOSIS — M6281 Muscle weakness (generalized): Secondary | ICD-10-CM

## 2017-06-29 DIAGNOSIS — Z96652 Presence of left artificial knee joint: Secondary | ICD-10-CM | POA: Diagnosis not present

## 2017-06-29 DIAGNOSIS — M8589 Other specified disorders of bone density and structure, multiple sites: Secondary | ICD-10-CM | POA: Diagnosis not present

## 2017-06-29 NOTE — Progress Notes (Signed)
Patient: Molly Mitchell Female    DOB: 05/19/1945   72 y.o.   MRN: [id number] Visit Date: 06/29/2017  Today's Provider: [name], PA   Chief Complaint  Patient presents with  . Extremity Weakness   Subjective:    HPI  Upper and Lower Extremity  Weakness:   Incident onset: lower extremity weakness started the past couple months. Upper extremity weakness started approximately 2 years ago. There was no injury mechanism. The pain is present in the left leg and right leg. Pain severity now: feels sensation of pins and needles. The pain has been intermittent since onset. Associated symptoms include muscle weakness. Associated symptoms: Cold intolerance. She reports no foreign bodies present. She is unable to raise arms above head to reach for things because she feels like she will fall.      Past Medical History:  Diagnosis Date  . Arthritis   . Heart murmur    as a child some dr say they hear it some say they don't  . HLD (hyperlipidemia)   . Hypertension    Patient Active Problem List   Diagnosis Date Noted  . CKD (chronic kidney disease) stage 3, GFR 30-59 ml/min (HCC) 02/01/2017  . Hypokalemia 02/01/2017  . Spondylolisthesis of lumbar region 12/18/2016  . S/P total knee arthroplasty 07/31/2016  . Bradycardia 12/20/2015  . Encounter for Medicare annual wellness exam 07/27/2015  . Acute bronchitis 05/13/2014  . Dysfunction of eustachian tube 05/13/2014  . Cardiac conduction disorder 05/13/2014  . Ache in joint 05/13/2014  . AB (asthmatic bronchitis) 05/13/2014  . Gonalgia 05/13/2014  . Asthma, cough variant 05/13/2014  . Screening for depression 05/13/2014  . Electrolyte imbalance 05/13/2014  . Fatigue 05/13/2014  . H/O total knee replacement 05/13/2014  . HLD (hyperlipidemia) 05/13/2014  . Elevated WBC count 05/13/2014  . Drug withdrawal syndrome to opium ([place]) 05/13/2014  . Arthritis of knee, degenerative 05/13/2014  . Osteopenia 05/13/2014  . Abdominal pain,  right upper quadrant 05/13/2014  . Herpes zona 05/13/2014  . Periapical abscess without sinus tract 05/13/2014  . Pain in thoracic spine 05/13/2014  . MI (mitral incompetence) 12/29/2013  . Idiopathic insomnia 10/16/2013  . Polypharmacy 10/16/2013  . Dizziness 07/29/2013  . Long term current use of systemic steroids 07/21/2013  . History of prolonged Q-T interval on ECG 05/22/2013  . Benign essential HTN 05/22/2013  . Personal history of other diseases of the circulatory system 05/22/2013  . 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 Combined fat and carbohydrate induced hyperlipemia 03/31/2013  . S/P lumbar spine operation 06/20/2012  . Cervical post-laminectomy syndrome 06/20/2012  . Status post lumbar spine operation 06/20/2012  . Tumoral calcinosis 10/31/2011  . Connective tissue disease, undifferentiated ([place]) 10/31/2011  . Effusion of knee 10/31/2011  . Chondrocalcinosis due to dicalcium phosphate crystals 10/31/2011  . Undifferentiated connective tissue disease ([place]) 10/31/2011  . Chronic cervical pain 03/28/2011  . Arthralgia of ankle or foot 11/30/2010  . Neoplasm of breast 10/26/2008  . Acne 07/24/2008  . Dizziness and giddiness 07/24/2008  . Cervical pain 05/12/2008  . Dyssomnia 05/12/2008  . Essential (primary) hypertension 05/12/2008   Past Surgical History:  Procedure Laterality Date  . ABDOMINAL HYSTERECTOMY  1981  . CORRECTION HAMMER TOE    . JOINT REPLACEMENT Left 03/03/2013  . ROTATOR CUFF REPAIR Right 1981  . SPINAL FUSION  2001   lower back   . SPINAL FUSION  2003   neck  . TOTAL KNEE REVISION  Left 07/31/2016   Procedure: TOTAL KNEE REVISION;  Surgeon: Dereck Leep, MD;  Location: [facility] ORS;  Service: Orthopedics;  Laterality: Left;   Family History  Problem Relation Age of Onset  . Hypertension Brother   . Hypertension Mother   . Hypertension Sister   . Cancer Sister   . Diabetes Paternal Grandmother   . Heart failure  Maternal Grandmother    Allergies  Allergen Reactions  . Amitriptyline Hcl Swelling and Other (See Comments)    Facial swelling  . Duricef [Cefadroxil] Swelling and Other (See Comments)    Facial swelling  . Levofloxacin Other (See Comments)    Severe Shoulder Pain  . Tetanus Toxoid Swelling and Other (See Comments)    Arm swelling  . Erythromycin Rash and Other (See Comments)    Rash on bottom    Current Outpatient Medications:  .  alendronate (FOSAMAX) 70 MG tablet, Take 1 tablet (70 mg total) by mouth every 7 (seven) days. Take with a full glass of water on an empty stomach., Disp: 12 tablet, Rfl: 3 .  Ascorbic Acid (VITAMIN C) 1000 MG tablet, Take 1,000 mg by mouth daily. , Disp: , Rfl:  .  atenolol (TENORMIN) 50 MG tablet, Take 50 mg by mouth daily. , Disp: , Rfl:  .  Calcium-Magnesium 500-250 MG TABS, Take 2 tablets by mouth daily., Disp: , Rfl:  .  Cholecalciferol (VITAMIN D3) 1000 UNITS CAPS, Take 1,000 Units by mouth daily. , Disp: , Rfl:  .  meloxicam (MOBIC) 15 MG tablet, Take 15 mg by mouth daily. , Disp: , Rfl:  .  Multiple Vitamins-Minerals (MULTIVITAMIN WITH MINERALS) tablet, Take 3 tablets by mouth daily., Disp: , Rfl:  .  naproxen sodium (ALEVE) 220 MG tablet, Take 440 mg by mouth 2 (two) times daily with a meal., Disp: , Rfl:  .  pravastatin (PRAVACHOL) 40 MG tablet, Take 40 mg by mouth at bedtime., Disp: , Rfl:  .  vitamin E 400 UNIT capsule, Take 400 Units by mouth daily. , Disp: , Rfl:  .  albuterol (PROVENTIL HFA;VENTOLIN HFA) 108 (90 Base) MCG/ACT inhaler, Inhale 2 puffs into the lungs every 6 (six) hours as needed for wheezing or shortness of breath. (Patient not taking: Reported on 03/29/2017), Disp: 1 Inhaler, Rfl: 2  Review of Systems  Constitutional: Negative.   HENT: Negative.   Respiratory: Negative.   Cardiovascular: Negative.   Genitourinary: Negative.   Neurological: Positive for weakness (leg).   Social History   Tobacco Use  . Smoking status:  Former Smoker    Last attempt to quit: 04/17/1979    Years since quitting: 38.2  . Smokeless tobacco: Never Used  Substance Use Topics  . Alcohol use: No    Alcohol/week: 0.0 oz   Objective:   BP 140/78 (BP Location: Right Arm, Patient Position: Sitting, Cuff Size: Normal)   Pulse 67   Temp 98.2 F (36.8 C) (Oral)   Wt 149 lb 3.2 oz (67.7 kg)   SpO2 97%   BMI 27.29 kg/m  Vitals:   06/29/17 1543  BP: 140/78  Pulse: 67  Temp: 98.2 F (36.8 C)  TempSrc: Oral  SpO2: 97%  Weight: 149 lb 3.2 oz (67.7 kg)   Wt Readings from Last 3 Encounters:  06/29/17 149 lb 3.2 oz (67.7 kg)  03/29/17 143 lb (64.9 kg)  02/13/17 144 lb 12.8 oz (65.7 kg)    Physical Exam  Constitutional: She is oriented to person, place, and time. She appears  well-developed and well-nourished. No distress.  HENT:  Head: Normocephalic and atraumatic.  Right Ear: Hearing normal.  Left Ear: Hearing normal.  Nose: Nose normal.  Eyes: Conjunctivae and lids are normal. Right eye exhibits no discharge. Left eye exhibits no discharge. No scleral icterus.  Pulmonary/Chest: Effort normal. No respiratory distress.  Musculoskeletal: She exhibits no tenderness.  Well healed scars from left knee replacement, lumbar laminectomy/fusion, sacral fracture and cervical fusion. Decreased ROM in neck and left knee. Muscle strength symmetric and 3/5 today. No tremor or myoclonia. No muscle atrophy.  Neurological: She is alert and oriented to person, place, and time.  Skin: Skin is intact. No lesion and no rash noted.  Psychiatric: She has a normal mood and affect. Her speech is normal and behavior is normal. Thought content normal.      Assessment & Plan:     1. Muscle weakness Has had muscle weakness intermittently in arms for the past 2 years. The past couple months has had progressive weakness and "shaking" in her legs. No known injury. Has a sensation of feeling as if feet are blocks and cold without change in temperature to  touch. Has had multiple back, sacrum and cervical surgeries. Concerned about possible MS. Recommend neurology referral. - Ambulatory referral to Neurology  2. Cervical post-laminectomy syndrome Cervical laminectomy in 2003 with near constant ache, tenderness and soreness in neck. Denies significant changes.   3. History of total left knee replacement Left TKA 03-03-13 with revision for loose implant on 07-31-16. Well healed with good ROM.  4. Osteopenia of multiple sites Presently on Alendronate 70 mg q week. Has been on long term prednisone with connective tissue disorder and subsequently had a sacral insufficiency fracture.  5. Sacral insufficiency fracture with routine healing Sacral plasty 03-29-17. Prior to surgery was ambulating with 2 canes and having severe 10/10 pain. Much improved now.      [name], PA  [facility]

## 2017-07-02 ENCOUNTER — Ambulatory Visit: Payer: Medicare HMO | Admitting: Family Medicine

## 2017-07-05 ENCOUNTER — Encounter: Payer: Self-pay | Admitting: Family Medicine

## 2017-07-26 DIAGNOSIS — Z96652 Presence of left artificial knee joint: Secondary | ICD-10-CM | POA: Diagnosis not present

## 2017-08-27 DIAGNOSIS — H524 Presbyopia: Secondary | ICD-10-CM | POA: Diagnosis not present

## 2017-09-04 ENCOUNTER — Other Ambulatory Visit: Payer: Self-pay | Admitting: Family Medicine

## 2017-09-06 DIAGNOSIS — E782 Mixed hyperlipidemia: Secondary | ICD-10-CM | POA: Diagnosis not present

## 2017-09-06 DIAGNOSIS — I10 Essential (primary) hypertension: Secondary | ICD-10-CM | POA: Diagnosis not present

## 2017-09-06 DIAGNOSIS — M6281 Muscle weakness (generalized): Secondary | ICD-10-CM | POA: Diagnosis not present

## 2017-09-06 DIAGNOSIS — I340 Nonrheumatic mitral (valve) insufficiency: Secondary | ICD-10-CM | POA: Diagnosis not present

## 2017-09-07 ENCOUNTER — Other Ambulatory Visit: Payer: Self-pay | Admitting: Neurosurgery

## 2017-09-07 DIAGNOSIS — M4316 Spondylolisthesis, lumbar region: Secondary | ICD-10-CM | POA: Diagnosis not present

## 2017-09-07 DIAGNOSIS — I771 Stricture of artery: Secondary | ICD-10-CM

## 2017-09-07 DIAGNOSIS — M8448XG Pathological fracture, other site, subsequent encounter for fracture with delayed healing: Secondary | ICD-10-CM | POA: Diagnosis not present

## 2017-09-07 DIAGNOSIS — M542 Cervicalgia: Secondary | ICD-10-CM

## 2017-09-07 DIAGNOSIS — I10 Essential (primary) hypertension: Secondary | ICD-10-CM | POA: Diagnosis not present

## 2017-09-07 HISTORY — DX: Stricture of artery: I77.1

## 2017-09-26 ENCOUNTER — Ambulatory Visit
Admission: RE | Admit: 2017-09-26 | Discharge: 2017-09-26 | Disposition: A | Discharge status: Home / Self Care | Payer: Medicare HMO | Source: Ambulatory Visit | Attending: Neurosurgery | Admitting: Neurosurgery

## 2017-09-26 DIAGNOSIS — M542 Cervicalgia: Secondary | ICD-10-CM | POA: Diagnosis not present

## 2017-09-26 DIAGNOSIS — M50223 Other cervical disc displacement at C6-C7 level: Secondary | ICD-10-CM | POA: Insufficient documentation

## 2017-09-26 DIAGNOSIS — Z981 Arthrodesis status: Secondary | ICD-10-CM | POA: Insufficient documentation

## 2017-09-26 DIAGNOSIS — M4802 Spinal stenosis, cervical region: Secondary | ICD-10-CM | POA: Insufficient documentation

## 2017-10-02 ENCOUNTER — Ambulatory Visit (INDEPENDENT_AMBULATORY_CARE_PROVIDER_SITE_OTHER): Payer: Medicare HMO

## 2017-10-02 VITALS — BP 148/72 | HR 58 | Temp 98.7°F | Ht 63.0 in | Wt 154.4 lb

## 2017-10-02 DIAGNOSIS — Z0000 Encounter for general adult medical examination without abnormal findings: Secondary | ICD-10-CM

## 2017-10-02 NOTE — Patient Instructions (Signed)
Molly Mitchell , Thank you for taking time to come for your Medicare Wellness Visit. I appreciate your ongoing commitment to your health goals. Please review the following plan we discussed and let me know if I can assist you in the future.   Screening recommendations/referrals: Colonoscopy: Up to date Mammogram: Pt declines order today. Bone Density: Up to date Recommended yearly ophthalmology/optometry visit for glaucoma screening and checkup Recommended yearly dental visit for hygiene and checkup  Vaccinations: Influenza vaccine: Pt declines today.  Pneumococcal vaccine: Pt declines today.  Tdap vaccine: Pt declines today.  Shingles vaccine: Pt declines today.     Advanced directives: Copy brought today to scan into chart.  Conditions/risks identified: Recommend to continue current diet plan of not eating more than 1200 calories a day and watching carbohydrate intake.   Next appointment: 10/23/17 @ 10:40 AM with Molly Mitchell.   Preventive Care 80 Years and Older, Female Preventive care refers to lifestyle choices and visits with your health care provider that can promote health and wellness. What does preventive care include?  A yearly physical exam. This is also called an annual well check.  Dental exams once or twice a year.  Routine eye exams. Ask your health care provider how often you should have your eyes checked.  Personal lifestyle choices, including:  Daily care of your teeth and gums.  Regular physical activity.  Eating a healthy diet.  Avoiding tobacco and drug use.  Limiting alcohol use.  Practicing safe sex.  Taking low-dose aspirin every day.  Taking vitamin and mineral supplements as recommended by your health care provider. What happens during an annual well check? The services and screenings done by your health care provider during your annual well check will depend on your age, overall health, lifestyle risk factors, and family history of  disease. Counseling  Your health care provider may ask you questions about your:  Alcohol use.  Tobacco use.  Drug use.  Emotional well-being.  Home and relationship well-being.  Sexual activity.  Eating habits.  History of falls.  Memory and ability to understand (cognition).  Work and work Statistician.  Reproductive health. Screening  You may have the following tests or measurements:  Height, weight, and BMI.  Blood pressure.  Lipid and cholesterol levels. These may be checked every 5 years, or more frequently if you are over 26 years old.  Skin check.  Lung cancer screening. You may have this screening every year starting at age 63 if you have a 30-pack-year history of smoking and currently smoke or have quit within the past 15 years.  Fecal occult blood test (FOBT) of the stool. You may have this test every year starting at age 84.  Flexible sigmoidoscopy or colonoscopy. You may have a sigmoidoscopy every 5 years or a colonoscopy every 10 years starting at age 39.  Hepatitis C blood test.  Hepatitis B blood test.  Sexually transmitted disease (STD) testing.  Diabetes screening. This is done by checking your blood sugar (glucose) after you have not eaten for a while (fasting). You may have this done every 1-3 years.  Bone density scan. This is done to screen for osteoporosis. You may have this done starting at age 37.  Mammogram. This may be done every 1-2 years. Talk to your health care provider about how often you should have regular mammograms. Talk with your health care provider about your test results, treatment options, and if necessary, the need for more tests. Vaccines  Your health care  provider may recommend certain vaccines, such as:  Influenza vaccine. This is recommended every year.  Tetanus, diphtheria, and acellular pertussis (Tdap, Td) vaccine. You may need a Td booster every 10 years.  Zoster vaccine. You may need this after age  8.  Pneumococcal 13-valent conjugate (PCV13) vaccine. One dose is recommended after age 63.  Pneumococcal polysaccharide (PPSV23) vaccine. One dose is recommended after age 31. Talk to your health care provider about which screenings and vaccines you need and how often you need them. This information is not intended to replace advice given to you by your health care provider. Make sure you discuss any questions you have with your health care provider. Document Released: 01/29/2015 Document Revised: 09/22/2015 Document Reviewed: 11/03/2014 Elsevier Interactive Patient Education  2017 [place] Prevention in the Home Falls can cause injuries. They can happen to people of all ages. There are many things you can do to make your home safe and to help prevent falls. What can I do on the outside of my home?  Regularly fix the edges of walkways and driveways and fix any cracks.  Remove anything that might make you trip as you walk through a door, such as a raised step or threshold.  Trim any bushes or trees on the path to your home.  Use bright outdoor lighting.  Clear any walking paths of anything that might make someone trip, such as rocks or tools.  Regularly check to see if handrails are loose or broken. Make sure that both sides of any steps have handrails.  Any raised decks and porches should have guardrails on the edges.  Have any leaves, snow, or ice cleared regularly.  Use sand or salt on walking paths during winter.  Clean up any spills in your garage right away. This includes oil or grease spills. What can I do in the bathroom?  Use night lights.  Install grab bars by the toilet and in the tub and shower. Do not use towel bars as grab bars.  Use non-skid mats or decals in the tub or shower.  If you need to sit down in the shower, use a plastic, non-slip stool.  Keep the floor dry. Clean up any water that spills on the floor as soon as it happens.  Remove  soap buildup in the tub or shower regularly.  Attach bath mats securely with double-sided non-slip rug tape.  Do not have throw rugs and other things on the floor that can make you trip. What can I do in the bedroom?  Use night lights.  Make sure that you have a light by your bed that is easy to reach.  Do not use any sheets or blankets that are too big for your bed. They should not hang down onto the floor.  Have a firm chair that has side arms. You can use this for support while you get dressed.  Do not have throw rugs and other things on the floor that can make you trip. What can I do in the kitchen?  Clean up any spills right away.  Avoid walking on wet floors.  Keep items that you use a lot in easy-to-reach places.  If you need to reach something above you, use a strong step stool that has a grab bar.  Keep electrical cords out of the way.  Do not use floor polish or wax that makes floors slippery. If you must use wax, use non-skid floor wax.  Do not have throw  rugs and other things on the floor that can make you trip. What can I do with my stairs?  Do not leave any items on the stairs.  Make sure that there are handrails on both sides of the stairs and use them. Fix handrails that are broken or loose. Make sure that handrails are as long as the stairways.  Check any carpeting to make sure that it is firmly attached to the stairs. Fix any carpet that is loose or worn.  Avoid having throw rugs at the top or bottom of the stairs. If you do have throw rugs, attach them to the floor with carpet tape.  Make sure that you have a light switch at the top of the stairs and the bottom of the stairs. If you do not have them, ask someone to add them for you. What else can I do to help prevent falls?  Wear shoes that:  Do not have high heels.  Have rubber bottoms.  Are comfortable and fit you well.  Are closed at the toe. Do not wear sandals.  If you use a  stepladder:  Make sure that it is fully opened. Do not climb a closed stepladder.  Make sure that both sides of the stepladder are locked into place.  Ask someone to hold it for you, if possible.  Clearly mark and make sure that you can see:  Any grab bars or handrails.  First and last steps.  Where the edge of each step is.  Use tools that help you move around (mobility aids) if they are needed. These include:  Canes.  Walkers.  Scooters.  Crutches.  Turn on the lights when you go into a dark area. Replace any light bulbs as soon as they burn out.  Set up your furniture so you have a clear path. Avoid moving your furniture around.  If any of your floors are uneven, fix them.  If there are any pets around you, be aware of where they are.  Review your medicines with your doctor. Some medicines can make you feel dizzy. This can increase your chance of falling. Ask your doctor what other things that you can do to help prevent falls. This information is not intended to replace advice given to you by your health care provider. Make sure you discuss any questions you have with your health care provider. Document Released: 10/29/2008 Document Revised: 06/10/2015 Document Reviewed: 02/06/2014 Elsevier Interactive Patient Education  2017 [employer].

## 2017-10-02 NOTE — Progress Notes (Addendum)
Subjective:   Molly Mitchell is a 72 y.o. female who presents for Medicare Annual (Subsequent) preventive examination.  Review of Systems:  N/A  Cardiac Risk Factors include: advanced age (>21men, >33 women);dyslipidemia;hypertension     Objective:     Vitals: BP (!) 148/72 (BP Location: Right Arm)   Pulse (!) 58   Temp 98.7 F (37.1 C) (Oral)   Ht 5\' 3"  (1.6 m)   Wt 154 lb 6.4 oz (70 kg)   BMI 27.35 kg/m   Body mass index is 27.35 kg/m.  Advanced Directives 10/02/2017 03/29/2017 12/18/2016 12/14/2016 09/22/2016 07/31/2016 07/17/2016  Does Patient Have a Medical Advance Directive? Yes Yes Yes Yes Yes No No  Type of Paramedic of [place];Living will Living will [place];Living will [place];Living will [place];Living will - -  Does patient want to make changes to medical advance directive? - No - Patient declined No - Patient declined No - Patient declined - - -  Copy of [place] in Chart? Yes No - copy requested Yes Yes No - copy requested - -  Would patient like information on creating a medical advance directive? - - - - - No - Patient declined Yes (MAU/Ambulatory/Procedural Areas - Information given)    Tobacco Social History   Tobacco Use  Smoking Status Former Smoker  . Last attempt to quit: 04/17/1979  . Years since quitting: 38.4  Smokeless Tobacco Never Used     Counseling given: Not Answered   Clinical Intake:  Pre-visit preparation completed: Yes  Pain : No/denies pain Pain Score: 0-No pain     Nutritional Status: BMI 25 -29 Overweight Nutritional Risks: None Diabetes: No  How often do you need to have someone help you when you read instructions, pamphlets, or other written materials from your doctor or pharmacy?: 1 - Never  Interpreter Needed?: No  Information entered by :: [facility], LPN  Past Medical History:  Diagnosis Date  . Arthritis   .  Heart murmur    as a child some dr say they hear it some say they don't  . HLD (hyperlipidemia)   . Hypertension    Past Surgical History:  Procedure Laterality Date  . ABDOMINAL HYSTERECTOMY  1981  . CORRECTION HAMMER TOE    . JOINT REPLACEMENT Left 03/03/2013  . MRI    . ROTATOR CUFF REPAIR Right 1981  . SPINAL FUSION  2001   lower back   . SPINAL FUSION  2003   neck  . TOTAL KNEE REVISION Left 07/31/2016   Procedure: TOTAL KNEE REVISION;  Surgeon: Dereck Leep, MD;  Location: [facility] ORS;  Service: Orthopedics;  Laterality: Left;   Family History  Problem Relation Age of Onset  . Hypertension Brother   . Hypertension Mother   . Hypertension Sister   . Cancer Sister   . Diabetes Paternal Grandmother   . Heart failure Maternal Grandmother    Social History   Socioeconomic History  . Marital status: Married    Spouse name: Not on file  . Number of children: 2  . Years of education: Not on file  . Highest education level: High school graduate  Occupational History  . Occupation: retired  Scientific laboratory technician  . Financial resource strain: Not hard at all  . Food insecurity:    Worry: Never true    Inability: Never true  . Transportation needs:    Medical: No  Non-medical: No  Tobacco Use  . Smoking status: Former Smoker    Last attempt to quit: 04/17/1979    Years since quitting: 38.4  . Smokeless tobacco: Never Used  Substance and Sexual Activity  . Alcohol use: No    Alcohol/week: 0.0 standard drinks  . Drug use: No  . Sexual activity: Not on file  Lifestyle  . Physical activity:    Days per week: Not on file    Minutes per session: Not on file  . Stress: Not at all  Relationships  . Social connections:    Talks on phone: Not on file    Gets together: Not on file    Attends religious service: Not on file    Active member of club or organization: Not on file    Attends meetings of clubs or organizations: Not on file    Relationship status: Not on file  Other  Topics Concern  . Not on file  Social History Narrative  . Not on file    Outpatient Encounter Medications as of 10/02/2017  Medication Sig  . Ascorbic Acid (VITAMIN C) 1000 MG tablet Take 1,000 mg by mouth daily.   Marland Kitchen atenolol (TENORMIN) 50 MG tablet Take 50 mg by mouth daily.   . Calcium-Magnesium 500-250 MG TABS Take 2 tablets by mouth daily.  . Cholecalciferol (VITAMIN D3) 1000 UNITS CAPS Take 1,000 Units by mouth daily.   . meloxicam (MOBIC) 15 MG tablet TAKE 1 TABLET EVERY DAY  . Multiple Vitamins-Minerals (MULTIVITAMIN WITH MINERALS) tablet Take 3 tablets by mouth daily.  . naproxen sodium (ALEVE) 220 MG tablet Take 220 mg by mouth daily as needed.   . pravastatin (PRAVACHOL) 40 MG tablet Take 40 mg by mouth at bedtime.  . vitamin E 400 UNIT capsule Take 400 Units by mouth daily.   Marland Kitchen albuterol (PROVENTIL HFA;VENTOLIN HFA) 108 (90 Base) MCG/ACT inhaler Inhale 2 puffs into the lungs every 6 (six) hours as needed for wheezing or shortness of breath. (Patient not taking: Reported on 03/29/2017)  . alendronate (FOSAMAX) 70 MG tablet Take 1 tablet (70 mg total) by mouth every 7 (seven) days. Take with a full glass of water on an empty stomach. (Patient not taking: Reported on 10/02/2017)   No facility-administered encounter medications on file as of 10/02/2017.     Activities of Daily Living In your present state of health, do you have any difficulty performing the following activities: 10/02/2017 12/18/2016  Hearing? N N  Vision? N N  Difficulty concentrating or making decisions? N N  Walking or climbing stairs? N N  Dressing or bathing? N Y  Doing errands, shopping? N N  Preparing Food and eating ? N -  Using the Toilet? N -  Do you have problems with loss of bowel control? N -  Managing your Medications? N -  Managing your Finances? N -  Housekeeping or managing your Housekeeping? N -  Some recent data might be hidden    Patient Care Team: Chrismon, Vickki Muff, PA as PCP - General  (Physician Assistant) Bryson Ha, OD as Consulting Physician (Optometry) Hooten, Laurice Record, MD as Consulting Physician (Orthopedic Surgery) Corey Skains, MD as Consulting Physician (Cardiology) Newman Pies, MD as Consulting Physician (Neurosurgery)    Assessment:   This is a routine wellness examination for Molly Mitchell.  Exercise Activities and Dietary recommendations Current Exercise Habits: Structured exercise class, Type of exercise: strength training/weights;treadmill;Other - see comments(dance class), Time (Minutes): > 60, Frequency (Times/Week): 5,  Weekly Exercise (Minutes/Week): 0, Intensity: Moderate, Exercise limited by: None identified  Goals    . DIET - REDUCE CALORIE INTAKE     Recommend to continue current diet plan of not eating more than 1200 calories a day and watching carbohydrate intake.     . Weight (lb) < 130 lb (59 kg)     Recommend cutting down carbohydrates by 45-50 grams a day to help aid in weight loss of 30 lbs.        Fall Risk Fall Risk  10/02/2017 09/22/2016 08/03/2016 07/27/2015 07/17/2014  Falls in the past year? No No No No No   Is the patient's home free of loose throw rugs in walkways, pet beds, electrical cords, etc?   yes      Grab bars in the bathroom? no      Handrails on the stairs?   no      Adequate lighting?   yes  Timed Get Up and Go performed: N/A  Depression Screen PHQ 2/9 Scores 10/02/2017 10/02/2017 09/22/2016 08/03/2016  PHQ - 2 Score 0 0 0 0  PHQ- 9 Score 3 - 2 3  Exception Documentation - - - -  Not completed - - - -     Cognitive Function: Pt declined screening today.      6CIT Screen 09/22/2016  What Year? 0 points  What month? 0 points  What time? 0 points  Count back from 20 0 points  Months in reverse 0 points  Repeat phrase 0 points  Total Score 0    Immunization History  Administered Date(s) Administered  . Influenza Split 11/23/2011  . Influenza-Unspecified 11/23/2011, 10/16/2012    Qualifies for Shingles  Vaccine? Due for Shingles vaccine. Declined my offer to administer today. Education has been provided regarding the importance of this vaccine. Pt has been advised to call her insurance company to determine her out of pocket expense. Advised she may also receive this vaccine at her local pharmacy or Health Dept. Verbalized acceptance and understanding.  Screening Tests Health Maintenance  Topic Date Due  . PNA vac Low Risk Adult (1 of 2 - PCV13) 02/24/2011  . MAMMOGRAM  04/27/2017  . INFLUENZA VACCINE  04/16/2018 (Originally 08/16/2017)  . Fecal DNA (Cologuard)  08/16/2018  . DEXA SCAN  Completed  . Hepatitis C Screening  Completed  . TETANUS/TDAP  Discontinued    Cancer Screenings: Lung: Low Dose CT Chest recommended if Age 68-80 years, 30 pack-year currently smoking OR have quit w/in 15years. Patient does not qualify. Breast:  Up to date on Mammogram? No, pt declined scheduling.   Up to date of Bone Density/Dexa? Yes Colorectal: Up to date  Additional Screenings:  Hepatitis C Screening: Up to date     Plan:  I have personally reviewed and addressed the Medicare Annual Wellness questionnaire and have noted the following in the patient's chart:  A. Medical and social history B. Use of alcohol, tobacco or illicit drugs  C. Current medications and supplements D. Functional ability and status E.  Nutritional status F.  Physical activity G. Advance directives H. List of other physicians I.  Hospitalizations, surgeries, and ER visits in previous 12 months J.  [place] such as hearing and vision if needed, cognitive and depression L. Referrals and appointments - none  In addition, I have reviewed and discussed with patient certain preventive protocols, quality metrics, and best practice recommendations. A written personalized care plan for preventive services as well as general preventive health  recommendations were provided to patient.  See attached scanned questionnaire  for additional information.   Signed,  Fabio Neighbors, LPN Nurse Health Advisor   Nurse Recommendations: Pt declined the influenza and Prevnar 13 vaccines today. Inquired about scheduling a mammogram this year and pt declines that too.   Reviewed documentation and recommendations of the Nurse Health Advisor. Agree with plans and was available for consultation during screening. Review with patient at next appointment.

## 2017-10-05 DIAGNOSIS — I10 Essential (primary) hypertension: Secondary | ICD-10-CM | POA: Diagnosis not present

## 2017-10-05 DIAGNOSIS — M4712 Other spondylosis with myelopathy, cervical region: Secondary | ICD-10-CM | POA: Diagnosis not present

## 2017-10-23 ENCOUNTER — Ambulatory Visit (INDEPENDENT_AMBULATORY_CARE_PROVIDER_SITE_OTHER): Payer: Medicare HMO | Admitting: Family Medicine

## 2017-10-23 ENCOUNTER — Other Ambulatory Visit: Payer: Self-pay

## 2017-10-23 ENCOUNTER — Encounter: Payer: Self-pay | Admitting: Family Medicine

## 2017-10-23 VITALS — BP 108/78 | HR 53 | Temp 97.6°F | Ht 62.5 in | Wt 155.0 lb

## 2017-10-23 DIAGNOSIS — N183 Chronic kidney disease, stage 3 unspecified: Secondary | ICD-10-CM

## 2017-10-23 DIAGNOSIS — I10 Essential (primary) hypertension: Secondary | ICD-10-CM | POA: Diagnosis not present

## 2017-10-23 DIAGNOSIS — E782 Mixed hyperlipidemia: Secondary | ICD-10-CM | POA: Diagnosis not present

## 2017-10-23 DIAGNOSIS — I340 Nonrheumatic mitral (valve) insufficiency: Secondary | ICD-10-CM

## 2017-10-23 DIAGNOSIS — M810 Age-related osteoporosis without current pathological fracture: Secondary | ICD-10-CM | POA: Diagnosis not present

## 2017-10-23 DIAGNOSIS — Z96652 Presence of left artificial knee joint: Secondary | ICD-10-CM | POA: Diagnosis not present

## 2017-10-23 DIAGNOSIS — M961 Postlaminectomy syndrome, not elsewhere classified: Secondary | ICD-10-CM

## 2017-10-23 DIAGNOSIS — M4316 Spondylolisthesis, lumbar region: Secondary | ICD-10-CM | POA: Diagnosis not present

## 2017-10-23 DIAGNOSIS — E559 Vitamin D deficiency, unspecified: Secondary | ICD-10-CM | POA: Diagnosis not present

## 2017-10-23 LAB — POCT URINALYSIS DIPSTICK
Appearance: NORMAL
BILIRUBIN UA: NEGATIVE
Blood, UA: NEGATIVE
GLUCOSE UA: NEGATIVE
Ketones, UA: NEGATIVE
Leukocytes, UA: NEGATIVE
Nitrite, UA: NEGATIVE
Protein, UA: NEGATIVE
Urobilinogen, UA: 0.2 E.U./dL
pH, UA: 5 (ref 5.0–8.0)

## 2017-10-23 NOTE — Progress Notes (Signed)
Patient: Molly Mitchell, Female    DOB: [birth year], 72 y.o.   MRN: [id number] Visit Date: 10/23/2017  Today's Provider: [name], PA   No chief complaint on file.  Subjective:      Complete Physical Molly Mitchell is a 72 y.o. female. She feels well. She reports exercising daily. She reports she is sleeping poorly.  ----------------------------------------------------------- Wt Readings from Last 3 Encounters:  10/02/17 154 lb 6.4 oz (70 kg)  06/29/17 149 lb 3.2 oz (67.7 kg)  03/29/17 143 lb (64.9 kg)    BP Readings from Last 3 Encounters:  10/02/17 (!) 148/72  06/29/17 140/78  03/29/17 (!) 153/62   Review of Systems  Constitutional: Negative.   HENT: Negative.   Eyes: Negative.   Respiratory: Negative.   Cardiovascular: Negative.   Gastrointestinal: Negative.   Endocrine: Negative.   Genitourinary: Negative.   Musculoskeletal: Positive for neck pain and neck stiffness. Negative for arthralgias, back pain, gait problem, joint swelling and myalgias.  Skin: Negative.   Allergic/Immunologic: Negative.   Neurological: Negative.   Hematological: Negative.   Psychiatric/Behavioral: Negative.     Social History   Socioeconomic History  . Marital status: Married    Spouse name: Not on file  . Number of children: 2  . Years of education: Not on file  . Highest education level: High school graduate  Occupational History  . Occupation: retired  Scientific laboratory technician  . Financial resource strain: Not hard at all  . Food insecurity:    Worry: Never true    Inability: Never true  . Transportation needs:    Medical: No    Non-medical: No  Tobacco Use  . Smoking status: Former Smoker    Last attempt to quit: 04/17/1979    Years since quitting: 38.5  . Smokeless tobacco: Never Used  Substance and Sexual Activity  . Alcohol use: No    Alcohol/week: 0.0 standard drinks  . Drug use: No  . Sexual activity: Not on file  Lifestyle  . Physical activity:    Days  per week: Not on file    Minutes per session: Not on file  . Stress: Not at all  Relationships  . Social connections:    Talks on phone: Not on file    Gets together: Not on file    Attends religious service: Not on file    Active member of club or organization: Not on file    Attends meetings of clubs or organizations: Not on file    Relationship status: Not on file  . Intimate partner violence:    Fear of current or ex partner: Not on file    Emotionally abused: Not on file    Physically abused: Not on file    Forced sexual activity: Not on file  Other Topics Concern  . Not on file  Social History Narrative  . Not on file   Past Medical History:  Diagnosis Date  . Arthritis   . Heart murmur    as a child some dr say they hear it some say they don't  . HLD (hyperlipidemia)   . Hypertension     Patient Active Problem List   Diagnosis Date Noted  . CKD (chronic kidney disease) stage 3, GFR 30-59 ml/min (HCC) 02/01/2017  . Hypokalemia 02/01/2017  . Spondylolisthesis of lumbar region 12/18/2016  . S/P total knee arthroplasty 07/31/2016  . Bradycardia 12/20/2015  . Encounter for Medicare annual wellness exam 07/27/2015  .  Acute bronchitis 05/13/2014  . Dysfunction of eustachian tube 05/13/2014  . Cardiac conduction disorder 05/13/2014  . Ache in joint 05/13/2014  . AB (asthmatic bronchitis) 05/13/2014  . Gonalgia 05/13/2014  . Asthma, cough variant 05/13/2014  . Screening for depression 05/13/2014  . Electrolyte imbalance 05/13/2014  . Fatigue 05/13/2014  . H/O total knee replacement 05/13/2014  . HLD (hyperlipidemia) 05/13/2014  . Elevated WBC count 05/13/2014  . Drug withdrawal syndrome to opium ([place]) 05/13/2014  . Arthritis of knee, degenerative 05/13/2014  . Osteopenia 05/13/2014  . Abdominal pain, right upper quadrant 05/13/2014  . Herpes zona 05/13/2014  . Periapical abscess without sinus tract 05/13/2014  . Pain in thoracic spine 05/13/2014  . MI (mitral  incompetence) 12/29/2013  . Idiopathic insomnia 10/16/2013  . Polypharmacy 10/16/2013  . Dizziness 07/29/2013  . Long term current use of systemic steroids 07/21/2013  . History of prolonged Q-T interval on ECG 05/22/2013  . Benign essential HTN 05/22/2013  . Personal history of other diseases of the circulatory system 05/22/2013  . 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 Combined fat and carbohydrate induced hyperlipemia 03/31/2013  . S/P lumbar spine operation 06/20/2012  . Cervical post-laminectomy syndrome 06/20/2012  . Status post lumbar spine operation 06/20/2012  . Tumoral calcinosis 10/31/2011  . Connective tissue disease, undifferentiated ([place]) 10/31/2011  . Effusion of knee 10/31/2011  . Chondrocalcinosis due to dicalcium phosphate crystals 10/31/2011  . Undifferentiated connective tissue disease ([place]) 10/31/2011  . Chronic cervical pain 03/28/2011  . Arthralgia of ankle or foot 11/30/2010  . Neoplasm of breast 10/26/2008  . Acne 07/24/2008  . Dizziness and giddiness 07/24/2008  . Cervical pain 05/12/2008  . Dyssomnia 05/12/2008  . Essential (primary) hypertension 05/12/2008   Past Surgical History:  Procedure Laterality Date  . ABDOMINAL HYSTERECTOMY  1981  . CORRECTION HAMMER TOE    . JOINT REPLACEMENT Left 03/03/2013  . MRI    . ROTATOR CUFF REPAIR Right 1981  . SPINAL FUSION  2001   lower back   . SPINAL FUSION  2003   neck  . TOTAL KNEE REVISION Left 07/31/2016   Procedure: TOTAL KNEE REVISION;  Surgeon: Dereck Leep, MD;  Location: [facility] ORS;  Service: Orthopedics;  Laterality: Left;   Her family history includes Cancer in her sister; Diabetes in her paternal grandmother; Heart failure in her maternal grandmother; Hypertension in her brother, mother, and sister.     Current Outpatient Medications:  .  albuterol (PROVENTIL HFA;VENTOLIN HFA) 108 (90 Base) MCG/ACT inhaler, Inhale 2 puffs into the lungs every 6 (six) hours as needed for  wheezing or shortness of breath. (Patient not taking: Reported on 03/29/2017), Disp: 1 Inhaler, Rfl: 2 .  alendronate (FOSAMAX) 70 MG tablet, Take 1 tablet (70 mg total) by mouth every 7 (seven) days. Take with a full glass of water on an empty stomach. (Patient not taking: Reported on 10/02/2017), Disp: 12 tablet, Rfl: 3 .  Ascorbic Acid (VITAMIN C) 1000 MG tablet, Take 1,000 mg by mouth daily. , Disp: , Rfl:  .  atenolol (TENORMIN) 50 MG tablet, Take 50 mg by mouth daily. , Disp: , Rfl:  .  Calcium-Magnesium 500-250 MG TABS, Take 2 tablets by mouth daily., Disp: , Rfl:  .  Cholecalciferol (VITAMIN D3) 1000 UNITS CAPS, Take 1,000 Units by mouth daily. , Disp: , Rfl:  .  meloxicam (MOBIC) 15 MG tablet, TAKE 1 TABLET EVERY DAY, Disp: 90 tablet, Rfl: 3 .  Multiple Vitamins-Minerals (MULTIVITAMIN WITH  MINERALS) tablet, Take 3 tablets by mouth daily., Disp: , Rfl:  .  naproxen sodium (ALEVE) 220 MG tablet, Take 220 mg by mouth daily as needed. , Disp: , Rfl:  .  pravastatin (PRAVACHOL) 40 MG tablet, Take 40 mg by mouth at bedtime., Disp: , Rfl:  .  vitamin E 400 UNIT capsule, Take 400 Units by mouth daily. , Disp: , Rfl:   Patient Care Team: Meghana Tullo, Vickki Muff, PA as PCP - General (Physician Assistant) Bryson Ha, OD as Consulting Physician (Optometry) Marry Guan, Laurice Record, MD as Consulting Physician (Orthopedic Surgery) Corey Skains, MD as Consulting Physician (Cardiology) Newman Pies, MD as Consulting Physician (Neurosurgery)     Objective:   Vitals: BP 108/78 (BP Location: Left Arm, Patient Position: Sitting, Cuff Size: Normal)   Pulse (!) 53   Temp 97.6 F (36.4 C) (Oral)   Ht 5' 2.5" (1.588 m)   Wt 155 lb (70.3 kg)   SpO2 98%   BMI 27.90 kg/m   Wt Readings from Last 3 Encounters:  10/23/17 155 lb (70.3 kg)  10/02/17 154 lb 6.4 oz (70 kg)  06/29/17 149 lb 3.2 oz (67.7 kg)   Physical Exam  Constitutional: She is oriented to person, place, and time. She appears  well-developed and well-nourished.  HENT:  Head: Normocephalic and atraumatic.  Right Ear: External ear normal.  Left Ear: External ear normal.  Nose: Nose normal.  Mouth/Throat: Oropharynx is clear and moist.  Eyes: Pupils are equal, round, and reactive to light. Conjunctivae and EOM are normal. Right eye exhibits no discharge.  Neck: Normal range of motion. Neck supple. No tracheal deviation present. No thyromegaly present.  Cardiovascular: Normal rate, regular rhythm, normal heart sounds and intact distal pulses.  No murmur heard. Pulmonary/Chest: Effort normal and breath sounds normal. No respiratory distress. She has no wheezes. She has no rales. She exhibits no tenderness.  Abdominal: Soft. She exhibits no distension and no mass. There is no tenderness. There is no rebound and no guarding.  Genitourinary:  Genitourinary Comments: Declines breast or pelvic exam. Will proceed with annual mammograms.  Musculoskeletal: Normal range of motion. She exhibits no edema or tenderness.  Lymphadenopathy:    She has no cervical adenopathy.  Neurological: She is alert and oriented to person, place, and time. She has normal reflexes. She displays normal reflexes. No cranial nerve deficit. She exhibits normal muscle tone. Coordination normal.  Skin: Skin is warm and dry. No rash noted. No erythema.  Psychiatric: She has a normal mood and affect. Her behavior is normal. Judgment and thought content normal.    Activities of Daily Living In your present state of health, do you have any difficulty performing the following activities: 10/02/2017 12/18/2016  Hearing? N N  Vision? N N  Difficulty concentrating or making decisions? N N  Walking or climbing stairs? N N  Dressing or bathing? N Y  Doing errands, shopping? N N  Preparing Food and eating ? N -  Using the Toilet? N -  Do you have problems with loss of bowel control? N -  Managing your Medications? N -  Managing your Finances? N -   Housekeeping or managing your Housekeeping? N -  Some recent data might be hidden    Fall Risk Assessment Fall Risk  10/02/2017 09/22/2016 08/03/2016 07/27/2015 07/17/2014  Falls in the past year? No No No No No     Depression Screen PHQ 2/9 Scores 10/02/2017 10/02/2017 09/22/2016 08/03/2016  PHQ - 2 Score  0 0 0 0  PHQ- 9 Score 3 - 2 3  Exception Documentation - - - -  Not completed - - - -    Cognitive Testing - 6-CIT  Correct? Score   What year is it? yes 1 0 or 4  What month is it? yes 1 0 or 3  Memorize:    Pia Mau,  43,  [address]  [place],      What time is it? (within 1 hour) yes 1 0 or 3  Count backwards from 20 yes 1 0, 2, or 4  Name the months of the year yes 1 0, 2, or 4  Repeat name & address above yes 1 0, 2, 4, 6, 8, or 10       TOTAL SCORE  6/28   Interpretation:  Normal  Normal (0-7) Abnormal (8-28)   Assessment & Plan:    Annual Physical Reviewed patient's Family Medical History Reviewed and updated list of patient's medical providers Assessment of cognitive impairment was done Assessed patient's functional ability Established a written schedule for health screening [place] Completed and Reviewed  Exercise Activities and Dietary recommendations Goals    . DIET - REDUCE CALORIE INTAKE     Recommend to continue current diet plan of not eating more than 1200 calories a day and watching carbohydrate intake.     . Weight (lb) < 130 lb (59 kg)     Recommend cutting down carbohydrates by 45-50 grams a day to help aid in weight loss of 30 lbs.        Immunization History  Administered Date(s) Administered  . Influenza Split 11/23/2011  . Influenza-Unspecified 11/23/2011, 10/16/2012    Health Maintenance  Topic Date Due  . PNA vac Low Risk Adult (1 of 2 - PCV13) 02/24/2011  . MAMMOGRAM  04/27/2017  . INFLUENZA VACCINE  04/16/2018 (Originally 08/16/2017)  . Fecal DNA (Cologuard)  08/16/2018  . DEXA SCAN  Completed  . Hepatitis C  Screening  Completed  . TETANUS/TDAP  Discontinued     Discussed health benefits of physical activity, and encouraged her to engage in regular exercise appropriate for her age and condition.    ------------------------------------------------------------------------------------------------------------ 1. Essential (primary) hypertension Well controlled on the Atenolol 20 mg qd. No chest pains, palpitations, dyspnea or edema. Will recheck routine labs and urinalysis normal. Encouraged to limit sodium intake and maintain weight control. - POCT Urinalysis Dipstick - CBC with Differential/Platelet - Comprehensive metabolic panel - Lipid panel - TSH  2. Mitral valve insufficiency, unspecified etiology Intermittent murmur of MVP followed by Dr. Nehemiah Massed (cardiologist). Denies chest pains, palpitations or dyspnea. Continue rechecks with cardiologist as planned.  3. Spondylolisthesis of lumbar region History of lumbar laminectomy and fusion by Dr. Arnoldo Morale  Dec. 2018. Well healed scar, fair ROM and good strength in LE.  4. CKD (chronic kidney disease) stage 3, GFR 30-59 ml/min (HCC) GFR dropped to 39 12-18-16 when she was hospitalized for lumbar fusion and laminectomy by Dr. Arnoldo Morale (neurosurgeon). Recovered GFR to 60 the next day. Recheck CMP. - Comprehensive metabolic panel  5. History of total left knee replacement Well healed with good ROM and strength. Followed by Dr. Marry Guan (orthopedist) on 07-26-17. Continues home exercise program for strengthening.  6. Mixed hyperlipidemia Tolerating Pravastatin 40 mg qd without side effects. Continue low fat diet and regular exercise program. Recheck CMP, TSH and Lipid Panel. Follow up pending reports. - Comprehensive metabolic panel - Lipid panel - TSH  7.  Cervical post-laminectomy syndrome Followed by neurosurgeon.C onsidering more surgery if neck pain and right arm weakness not improving.  8. Vitamin D deficiency Still taking Vitamin D 1000 IU  qd. History of osteoporosis with sacral fracture. Recheck labs. - CBC with Differential/Platelet - Comprehensive metabolic panel - VITAMIN D 25 Hydroxy (Vit-D Deficiency, Fractures)  9. Age-related osteoporosis without current pathological fracture Presently on Fosamax once a week but was concerned about hair thinning episode being a side effect. Stopped it for a while and hair is better. Trying it again and no trouble with hair. Will continue for the next 3 months and decide if she needs a different medication. Check routine labs. Has a history of an occult sacral fracture due to osteoporosis and subsequent sacral plasty 03-29-17 by Dr. Rudene Christians (orthopedist). Recheck labs and BMD due July 2020. - CBC with Differential/Platelet - Comprehensive metabolic panel - VITAMIN D 25 Hydroxy (Vit-D Deficiency, Fractures)    [name], PA  [facility]

## 2017-10-24 LAB — LIPID PANEL
CHOLESTEROL TOTAL: 222 mg/dL — AB (ref 100–199)
Chol/HDL Ratio: 2.7 ratio (ref 0.0–4.4)
HDL: 81 mg/dL (ref >39)
LDL CALC: 124 mg/dL — AB (ref 0–99)
Triglycerides: 84 mg/dL (ref 0–149)
VLDL CHOLESTEROL CAL: 17 mg/dL (ref 5–40)

## 2017-10-24 LAB — CBC WITH DIFFERENTIAL/PLATELET
BASOS: 1 %
Basophils Absolute: 0.1 10*3/uL (ref 0.0–0.2)
EOS (ABSOLUTE): 0.1 10*3/uL (ref 0.0–0.4)
EOS: 2 %
HEMATOCRIT: 43.5 % (ref 34.0–46.6)
HEMOGLOBIN: 14.9 g/dL (ref 11.1–15.9)
IMMATURE GRANULOCYTES: 0 %
Immature Grans (Abs): 0 10*3/uL (ref 0.0–0.1)
Lymphocytes Absolute: 2.4 10*3/uL (ref 0.7–3.1)
Lymphs: 35 %
MCH: 31.8 pg (ref 26.6–33.0)
MCHC: 34.3 g/dL (ref 31.5–35.7)
MCV: 93 fL (ref 79–97)
MONOCYTES: 9 %
Monocytes Absolute: 0.6 10*3/uL (ref 0.1–0.9)
Neutrophils Absolute: 3.7 10*3/uL (ref 1.4–7.0)
Neutrophils: 53 %
Platelets: 223 10*3/uL (ref 150–450)
RBC: 4.69 x10E6/uL (ref 3.77–5.28)
RDW: 13.4 % (ref 12.3–15.4)
WBC: 6.9 10*3/uL (ref 3.4–10.8)

## 2017-10-24 LAB — COMPREHENSIVE METABOLIC PANEL
ALBUMIN: 4.4 g/dL (ref 3.5–4.8)
ALK PHOS: 76 IU/L (ref 39–117)
ALT: 21 IU/L (ref 0–32)
AST: 23 IU/L (ref 0–40)
Albumin/Globulin Ratio: 1.8 (ref 1.2–2.2)
BILIRUBIN TOTAL: 0.5 mg/dL (ref 0.0–1.2)
BUN / CREAT RATIO: 40 — AB (ref 12–28)
BUN: 38 mg/dL — AB (ref 8–27)
CO2: 22 mmol/L (ref 20–29)
Calcium: 9.6 mg/dL (ref 8.7–10.3)
Chloride: 98 mmol/L (ref 96–106)
Creatinine, Ser: 0.94 mg/dL (ref 0.57–1.00)
GFR calc non Af Amer: 61 mL/min/{1.73_m2} (ref >59)
GFR, EST AFRICAN AMERICAN: 71 mL/min/{1.73_m2} (ref >59)
GLOBULIN, TOTAL: 2.4 g/dL (ref 1.5–4.5)
Glucose: 79 mg/dL (ref 65–99)
Potassium: 4.1 mmol/L (ref 3.5–5.2)
SODIUM: 138 mmol/L (ref 134–144)
TOTAL PROTEIN: 6.8 g/dL (ref 6.0–8.5)

## 2017-10-24 LAB — VITAMIN D 25 HYDROXY (VIT D DEFICIENCY, FRACTURES): VIT D 25 HYDROXY: 33.5 ng/mL (ref 30.0–100.0)

## 2017-10-24 LAB — TSH: TSH: 1.5 u[IU]/mL (ref 0.450–4.500)

## 2017-10-25 ENCOUNTER — Telehealth: Payer: Self-pay

## 2017-10-25 NOTE — Telephone Encounter (Signed)
-----   Message from Margo Common, [place] sent at 10/25/2017  1:43 PM EDT ----- All blood tests normal except LDL and total cholesterol back up. Change Pravastatin 40 mg to Atorvastatin 40 mg qd (which pharmacy?). Recheck levels in 3 months. Vitamin D level low normal. Continue Vitamin D 1000 IU qd (OTC).

## 2017-10-25 NOTE — Telephone Encounter (Signed)
Patient was advised and wants the atorvastatin 40mg  send to the [facility] on file. Patient stated she looked online and seen that her kidney levels was elevated and would like to know what could be done.,PC

## 2017-10-25 NOTE — Telephone Encounter (Signed)
LVMTRC.,PC 

## 2017-10-26 NOTE — Telephone Encounter (Signed)
Patient was advised.,PC 

## 2017-10-26 NOTE — Telephone Encounter (Signed)
Drinking 6-8 glasses of water a day should flush out the BUN and get the ratio back in order. The real kidney function tests (Creatinine and GFR are both in very good shape).

## 2017-11-01 ENCOUNTER — Encounter: Payer: Self-pay | Admitting: Family Medicine

## 2017-11-02 ENCOUNTER — Other Ambulatory Visit: Payer: Self-pay | Admitting: Family Medicine

## 2017-11-02 DIAGNOSIS — L304 Erythema intertrigo: Secondary | ICD-10-CM

## 2017-11-02 MED ORDER — NYSTATIN-TRIAMCINOLONE 100000-0.1 UNIT/GM-% EX OINT: 1.0000 "application " | TOPICAL_OINTMENT | Freq: Two times a day (BID) | CUTANEOUS | 0 refills | Status: DC

## 2017-11-08 DIAGNOSIS — Z1231 Encounter for screening mammogram for malignant neoplasm of breast: Secondary | ICD-10-CM | POA: Diagnosis not present

## 2017-11-08 LAB — HM MAMMOGRAPHY

## 2017-12-14 ENCOUNTER — Encounter: Payer: Self-pay | Admitting: Family Medicine

## 2017-12-17 ENCOUNTER — Other Ambulatory Visit: Payer: Self-pay | Admitting: *Deleted

## 2017-12-17 MED ORDER — ALENDRONATE SODIUM 70 MG PO TABS: 70.0000 mg | ORAL_TABLET | ORAL | 3 refills | Status: DC

## 2017-12-20 DIAGNOSIS — M2041 Other hammer toe(s) (acquired), right foot: Secondary | ICD-10-CM | POA: Diagnosis not present

## 2017-12-20 DIAGNOSIS — M79671 Pain in right foot: Secondary | ICD-10-CM | POA: Diagnosis not present

## 2017-12-28 ENCOUNTER — Encounter: Payer: Self-pay | Admitting: Family Medicine

## 2017-12-31 ENCOUNTER — Other Ambulatory Visit: Payer: Self-pay | Admitting: Family Medicine

## 2017-12-31 ENCOUNTER — Encounter: Payer: Self-pay | Admitting: Family Medicine

## 2018-01-01 ENCOUNTER — Other Ambulatory Visit: Payer: Self-pay | Admitting: Family Medicine

## 2018-01-01 MED ORDER — AMOXICILLIN 500 MG PO CAPS: ORAL_CAPSULE | ORAL | 3 refills | Status: DC

## 2018-01-01 NOTE — Progress Notes (Signed)
Needs refill of prophylactic antibiotic to take prior to dental procedure do to past joint replacements. Sent Amoxil 500 mg 4 tablets 1 hour to her pharmacy to take prior to procedures.

## 2018-01-14 ENCOUNTER — Encounter: Payer: Self-pay | Admitting: Family Medicine

## 2018-01-21 ENCOUNTER — Encounter: Payer: Self-pay | Admitting: Family Medicine

## 2018-01-21 ENCOUNTER — Ambulatory Visit (INDEPENDENT_AMBULATORY_CARE_PROVIDER_SITE_OTHER): Payer: Medicare HMO | Admitting: Family Medicine

## 2018-01-21 VITALS — BP 145/76 | HR 74 | Temp 98.2°F | Resp 16 | Ht 62.5 in | Wt 158.4 lb

## 2018-01-21 DIAGNOSIS — Z01818 Encounter for other preprocedural examination: Secondary | ICD-10-CM | POA: Diagnosis not present

## 2018-01-21 DIAGNOSIS — Z96652 Presence of left artificial knee joint: Secondary | ICD-10-CM

## 2018-01-21 DIAGNOSIS — I340 Nonrheumatic mitral (valve) insufficiency: Secondary | ICD-10-CM | POA: Diagnosis not present

## 2018-01-21 DIAGNOSIS — M8589 Other specified disorders of bone density and structure, multiple sites: Secondary | ICD-10-CM

## 2018-01-21 DIAGNOSIS — M961 Postlaminectomy syndrome, not elsewhere classified: Secondary | ICD-10-CM | POA: Diagnosis not present

## 2018-01-21 DIAGNOSIS — I10 Essential (primary) hypertension: Secondary | ICD-10-CM | POA: Diagnosis not present

## 2018-01-21 NOTE — Progress Notes (Signed)
Patient: Molly Mitchell Female    DOB: 02-21-1945   73 y.o.   MRN: [id number] Visit Date: 01/21/2018  Today's Provider: [name], PA   Chief Complaint  Patient presents with  . Pre-op Exam   Subjective:     HPI  Pre Op Clearance:   Molly Mitchell has been scheduled for Hammer Toe Correction on 01/30/2018 . Patient reports overall feeling well. She feels she has fluid on left ear. There are no changes in the patient's condition to prevent proceeding with the planned procedure.  Recent labs:  Lab Results  Component Value Date   WBC 6.9 10/23/2017   HGB 14.9 10/23/2017   HCT 43.5 10/23/2017   PLT 223 10/23/2017   GLUCOSE 79 10/23/2017   CHOL 222 (H) 10/23/2017   TRIG 84 10/23/2017   HDL 81 10/23/2017   LDLCALC 124 (H) 10/23/2017   ALT 21 10/23/2017   AST 23 10/23/2017   NA 138 10/23/2017   K 4.1 10/23/2017   CL 98 10/23/2017   CREATININE 0.94 10/23/2017   BUN 38 (H) 10/23/2017   CO2 22 10/23/2017   TSH 1.500 10/23/2017   INR 0.96 07/17/2016    Past Medical History:  Diagnosis Date  . Arthritis   . Heart murmur    as a child some dr say they hear it some say they don't  . HLD (hyperlipidemia)   . Hypertension   . Stenosis of artery in neck ([place]) 09/07/2017   Past Surgical History:  Procedure Laterality Date  . ABDOMINAL HYSTERECTOMY  1981  . CORRECTION HAMMER TOE    . JOINT REPLACEMENT Left 03/03/2013  . MRI    . ROTATOR CUFF REPAIR Right 1981  . SPINAL FUSION  2001   lower back   . SPINAL FUSION  2003   neck  . TOTAL KNEE REVISION Left 07/31/2016   Procedure: TOTAL KNEE REVISION;  Surgeon: Dereck Leep, MD;  Location: [facility] ORS;  Service: Orthopedics;  Laterality: Left;   Family History  Problem Relation Age of Onset  . Hypertension Brother   . Hypertension Mother   . Hypertension Sister   . Cancer Sister   . Diabetes Paternal Grandmother   . Heart failure Maternal Grandmother    Allergies  Allergen Reactions  . Amitriptyline Hcl  Swelling and Other (See Comments)    Facial swelling  . Duricef [Cefadroxil] Swelling and Other (See Comments)    Facial swelling  . Levofloxacin Other (See Comments)    Severe Shoulder Pain  . Tetanus Toxoid Swelling and Other (See Comments)    Arm swelling  . Erythromycin Rash and Other (See Comments)    Rash on bottom    Current Outpatient Medications:  .  alendronate (FOSAMAX) 70 MG tablet, Take 1 tablet (70 mg total) by mouth every 7 (seven) days. Take with a full glass of water on an empty stomach., Disp: 12 tablet, Rfl: 3 .  Ascorbic Acid (VITAMIN C) 1000 MG tablet, Take 1,000 mg by mouth daily. , Disp: , Rfl:  .  atenolol (TENORMIN) 50 MG tablet, Take 50 mg by mouth daily. , Disp: , Rfl:  .  Calcium-Magnesium 500-250 MG TABS, Take 2 tablets by mouth daily., Disp: , Rfl:  .  Cholecalciferol (VITAMIN D3) 1000 UNITS CAPS, Take 1,000 Units by mouth daily. , Disp: , Rfl:  .  meloxicam (MOBIC) 15 MG tablet, TAKE 1 TABLET EVERY DAY, Disp: 90 tablet, Rfl: 3 .  Multiple  Vitamins-Minerals (MULTIVITAMIN WITH MINERALS) tablet, Take 3 tablets by mouth daily., Disp: , Rfl:  .  naproxen sodium (ALEVE) 220 MG tablet, Take 220 mg by mouth daily as needed. , Disp: , Rfl:  .  nystatin-triamcinolone ointment (MYCOLOG), Apply 1 application topically 2 (two) times daily., Disp: 60 g, Rfl: 0 .  pravastatin (PRAVACHOL) 40 MG tablet, Take 40 mg by mouth at bedtime., Disp: , Rfl:  .  vitamin E 400 UNIT capsule, Take 400 Units by mouth daily. , Disp: , Rfl:  .  amoxicillin (AMOXIL) 500 MG capsule, Take 4 tablets by mouth 1 hour prior to dental procedures., Disp: 12 capsule, Rfl: 3  Review of Systems  Social History   Tobacco Use  . Smoking status: Former Smoker    Last attempt to quit: 04/17/1979    Years since quitting: 38.7  . Smokeless tobacco: Never Used  Substance Use Topics  . Alcohol use: No    Alcohol/week: 0.0 standard drinks      Objective:   BP (!) 145/76 (BP Location: Right Arm,  Patient Position: Sitting, Cuff Size: Normal)   Pulse 74   Temp 98.2 F (36.8 C) (Oral)   Resp 16   Ht 5' 2.5" (1.588 m)   Wt 158 lb 6.4 oz (71.8 kg)   BMI 28.51 kg/m  Vitals:   01/21/18 1517  BP: (!) 145/76  Pulse: 74  Resp: 16  Temp: 98.2 F (36.8 C)  TempSrc: Oral  Weight: 158 lb 6.4 oz (71.8 kg)  Height: 5' 2.5" (1.588 m)     Physical Exam Constitutional:      General: She is not in acute distress.    Appearance: She is well-developed.  HENT:     Head: Normocephalic and atraumatic.     Right Ear: Hearing, tympanic membrane and ear canal normal.     Left Ear: Hearing, tympanic membrane and ear canal normal.     Nose: Nose normal.     Mouth/Throat:     Mouth: Mucous membranes are moist.     Pharynx: Oropharynx is clear.  Eyes:     General: Lids are normal. No scleral icterus.       Right eye: No discharge.        Left eye: No discharge.     Extraocular Movements: Extraocular movements intact.     Conjunctiva/sclera: Conjunctivae normal.  Cardiovascular:     Rate and Rhythm: Normal rate and regular rhythm.     Pulses: Normal pulses.     Heart sounds: Normal heart sounds.  Pulmonary:     Effort: Pulmonary effort is normal. No respiratory distress.  Abdominal:     General: Bowel sounds are normal.     Palpations: Abdomen is soft.  Musculoskeletal: Normal range of motion.     Comments: Crepitus right knee. Well healed left knee - total replacement in 2015 and revision in 2018 - with good ROM. Normal pulses throughout. Right 2nd and 3rd hammertoes with overlap on to great toe of 2nd toe. Tender red 2nd toes PIP joint dorsally. Some stiffness of neck with history of cervical laminectomy in 2003 (planning more surgery in the Fall of 2020). Some weakness in the right arm.  Skin:    General: Skin is warm and dry.     Findings: No lesion or rash.  Neurological:     Mental Status: She is alert and oriented to person, place, and time.  Psychiatric:        Speech: Speech  normal.  Behavior: Behavior normal.        Thought Content: Thought content normal.       Assessment & Plan    1. Pre-operative clearance Right 2nd and 3rd hammertoes for surgical correction by Dr. Vickki Muff on 01-30-2018. Stable physical exam with low risk for surgical intervention. Completed H&P form and attached labs, EKG, medication list and allergy list. - EKG 12-Lead  2. Essential (primary) hypertension Stable and well controlled BP on the Atenolol 50 mg qd. No chest pains, palpitations, peripheral edema or dyspnea.   3. Mitral valve insufficiency, unspecified etiology No murmur today. History of intermittent murmur of MVP followed by Dr. Nehemiah Massed (cardiologist).  4. Osteopenia of multiple sites Rheumatologist diagnosed osteopenia 12-23-13 and has been on calcium supplements. Bone density test (02-21-17) showed progression to osteoporosis and need for Vitamin-D 1000 IU qd with Alendronate (Fosamax) 70 mg one tablet a week. Tolerating regimen well.   5. History of total left knee replacement Had severe osteoarthritis with total left knee replacement in 2015 by Dr. Marry Guan (orthopedist) and revision of tibial collapse/loosening in 2018. Well healed without left knee discomfort. History of pseudogout and positive ANA in 2012 (moderately high titer at 1:640)  6. Cervical post-laminectomy syndrome Chronic pain and stiffness in neck with some weakness in the right arm since anterior fusion in 2003.      [name], PA  [facility]

## 2018-01-22 ENCOUNTER — Other Ambulatory Visit: Payer: Self-pay | Admitting: Podiatry

## 2018-01-22 DIAGNOSIS — M2041 Other hammer toe(s) (acquired), right foot: Secondary | ICD-10-CM | POA: Diagnosis not present

## 2018-01-22 DIAGNOSIS — M79671 Pain in right foot: Secondary | ICD-10-CM | POA: Diagnosis not present

## 2018-01-23 NOTE — Anesthesia Preprocedure Evaluation (Addendum)
Anesthesia Evaluation  Patient identified by MRN, date of birth, ID band Patient awake    Reviewed: Allergy & Precautions, NPO status , Patient's Chart, lab work & pertinent test results  History of Anesthesia Complications Negative for: history of anesthetic complications  Airway Mallampati: III   Neck ROM: Full    Dental  (+)    Pulmonary former smoker (quit 1981),    Pulmonary exam normal breath sounds clear to auscultation       Cardiovascular hypertension, + Valvular Problems/Murmurs (murmur)  Rhythm:Regular Rate:Normal + Systolic murmurs ECG 05/23/28:  Normal sinus rhythm Normal ECG   Neuro/Psych negative neurological ROS     GI/Hepatic negative GI ROS,   Endo/Other  negative endocrine ROS  Renal/GU Renal disease (stage III CKD)     Musculoskeletal  (+) Arthritis ,   Abdominal   Peds  Hematology negative hematology ROS (+)   Anesthesia Other Findings PCP Note 01/21/18:  Assessment   1. Pre-operative clearance Right 2nd and 3rd hammertoes for surgical correction by Dr. Vickki Muff on 01-30-2018. Stable physical exam with low risk for surgical intervention. Completed H&P form and attached labs, EKG, medication list and allergy list. - EKG 12-Lead  2. Essential (primary) hypertension Stable and well controlled BP on the Atenolol 50 mg qd. No chest pains, palpitations, peripheral edema or dyspnea.   3. Mitral valve insufficiency, unspecified etiology No murmur today. History of intermittent murmur of MVP followed by Dr. Nehemiah Massed (cardiologist).  4. Osteopenia of multiple sites Rheumatologist diagnosed osteopenia 12-23-13 and has been on calcium supplements. Bone density test (02-21-17) showed progression to osteoporosis and need for Vitamin-D 1000 IU qd with Alendronate (Fosamax) 70 mg one tablet a week. Tolerating regimen well.   5. History of total left knee replacement Had severe osteoarthritis with total  left knee replacement in 2015 by Dr. Marry Guan (orthopedist) and revision of tibial collapse/loosening in 2018. Well healed without left knee discomfort. History of pseudogout and positive ANA in 2012 (moderately high titer at 1:640)  6. Cervical post-laminectomy syndrome Chronic pain and stiffness in neck with some weakness in the right arm since anterior fusion in 2003.  [name], PA  [facility]  Reproductive/Obstetrics                            Anesthesia Physical Anesthesia Plan  ASA: II  Anesthesia Plan: General   Post-op Pain Management:    Induction: Intravenous  PONV Risk Score and Plan: 3 and Dexamethasone and Ondansetron  Airway Management Planned: LMA  Additional Equipment:   Intra-op Plan:   Post-operative Plan: Extubation in OR  Informed Consent: I have reviewed the patients History and Physical, chart, labs and discussed the procedure including the risks, benefits and alternatives for the proposed anesthesia with the patient or authorized representative who has indicated his/her understanding and acceptance.       Plan Discussed with: CRNA  Anesthesia Plan Comments:        Anesthesia Quick Evaluation

## 2018-01-30 ENCOUNTER — Encounter
Admission: RE | Disposition: A | Discharge status: Home / Self Care | Payer: Self-pay | Source: Home / Self Care | Attending: Podiatry

## 2018-01-30 ENCOUNTER — Ambulatory Visit: Payer: Medicare HMO | Admitting: Anesthesiology

## 2018-01-30 ENCOUNTER — Ambulatory Visit
Admission: RE | Admit: 2018-01-30 | Discharge: 2018-01-30 | Disposition: A | Discharge status: Home / Self Care | Payer: Medicare HMO | Attending: Podiatry | Admitting: Podiatry

## 2018-01-30 DIAGNOSIS — M069 Rheumatoid arthritis, unspecified: Secondary | ICD-10-CM | POA: Insufficient documentation

## 2018-01-30 DIAGNOSIS — Z79899 Other long term (current) drug therapy: Secondary | ICD-10-CM | POA: Diagnosis not present

## 2018-01-30 DIAGNOSIS — E782 Mixed hyperlipidemia: Secondary | ICD-10-CM | POA: Diagnosis not present

## 2018-01-30 DIAGNOSIS — I10 Essential (primary) hypertension: Secondary | ICD-10-CM | POA: Diagnosis not present

## 2018-01-30 DIAGNOSIS — M2041 Other hammer toe(s) (acquired), right foot: Secondary | ICD-10-CM | POA: Insufficient documentation

## 2018-01-30 DIAGNOSIS — M216X1 Other acquired deformities of right foot: Secondary | ICD-10-CM | POA: Diagnosis not present

## 2018-01-30 HISTORY — PX: HAMMER TOE SURGERY: SHX385

## 2018-01-30 HISTORY — PX: AIKEN OSTEOTOMY: SHX6331

## 2018-01-30 SURGERY — CORRECTION, HAMMER TOE
Anesthesia: General | Site: Foot | Laterality: Right

## 2018-01-30 MED ORDER — OXYCODONE HCL 5 MG/5ML PO SOLN: 5.0000 mg | Freq: Once | ORAL | Status: DC | PRN

## 2018-01-30 MED ORDER — ACETAMINOPHEN 10 MG/ML IV SOLN: 1000.0000 mg | Freq: Once | INTRAVENOUS | Status: DC | PRN

## 2018-01-30 MED ORDER — POVIDONE-IODINE 7.5 % EX SOLN
Freq: Once | CUTANEOUS | Status: AC
  Administered 2018-01-30: 12:00:00 via TOPICAL

## 2018-01-30 MED ORDER — DEXAMETHASONE SODIUM PHOSPHATE 4 MG/ML IJ SOLN
INTRAMUSCULAR | Status: DC | PRN
  Administered 2018-01-30: 4 mg via INTRAVENOUS

## 2018-01-30 MED ORDER — LIDOCAINE HCL (CARDIAC) PF 100 MG/5ML IV SOSY
PREFILLED_SYRINGE | INTRAVENOUS | Status: DC | PRN
  Administered 2018-01-30: 30 mg via INTRATRACHEAL

## 2018-01-30 MED ORDER — OXYCODONE HCL 5 MG PO TABS: 5.0000 mg | ORAL_TABLET | Freq: Once | ORAL | Status: DC | PRN

## 2018-01-30 MED ORDER — FENTANYL CITRATE (PF) 100 MCG/2ML IJ SOLN
INTRAMUSCULAR | Status: DC | PRN
  Administered 2018-01-30: 25 ug via INTRAVENOUS

## 2018-01-30 MED ORDER — ONDANSETRON HCL 4 MG/2ML IJ SOLN
INTRAMUSCULAR | Status: DC | PRN
  Administered 2018-01-30: 4 mg via INTRAVENOUS

## 2018-01-30 MED ORDER — ONDANSETRON HCL 4 MG/2ML IJ SOLN: 4.0000 mg | Freq: Four times a day (QID) | INTRAMUSCULAR | Status: DC | PRN

## 2018-01-30 MED ORDER — PROPOFOL 10 MG/ML IV BOLUS
INTRAVENOUS | Status: DC | PRN
  Administered 2018-01-30: 150 mg via INTRAVENOUS

## 2018-01-30 MED ORDER — BUPIVACAINE LIPOSOME 1.3 % IJ SUSP
INTRAMUSCULAR | Status: DC | PRN
  Administered 2018-01-30: 9 mL

## 2018-01-30 MED ORDER — LACTATED RINGERS IV SOLN: INTRAVENOUS | Status: DC

## 2018-01-30 MED ORDER — FENTANYL CITRATE (PF) 100 MCG/2ML IJ SOLN: 25.0000 ug | INTRAMUSCULAR | Status: DC | PRN

## 2018-01-30 MED ORDER — MIDAZOLAM HCL 5 MG/5ML IJ SOLN
INTRAMUSCULAR | Status: DC | PRN
  Administered 2018-01-30: 2 mg via INTRAVENOUS

## 2018-01-30 MED ORDER — EPHEDRINE SULFATE 50 MG/ML IJ SOLN
INTRAMUSCULAR | Status: DC | PRN
  Administered 2018-01-30 (×3): 10 mg via INTRAVENOUS

## 2018-01-30 MED ORDER — LACTATED RINGERS IV SOLN
INTRAVENOUS | Status: DC
  Administered 2018-01-30: 12:00:00 via INTRAVENOUS

## 2018-01-30 MED ORDER — GLYCOPYRROLATE 0.2 MG/ML IJ SOLN
INTRAMUSCULAR | Status: DC | PRN
  Administered 2018-01-30: 0.1 mg via INTRAVENOUS

## 2018-01-30 MED ORDER — CLINDAMYCIN PHOSPHATE 900 MG/50ML IV SOLN
900.0000 mg | INTRAVENOUS | Status: AC
  Administered 2018-01-30: 900 mg via INTRAVENOUS

## 2018-01-30 MED ORDER — BUPIVACAINE HCL (PF) 0.25 % IJ SOLN
INTRAMUSCULAR | Status: DC | PRN
  Administered 2018-01-30: 9 mL

## 2018-01-30 MED ORDER — ONDANSETRON HCL 4 MG PO TABS: 4.0000 mg | ORAL_TABLET | Freq: Four times a day (QID) | ORAL | Status: DC | PRN

## 2018-01-30 SURGICAL SUPPLY — 50 items
BANDAGE ELASTIC 4 VELCRO NS (GAUZE/BANDAGES/DRESSINGS) ×2 IMPLANT
BENZOIN TINCTURE PRP APPL 2/3 (GAUZE/BANDAGES/DRESSINGS) ×2 IMPLANT
BIT DRILL 1.7 LNG CANN (DRILL) ×1 IMPLANT
BLADE MED AGGRESSIVE (BLADE) IMPLANT
BLADE OSC/SAGITTAL 5.5X25 (BLADE) ×1 IMPLANT
BLADE OSC/SAGITTAL MD 5.5X18 (BLADE) ×1 IMPLANT
BLADE SURG 15 STRL LF DISP TIS (BLADE) IMPLANT
BLADE SURG 15 STRL SS (BLADE) ×1
BNDG COHESIVE 4X5 TAN STRL (GAUZE/BANDAGES/DRESSINGS) ×2 IMPLANT
BNDG ESMARK 4X12 TAN STRL LF (GAUZE/BANDAGES/DRESSINGS) ×2 IMPLANT
BNDG GAUZE 4.5X4.1 6PLY STRL (MISCELLANEOUS) ×2 IMPLANT
BNDG STRETCH 4X75 STRL LF (GAUZE/BANDAGES/DRESSINGS) ×2 IMPLANT
CANISTER SUCT 1200ML W/VALVE (MISCELLANEOUS) ×2 IMPLANT
CNTRSNK DRL 2 SCR (MISCELLANEOUS) IMPLANT
COUNTERSINK 2.0 (MISCELLANEOUS) ×1
COVER LIGHT HANDLE UNIVERSAL (MISCELLANEOUS) ×4 IMPLANT
CUFF TOURN SGL QUICK 18 (TOURNIQUET CUFF) ×1 IMPLANT
DRAPE FLUOR MINI C-ARM 54X84 (DRAPES) ×2 IMPLANT
DURAPREP 26ML APPLICATOR (WOUND CARE) ×2 IMPLANT
ELECT REM PT RETURN 9FT ADLT (ELECTROSURGICAL) ×2
ELECTRODE REM PT RTRN 9FT ADLT (ELECTROSURGICAL) ×1 IMPLANT
GAUZE PETRO XEROFOAM 1X8 (MISCELLANEOUS) ×2 IMPLANT
GAUZE SPONGE 4X4 12PLY STRL (GAUZE/BANDAGES/DRESSINGS) ×2 IMPLANT
GLOVE BIO SURGEON STRL SZ7.5 (GLOVE) ×3 IMPLANT
GLOVE INDICATOR 8.0 STRL GRN (GLOVE) ×1 IMPLANT
GOWN STRL REUS W/ TWL LRG LVL3 (GOWN DISPOSABLE) ×2 IMPLANT
GOWN STRL REUS W/TWL LRG LVL3 (GOWN DISPOSABLE) ×2
K-WIRE DBL END TROCAR 6X.045 (WIRE)
K-WIRE DBL END TROCAR 6X.062 (WIRE)
K-WIRE DBL TROCAR .045X4 ×4 IMPLANT
KIT TURNOVER KIT A (KITS) ×2 IMPLANT
KWIRE DBL END TROCAR 6X.045 (WIRE) IMPLANT
KWIRE DBL END TROCAR 6X.062 (WIRE) IMPLANT
KWIRE DBL TROCAR .045X4 IMPLANT
NS IRRIG 500ML POUR BTL (IV SOLUTION) ×2 IMPLANT
PACK EXTREMITY ARMC (MISCELLANEOUS) ×2 IMPLANT
PENCIL SMOKE EVACUATOR (MISCELLANEOUS) ×2 IMPLANT
PIN BALLS 3/8 F/.045 WIRE (MISCELLANEOUS) ×3 IMPLANT
RASP SM TEAR CROSS CUT (RASP) IMPLANT
SCREW HEADLESS SHRT THRD 2X12 (Screw) ×2 IMPLANT
STOCKINETTE IMPERVIOUS LG (DRAPES) ×2 IMPLANT
STRIP CLOSURE SKIN 1/4X4 (GAUZE/BANDAGES/DRESSINGS) ×2 IMPLANT
SUT ETHILON 4 0 FS (SUTURE) ×2 IMPLANT
SUT ETHILON 5-0 FS-2 18 BLK (SUTURE) IMPLANT
SUT MNCRL 5-0+ PC-1 (SUTURE) IMPLANT
SUT MONOCRYL 5-0 (SUTURE) ×1
SUT VIC AB 3-0 SH 27 (SUTURE) ×1
SUT VIC AB 3-0 SH 27X BRD (SUTURE) IMPLANT
SUT VIC AB 4-0 FS2 27 (SUTURE) ×1 IMPLANT
WIRE SMOOTH TROCAR .9MMX150MML (WIRE) ×1 IMPLANT

## 2018-01-30 NOTE — Anesthesia Postprocedure Evaluation (Signed)
Anesthesia Post Note  Patient: Molly Mitchell  Procedure(s) Performed: HAMMER TOE CORRECTION SECOND AND THIRD (Right Foot) WEIL RIGHT 2ND AND 3RD (Right Foot)  Patient location during evaluation: PACU Anesthesia Type: General Level of consciousness: awake and alert, oriented and patient cooperative Pain management: pain level controlled Vital Signs Assessment: post-procedure vital signs reviewed and stable Respiratory status: spontaneous breathing, nonlabored ventilation and respiratory function stable Cardiovascular status: blood pressure returned to baseline and stable Postop Assessment: adequate PO intake Anesthetic complications: no    Darrin Nipper

## 2018-01-30 NOTE — H&P (Signed)
HISTORY AND PHYSICAL INTERVAL NOTE:  01/30/2018  11:36 AM  Molly Mitchell  has presented today for surgery, with the diagnosis of M20.41 Hammertoe of right foot.  The various methods of treatment have been discussed with the patient.  No guarantees were given.  After consideration of risks, benefits and other options for treatment, the patient has consented to surgery.  I have reviewed the patients' chart and labs.     A history and physical examination was performed in my office.  The patient was reexamined.  There have been no changes to this history and physical examination.  [name] A

## 2018-01-30 NOTE — Op Note (Signed)
Operative note   Surgeon:Lizzete Gough Lawyer: None    Preop diagnosis: Hammertoe right second and third toes 2 plantar displaced second and third metatarsals    Postop diagnosis: Same    Procedure:1 PIPJ arthrodesis right second toe 2.  PIPJ arthrodesis right third toe 3.  Weil osteotomy right second metatarsal. 4.  Weil osteotomy right third metatarsal    EBL: Minimal    Anesthesia:local and general    Hemostasis: Ankle tourniquet inflated to 200 mmHg for approximately 1 hour    Specimen: None    Complications: None    Operative indications:Molly Mitchell is an 73 y.o. that presents today for surgical intervention.  The risks/benefits/alternatives/complications have been discussed and consent has been given.    Procedure:  Patient was brought into the OR and placed on the operating table in thesupine position. After anesthesia was obtained theright lower extremity was prepped and draped in usual sterile fashion.  Patient brought into the OR placed operating table supine position.  General sedation was administered by the anesthesia team.  Local block consisting of a one-to-one mixture of 0.5% bupivacaine plain and Exparel long-acting anesthetic was used around all digits.  A total of 18 mL's was used.  The right lower extremity was then prepped and draped in usual sterile fashion.  Attention was directed to the second and third toes where a curvilinear incisions were placed from the PIPJ proximal to the MTPJ.  Sharp and blunt dissection down to the extensor tendons.  Next a tenotomy was performed at the PIPJ and the tendon was reflected proximally.  The head of the proximal phalanx and base of the middle phalanx was then exposed.  The head of the proximal phalanx and base of the middle phalanx were then removed with a surgical saw.  Attention was then directed to the metatarsophalangeal joints where a dorsomedial lateral capsulotomy was performed.  Next a Weil osteotomy was  created to the second and third metatarsals.  This allowed shortening of the second and third metatarsals.  Both were then stabilized with 2.0 mm headless compression screws from the mini monster set.  Good stability was noted.  Good alignment noted to the metatarsal parabola on fluoroscopy.  At this time 4 oh K wire was then driven from the base of the middle phalanx to the tip of the toe and driven proximal into the proximal phalanx.  Good compression at the PIPJ was noted to both the second and third toes.  Good alignment was noted in all planes on fluoroscopy.  Much better realignment was noted grossly.  Wounds were flushed with copious amounts of irrigation.  Layered closure was performed with a 3-0 Vicryl for the tendon and subcutaneous tissue and a 4-0 nylon for skin.    Patient tolerated the procedure and anesthesia well.  Was transported from the OR to the PACU with all vital signs stable and vascular status intact. To be discharged per routine protocol.  Will follow up in approximately 1 week in the outpatient clinic.

## 2018-01-30 NOTE — Anesthesia Procedure Notes (Signed)
Procedure Name: LMA Insertion Date/Time: 01/30/2018 12:19 PM Performed by: Cameron Ali, CRNA Pre-anesthesia Checklist: Patient identified, Emergency Drugs available, Suction available, Timeout performed and Patient being monitored Patient Re-evaluated:Patient Re-evaluated prior to induction Oxygen Delivery Method: Circle system utilized Preoxygenation: Pre-oxygenation with 100% oxygen Induction Type: IV induction LMA: LMA inserted LMA Size: 3.0 Number of attempts: 1 Placement Confirmation: positive ETCO2 Tube secured with: Tape Dental Injury: Teeth and Oropharynx as per pre-operative assessment

## 2018-01-30 NOTE — Transfer of Care (Signed)
Immediate Anesthesia Transfer of Care Note  Patient: Molly Mitchell  Procedure(s) Performed: HAMMER TOE CORRECTION SECOND AND THIRD (Right Foot) WEIL RIGHT 2ND AND 3RD (Right Foot)  Patient Location: PACU  Anesthesia Type: General  Level of Consciousness: awake, alert  and patient cooperative  Airway and Oxygen Therapy: Patient Spontanous Breathing and Patient connected to supplemental oxygen  Post-op Assessment: Post-op Vital signs reviewed, Patient's Cardiovascular Status Stable, Respiratory Function Stable, Patent Airway and No signs of Nausea or vomiting  Post-op Vital Signs: Reviewed and stable  Complications: No apparent anesthesia complications

## 2018-01-30 NOTE — Discharge Instructions (Signed)
[facility] °[facility] ° °POST OPERATIVE INSTRUCTIONS FOR DR. TROXLER AND DR. FOWLER °[facility] ° ° °1. Take your medication as prescribed.  Pain medication should be taken only as needed. ° °2. Keep the dressing clean, dry and intact. ° °3. Keep your foot elevated above the heart level for the first 48 hours. ° °4. Walking to the bathroom and brief periods of walking are acceptable, unless we have instructed you to be non-weight bearing. ° °5. Always wear your post-op shoe when walking.  Always use your crutches if you are to be non-weight bearing. ° °6. Do not take a shower. Baths are permissible as long as the foot is kept out of the water.  ° °7. Every hour you are awake:  °- Bend your knee 15 times. °- Flex foot 15 times °- Massage calf 15 times ° °8. Call [facility] ([phone]) if any of the following problems occur: °- You develop a temperature or fever. °- The bandage becomes saturated with blood. °- Medication does not stop your pain. °- Injury of the foot occurs. °- Any symptoms of infection including redness, odor, or red streaks running from wound. ° ° °General Anesthesia, Adult, Care After °This sheet gives you information about how to care for yourself after your procedure. Your health care provider may also give you more specific instructions. If you have problems or questions, contact your health care provider. °What can I expect after the procedure? °After the procedure, the following side effects are common: °· Pain or discomfort at the IV site. °· Nausea. °· Vomiting. °· Sore throat. °· Trouble concentrating. °· Feeling cold or chills. °· Weak or tired. °· Sleepiness and fatigue. °· Soreness and body aches. These side effects can affect parts of the body that were not involved in surgery. °Follow these instructions at home: ° °For at least 24 hours after the procedure: °· Have a responsible adult stay with you. It is important to  have someone help care for you until you are awake and alert. °· Rest as needed. °· Do not: °? Participate in activities in which you could fall or become injured. °? Drive. °? Use heavy machinery. °? Drink alcohol. °? Take sleeping pills or medicines that cause drowsiness. °? Make important decisions or sign legal documents. °? Take care of children on your own. °Eating and drinking °· Follow any instructions from your health care provider about eating or drinking restrictions. °· When you feel hungry, start by eating small amounts of foods that are soft and easy to digest (bland), such as toast. Gradually return to your regular diet. °· Drink enough fluid to keep your urine pale yellow. °· If you vomit, rehydrate by drinking water, juice, or clear broth. °General instructions °· If you have sleep apnea, surgery and certain medicines can increase your risk for breathing problems. Follow instructions from your health care provider about wearing your sleep device: °? Anytime you are sleeping, including during daytime naps. °? While taking prescription pain medicines, sleeping medicines, or medicines that make you drowsy. °· Return to your normal activities as told by your health care provider. Ask your health care provider what activities are safe for you. °· Take over-the-counter and prescription medicines only as told by your health care provider. °· If you smoke, do not smoke without supervision. °· Keep all follow-up visits as told by your health care provider. This is important. °Contact a health care provider if: °· You have   nausea or vomiting that does not get better with medicine.  You cannot eat or drink without vomiting.  You have pain that does not get better with medicine.  You are unable to pass urine.  You develop a skin rash.  You have a fever.  You have redness around your IV site that gets worse. Get help right away if:  You have difficulty breathing.  You have chest pain.  You  have blood in your urine or stool, or you vomit blood. Summary  After the procedure, it is common to have a sore throat or nausea. It is also common to feel tired.  Have a responsible adult stay with you for the first 24 hours after general anesthesia. It is important to have someone help care for you until you are awake and alert.  When you feel hungry, start by eating small amounts of foods that are soft and easy to digest (bland), such as toast. Gradually return to your regular diet.  Drink enough fluid to keep your urine pale yellow.  Return to your normal activities as told by your health care provider. Ask your health care provider what activities are safe for you. This information is not intended to replace advice given to you by your health care provider. Make sure you discuss any questions you have with your health care provider. Document Released: 04/10/2000 Document Revised: 08/18/2016 Document Reviewed: 08/18/2016 Elsevier Interactive Patient Education  2019 [employer] for Dis[employer] Teaching: EXPAREL (bupivacaine liposome injectable suspension)   Your surgeon or anesthesiologist gave you EXPAREL(bupivacaine) to help control your pain after surgery.   EXPAREL is a local anesthetic that provides pain relief by numbing the tissue around the surgical site.  EXPAREL is designed to release pain medication over time and can control pain for up to 72 hours.  Depending on how you respond to EXPAREL, you may require less pain medication during your recovery.  Possible side effects:  Temporary loss of sensation or ability to move in the area where bupivacaine was injected.  Nausea, vomiting, constipation  Rarely, numbness and tingling in your mouth or lips, lightheadedness, or anxiety may occur.  Call your doctor right away if you think you may be experiencing any of these sensations, or if you have other questions regarding possible side effects.  Follow all  other discharge instructions given to you by your surgeon or nurse. Eat a healthy diet and drink plenty of water or other fluids.  If you return to the hospital for any reason within 96 hours following the administration of EXPAREL, it is important for health care providers to know that you have received this anesthetic. A teal colored band has been placed on your arm with the date, time and amount of EXPAREL you have received in order to alert and inform your health care providers. Please leave this armband in place for the full 96 hours following administration, and then you may remove the band.

## 2018-01-31 ENCOUNTER — Encounter: Payer: Self-pay | Admitting: Podiatry

## 2018-02-05 DIAGNOSIS — M2041 Other hammer toe(s) (acquired), right foot: Secondary | ICD-10-CM | POA: Diagnosis not present

## 2018-02-25 ENCOUNTER — Other Ambulatory Visit: Payer: Self-pay | Admitting: Family Medicine

## 2018-02-25 DIAGNOSIS — L304 Erythema intertrigo: Secondary | ICD-10-CM

## 2018-02-26 DIAGNOSIS — M2041 Other hammer toe(s) (acquired), right foot: Secondary | ICD-10-CM | POA: Diagnosis not present

## 2018-02-28 ENCOUNTER — Other Ambulatory Visit: Payer: Self-pay | Admitting: Family Medicine

## 2018-02-28 NOTE — Telephone Encounter (Signed)
Refilled at [facility] on 12-17-17 for a year. Does she just need enough to last until the prescription come through the mail?

## 2018-03-05 NOTE — Telephone Encounter (Signed)
Patient states she did not request a refill for alendronate from [facility]. Patient stated she has asked the pharmacy several times to stop trying to refill this particular mediation. She will call [facility] again and asked to remove alendronate from their refill list.

## 2018-03-12 DIAGNOSIS — M2041 Other hammer toe(s) (acquired), right foot: Secondary | ICD-10-CM | POA: Diagnosis not present

## 2018-03-14 DIAGNOSIS — I10 Essential (primary) hypertension: Secondary | ICD-10-CM | POA: Diagnosis not present

## 2018-03-14 DIAGNOSIS — I340 Nonrheumatic mitral (valve) insufficiency: Secondary | ICD-10-CM | POA: Diagnosis not present

## 2018-03-14 DIAGNOSIS — E782 Mixed hyperlipidemia: Secondary | ICD-10-CM | POA: Diagnosis not present

## 2018-03-15 ENCOUNTER — Telehealth: Payer: Self-pay | Admitting: Family Medicine

## 2018-03-15 NOTE — Telephone Encounter (Signed)
Okay to send? Last ekg 02/01/2018.

## 2018-03-15 NOTE — Telephone Encounter (Signed)
Dr. Serafina Royals is needing the last ekg done on pt Please fax to [phone].  Thanks, [employer]

## 2018-03-15 NOTE — Telephone Encounter (Signed)
EKG faxed 

## 2018-03-15 NOTE — Telephone Encounter (Signed)
Yes

## 2018-05-14 DIAGNOSIS — M2041 Other hammer toe(s) (acquired), right foot: Secondary | ICD-10-CM | POA: Diagnosis not present

## 2018-06-14 DIAGNOSIS — M4712 Other spondylosis with myelopathy, cervical region: Secondary | ICD-10-CM | POA: Diagnosis not present

## 2018-06-14 DIAGNOSIS — M4316 Spondylolisthesis, lumbar region: Secondary | ICD-10-CM | POA: Diagnosis not present

## 2018-06-21 ENCOUNTER — Other Ambulatory Visit: Payer: Self-pay | Admitting: Family Medicine

## 2018-08-23 DIAGNOSIS — M25561 Pain in right knee: Secondary | ICD-10-CM | POA: Diagnosis not present

## 2018-08-23 DIAGNOSIS — M1711 Unilateral primary osteoarthritis, right knee: Secondary | ICD-10-CM | POA: Diagnosis not present

## 2018-08-23 DIAGNOSIS — M1712 Unilateral primary osteoarthritis, left knee: Secondary | ICD-10-CM | POA: Diagnosis not present

## 2018-08-23 DIAGNOSIS — Z96652 Presence of left artificial knee joint: Secondary | ICD-10-CM | POA: Diagnosis not present

## 2018-08-23 DIAGNOSIS — G8929 Other chronic pain: Secondary | ICD-10-CM | POA: Diagnosis not present

## 2018-09-02 DIAGNOSIS — H524 Presbyopia: Secondary | ICD-10-CM | POA: Diagnosis not present

## 2018-09-13 DIAGNOSIS — Z6829 Body mass index (BMI) 29.0-29.9, adult: Secondary | ICD-10-CM | POA: Diagnosis not present

## 2018-09-13 DIAGNOSIS — I10 Essential (primary) hypertension: Secondary | ICD-10-CM | POA: Diagnosis not present

## 2018-09-13 DIAGNOSIS — M4712 Other spondylosis with myelopathy, cervical region: Secondary | ICD-10-CM | POA: Diagnosis not present

## 2018-09-17 DIAGNOSIS — Z23 Encounter for immunization: Secondary | ICD-10-CM | POA: Diagnosis not present

## 2018-09-17 DIAGNOSIS — Z01818 Encounter for other preprocedural examination: Secondary | ICD-10-CM | POA: Diagnosis not present

## 2018-09-17 DIAGNOSIS — I340 Nonrheumatic mitral (valve) insufficiency: Secondary | ICD-10-CM | POA: Diagnosis not present

## 2018-09-17 DIAGNOSIS — E782 Mixed hyperlipidemia: Secondary | ICD-10-CM | POA: Diagnosis not present

## 2018-09-17 DIAGNOSIS — I10 Essential (primary) hypertension: Secondary | ICD-10-CM | POA: Diagnosis not present

## 2018-09-20 DIAGNOSIS — M4712 Other spondylosis with myelopathy, cervical region: Secondary | ICD-10-CM | POA: Diagnosis not present

## 2018-10-08 ENCOUNTER — Encounter: Payer: Medicare HMO | Admitting: Family Medicine

## 2018-10-08 ENCOUNTER — Ambulatory Visit: Payer: Medicare HMO

## 2018-10-14 ENCOUNTER — Other Ambulatory Visit: Payer: Self-pay | Admitting: Neurosurgery

## 2018-10-14 DIAGNOSIS — M4712 Other spondylosis with myelopathy, cervical region: Secondary | ICD-10-CM

## 2018-10-14 DIAGNOSIS — M4722 Other spondylosis with radiculopathy, cervical region: Secondary | ICD-10-CM

## 2018-10-26 ENCOUNTER — Ambulatory Visit
Admission: RE | Admit: 2018-10-26 | Discharge: 2018-10-26 | Disposition: A | Discharge status: Home / Self Care | Payer: Medicare HMO | Source: Ambulatory Visit | Attending: Neurosurgery | Admitting: Neurosurgery

## 2018-10-26 ENCOUNTER — Other Ambulatory Visit: Payer: Self-pay

## 2018-10-26 DIAGNOSIS — M4712 Other spondylosis with myelopathy, cervical region: Secondary | ICD-10-CM

## 2018-10-26 DIAGNOSIS — M4802 Spinal stenosis, cervical region: Secondary | ICD-10-CM | POA: Diagnosis not present

## 2018-10-29 DIAGNOSIS — Z1159 Encounter for screening for other viral diseases: Secondary | ICD-10-CM | POA: Diagnosis not present

## 2018-11-04 DIAGNOSIS — M4712 Other spondylosis with myelopathy, cervical region: Secondary | ICD-10-CM | POA: Diagnosis not present

## 2018-11-04 DIAGNOSIS — M4802 Spinal stenosis, cervical region: Secondary | ICD-10-CM | POA: Diagnosis not present

## 2018-11-04 DIAGNOSIS — Z981 Arthrodesis status: Secondary | ICD-10-CM | POA: Diagnosis not present

## 2018-11-10 ENCOUNTER — Telehealth: Payer: Self-pay | Admitting: Family Medicine

## 2018-11-14 ENCOUNTER — Other Ambulatory Visit: Payer: Self-pay | Admitting: Family Medicine

## 2018-11-14 MED ORDER — ALENDRONATE SODIUM 70 MG PO TABS: ORAL_TABLET | ORAL | 3 refills | Status: DC

## 2018-11-15 ENCOUNTER — Other Ambulatory Visit: Payer: Self-pay | Admitting: Family Medicine

## 2018-11-15 MED ORDER — ALENDRONATE SODIUM 70 MG PO TABS: ORAL_TABLET | ORAL | 3 refills | Status: DC

## 2018-11-20 DIAGNOSIS — M4712 Other spondylosis with myelopathy, cervical region: Secondary | ICD-10-CM | POA: Diagnosis not present

## 2018-12-01 ENCOUNTER — Encounter: Payer: Self-pay | Admitting: Family Medicine

## 2018-12-07 DIAGNOSIS — Z20828 Contact with and (suspected) exposure to other viral communicable diseases: Secondary | ICD-10-CM | POA: Diagnosis not present

## 2018-12-07 DIAGNOSIS — Z8616 Personal history of COVID-19: Secondary | ICD-10-CM

## 2018-12-07 HISTORY — DX: Personal history of COVID-19: Z86.16

## 2018-12-09 ENCOUNTER — Encounter: Payer: Self-pay | Admitting: Family Medicine

## 2019-01-15 DIAGNOSIS — D0439 Carcinoma in situ of skin of other parts of face: Secondary | ICD-10-CM | POA: Diagnosis not present

## 2019-01-15 DIAGNOSIS — L570 Actinic keratosis: Secondary | ICD-10-CM | POA: Diagnosis not present

## 2019-01-15 DIAGNOSIS — Z85828 Personal history of other malignant neoplasm of skin: Secondary | ICD-10-CM | POA: Diagnosis not present

## 2019-01-15 DIAGNOSIS — L821 Other seborrheic keratosis: Secondary | ICD-10-CM | POA: Diagnosis not present

## 2019-01-15 DIAGNOSIS — D2262 Melanocytic nevi of left upper limb, including shoulder: Secondary | ICD-10-CM | POA: Diagnosis not present

## 2019-01-15 DIAGNOSIS — D2261 Melanocytic nevi of right upper limb, including shoulder: Secondary | ICD-10-CM | POA: Diagnosis not present

## 2019-01-15 DIAGNOSIS — X32XXXA Exposure to sunlight, initial encounter: Secondary | ICD-10-CM | POA: Diagnosis not present

## 2019-01-15 DIAGNOSIS — D485 Neoplasm of uncertain behavior of skin: Secondary | ICD-10-CM | POA: Diagnosis not present

## 2019-01-17 DIAGNOSIS — U071 COVID-19: Secondary | ICD-10-CM

## 2019-01-17 HISTORY — DX: COVID-19: U07.1

## 2019-02-18 ENCOUNTER — Other Ambulatory Visit: Payer: Self-pay | Admitting: Podiatry

## 2019-02-18 DIAGNOSIS — M2042 Other hammer toe(s) (acquired), left foot: Secondary | ICD-10-CM | POA: Diagnosis not present

## 2019-02-20 DIAGNOSIS — D0439 Carcinoma in situ of skin of other parts of face: Secondary | ICD-10-CM | POA: Diagnosis not present

## 2019-02-26 ENCOUNTER — Encounter: Payer: Self-pay | Admitting: Podiatry

## 2019-03-03 ENCOUNTER — Other Ambulatory Visit
Admission: RE | Admit: 2019-03-03 | Discharge: 2019-03-03 | Disposition: A | Discharge status: Home / Self Care | Payer: Medicare HMO | Source: Ambulatory Visit | Attending: Podiatry | Admitting: Podiatry

## 2019-03-03 ENCOUNTER — Other Ambulatory Visit: Payer: Self-pay

## 2019-03-03 DIAGNOSIS — Z20822 Contact with and (suspected) exposure to covid-19: Secondary | ICD-10-CM | POA: Diagnosis not present

## 2019-03-03 DIAGNOSIS — Z01812 Encounter for preprocedural laboratory examination: Secondary | ICD-10-CM | POA: Insufficient documentation

## 2019-03-04 LAB — SARS CORONAVIRUS 2 (TAT 6-24 HRS): SARS Coronavirus 2: NEGATIVE

## 2019-03-05 ENCOUNTER — Encounter: Payer: Self-pay | Admitting: Podiatry

## 2019-03-05 ENCOUNTER — Other Ambulatory Visit: Payer: Self-pay

## 2019-03-05 ENCOUNTER — Ambulatory Visit: Payer: Medicare HMO | Admitting: Anesthesiology

## 2019-03-05 ENCOUNTER — Encounter
Admission: RE | Disposition: A | Discharge status: Home / Self Care | Payer: Self-pay | Source: Home / Self Care | Attending: Podiatry

## 2019-03-05 ENCOUNTER — Ambulatory Visit
Admission: RE | Admit: 2019-03-05 | Discharge: 2019-03-05 | Disposition: A | Discharge status: Home / Self Care | Payer: Medicare HMO | Attending: Podiatry | Admitting: Podiatry

## 2019-03-05 DIAGNOSIS — I10 Essential (primary) hypertension: Secondary | ICD-10-CM | POA: Diagnosis not present

## 2019-03-05 DIAGNOSIS — E669 Obesity, unspecified: Secondary | ICD-10-CM | POA: Diagnosis not present

## 2019-03-05 DIAGNOSIS — Z85828 Personal history of other malignant neoplasm of skin: Secondary | ICD-10-CM | POA: Insufficient documentation

## 2019-03-05 DIAGNOSIS — M205X2 Other deformities of toe(s) (acquired), left foot: Secondary | ICD-10-CM | POA: Diagnosis not present

## 2019-03-05 DIAGNOSIS — Z791 Long term (current) use of non-steroidal anti-inflammatories (NSAID): Secondary | ICD-10-CM | POA: Insufficient documentation

## 2019-03-05 DIAGNOSIS — Z96652 Presence of left artificial knee joint: Secondary | ICD-10-CM | POA: Diagnosis not present

## 2019-03-05 DIAGNOSIS — Z87891 Personal history of nicotine dependence: Secondary | ICD-10-CM | POA: Insufficient documentation

## 2019-03-05 DIAGNOSIS — M2042 Other hammer toe(s) (acquired), left foot: Secondary | ICD-10-CM | POA: Insufficient documentation

## 2019-03-05 DIAGNOSIS — Z9889 Other specified postprocedural states: Secondary | ICD-10-CM | POA: Insufficient documentation

## 2019-03-05 DIAGNOSIS — M24575 Contracture, left foot: Secondary | ICD-10-CM | POA: Insufficient documentation

## 2019-03-05 DIAGNOSIS — Z6829 Body mass index (BMI) 29.0-29.9, adult: Secondary | ICD-10-CM | POA: Insufficient documentation

## 2019-03-05 DIAGNOSIS — Z79899 Other long term (current) drug therapy: Secondary | ICD-10-CM | POA: Insufficient documentation

## 2019-03-05 DIAGNOSIS — E782 Mixed hyperlipidemia: Secondary | ICD-10-CM | POA: Diagnosis not present

## 2019-03-05 HISTORY — PX: HAMMER TOE SURGERY: SHX385

## 2019-03-05 HISTORY — PX: WEIL OSTEOTOMY: SHX5044

## 2019-03-05 HISTORY — DX: Myoneural disorder, unspecified: G70.9

## 2019-03-05 SURGERY — OSTEOTOMY, WEIL
Anesthesia: General | Site: Toe | Laterality: Left

## 2019-03-05 MED ORDER — FENTANYL CITRATE (PF) 100 MCG/2ML IJ SOLN
INTRAMUSCULAR | Status: DC | PRN
  Administered 2019-03-05: 50 ug via INTRAVENOUS

## 2019-03-05 MED ORDER — OXYCODONE HCL 5 MG/5ML PO SOLN: 5.0000 mg | Freq: Once | ORAL | Status: DC | PRN

## 2019-03-05 MED ORDER — LACTATED RINGERS IV SOLN: INTRAVENOUS | Status: DC

## 2019-03-05 MED ORDER — GLYCOPYRROLATE 0.2 MG/ML IJ SOLN
INTRAMUSCULAR | Status: DC | PRN
  Administered 2019-03-05: .1 mg via INTRAVENOUS

## 2019-03-05 MED ORDER — ACETAMINOPHEN 160 MG/5ML PO SOLN: 325.0000 mg | ORAL | Status: DC | PRN

## 2019-03-05 MED ORDER — OXYCODONE-ACETAMINOPHEN 5-325 MG PO TABS: 1.0000 | ORAL_TABLET | ORAL | 0 refills | Status: DC | PRN

## 2019-03-05 MED ORDER — EPHEDRINE SULFATE 50 MG/ML IJ SOLN
INTRAMUSCULAR | Status: DC | PRN
  Administered 2019-03-05 (×3): 5 mg via INTRAVENOUS
  Administered 2019-03-05: 10 mg via INTRAVENOUS

## 2019-03-05 MED ORDER — OXYCODONE HCL 5 MG PO TABS: 5.0000 mg | ORAL_TABLET | Freq: Once | ORAL | Status: DC | PRN

## 2019-03-05 MED ORDER — ACETAMINOPHEN 325 MG PO TABS: 325.0000 mg | ORAL_TABLET | ORAL | Status: DC | PRN

## 2019-03-05 MED ORDER — MIDAZOLAM HCL 5 MG/5ML IJ SOLN
INTRAMUSCULAR | Status: DC | PRN
  Administered 2019-03-05: 2 mg via INTRAVENOUS

## 2019-03-05 MED ORDER — PROPOFOL 10 MG/ML IV BOLUS
INTRAVENOUS | Status: DC | PRN
  Administered 2019-03-05: 140 mg via INTRAVENOUS

## 2019-03-05 MED ORDER — POVIDONE-IODINE 7.5 % EX SOLN: Freq: Once | CUTANEOUS | Status: AC

## 2019-03-05 MED ORDER — DEXAMETHASONE SODIUM PHOSPHATE 4 MG/ML IJ SOLN
INTRAMUSCULAR | Status: DC | PRN
  Administered 2019-03-05: 4 mg via INTRAVENOUS

## 2019-03-05 MED ORDER — ONDANSETRON HCL 4 MG/2ML IJ SOLN
INTRAMUSCULAR | Status: DC | PRN
  Administered 2019-03-05: 4 mg via INTRAVENOUS

## 2019-03-05 MED ORDER — BUPIVACAINE LIPOSOME 1.3 % IJ SUSP
INTRAMUSCULAR | Status: DC | PRN
  Administered 2019-03-05: 10 mL
  Administered 2019-03-05: 9 mL

## 2019-03-05 MED ORDER — FENTANYL CITRATE (PF) 100 MCG/2ML IJ SOLN: 25.0000 ug | INTRAMUSCULAR | Status: DC | PRN

## 2019-03-05 MED ORDER — BUPIVACAINE HCL (PF) 0.25 % IJ SOLN
INTRAMUSCULAR | Status: DC | PRN
  Administered 2019-03-05: 9 mL

## 2019-03-05 MED ORDER — LIDOCAINE HCL (CARDIAC) PF 100 MG/5ML IV SOSY
PREFILLED_SYRINGE | INTRAVENOUS | Status: DC | PRN
  Administered 2019-03-05: 40 mg via INTRATRACHEAL

## 2019-03-05 MED ORDER — CLINDAMYCIN PHOSPHATE 900 MG/50ML IV SOLN
900.0000 mg | INTRAVENOUS | Status: AC
  Administered 2019-03-05: 900 mg via INTRAVENOUS

## 2019-03-05 SURGICAL SUPPLY — 45 items
BENZOIN TINCTURE PRP APPL 2/3 (GAUZE/BANDAGES/DRESSINGS) ×3 IMPLANT
BIT DRILL CANN 1.7 (BIT) ×3 IMPLANT
BLADE MINI RND TIP GREEN BEAV (BLADE) ×3 IMPLANT
BLADE OSC/SAGITTAL 5.5X25 (BLADE) ×3 IMPLANT
BLADE OSC/SAGITTAL MD 5.5X18 (BLADE) ×3 IMPLANT
BLADE OSCILLATING/SAGITTAL (BLADE) ×1
BLADE SW THK.38XMED LNG THN (BLADE) ×2 IMPLANT
BNDG COHESIVE 4X5 TAN STRL (GAUZE/BANDAGES/DRESSINGS) ×3 IMPLANT
BNDG ELASTIC 4X5.8 VLCR NS LF (GAUZE/BANDAGES/DRESSINGS) ×3 IMPLANT
BNDG ESMARK 4X12 TAN STRL LF (GAUZE/BANDAGES/DRESSINGS) ×3 IMPLANT
BNDG GAUZE 4.5X4.1 6PLY STRL (MISCELLANEOUS) ×3 IMPLANT
BNDG STRETCH 4X75 STRL LF (GAUZE/BANDAGES/DRESSINGS) ×3 IMPLANT
CANISTER SUCT 1200ML W/VALVE (MISCELLANEOUS) ×3 IMPLANT
CNTRSNK DRL 2.5X2X ×2 IMPLANT
COUNTERSINK 2.5 ×1 IMPLANT
COVER LIGHT HANDLE UNIVERSAL (MISCELLANEOUS) ×6 IMPLANT
CUFF TOURN SGL QUICK 18X4 (TOURNIQUET CUFF) ×3 IMPLANT
DRAPE FLUOR MINI C-ARM 54X84 (DRAPES) ×3 IMPLANT
DURAPREP 26ML APPLICATOR (WOUND CARE) ×3 IMPLANT
ELECT REM PT RETURN 9FT ADLT (ELECTROSURGICAL) ×3
ELECTRODE REM PT RTRN 9FT ADLT (ELECTROSURGICAL) ×2 IMPLANT
GAUZE SPONGE 4X4 12PLY STRL (GAUZE/BANDAGES/DRESSINGS) ×3 IMPLANT
GAUZE XEROFORM 1X8 LF (GAUZE/BANDAGES/DRESSINGS) ×3 IMPLANT
GLOVE BIO SURGEON STRL SZ7.5 (GLOVE) ×6 IMPLANT
GLOVE INDICATOR 8.0 STRL GRN (GLOVE) ×6 IMPLANT
GOWN STRL REUS W/ TWL LRG LVL3 (GOWN DISPOSABLE) ×4 IMPLANT
GOWN STRL REUS W/TWL LRG LVL3 (GOWN DISPOSABLE) ×2
GUIDEWIRE .9 (WIRE) ×6 IMPLANT
K-WIRE DBL END TROCAR 6X.045 (WIRE) ×3
K-Wire 1.1 mm (0.045") x 152 mm (6") ×6 IMPLANT
KIT TURNOVER KIT A (KITS) ×3 IMPLANT
KWIRE DBL END TROCAR 6X.045 (WIRE) ×2 IMPLANT
NS IRRIG 500ML POUR BTL (IV SOLUTION) ×3 IMPLANT
PACK EXTREMITY ARMC (MISCELLANEOUS) ×3 IMPLANT
PENCIL SMOKE EVACUATOR (MISCELLANEOUS) ×3 IMPLANT
PIN BALLS 3/8 F/.045 WIRE (MISCELLANEOUS) ×6 IMPLANT
RASP SM TEAR CROSS CUT (RASP) ×3 IMPLANT
SCREW HEAD 12X2XCANN CLR CD FT (Screw) ×4 IMPLANT
SCREW HEADED 2.0X12 (Screw) ×2 IMPLANT
STOCKINETTE IMPERVIOUS LG (DRAPES) ×3 IMPLANT
STRIP CLOSURE SKIN 1/4X4 (GAUZE/BANDAGES/DRESSINGS) ×3 IMPLANT
SUT ETHILON 4 0 FS (SUTURE) ×6 IMPLANT
SUT VIC AB 4-0 SH 27 (SUTURE) ×1
SUT VIC AB 4-0 SH 27XANBCTRL (SUTURE) ×2 IMPLANT
SUT VICRYL AB 3-0 FS1 BRD 27IN (SUTURE) ×3 IMPLANT

## 2019-03-05 NOTE — Anesthesia Procedure Notes (Signed)
Procedure Name: LMA Insertion Date/Time: 03/05/2019 12:21 PM Performed by: Mayme Genta, CRNA Pre-anesthesia Checklist: Patient identified, Emergency Drugs available, Suction available, Timeout performed and Patient being monitored Patient Re-evaluated:Patient Re-evaluated prior to induction Oxygen Delivery Method: Circle system utilized Preoxygenation: Pre-oxygenation with 100% oxygen Induction Type: IV induction LMA: LMA inserted LMA Size: 3.0 Number of attempts: 1 Placement Confirmation: positive ETCO2 and breath sounds checked- equal and bilateral Tube secured with: Tape

## 2019-03-05 NOTE — Op Note (Signed)
Operative note   Surgeon:Darrien Laakso Lawyer: None    Preop diagnosis: 1.  Hammertoe left second toe 2.  Hammertoe left third toe 3.  Contracted left second MTPJ 4.  Contracted left third MTPJ    Postop diagnosis: Same    Procedure: 1.  PIPJ arthrodesis left second toe 2.  PIPJ arthrodesis left third toe 3.  Weil osteotomy left second metatarsal 4.  Weil osteotomy left third metatarsal    EBL: Minimal    Anesthesia:local and general    Hemostasis: Ankle tourniquet inflated to 200 mmHg for approximately 40 minutes    Specimen: None    Complications: None    Operative indications:Molly Mitchell is an 74 y.o. that presents today for surgical intervention.  The risks/benefits/alternatives/complications have been discussed and consent has been given.    Procedure:  Patient was brought into the OR and placed on the operating table in thesupine position. After anesthesia was obtained theleft lower extremity was prepped and draped in usual sterile fashion.  Attention was directed to the left second and third toes where a curvilinear incision was performed from the PIPJ of the toe to the metatarsophalangeal joint.  Sharp and blunt dissection carried down to the long extensor tendons of the second and third toes.  The PIPJ was then entered and the extensor tendon was retracted proximal to the metatarsophalangeal joint.  The PIPJ was then released.  At this time a dorsal medial and lateral capsulotomy of the metatarsophalangeal joint was performed to the second and third metatarsophalangeal joints.  This exposed the head of the metatarsal of the second and third.  Next a Weil osteotomy was then created to the second and third metatarsal heads.  The capital fragment was translocated proximal.  A 2.0 mm cannulated screw was driven from dorsal to plantar to stabilize the metatarsal.  Good realignment of the MTPJ was noted.  The head of the proximal phalanx and base of the middle phalanx of the  second and third toes were then removed with a power saw.  0.045 K wire was driven through the base of the middle phalanx through the tip of the toe and then retrograded back to the proximal phalanx.  This was performed on both the second and third toes.  Good realignment was noted to all areas.  All wounds were flushed with copious amounts of irrigation.  A 3-0 Vicryl was used to reanastomose the extensor tendon.  4-0 Vicryl for the subcutaneous tissue was used and a 4-0 nylon for skin.    Patient tolerated the procedure and anesthesia well.  Was transported from the OR to the PACU with all vital signs stable and vascular status intact. To be discharged per routine protocol.  Will follow up in approximately 1 week in the outpatient clinic.  A prescription for Percocet was sent to her pharmacy.

## 2019-03-05 NOTE — H&P (Signed)
HISTORY AND PHYSICAL INTERVAL NOTE:  03/05/2019  12:07 PM  Molly Mitchell  has presented today for surgery, with the diagnosis of M20.42  ACQUIRED HAMMERTOE LEFT FOOT.  The various methods of treatment have been discussed with the patient.  No guarantees were given.  After consideration of risks, benefits and other options for treatment, the patient has consented to surgery.  I have reviewed the patients' chart and labs.     A history and physical examination was performed in my office.  The patient was reexamined.  There have been no changes to this history and physical examination.  Molly Mitchell A

## 2019-03-05 NOTE — Transfer of Care (Signed)
Immediate Anesthesia Transfer of Care Note  Patient: Molly Mitchell  Procedure(s) Performed: Malka So OSTEOTOMY X 2 LEFT (Left Foot) HAMMER TOE CORRECTION T1 AND T2 (Left Toe)  Patient Location: PACU  Anesthesia Type: General  Level of Consciousness: awake, alert  and patient cooperative  Airway and Oxygen Therapy: Patient Spontanous Breathing and Patient connected to supplemental oxygen  Post-op Assessment: Post-op Vital signs reviewed, Patient's Cardiovascular Status Stable, Respiratory Function Stable, Patent Airway and No signs of Nausea or vomiting  Post-op Vital Signs: Reviewed and stable  Complications: No apparent anesthesia complications

## 2019-03-05 NOTE — Discharge Instructions (Signed)
[facility]  POST OPERATIVE INSTRUCTIONS FOR DR. TROXLER, DR. FOWLER, AND DR. [facility] PODIAT[facility]PART[facility]   1. Take your medication as prescribed.  Pain medication should be taken only as needed.  2. Keep the dressing clean, dry and intact.  3. Keep your foot elevated above the heart level for the first 48 hours.  4. Walking to the bathroom and brief periods of walking are acceptable, unless we have instructed you to be non-weight bearing.  5. Always wear your post-op shoe when walking.  Always use your crutches if you are to be non-weight bearing.  6. Do not take a shower. Baths are permissible as long as the foot is kept out of the water.   7. Every hour you are awake:  - Bend your knee 15 times. - Flex foot 15 times - Massage calf 15 times  8. Call [facility] ([phone]) if any of the following problems occur: - You develop a temperature or fever. - The bandage becomes saturated with blood. - Medication does not stop your pain. - Injury of the foot occurs. - Any symptoms of infection including redness, odor, or red streaks running from wound.  Information for Discharge Teaching: EXPAREL (bupivacaine liposome injectable suspension)   Your surgeon or anesthesiologist gave you EXPAREL(bupivacaine) to help control your pain after surgery.   EXPAREL is a local anesthetic that provides pain relief by numbing the tissue around the surgical site.  EXPAREL is designed to release pain medication over time and can control pain for up to 72 hours.  Depending on how you respond to EXPAREL, you may require less pain medication during your recovery.  Possible side effects:  Temporary loss of sensation or ability to move in the area where bupivacaine was injected.  Nausea, vomiting, constipation  Rarely, numbness and tingling in your mouth or lips, lightheadedness, or anxiety may occur.  Call your doctor right away  if you think you may be experiencing any of these sensations, or if you have other questions regarding possible side effects.  Follow all other discharge instructions given to you by your surgeon or nurse. Eat a healthy diet and drink plenty of water or other fluids.  If you return to the hospital for any reason within 96 hours following the administration of EXPAREL, it is important for health care providers to know that you have received this anesthetic. A teal colored band has been placed on your arm with the date, time and amount of EXPAREL you have received in order to alert and inform your health care providers. Please leave this armband in place for the full 96 hours following administration, and then you may remove the band.  General Anesthesia, Adult, Care After This sheet gives you information about how to care for yourself after your procedure. Your health care provider may also give you more specific instructions. If you have problems or questions, contact your health care provider. What can I expect after the procedure? After the procedure, the following side effects are common:  Pain or discomfort at the IV site.  Nausea.  Vomiting.  Sore throat.  Trouble concentrating.  Feeling cold or chills.  Weak or tired.  Sleepiness and fatigue.  Soreness and body aches. These side effects can affect parts of the body that were not involved in surgery. Follow these instructions at home:  For at least 24 hours after the procedure:  Have a responsible adult stay with you. It is important to   have someone help care for you until you are awake and alert.  Rest as needed.  Do not: ? Participate in activities in which you could fall or become injured. ? Drive. ? Use heavy machinery. ? Drink alcohol. ? Take sleeping pills or medicines that cause drowsiness. ? Make important decisions or sign legal documents. ? Take care of children on your own. Eating and drinking  Follow any  instructions from your health care provider about eating or drinking restrictions.  When you feel hungry, start by eating small amounts of foods that are soft and easy to digest (bland), such as toast. Gradually return to your regular diet.  Drink enough fluid to keep your urine pale yellow.  If you vomit, rehydrate by drinking water, juice, or clear broth. General instructions  If you have sleep apnea, surgery and certain medicines can increase your risk for breathing problems. Follow instructions from your health care provider about wearing your sleep device: ? Anytime you are sleeping, including during daytime naps. ? While taking prescription pain medicines, sleeping medicines, or medicines that make you drowsy.  Return to your normal activities as told by your health care provider. Ask your health care provider what activities are safe for you.  Take over-the-counter and prescription medicines only as told by your health care provider.  If you smoke, do not smoke without supervision.  Keep all follow-up visits as told by your health care provider. This is important. Contact a health care provider if:  You have nausea or vomiting that does not get better with medicine.  You cannot eat or drink without vomiting.  You have pain that does not get better with medicine.  You are unable to pass urine.  You develop a skin rash.  You have a fever.  You have redness around your IV site that gets worse. Get help right away if:  You have difficulty breathing.  You have chest pain.  You have blood in your urine or stool, or you vomit blood. Summary  After the procedure, it is common to have a sore throat or nausea. It is also common to feel tired.  Have a responsible adult stay with you for the first 24 hours after general anesthesia. It is important to have someone help care for you until you are awake and alert.  When you feel hungry, start by eating small amounts of foods  that are soft and easy to digest (bland), such as toast. Gradually return to your regular diet.  Drink enough fluid to keep your urine pale yellow.  Return to your normal activities as told by your health care provider. Ask your health care provider what activities are safe for you. This information is not intended to replace advice given to you by your health care provider. Make sure you discuss any questions you have with your health care provider. Document Revised: 01/05/2017 Document Reviewed: 08/18/2016 Elsevier Patient Education  2020 [employer]vier Inc[employer]  

## 2019-03-05 NOTE — Anesthesia Postprocedure Evaluation (Signed)
Anesthesia Post Note  Patient: [name]  Procedure(s) Performed: Malka So OSTEOTOMY X 2 LEFT (Left Foot) HAMMER TOE CORRECTION T1 AND T2 (Left Toe)     Patient location during evaluation: PACU Anesthesia Type: General Level of consciousness: awake and alert Pain management: pain level controlled Vital Signs Assessment: post-procedure vital signs reviewed and stable Respiratory status: spontaneous breathing, nonlabored ventilation, respiratory function stable and patient connected to nasal cannula oxygen Cardiovascular status: blood pressure returned to baseline and stable Postop Assessment: no apparent nausea or vomiting Anesthetic complications: no    Wanda Plump Jjesus Dingley

## 2019-03-05 NOTE — Anesthesia Preprocedure Evaluation (Signed)
Anesthesia Evaluation   Patient awake    Reviewed: Allergy & Precautions, NPO status , Patient's Chart, lab work & pertinent test results  Airway Mallampati: II  TM Distance: >3 FB Neck ROM: Full    Dental   Pulmonary asthma , former smoker,    Pulmonary exam normal        Cardiovascular hypertension, Normal cardiovascular exam     Neuro/Psych    GI/Hepatic   Endo/Other    Renal/GU CRFRenal disease     Musculoskeletal  (+) Arthritis ,   Abdominal   Peds  Hematology   Anesthesia Other Findings   Reproductive/Obstetrics                             Anesthesia Physical Anesthesia Plan  ASA: II  Anesthesia Plan: General   Post-op Pain Management:    Induction: Intravenous  PONV Risk Score and Plan: 2 and Ondansetron  Airway Management Planned: LMA  Additional Equipment:   Intra-op Plan:   Post-operative Plan: Extubation in OR  Informed Consent: I have reviewed the patients History and Physical, chart, labs and discussed the procedure including the risks, benefits and alternatives for the proposed anesthesia with the patient or authorized representative who has indicated his/her understanding and acceptance.       Plan Discussed with: CRNA, Anesthesiologist and Surgeon  Anesthesia Plan Comments:         Anesthesia Quick Evaluation

## 2019-03-06 ENCOUNTER — Encounter: Payer: Self-pay | Admitting: *Deleted

## 2019-03-10 ENCOUNTER — Other Ambulatory Visit: Payer: Self-pay | Admitting: Family Medicine

## 2019-03-10 NOTE — Progress Notes (Addendum)
Subjective:   Molly Mitchell is a 74 y.o. female who presents for Medicare Annual (Subsequent) preventive examination.    This visit is being conducted through telemedicine due to the COVID-19 pandemic. This patient has given me verbal consent via doximity to conduct this visit, patient states they are participating from their home address. Some vital signs may be absent or patient reported.    Patient identification: identified by name, DOB, and current address  Review of Systems:  N/A        Objective:     Vitals: There were no vitals taken for this visit.  There is no height or weight on file to calculate BMI. Unable to obtain vitals due to visit being conducted via telephonically.   Advanced Directives 03/05/2019 01/30/2018 10/02/2017 03/29/2017 12/18/2016 12/14/2016 09/22/2016  Does Patient Have a Medical Advance Directive? Yes Yes Yes Yes Yes Yes Yes  Type of Paramedic of [place];Living will [employer];Living will [place];Living will Living will [place];Living will [place];Living will [place];Living will  Does patient want to make changes to medical advance directive? No - Patient declined No - Patient declined - No - Patient declined No - Patient declined No - Patient declined -  Copy of [place] in Chart? No - copy requested No - copy requested Yes No - copy requested Yes Yes No - copy requested  Would patient like information on creating a medical advance directive? 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 ([place]) 09/07/2017   Past Surgical History:  Procedure Laterality Date   ABDOMINAL HYSTERECTOMY  1981   AIKEN OSTEOTOMY Right 01/30/2018   Procedure: WEIL RIGHT 2ND AND 3RD;  Surgeon: [name], DPM;  Location: [facility];  Service: Podiatry;  Laterality: Right;   CORRECTION HAMMER TOE     HAMMER TOE SURGERY Right 01/30/2018   Procedure: HAMMER TOE CORRECTION SECOND AND THIRD;  Surgeon: [name], DPM;  Location: [facility];  Service: Podiatry;  Laterality: Right;  GENERAL WITH LOCAL   HAMMER TOE SURGERY Left 03/05/2019   Procedure: HAMMER TOE CORRECTION T1 AND T2;  Surgeon: [name], DPM;  Location: [facility];  Service: Podiatry;  Laterality: Left;   JOINT REPLACEMENT Left 03/03/2013   MRI     ROTATOR CUFF REPAIR Right 1981   SPINAL FUSION  2001   lower back    SPINAL FUSION  2003   neck   TOTAL KNEE REVISION Left 07/31/2016   Procedure: TOTAL KNEE REVISION;  Surgeon: Dereck Leep, MD;  Location: [facility];  Service: Orthopedics;  Laterality: Left;   WEIL OSTEOTOMY Left 03/05/2019   Procedure: WEIL OSTEOTOMY X 2 LEFT;  Surgeon: [name], DPM;  Location: [facility];  Service: Podiatry;  Laterality: Left;   Family History  Problem Relation Age of Onset   Hypertension Brother    Hypertension Mother    Hypertension Sister    Cancer Sister    Diabetes Paternal Grandmother    Heart failure Maternal Grandmother    Social History   Socioeconomic History   Marital status: Married    Spouse name: Not on file   Number of children: 2   Years of education: Not on file   Highest education level: High school graduate  Occupational History   Occupation: retired  Tobacco Use   Smoking status: Former Smoker    Quit date: 04/17/1979    Years since quitting: 39.9   Smokeless tobacco: Never Used  Substance  and Sexual Activity   Alcohol use: No    Alcohol/week: 0.0 standard drinks   Drug use: No   Sexual activity: Not on file  Other Topics Concern   Not on file  Social History Narrative   Not on file   Social Determinants of Health   Financial Resource Strain:    Difficulty of Paying Living Expenses: Not on file  Food Insecurity:    Worried About Charity fundraiser in the Last Year: Not on file   [place] in the Last Year: Not on file  Transportation Needs:    Lack of Transportation (Medical): Not on file   Lack of Transportation (Non-Medical): Not on file  Physical Activity:    Days of Exercise per Week: Not on file   Minutes of Exercise per Session: Not on file  Stress:    Feeling of Stress : Not on file  Social Connections:    Frequency of Communication with Friends and Family: Not on file   Frequency of Social Gatherings with Friends and Family: Not on file   Attends Religious Services: Not on file   Active Member of Clubs or Organizations: Not on file   Attends Archivist Meetings: Not on file   Marital Status: Not on file    Outpatient Encounter Medications as of 03/11/2019  Medication Sig   amoxicillin (AMOXIL) 500 MG capsule Take 4 tablets by mouth 1 hour prior to dental procedures.   Ascorbic Acid (VITAMIN C) 1000 MG tablet Take 1,000 mg by mouth daily.    atenolol (TENORMIN) 50 MG tablet Take 50 mg by mouth daily.    B Complex Vitamins (VITAMIN B COMPLEX PO) Take by mouth.   Calcium-Magnesium 500-250 MG TABS Take 2 tablets by mouth daily.   Cholecalciferol (VITAMIN D3) 1000 UNITS CAPS Take 1,000 Units by mouth daily.    meloxicam (MOBIC) 15 MG tablet TAKE 1 TABLET EVERY DAY   Milk Thistle 150 MG CAPS Take 150 mg by mouth.   Multiple Vitamins-Minerals (MULTIVITAMIN WITH MINERALS) tablet Take 3 tablets by mouth daily.   naproxen sodium (ALEVE) 220 MG tablet Take 220 mg by mouth daily as needed.    nystatin-triamcinolone ointment (MYCOLOG) USE 1  TOPICAL APPLICATION TWICE DAILY (Patient taking differently: as needed. )   oxyCODONE-acetaminophen (PERCOCET) 5-325 MG tablet Take 1 tablet by mouth every 4 (four) hours as needed for severe pain.   pravastatin (PRAVACHOL) 40 MG tablet Take 40 mg by mouth at bedtime.   vitamin E 400 UNIT capsule Take 400 Units by mouth daily.    No facility-administered encounter medications on file as of 03/11/2019.    Activities of Daily Living In your present state of health, do you have any difficulty performing the following activities: 03/05/2019  Hearing? N  Vision? N  Difficulty concentrating or making decisions? N  Walking or climbing stairs? N  Dressing or bathing? N  Some recent data might be hidden    Patient Care Team: Chrismon, Vickki Muff, PA as PCP - General (Physician Assistant) Bryson Ha, OD as Consulting Physician (Optometry) Hooten, Laurice Record, MD as Consulting Physician (Orthopedic Surgery) Corey Skains, MD as Consulting Physician (Cardiology) Newman Pies, MD as Consulting Physician (Neurosurgery)    Assessment:   This is a routine wellness examination for Vollie.  Exercise Activities and Dietary recommendations    Goals      DIET - REDUCE CALORIE INTAKE     Recommend to continue current diet plan of not eating more than 1200 calories a day and watching carbohydrate intake.      Weight (lb) < 130 lb (59 kg)     Recommend cutting down carbohydrates by 45-50 grams a day to help aid in weight loss of 30 lbs.         Fall Risk: Fall Risk  10/23/2017 10/02/2017 09/22/2016 08/03/2016 07/27/2015  Falls in the past year? No No No No No    FALL RISK PREVENTION PERTAINING TO THE HOME:  Any stairs in or around the home? No  If so, are there any without handrails?  N/A  Home free of loose throw rugs in walkways, pet beds, electrical cords, etc? Yes  Adequate lighting in your home to reduce risk of falls? Yes   ASSISTIVE DEVICES UTILIZED TO PREVENT FALLS:  Life  alert? No  Use of a cane, walker or w/c? No  Grab bars in the bathroom? No  Shower chair or bench in shower? No  Elevated toilet seat or a handicapped toilet? No    TIMED UP AND GO:  Was the test performed? No .    Depression Screen PHQ 2/9 Scores 10/23/2017 10/02/2017 10/02/2017 09/22/2016  PHQ - 2 Score 0 0 0 0  PHQ- 9 Score - 3 - 2  Exception Documentation - - - -  Not completed - - - -     Cognitive Function: Declined today.     6CIT Screen 09/22/2016  What Year? 0 points  What month? 0 points  What time? 0 points  Count back from 20 0 points  Months in reverse 0 points  Repeat phrase 0 points  Total Score 0    Immunization History  Administered Date(s) Administered   Influenza Split 11/23/2011   Influenza-Unspecified 11/23/2011, 10/16/2012    Qualifies for Shingles Vaccine? Yes . Due for Shingrix. Pt has been advised to call insurance company to determine out of pocket expense. Advised may also receive vaccine at local pharmacy or Health Dept. Verbalized acceptance and understanding.  Tdap: Although this vaccine is not a covered service during a Wellness Exam, does the patient still wish to receive this vaccine today?  No . Advised may receive this vaccine at local pharmacy or Health Dept. Aware to provide a copy of the vaccination record if obtained from local pharmacy or Health Dept. Verbalized acceptance and understanding.  Flu Vaccine: Due for Flu vaccine. Does the patient want to receive this vaccine today?  No . Advised may receive this vaccine at local pharmacy or Health Dept. Aware to provide a copy of the vaccination record if obtained from local pharmacy or Health Dept. Verbalized acceptance and understanding.  Pneumococcal Vaccine: Due for Pneumococcal vaccine. Does the patient want to receive this vaccine today?  No . Advised may receive this vaccine  at local pharmacy or Health Dept. Aware to provide a copy of the vaccination record if obtained from local  pharmacy or Health Dept. Verbalized acceptance and understanding.   Screening Tests Health Maintenance  Topic Date Due   PNA vac Low Risk Adult (1 of 2 - PCV13) 02/24/2011   INFLUENZA VACCINE  08/17/2018   Fecal DNA (Cologuard)  08/17/2018   MAMMOGRAM  11/09/2019   DEXA SCAN  02/21/2022   Hepatitis C Screening  Completed   TETANUS/TDAP  Discontinued    Cancer Screenings:  Colorectal Screening: Cologuard completed 08/17/15. Repeat every 3 years. Order placed today.  Mammogram: Completed 11/08/17. Repeat every 1-2 years as advised.   Bone Density: Completed 02/21/17. Results reflect OSTEOPENIA. Repeat every 5 years.   Lung Cancer Screening: (Low Dose CT Chest recommended if Age 55-80 years, 30 pack-year currently smoking OR have quit w/in 15years.) does not qualify.   Additional Screening:  Hepatitis C Screening: Up to date  Vision Screening: Recommended annual ophthalmology exams for early detection of glaucoma and other disorders of the eye.  Dental Screening: Recommended annual dental exams for proper oral hygiene  Community Resource Referral:  CRR required this visit?  No       Plan:  I have personally reviewed and addressed the Medicare Annual Wellness questionnaire and have noted the following in the patient's chart:  A. Medical and social history B. Use of alcohol, tobacco or illicit drugs  C. Current medications and supplements D. Functional ability and status E.  Nutritional status F.  Physical activity G. Advance directives H. List of other physicians I.  Hospitalizations, surgeries, and ER visits in previous 12 months J.  [place] such as hearing and vision if needed, cognitive and depression L. Referrals and appointments   In addition, I have reviewed and discussed with patient certain preventive protocols, quality metrics, and best practice recommendations. A written personalized care plan for preventive services as well as general preventive  health recommendations were provided to patient. Nurse Health Advisor  Signed,    Demaree Liberto [place], [place]  [phone] Nurse Health Advisor   Nurse Notes: Pt declined receiving a future flu or pneumonia vaccine.   Reviewed note and plan of Nurse Health Advisor. Was available for consultation during screening. Agree with documentation and recommendations.

## 2019-03-11 ENCOUNTER — Ambulatory Visit (INDEPENDENT_AMBULATORY_CARE_PROVIDER_SITE_OTHER): Payer: Medicare HMO

## 2019-03-11 ENCOUNTER — Other Ambulatory Visit: Payer: Self-pay

## 2019-03-11 DIAGNOSIS — Z0000 Encounter for general adult medical examination without abnormal findings: Secondary | ICD-10-CM | POA: Diagnosis not present

## 2019-03-11 DIAGNOSIS — Z1211 Encounter for screening for malignant neoplasm of colon: Secondary | ICD-10-CM

## 2019-03-11 DIAGNOSIS — M2042 Other hammer toe(s) (acquired), left foot: Secondary | ICD-10-CM | POA: Diagnosis not present

## 2019-03-11 NOTE — Patient Instructions (Addendum)
Ms. Molly Mitchell , Thank you for taking time to come for your Medicare Wellness Visit. I appreciate your ongoing commitment to your health goals. Please review the following plan we discussed and let me know if I can assist you in the future.   Screening recommendations/referrals: Colonoscopy: Cologuard due. Order placed today. Mammogram: Up to date, due 10/2019 Bone Density: Up to date, due 02/2022 Recommended yearly ophthalmology/optometry visit for glaucoma screening and checkup Recommended yearly dental visit for hygiene and checkup  Vaccinations: Influenza vaccine: Pt declines today.  Pneumococcal vaccine: Pt declines receiving this in the future. Tdap vaccine: Pt declines today.  Shingles vaccine: Pt declines today.     Advanced directives: Currently on file  Conditions/risks identified: Recommend to work towards cutting back on carbohydrate and calorie intake to help aid with weight loss.   Next appointment: 03/23/20 @ 2:40 PM for an AWV. Pt declines scheduling a follow up apt with PCP at this time.   Preventive Care 72 Years and Older, Female Preventive care refers to lifestyle choices and visits with your health care provider that can promote health and wellness. What does preventive care include?  A yearly physical exam. This is also called an annual well check.  Dental exams once or twice a year.  Routine eye exams. Ask your health care provider how often you should have your eyes checked.  Personal lifestyle choices, including:  Daily care of your teeth and gums.  Regular physical activity.  Eating a healthy diet.  Avoiding tobacco and drug use.  Limiting alcohol use.  Practicing safe sex.  Taking low-dose aspirin every day.  Taking vitamin and mineral supplements as recommended by your health care provider. What happens during an annual well check? The services and screenings done by your health care provider during your annual well check will depend on your  age, overall health, lifestyle risk factors, and family history of disease. Counseling  Your health care provider may ask you questions about your:  Alcohol use.  Tobacco use.  Drug use.  Emotional well-being.  Home and relationship well-being.  Sexual activity.  Eating habits.  History of falls.  Memory and ability to understand (cognition).  Work and work Statistician.  Reproductive health. Screening  You may have the following tests or measurements:  Height, weight, and BMI.  Blood pressure.  Lipid and cholesterol levels. These may be checked every 5 years, or more frequently if you are over 23 years old.  Skin check.  Lung cancer screening. You may have this screening every year starting at age 74 if you have a 30-pack-year history of smoking and currently smoke or have quit within the past 15 years.  Fecal occult blood test (FOBT) of the stool. You may have this test every year starting at age 54.  Flexible sigmoidoscopy or colonoscopy. You may have a sigmoidoscopy every 5 years or a colonoscopy every 10 years starting at age 44.  Hepatitis C blood test.  Hepatitis B blood test.  Sexually transmitted disease (STD) testing.  Diabetes screening. This is done by checking your blood sugar (glucose) after you have not eaten for a while (fasting). You may have this done every 1-3 years.  Bone density scan. This is done to screen for osteoporosis. You may have this done starting at age 47.  Mammogram. This may be done every 1-2 years. Talk to your health care provider about how often you should have regular mammograms. Talk with your health care provider about your test results, treatment  options, and if necessary, the need for more tests. Vaccines  Your health care provider may recommend certain vaccines, such as:  Influenza vaccine. This is recommended every year.  Tetanus, diphtheria, and acellular pertussis (Tdap, Td) vaccine. You may need a Td booster  every 10 years.  Zoster vaccine. You may need this after age 90.  Pneumococcal 13-valent conjugate (PCV13) vaccine. One dose is recommended after age 12.  Pneumococcal polysaccharide (PPSV23) vaccine. One dose is recommended after age 50. Talk to your health care provider about which screenings and vaccines you need and how often you need them. This information is not intended to replace advice given to you by your health care provider. Make sure you discuss any questions you have with your health care provider. Document Released: 01/29/2015 Document Revised: 09/22/2015 Document Reviewed: 11/03/2014 Elsevier Interactive Patient Education  2017 [place] Prevention in the Home Falls can cause injuries. They can happen to people of all ages. There are many things you can do to make your home safe and to help prevent falls. What can I do on the outside of my home?  Regularly fix the edges of walkways and driveways and fix any cracks.  Remove anything that might make you trip as you walk through a door, such as a raised step or threshold.  Trim any bushes or trees on the path to your home.  Use bright outdoor lighting.  Clear any walking paths of anything that might make someone trip, such as rocks or tools.  Regularly check to see if handrails are loose or broken. Make sure that both sides of any steps have handrails.  Any raised decks and porches should have guardrails on the edges.  Have any leaves, snow, or ice cleared regularly.  Use sand or salt on walking paths during winter.  Clean up any spills in your garage right away. This includes oil or grease spills. What can I do in the bathroom?  Use night lights.  Install grab bars by the toilet and in the tub and shower. Do not use towel bars as grab bars.  Use non-skid mats or decals in the tub or shower.  If you need to sit down in the shower, use a plastic, non-slip stool.  Keep the floor dry. Clean up any  water that spills on the floor as soon as it happens.  Remove soap buildup in the tub or shower regularly.  Attach bath mats securely with double-sided non-slip rug tape.  Do not have throw rugs and other things on the floor that can make you trip. What can I do in the bedroom?  Use night lights.  Make sure that you have a light by your bed that is easy to reach.  Do not use any sheets or blankets that are too big for your bed. They should not hang down onto the floor.  Have a firm chair that has side arms. You can use this for support while you get dressed.  Do not have throw rugs and other things on the floor that can make you trip. What can I do in the kitchen?  Clean up any spills right away.  Avoid walking on wet floors.  Keep items that you use a lot in easy-to-reach places.  If you need to reach something above you, use a strong step stool that has a grab bar.  Keep electrical cords out of the way.  Do not use floor polish or wax that makes floors slippery.  If you must use wax, use non-skid floor wax.  Do not have throw rugs and other things on the floor that can make you trip. What can I do with my stairs?  Do not leave any items on the stairs.  Make sure that there are handrails on both sides of the stairs and use them. Fix handrails that are broken or loose. Make sure that handrails are as long as the stairways.  Check any carpeting to make sure that it is firmly attached to the stairs. Fix any carpet that is loose or worn.  Avoid having throw rugs at the top or bottom of the stairs. If you do have throw rugs, attach them to the floor with carpet tape.  Make sure that you have a light switch at the top of the stairs and the bottom of the stairs. If you do not have them, ask someone to add them for you. What else can I do to help prevent falls?  Wear shoes that:  Do not have high heels.  Have rubber bottoms.  Are comfortable and fit you well.  Are closed  at the toe. Do not wear sandals.  If you use a stepladder:  Make sure that it is fully opened. Do not climb a closed stepladder.  Make sure that both sides of the stepladder are locked into place.  Ask someone to hold it for you, if possible.  Clearly mark and make sure that you can see:  Any grab bars or handrails.  First and last steps.  Where the edge of each step is.  Use tools that help you move around (mobility aids) if they are needed. These include:  Canes.  Walkers.  Scooters.  Crutches.  Turn on the lights when you go into a dark area. Replace any light bulbs as soon as they burn out.  Set up your furniture so you have a clear path. Avoid moving your furniture around.  If any of your floors are uneven, fix them.  If there are any pets around you, be aware of where they are.  Review your medicines with your doctor. Some medicines can make you feel dizzy. This can increase your chance of falling. Ask your doctor what other things that you can do to help prevent falls. This information is not intended to replace advice given to you by your health care provider. Make sure you discuss any questions you have with your health care provider. Document Released: 10/29/2008 Document Revised: 06/10/2015 Document Reviewed: 02/06/2014 Elsevier Interactive Patient Education  2017 [employer].

## 2019-03-18 DIAGNOSIS — Z6830 Body mass index (BMI) 30.0-30.9, adult: Secondary | ICD-10-CM | POA: Diagnosis not present

## 2019-03-18 DIAGNOSIS — I10 Essential (primary) hypertension: Secondary | ICD-10-CM | POA: Diagnosis not present

## 2019-03-18 DIAGNOSIS — M4712 Other spondylosis with myelopathy, cervical region: Secondary | ICD-10-CM | POA: Diagnosis not present

## 2019-04-15 DIAGNOSIS — M2042 Other hammer toe(s) (acquired), left foot: Secondary | ICD-10-CM | POA: Diagnosis not present

## 2019-04-16 ENCOUNTER — Other Ambulatory Visit: Payer: Self-pay | Admitting: Family Medicine

## 2019-04-22 DIAGNOSIS — Z1211 Encounter for screening for malignant neoplasm of colon: Secondary | ICD-10-CM | POA: Diagnosis not present

## 2019-04-22 LAB — COLOGUARD: Cologuard: NEGATIVE

## 2019-05-20 DIAGNOSIS — M21621 Bunionette of right foot: Secondary | ICD-10-CM | POA: Diagnosis not present

## 2019-06-11 DIAGNOSIS — M5441 Lumbago with sciatica, right side: Secondary | ICD-10-CM | POA: Insufficient documentation

## 2019-06-17 DIAGNOSIS — M722 Plantar fascial fibromatosis: Secondary | ICD-10-CM | POA: Diagnosis not present

## 2019-06-17 DIAGNOSIS — M2042 Other hammer toe(s) (acquired), left foot: Secondary | ICD-10-CM | POA: Diagnosis not present

## 2019-06-20 ENCOUNTER — Other Ambulatory Visit: Payer: Self-pay | Admitting: Neurosurgery

## 2019-06-20 DIAGNOSIS — M5441 Lumbago with sciatica, right side: Secondary | ICD-10-CM

## 2019-06-30 DIAGNOSIS — E782 Mixed hyperlipidemia: Secondary | ICD-10-CM | POA: Diagnosis not present

## 2019-06-30 DIAGNOSIS — I10 Essential (primary) hypertension: Secondary | ICD-10-CM | POA: Diagnosis not present

## 2019-07-09 DIAGNOSIS — D235 Other benign neoplasm of skin of trunk: Secondary | ICD-10-CM | POA: Diagnosis not present

## 2019-07-09 DIAGNOSIS — D485 Neoplasm of uncertain behavior of skin: Secondary | ICD-10-CM | POA: Diagnosis not present

## 2019-07-09 DIAGNOSIS — L570 Actinic keratosis: Secondary | ICD-10-CM | POA: Diagnosis not present

## 2019-07-09 DIAGNOSIS — Z85828 Personal history of other malignant neoplasm of skin: Secondary | ICD-10-CM | POA: Diagnosis not present

## 2019-07-09 DIAGNOSIS — X32XXXA Exposure to sunlight, initial encounter: Secondary | ICD-10-CM | POA: Diagnosis not present

## 2019-07-09 DIAGNOSIS — Z08 Encounter for follow-up examination after completed treatment for malignant neoplasm: Secondary | ICD-10-CM | POA: Diagnosis not present

## 2019-07-26 ENCOUNTER — Ambulatory Visit
Admission: RE | Admit: 2019-07-26 | Discharge: 2019-07-26 | Disposition: A | Discharge status: Home / Self Care | Payer: Medicare HMO | Source: Ambulatory Visit | Attending: Neurosurgery | Admitting: Neurosurgery

## 2019-07-26 ENCOUNTER — Other Ambulatory Visit: Payer: Self-pay

## 2019-07-26 DIAGNOSIS — M48061 Spinal stenosis, lumbar region without neurogenic claudication: Secondary | ICD-10-CM | POA: Diagnosis not present

## 2019-07-26 DIAGNOSIS — M4316 Spondylolisthesis, lumbar region: Secondary | ICD-10-CM | POA: Diagnosis not present

## 2019-07-26 DIAGNOSIS — M5126 Other intervertebral disc displacement, lumbar region: Secondary | ICD-10-CM | POA: Diagnosis not present

## 2019-07-26 DIAGNOSIS — M5441 Lumbago with sciatica, right side: Secondary | ICD-10-CM

## 2019-07-26 IMAGING — MR MR LUMBAR SPINE WO/W CM
4 of 7 series · 20 of 48 positions shown · IV contrast (multihance)
Comparison: Radiography [DATE].  MRI [DATE].

CLINICAL DATA: Left leg pain extending from the hip to the ankle.

EXAM:
MRI LUMBAR SPINE WITHOUT AND WITH CONTRAST
TECHNIQUE: Multiplanar and multiecho pulse sequences of the lumbar spine were
obtained without and with intravenous contrast.
CONTRAST:  15mL MULTIHANCE GADOBENATE DIMEGLUMINE 529 MG/ML IV SOLN

[Series 6: T1 · sagittal · 4.0mm · 0.73mm/px · 5 of 15 slices shown (1 of 2)]
[im 1/15]
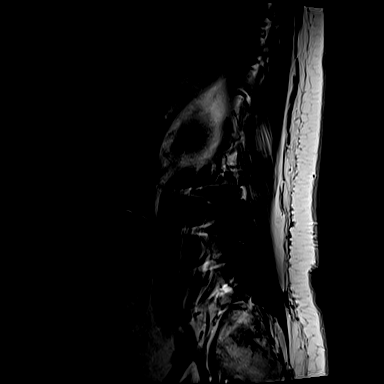
[im 4/15]
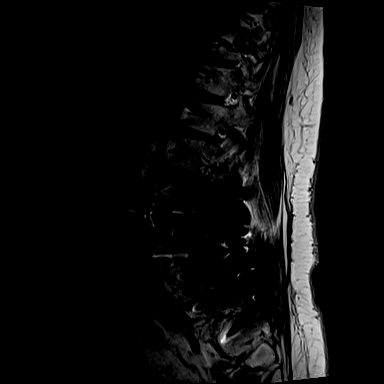
[im 8/15]
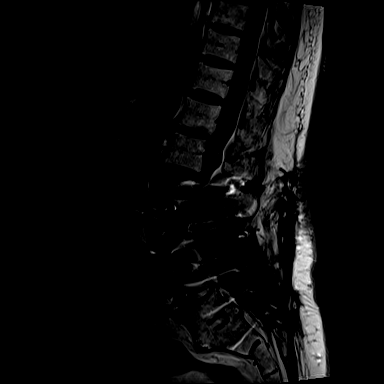
[im 11/15]
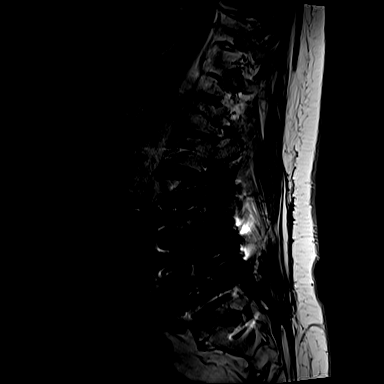
[im 15/15]
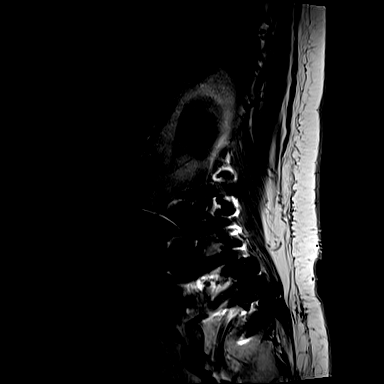

[Series 10: T1 · axial · 4.0mm · 0.28mm/px · z∈[-51,+103]mm · 3 of 34 slices shown (2 of 2)]
[im 4/34]
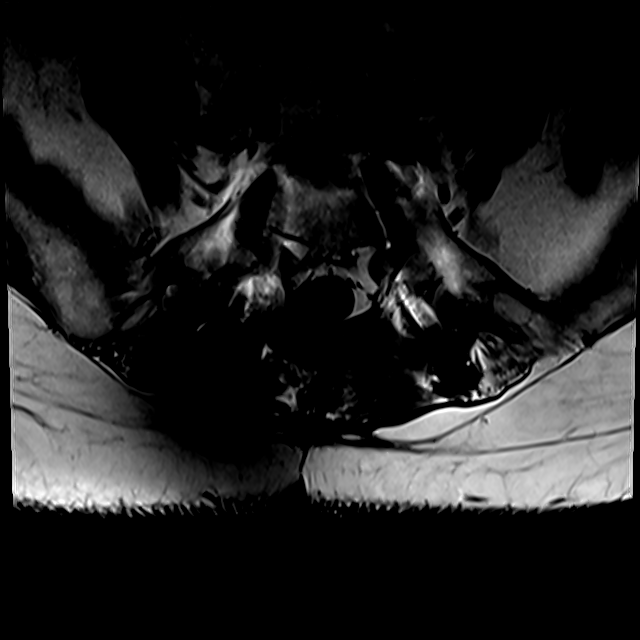
[im 19/34]
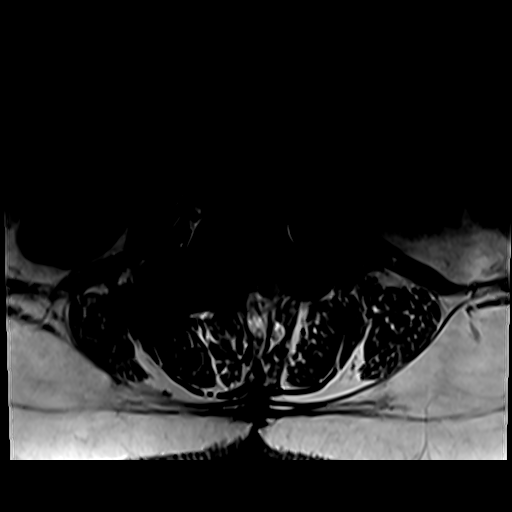
[im 30/34]
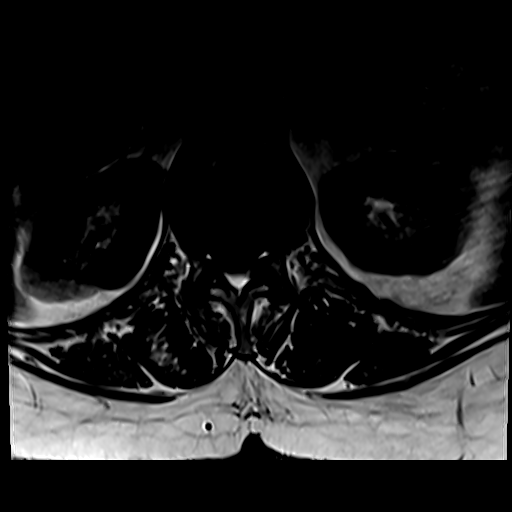

[Series 13: T2 · axial · 4.0mm · 0.28mm/px · z∈[-64,+123]mm · 8 of 34 slices shown (1 of 2)]
[im 1/34]
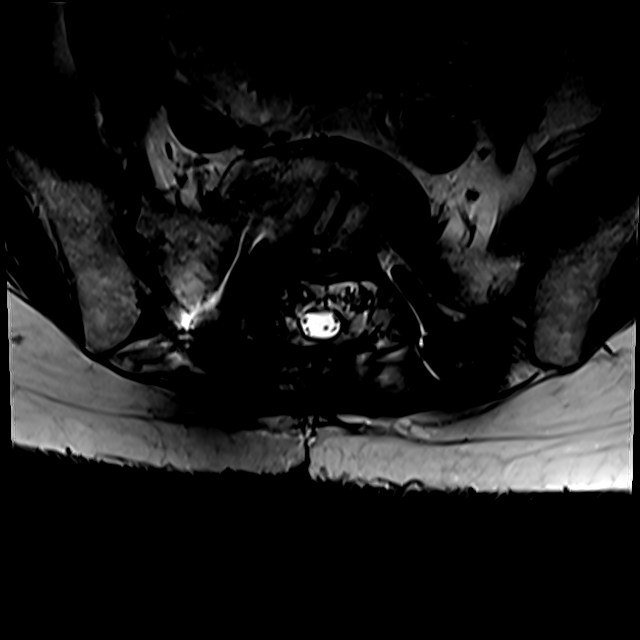
[im 4/34]
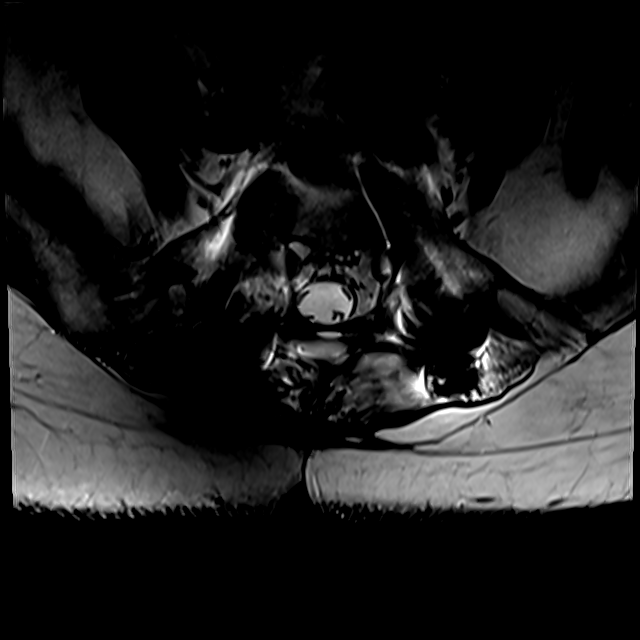
[im 12/34]
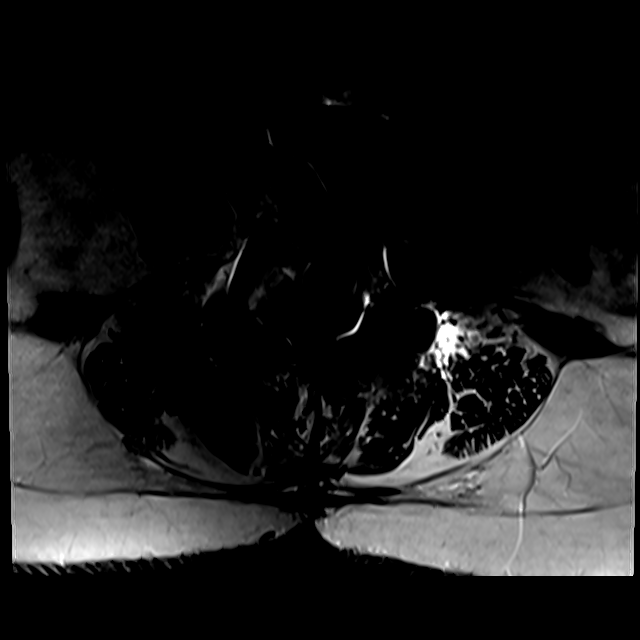
[im 15/34]
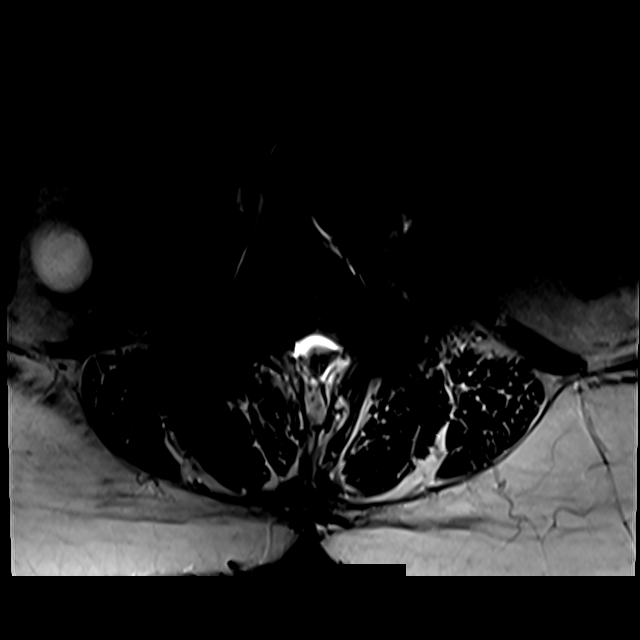
[im 19/34]
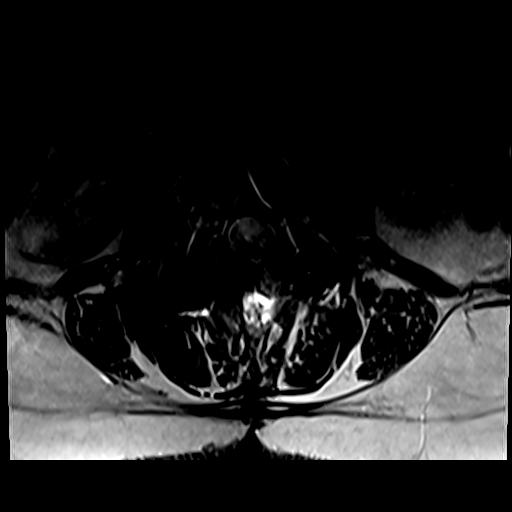
[im 23/34]
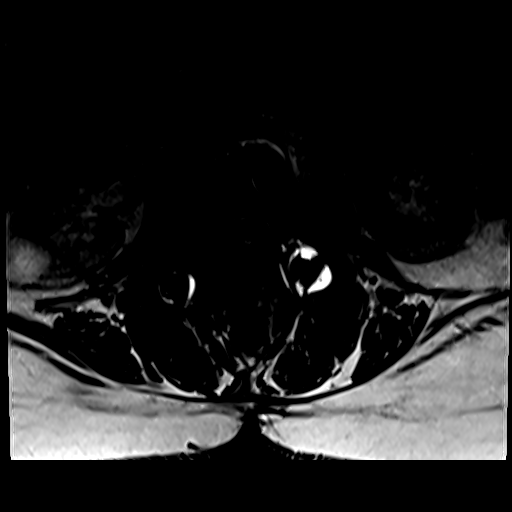
[im 30/34]
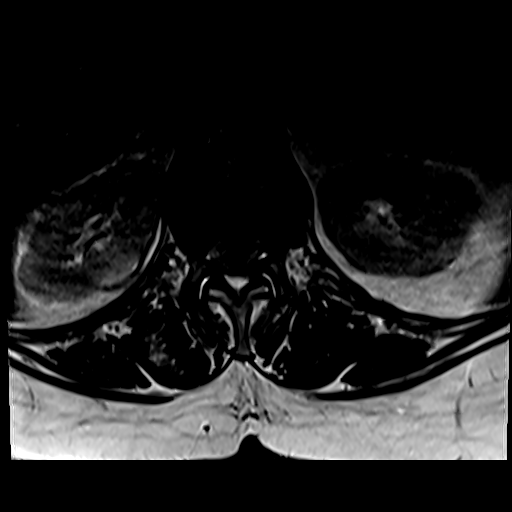
[im 34/34]
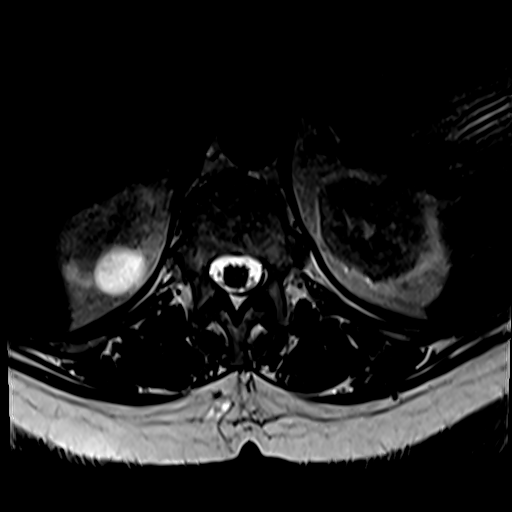

[Series 14: T2 · sagittal · 4.0mm · 0.73mm/px · 4 of 15 slices shown (2 of 2)]
[im 1/15]
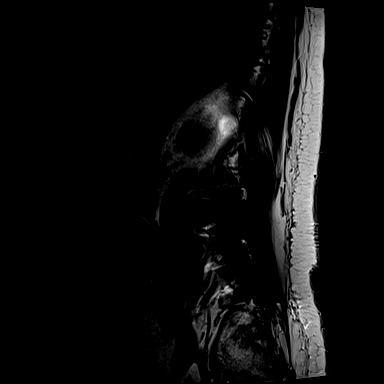
[im 5/15]
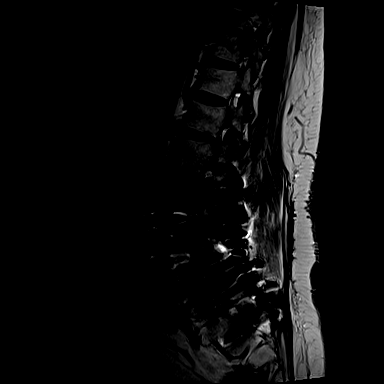
[im 10/15]
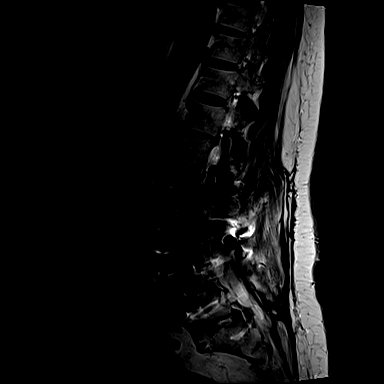
[im 15/15]
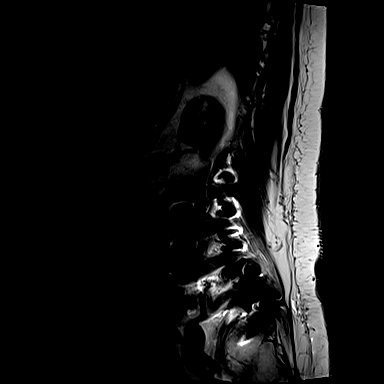

[20 of 48 positions shown; findings below may reference images not displayed]

FINDINGS: Segmentation: 5 lumbar type vertebral bodies as numbered previously.

Alignment:  2 mm retrolisthesis T12-L1 and L1-2.

Vertebrae:  Distant fusion procedure from L2 to the sacrum.

Conus medullaris and cauda equina: Conus extends to the L1 level.
Conus and cauda equina appear normal.

Paraspinal and other soft tissues: Right renal cysts.

Disc levels:

T9-10: Central disc herniation indenting the ventral thecal sac. No
visible cord compression. Not studied in the axial plane.

T10-11: Right facet hypertrophy. No compressive stenosis. Not
studied in the axial plane.

T11-12: Normal interspace.

T12-L1: 2 mm retrolisthesis. Shallow protrusion of the disc.
Effacement of the subarachnoid space but no visible compression of
the actual distal cord/conus. AP diameter of the canal 9.3 mm. Mild
facet and ligamentous hypertrophy. Mild bilateral foraminal
narrowing. Findings worsened since [SL].

L1-2: 2 mm retrolisthesis. Bulging of the disc. Pronounced facet and
ligamentous hypertrophy. Limited detail because of artifact from
fusion hardware. Stenosis at this level could possibly cause neural
compression. Findings worsened since [SL].

L2 to sacrum: Previous posterior decompression, diskectomy and
fusion. No apparent change. Apparent sufficient patency of the canal
and foramina.
IMPRESSION: No visible change in the decompression and fusion region from L2 to
the sacrum.

Worsened adjacent segment degenerative disease at L1-2.
Retrolisthesis of 2 mm. Bulging of the disc. Facet hypertrophy.
Spinal stenosis at this level that could be significant. Detail is
somewhat limited by artifact from fusion hardware.

Worsened degenerative disease at T12-L1. Retrolisthesis of 2 mm.
Shallow protrusion of the disc. Facet and ligamentous hypertrophy.
Canal stenosis but without distinct neural compression this moment.

Central disc herniation at T9-10 incidentally noted but not studied
completely. No definite neural compression because of this.

## 2019-07-26 MED ORDER — GADOBENATE DIMEGLUMINE 529 MG/ML IV SOLN
15.0000 mL | Freq: Once | INTRAVENOUS | Status: AC | PRN
  Administered 2019-07-26: 15 mL via INTRAVENOUS

## 2019-07-28 DIAGNOSIS — I10 Essential (primary) hypertension: Secondary | ICD-10-CM | POA: Diagnosis not present

## 2019-07-29 DIAGNOSIS — L570 Actinic keratosis: Secondary | ICD-10-CM | POA: Diagnosis not present

## 2019-07-29 DIAGNOSIS — X32XXXA Exposure to sunlight, initial encounter: Secondary | ICD-10-CM | POA: Diagnosis not present

## 2019-08-27 DIAGNOSIS — E782 Mixed hyperlipidemia: Secondary | ICD-10-CM | POA: Diagnosis not present

## 2019-08-27 DIAGNOSIS — I10 Essential (primary) hypertension: Secondary | ICD-10-CM | POA: Diagnosis not present

## 2019-08-29 DIAGNOSIS — M48062 Spinal stenosis, lumbar region with neurogenic claudication: Secondary | ICD-10-CM | POA: Diagnosis not present

## 2019-08-29 DIAGNOSIS — M4712 Other spondylosis with myelopathy, cervical region: Secondary | ICD-10-CM | POA: Diagnosis not present

## 2019-09-08 DIAGNOSIS — H524 Presbyopia: Secondary | ICD-10-CM | POA: Diagnosis not present

## 2020-01-08 DIAGNOSIS — D225 Melanocytic nevi of trunk: Secondary | ICD-10-CM | POA: Diagnosis not present

## 2020-01-08 DIAGNOSIS — Z85828 Personal history of other malignant neoplasm of skin: Secondary | ICD-10-CM | POA: Diagnosis not present

## 2020-01-08 DIAGNOSIS — L821 Other seborrheic keratosis: Secondary | ICD-10-CM | POA: Diagnosis not present

## 2020-01-08 DIAGNOSIS — D2262 Melanocytic nevi of left upper limb, including shoulder: Secondary | ICD-10-CM | POA: Diagnosis not present

## 2020-01-08 DIAGNOSIS — D2272 Melanocytic nevi of left lower limb, including hip: Secondary | ICD-10-CM | POA: Diagnosis not present

## 2020-01-08 DIAGNOSIS — D2261 Melanocytic nevi of right upper limb, including shoulder: Secondary | ICD-10-CM | POA: Diagnosis not present

## 2020-01-26 ENCOUNTER — Encounter: Payer: Self-pay | Admitting: Family Medicine

## 2020-01-26 ENCOUNTER — Other Ambulatory Visit: Payer: Self-pay

## 2020-01-26 ENCOUNTER — Ambulatory Visit (INDEPENDENT_AMBULATORY_CARE_PROVIDER_SITE_OTHER): Payer: Medicare HMO | Admitting: Family Medicine

## 2020-01-26 VITALS — BP 166/75 | HR 82 | Temp 98.4°F | Wt 163.0 lb

## 2020-01-26 DIAGNOSIS — R011 Cardiac murmur, unspecified: Secondary | ICD-10-CM | POA: Diagnosis not present

## 2020-01-26 DIAGNOSIS — Z792 Long term (current) use of antibiotics: Secondary | ICD-10-CM

## 2020-01-26 DIAGNOSIS — Z96652 Presence of left artificial knee joint: Secondary | ICD-10-CM | POA: Diagnosis not present

## 2020-01-26 MED ORDER — AMOXICILLIN 500 MG PO CAPS: ORAL_CAPSULE | ORAL | 3 refills | Status: DC

## 2020-01-26 NOTE — Progress Notes (Signed)
Established patient visit   Patient: Molly Mitchell   DOB: 08-29-1945   75 y.o. Female  MRN: [id number] Visit Date: 01/26/2020  Today's healthcare provider: [name], PA-C   No chief complaint on file.  Subjective    HPI  Patient in for prescription of antibiotic for dental procedure.  Reports no complains and feeling.  Past Medical History:  Diagnosis Date  . Arthritis   . Heart murmur    as a child some dr say they hear it some say they don't  . HLD (hyperlipidemia)   . Hypertension   . Neuromuscular disorder (HCC)    weakness in legs when getting up  . 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 ABDOMINAL HYSTERECTOMY  1981  . Barbie Banner OSTEOTOMY Right 01/30/2018   Procedure: WEIL RIGHT 2ND AND 3RD;  Surgeon: [name], DPM;  Location: [place];  Service: Podiatry;  Laterality: Right;  . CORRECTION HAMMER TOE    . HAMMER TOE SURGERY Right 01/30/2018   Procedure: HAMMER TOE CORRECTION SECOND AND THIRD;  Surgeon: [name], DPM;  Location: [place];  Service: Podiatry;  Laterality: Right;  GENERAL WITH LOCAL  . HAMMER TOE SURGERY Left 03/05/2019   Procedure: HAMMER TOE CORRECTION T1 AND T2;  Surgeon: [name], DPM;  Location: [place];  Service: Podiatry;  Laterality: Left;  . JOINT REPLACEMENT Left 03/03/2013  . MRI    . ROTATOR CUFF REPAIR Right 1981  . SPINAL FUSION  2001   lower back   . SPINAL FUSION  2003   neck  . TOTAL KNEE REVISION Left 07/31/2016   Procedure: TOTAL KNEE REVISION;  Surgeon: Dereck Leep, MD;  Location: [facility];  Service: Orthopedics;  Laterality: Left;  . WEIL OSTEOTOMY Left 03/05/2019   Procedure: WEIL OSTEOTOMY X 2 LEFT;  Surgeon: [name], DPM;  Location: [place];  Service: Podiatry;  Laterality: Left;   Social History   Tobacco Use  . Smoking status: Former Smoker    Quit date: 04/17/1979    Years since  quitting: 40.8  . Smokeless tobacco: Never Used  Vaping Use  . Vaping Use: Never used  Substance Use Topics  . Alcohol use: No    Alcohol/week: 0.0 standard drinks  . Drug use: No   Family History  Problem Relation Age of Onset  . Hypertension Brother   . Hypertension Mother   . Hypertension Sister   . Cancer Sister   . Diabetes Paternal Grandmother   . Heart failure Maternal Grandmother    Allergies  Allergen Reactions  . Amitriptyline Hcl Swelling and Other (See Comments)    Facial swelling  . Duricef [Cefadroxil] Swelling and Other (See Comments)    Facial swelling  . Levofloxacin Other (See Comments)    Severe Shoulder Pain  . Tetanus Toxoid Swelling and Other (See Comments)    Arm swelling  . Tramadol Itching  . Erythromycin Rash and Other (See Comments)    Rash on bottom   Medications: Outpatient Medications Prior to Visit  Medication Sig  . amoxicillin (AMOXIL) 500 MG capsule Take 4 tablets by mouth 1 hour prior to dental procedures.  . Ascorbic Acid (VITAMIN C) 1000 MG tablet Take 1,000 mg by mouth daily.   . Calcium-Magnesium 500-250 MG TABS Take 2 tablets by mouth daily.  . Cholecalciferol (VITAMIN D3) 1000 UNITS CAPS Take 1,000 Units by mouth daily.   Marland Kitchen  meloxicam (MOBIC) 15 MG tablet TAKE 1 TABLET EVERY DAY  . Multiple Vitamins-Minerals (MULTIVITAMIN WITH MINERALS) tablet Take 3 tablets by mouth daily.  . naproxen sodium (ALEVE) 220 MG tablet Take 220 mg by mouth daily as needed.   . nystatin-triamcinolone ointment (MYCOLOG) USE 1 TOPICAL APPLICATION TWICE DAILY (Patient taking differently: as needed.)  . pravastatin (PRAVACHOL) 40 MG tablet Take 40 mg by mouth at bedtime.  . vitamin E 400 UNIT capsule Take 400 Units by mouth daily.   Marland Kitchen atenolol (TENORMIN) 50 MG tablet Take 50 mg by mouth daily.   . B Complex Vitamins (VITAMIN B COMPLEX PO) Take by mouth.  . Milk Thistle 150 MG CAPS Take 150 mg by mouth.  . oxyCODONE-acetaminophen (PERCOCET) 5-325 MG tablet  Take 1 tablet by mouth every 4 (four) hours as needed for severe pain.   No facility-administered medications prior to visit.    Review of Systems  Respiratory: Negative for cough and shortness of breath.   Cardiovascular: Negative for chest pain and leg swelling.  Neurological: Negative for headaches.      Objective    BP (!) 166/75 (BP Location: Right Arm, Patient Position: Sitting, Cuff Size: Normal)   Pulse 82   Temp 98.4 F (36.9 C) (Oral)   Wt 163 lb (73.9 kg)   SpO2 100%   BMI 28.87 kg/m    Physical Exam Constitutional:      General: She is not in acute distress.    Appearance: She is well-developed and well-nourished.  HENT:     Head: Normocephalic and atraumatic.     Right Ear: Hearing normal.     Left Ear: Hearing normal.     Nose: Nose normal.  Eyes:     General: Lids are normal. No scleral icterus.       Right eye: No discharge.        Left eye: No discharge.     Conjunctiva/sclera: Conjunctivae normal.  Cardiovascular:     Rate and Rhythm: Normal rate and regular rhythm.     Heart sounds: Murmur heard.    Pulmonary:     Effort: Pulmonary effort is normal. No respiratory distress.     Breath sounds: Normal breath sounds.  Musculoskeletal:        General: Normal range of motion.     Cervical back: Neck supple.  Skin:    General: Skin is intact.     Findings: No lesion or rash.  Neurological:     Mental Status: She is alert and oriented to person, place, and time.  Psychiatric:        Mood and Affect: Mood and affect normal.        Speech: Speech normal.        Behavior: Behavior normal.        Thought Content: Thought content normal.       No results found for any visits on 01/26/20.  Assessment & Plan     1. Heart murmur Mild and unchanged systolic murmur. Hypertension and hyperlipidemia followed by cardiologist (Dr. Nehemiah Massed).  2. Status post total left knee replacement Surgery in 2018 with full recovery. Good ROM and no pain.  3.  Prophylactic antibiotic Orthopedic surgeon recommended antibiotics before dental procedures to prevent infection. Refilled prescription. - amoxicillin (AMOXIL) 500 MG capsule; Take 4 tablets by mouth 1 hour prior to dental procedures.  Dispense: 12 capsule; Refill: 3   No follow-ups on file.      I, Yandriel Boening, PA-C, have  reviewed all documentation for this visit. The documentation on 01/26/20 for the exam, diagnosis, procedures, and orders are all accurate and complete.    [name], PA-C  [employer] [phone] (phone) [phone] (fax)  [place]

## 2020-02-09 DIAGNOSIS — R0600 Dyspnea, unspecified: Secondary | ICD-10-CM | POA: Diagnosis not present

## 2020-02-09 DIAGNOSIS — I10 Essential (primary) hypertension: Secondary | ICD-10-CM | POA: Diagnosis not present

## 2020-02-09 DIAGNOSIS — I340 Nonrheumatic mitral (valve) insufficiency: Secondary | ICD-10-CM | POA: Diagnosis not present

## 2020-02-09 DIAGNOSIS — E782 Mixed hyperlipidemia: Secondary | ICD-10-CM | POA: Diagnosis not present

## 2020-02-09 DIAGNOSIS — I341 Nonrheumatic mitral (valve) prolapse: Secondary | ICD-10-CM | POA: Diagnosis not present

## 2020-02-10 ENCOUNTER — Telehealth: Payer: Self-pay

## 2020-02-10 NOTE — Telephone Encounter (Signed)
Should follow up with cardiologist since I will be out of commission for a while.

## 2020-02-10 NOTE — Telephone Encounter (Signed)
Copied from [place] [id number]. Topic: Quick Communication - See Telephone Encounter >> Feb 10, 2020  3:05 PM L[place] wrote: CRM for notification. See Telephone encounter for: 02/10/20. [facility] [employer]/ Kellie Simmering - FU at [phone]  RE pt pt Blood Pressure/ came in on 13th  for their services, BP elevated,  dismissed appt... pt saw cardiologist, changed meds, resched, came back again and  BP still elevated had to dismiss again. Pt was going to call, PCP to discuss, [employer] FU of procedures, may message back in epic , willing to answer any questions Dr Lu Duffel have.

## 2020-02-12 ENCOUNTER — Other Ambulatory Visit: Payer: Self-pay

## 2020-02-12 ENCOUNTER — Ambulatory Visit (INDEPENDENT_AMBULATORY_CARE_PROVIDER_SITE_OTHER): Payer: Medicare HMO | Admitting: Family Medicine

## 2020-02-12 VITALS — BP 159/73 | HR 66 | Temp 98.1°F | Wt 161.0 lb

## 2020-02-12 DIAGNOSIS — I10 Essential (primary) hypertension: Secondary | ICD-10-CM | POA: Diagnosis not present

## 2020-02-12 DIAGNOSIS — E876 Hypokalemia: Secondary | ICD-10-CM

## 2020-02-12 DIAGNOSIS — F411 Generalized anxiety disorder: Secondary | ICD-10-CM

## 2020-02-12 DIAGNOSIS — R69 Illness, unspecified: Secondary | ICD-10-CM | POA: Diagnosis not present

## 2020-02-12 DIAGNOSIS — I340 Nonrheumatic mitral (valve) insufficiency: Secondary | ICD-10-CM

## 2020-02-12 DIAGNOSIS — E7800 Pure hypercholesterolemia, unspecified: Secondary | ICD-10-CM

## 2020-02-12 MED ORDER — LORAZEPAM 0.5 MG PO TABS: ORAL_TABLET | ORAL | 0 refills | Status: DC

## 2020-02-12 NOTE — Progress Notes (Signed)
Acute Office Visit  Subjective:    Patient ID: Molly Mitchell, female    DOB: 03-Aug-1945, 75 y.o.   MRN: [id number]  No chief complaint on file.   HPI Patient is in today for evaluation of abnormal blood pressure.  The patient states that her blood pressure is normal at home.  However, when she goes to the new dentist for a pending procedure they check her blood pressure and get very elevated readings.   Patient was seen by dentist on 01/29/20 with BP readings or 193/106 and 193/97. Dentist sent patient home and said they could not do the procedure with the blood pressure that high.  She rescheduled and was seen on 02/10/20 with reading of 197/94 and 200/98.  Patient was again sent home. She states that she checked her pressure at home on 02/10/20 prior to her visits and got 138/65. Patient was seen by cardiology shortly after the first visit.  Her blood pressure during that visit was 148/90.  She was started on Carvedilol 3.125 BID in addition to her Valsartan.  He was going to also put her on Metoprolol and even sent it to the pharmacy.  However, per the patient she was instructed not to take it.  Patient states she has been on the Valsartan for about 8 months.  This was not on our medication list but it was on the cardiologists list.   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 ([place]) 09/07/2017    Past Surgical History:  Procedure Laterality Date   ABDOMINAL HYSTERECTOMY  1981   AIKEN OSTEOTOMY Right 01/30/2018   Procedure: WEIL RIGHT 2ND AND 3RD;  Surgeon: [name], DPM;  Location: [facility];  Service: Podiatry;  Laterality: Right;   CORRECTION HAMMER TOE     HAMMER TOE SURGERY Right 01/30/2018   Procedure: HAMMER TOE CORRECTION SECOND AND THIRD;   Surgeon: [name], DPM;  Location: [facility] [facility];  Service: Podiatry;  Laterality: Right;  GENERAL WITH LOCAL   HAMMER TOE SURGERY Left 03/05/2019   Procedure: HAMMER TOE CORRECTION T1 AND T2;  Surgeon: [name], DPM;  Location: [facility];  Service: Podiatry;  Laterality: Left;   JOINT REPLACEMENT Left 03/03/2013   MRI     ROTATOR CUFF REPAIR Right 1981   SPINAL FUSION  2001   lower back    SPINAL FUSION  2003   neck   TOTAL KNEE REVISION Left 07/31/2016   Procedure: TOTAL KNEE REVISION;  Surgeon: Dereck Leep, MD;  Location: [facility];  Service: Orthopedics;  Laterality: Left;   WEIL OSTEOTOMY Left 03/05/2019   Procedure: WEIL OSTEOTOMY X 2 LEFT;  Surgeon: [name], DPM;  Location: [place];  Service: Podiatry;  Laterality: Left;    Family History  Problem Relation Age of Onset   Hypertension Brother    Hypertension Mother    Hypertension Sister    Cancer Sister    Diabetes Paternal Grandmother    Heart failure Maternal Grandmother     Social History   Socioeconomic History   Marital status: Married    Spouse name: Not on file   Number of children: 2   Years of  education: Not on file   Highest education level: High school graduate  Occupational History   Occupation: retired  Tobacco Use   Smoking status: Former Smoker    Quit date: 04/17/1979    Years since quitting: 40.8   Smokeless tobacco: Never Used  Vaping Use   Vaping Use: Never used  Substance and Sexual Activity   Alcohol use: No    Alcohol/week: 0.0 standard drinks   Drug use: No   Sexual activity: Not on file  Other Topics Concern   Not on file  Social History Narrative   Not on file   Social Determinants of Health   Financial Resource Strain: Low Risk    Difficulty of Paying Living Expenses: Not hard at all  Food Insecurity: No Food Insecurity   Worried About Charity fundraiser in the Last Year: Never true   [place] in the Last Year: Never true   Transportation Needs: No Transportation Needs   Lack of Transportation (Medical): No   Lack of Transportation (Non-Medical): No  Physical Activity: Sufficiently Active   Days of Exercise per Week: 5 days   Minutes of Exercise per Session: 120 min  Stress: No Stress Concern Present   Feeling of Stress : Not at all  Social Connections: Moderately Integrated   Frequency of Communication with Friends and Family: More than three times a week   Frequency of Social Gatherings with Friends and Family: More than three times a week   Attends Religious Services: More than 4 times per year   Active Member of [employer] or Organizations: No   Attends Archivist Meetings: Never   Marital Status: Married  Human resources officer Violence: Not At Risk   Fear of Current or Ex-Partner: No   Emotionally Abused: No   Physically Abused: No   Sexually Abused: No    Outpatient Medications Prior to Visit  Medication Sig Dispense Refill   amoxicillin (AMOXIL) 500 MG capsule Take 4 tablets by mouth 1 hour prior to dental procedures. 12 capsule 3   Ascorbic Acid (VITAMIN C) 1000 MG tablet Take 1,000 mg by mouth daily.      Calcium-Magnesium 500-250 MG TABS Take 2 tablets by mouth daily.     carvedilol (COREG) 3.125 MG tablet Take 3.125 mg by mouth 2 (two) times daily with a meal.     Cholecalciferol (VITAMIN D3) 1000 UNITS CAPS Take 1,000 Units by mouth daily.      meloxicam (MOBIC) 15 MG tablet TAKE 1 TABLET EVERY DAY 90 tablet 3   Multiple Vitamins-Minerals (MULTIVITAMIN WITH MINERALS) tablet Take 3 tablets by mouth daily.     naproxen sodium (ALEVE) 220 MG tablet Take 220 mg by mouth daily as needed.      nystatin-triamcinolone ointment (MYCOLOG) USE 1 TOPICAL APPLICATION TWICE DAILY (Patient taking differently: as needed.) 60 g 0   pravastatin (PRAVACHOL) 40 MG tablet Take 40 mg by mouth at bedtime.     valsartan (DIOVAN) 320 MG tablet Take 320 mg by mouth daily.     vitamin E 400 UNIT capsule Take 400  Units by mouth daily.      No facility-administered medications prior to visit.    Allergies  Allergen Reactions   Amitriptyline Hcl Swelling and Other (See Comments)    Facial swelling   Duricef [Cefadroxil] Swelling and Other (See Comments)    Facial swelling   Levofloxacin Other (See Comments)    Severe Shoulder Pain   Tetanus Toxoid Swelling and  Other (See Comments)    Arm swelling   Tramadol Itching   Erythromycin Rash and Other (See Comments)    Rash on bottom    Review of Systems  Respiratory: Negative for shortness of breath.   Cardiovascular: Negative for chest pain, palpitations and leg swelling.  Neurological: Negative for dizziness and headaches.       Objective:    Physical Exam Constitutional:      General: She is not in acute distress.    Appearance: She is well-developed and well-nourished.  HENT:     Head: Normocephalic and atraumatic.     Right Ear: Hearing normal.     Left Ear: Hearing normal.     Nose: Nose normal.  Eyes:     General: Lids are normal. No scleral icterus.       Right eye: No discharge.        Left eye: No discharge.     Conjunctiva/sclera: Conjunctivae normal.  Cardiovascular:     Rate and Rhythm: Normal rate and regular rhythm.     Pulses: Normal pulses.     Heart sounds: Normal heart sounds.  Pulmonary:     Effort: Pulmonary effort is normal. No respiratory distress.     Breath sounds: Normal breath sounds.  Abdominal:     General: Bowel sounds are normal.     Palpations: Abdomen is soft.  Musculoskeletal:        General: Normal range of motion.     Cervical back: Neck supple.  Skin:    General: Skin is intact.     Findings: No lesion or rash.  Neurological:     Mental Status: She is alert and oriented to person, place, and time.  Psychiatric:        Mood and Affect: Mood and affect normal.        Speech: Speech normal.        Behavior: Behavior normal.        Thought Content: Thought content normal.     BP (!)  159/73 (BP Location: Right Arm, Patient Position: Sitting, Cuff Size: Normal)    Pulse 66    Temp 98.1 F (36.7 C) (Oral)    Wt 161 lb (73 kg)    SpO2 100%    BMI 28.52 kg/m   BP Readings from Last 3 Encounters:  02/12/20 (!) 159/73  01/26/20 (!) 166/75  03/05/19 135/61    Wt Readings from Last 3 Encounters:  02/12/20 161 lb (73 kg)  01/26/20 163 lb (73.9 kg)  03/05/19 169 lb (76.7 kg)    Health Maintenance Due  Topic Date Due   COVID-19 Vaccine (1) Never done   INFLUENZA VACCINE  08/17/2019   MAMMOGRAM  11/09/2019    There are no preventive care reminders to display for this patient.   Lab Results  Component Value Date   TSH 1.500 10/23/2017   Lab Results  Component Value Date   WBC 6.9 10/23/2017   HGB 14.9 10/23/2017   HCT 43.5 10/23/2017   MCV 93 10/23/2017   PLT 223 10/23/2017   Lab Results  Component Value Date   NA 138 10/23/2017   K 4.1 10/23/2017   CO2 22 10/23/2017   GLUCOSE 79 10/23/2017   BUN 38 (H) 10/23/2017   CREATININE 0.94 10/23/2017   BILITOT 0.5 10/23/2017   ALKPHOS 76 10/23/2017   AST 23 10/23/2017   ALT 21 10/23/2017   PROT 6.8 10/23/2017   ALBUMIN 4.4 10/23/2017   CALCIUM  9.6 10/23/2017   ANIONGAP 9 12/19/2016   Lab Results  Component Value Date   CHOL 222 (H) 10/23/2017   Lab Results  Component Value Date   HDL 81 10/23/2017   Lab Results  Component Value Date   LDLCALC 124 (H) 10/23/2017   Lab Results  Component Value Date   TRIG 84 10/23/2017   Lab Results  Component Value Date   CHOLHDL 2.7 10/23/2017   No results found for: HGBA1C     Assessment & Plan:   1. Essential (primary) hypertension  - CBC with Differential/Platelet - Comprehensive metabolic panel - TSH - Lipid panel  2. Mitral valve insufficiency, unspecified etiology  - CBC with Differential/Platelet - Comprehensive metabolic panel - TSH  3. Hypokalemia  - Comprehensive metabolic panel  4. Pure hypercholesterolemia  - Comprehensive  metabolic panel - TSH - Lipid panel  5. Anxiety reaction  - LORazepam (ATIVAN) 0.5 MG tablet; Take 1 tablet by mouth 1 hour prior to procedure. May repeat upon arrival if BP elevated.  Dispense: 10 tablet; Refill: 0     Am the supervising physician for Fannie Knee, PA and I am signing off on this unfinished note with no further clinical information Wilhemena Durie., MD   Juluis Mire, CMA

## 2020-03-11 DIAGNOSIS — Z96652 Presence of left artificial knee joint: Secondary | ICD-10-CM | POA: Diagnosis not present

## 2020-03-12 DIAGNOSIS — I340 Nonrheumatic mitral (valve) insufficiency: Secondary | ICD-10-CM | POA: Diagnosis not present

## 2020-03-12 DIAGNOSIS — R0600 Dyspnea, unspecified: Secondary | ICD-10-CM | POA: Diagnosis not present

## 2020-03-12 DIAGNOSIS — I341 Nonrheumatic mitral (valve) prolapse: Secondary | ICD-10-CM | POA: Diagnosis not present

## 2020-03-22 NOTE — Progress Notes (Addendum)
Subjective:   Molly Mitchell is a 75 y.o. female who presents for Medicare Annual (Subsequent) preventive examination.  I connected with Molly Mitchell today by telephone and verified that I am speaking with the correct person using two identifiers. Location patient: home Location provider: work Persons participating in the virtual visit: patient, provider.   I discussed the limitations, risks, security and privacy concerns of performing an evaluation and management service by telephone and the availability of in person appointments. I also discussed with the patient that there may be a patient responsible charge related to this service. The patient expressed understanding and verbally consented to this telephonic visit.    Interactive audio and video telecommunications were attempted between this provider and patient, however failed, due to patient having technical difficulties OR patient did not have access to video capability.  We continued and completed visit with audio only.   Review of Systems    N/A  Cardiac Risk Factors include: advanced age (>41men, >2 women);dyslipidemia;hypertension     Objective:    There were no vitals filed for this visit. There is no height or weight on file to calculate BMI.  Advanced Directives 03/23/2020 03/11/2019 03/05/2019 01/30/2018 10/02/2017 03/29/2017 12/18/2016  Does Patient Have a Medical Advance Directive? Yes Yes Yes Yes Yes Yes Yes  Type of Paramedic of [place];Living will [place];Living will [place];Living will [place];Living will [place];Living will Living will [place];Living will  Does patient want to make changes to medical advance directive? - - No - Patient declined No - Patient declined - No - Patient declined No - Patient declined  Copy of [place] in Chart? Yes - validated most recent copy  scanned in chart (See row information) Yes - validated most recent copy scanned in chart (See row information) No - copy requested No - copy requested Yes No - copy requested Yes  Would patient like information on creating a medical advance directive? - - - - - - -    Current Medications (verified) Outpatient Encounter Medications as of 03/23/2020  Medication Sig   alendronate (FOSAMAX) 70 MG tablet Take 70 mg by mouth once a week.   amoxicillin (AMOXIL) 500 MG capsule Take 4 tablets by mouth 1 hour prior to dental procedures.   Ascorbic Acid (VITAMIN C) 1000 MG tablet Take 1,000 mg by mouth daily.    Calcium-Magnesium 500-250 MG TABS Take 2 tablets by mouth daily.   carvedilol (COREG) 3.125 MG tablet Take 3.125 mg by mouth 2 (two) times daily with a meal.   Cholecalciferol (VITAMIN D3) 1000 UNITS CAPS Take 1,000 Units by mouth daily.    meloxicam (MOBIC) 15 MG tablet TAKE 1 TABLET EVERY DAY   Multiple Vitamins-Minerals (MULTIVITAMIN WITH MINERALS) tablet Take 3 tablets by mouth daily.   naproxen sodium (ALEVE) 220 MG tablet Take 220 mg by mouth daily as needed.    nystatin-triamcinolone ointment (MYCOLOG) USE 1 TOPICAL APPLICATION TWICE DAILY (Patient taking differently: as needed.)   Omega-3 1000 MG CAPS Take by mouth daily at 6 (six) AM. Plan to stop taking in a couple days.   pravastatin (PRAVACHOL) 40 MG tablet Take 40 mg by mouth at bedtime.   valsartan (DIOVAN) 320 MG tablet Take 320 mg by mouth daily.   vitamin E 400 UNIT capsule Take 400 Units by mouth daily.    LORazepam (ATIVAN) 0.5 MG tablet Take 1 tablet by  mouth 1 hour prior to procedure. May repeat upon arrival if BP elevated. (Patient not taking: Reported on 03/23/2020)   No facility-administered encounter medications on file as of 03/23/2020.    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 ([place]) 09/07/2017   Past Surgical History:  Procedure Laterality Date   ABDOMINAL HYSTERECTOMY  1981   AIKEN OSTEOTOMY Right 01/30/2018   Procedure: WEIL RIGHT 2ND AND 3RD;  Surgeon: [name], DPM;  Location: [facility];  Service: Podiatry;  Laterality: Right;   CORRECTION HAMMER TOE     HAMMER TOE SURGERY Right 01/30/2018   Procedure: HAMMER TOE CORRECTION SECOND AND THIRD;  Surgeon: [name], DPM;  Location: [facility];  Service: Podiatry;  Laterality: Right;  GENERAL WITH LOCAL   HAMMER TOE SURGERY Left 03/05/2019   Procedure: HAMMER TOE CORRECTION T1 AND T2;  Surgeon: [name], DPM;  Location: [facility];  Service: Podiatry;  Laterality: Left;   JOINT REPLACEMENT Left 03/03/2013   MRI     ROTATOR CUFF REPAIR Right 1981   SPINAL FUSION  2001   lower back    SPINAL FUSION  2003   neck   TOTAL KNEE REVISION Left 07/31/2016   Procedure: TOTAL KNEE REVISION;  Surgeon: Dereck Leep, MD;  Location: [facility];  Service: Orthopedics;  Laterality: Left;   WEIL OSTEOTOMY Left 03/05/2019   Procedure: WEIL OSTEOTOMY X 2 LEFT;  Surgeon: [name], DPM;  Location: [facility];  Service: Podiatry;  Laterality: Left;   Family History  Problem Relation Age of Onset   Hypertension Brother    Hypertension Mother    Hypertension Sister    Cancer Sister    Diabetes Paternal Grandmother    Heart failure Maternal Grandmother    Social History   Socioeconomic History   Marital status: Married    Spouse name: Not on file   Number of children: 2   Years of education: Not on file   Highest education level: High school graduate  Occupational History   Occupation: retired  Tobacco Use   Smoking status: Former Smoker    Quit date:  04/17/1979    Years since quitting: 40.9   Smokeless tobacco: Never Used  Vaping Use   Vaping Use: Never used  Substance and Sexual Activity   Alcohol use: No    Alcohol/week: 0.0 standard drinks   Drug use: No   Sexual activity: Not on file  Other Topics Concern   Not on file  Social History Narrative   Not on file   Social Determinants of Health   Financial Resource Strain: Low Risk    Difficulty of Paying Living Expenses: Not hard at all  Food Insecurity: No Food Insecurity   Worried About Charity fundraiser in the Last Year: Never true   [place] in the Last Year: Never true  Transportation Needs: No Transportation Needs   Lack of Transportation (Medical): No   Lack of Transportation (Non-Medical): No  Physical Activity: Sufficiently Active   Days of Exercise per Week: 3 days   Minutes  of Exercise per Session: 60 min  Stress: No Stress Concern Present   Feeling of Stress : Not at all  Social Connections: Socially Integrated   Frequency of Communication with Friends and Family: Never   Frequency of Social Gatherings with Friends and Family: More than three times a week   Attends Religious Services: More than 4 times per year   Active Member of [employer] or Organizations: Yes   Attends Music therapist: More than 4 times per year   Marital Status: Married    Tobacco Counseling Counseling given: Not Answered   Clinical Intake:  Pre-visit preparation completed: Yes  Pain : No/denies pain     Nutritional Risks: None Diabetes: No  How often do you need to have someone help you when you read instructions, pamphlets, or other written materials from your doctor or pharmacy?: 1 - Never  Diabetic? No  Interpreter Needed?: No  Information entered by :: [facility], LPN   Activities of Daily Living In your present state of health, do you have any difficulty performing the following activities: 03/23/2020 01/26/2020  Hearing? N N  Vision? N N   Difficulty concentrating or making decisions? N N  Walking or climbing stairs? N N  Dressing or bathing? N N  Doing errands, shopping? N N  Preparing Food and eating ? N -  Using the Toilet? N -  In the past six months, have you accidently leaked urine? N -  Do you have problems with loss of bowel control? N -  Managing your Medications? N -  Managing your Finances? N -  Housekeeping or managing your Housekeeping? N -  Some recent data might be hidden    Patient Care Team: Chrismon, Driscilla Grammes as PCP - General (Physician Assistant) Bryson Ha, OD as Consulting Physician (Optometry) Marry Guan, Laurice Record, MD as Consulting Physician (Orthopedic Surgery) Corey Skains, MD as Consulting Physician (Cardiology) Newman Pies, MD as Consulting Physician (Neurosurgery)  Indicate any recent Medical Services you may have received from other than [facility] providers in the past year (date may be approximate).     Assessment:   This is a routine wellness examination for Smt.  Hearing/Vision screen No exam data present  Dietary issues and exercise activities discussed: Current Exercise Habits: Structured exercise class, Type of exercise: treadmill;walking;strength training/weights;Other - see comments (dance class), Time (Minutes): > 60, Frequency (Times/Week): 3, Weekly Exercise (Minutes/Week): 0, Intensity: Moderate, Exercise limited by: None identified  Goals      DIET - REDUCE CALORIE INTAKE     Recommend to continue current diet plan of not eating more than 1200 calories a day and watching carbohydrate intake.      Weight (lb) < 130 lb (59 kg)     Recommend cutting down carbohydrates by 45-50 grams a day to help aid in weight loss of 30 lbs.        Depression Screen PHQ 2/9 Scores 03/23/2020 01/26/2020 03/11/2019 10/23/2017 10/02/2017 10/02/2017 09/22/2016  PHQ - 2 Score 0 0 0 0 0 0 0  PHQ- 9 Score - - - - 3 - 2  Exception Documentation - - - - - - -  Not completed - - - - -  - -    Fall Risk Fall Risk  03/23/2020 01/26/2020 03/11/2019 10/23/2017 10/02/2017  Falls in the past year? 0 0 0 No No  Number falls in past yr: 0 0 0 - -  Injury with Fall? 0 0 0 - -  FALL RISK PREVENTION PERTAINING TO THE HOME:  Any stairs in or around the home? No  If so, are there any without handrails? No  Home free of loose throw rugs in walkways, pet beds, electrical cords, etc? Yes  Adequate lighting in your home to reduce risk of falls? Yes   ASSISTIVE DEVICES UTILIZED TO PREVENT FALLS:  Life alert? No  Use of a cane, walker or w/c? No  Grab bars in the bathroom? No  Shower chair or bench in shower? No  Elevated toilet seat or a handicapped toilet? No    Cognitive Function: Normal cognitive status assessed by direct observation by this Nurse Health Advisor. No abnormalities found.       6CIT Screen 09/22/2016  What Year? 0 points  What month? 0 points  What time? 0 points  Count back from 20 0 points  Months in reverse 0 points  Repeat phrase 0 points  Total Score 0    Immunizations Immunization History  Administered Date(s) Administered   Influenza Split 11/23/2011   Influenza-Unspecified 11/23/2011, 10/16/2012    TDAP status: Due, Education has been provided regarding the importance of this vaccine. Advised may receive this vaccine at local pharmacy or Health Dept. Aware to provide a copy of the vaccination record if obtained from local pharmacy or Health Dept. Verbalized acceptance and understanding.  Flu Vaccine status: Declined, Education has been provided regarding the importance of this vaccine but patient still declined. Advised may receive this vaccine at local pharmacy or Health Dept. Aware to provide a copy of the vaccination record if obtained from local pharmacy or Health Dept. Verbalized acceptance and understanding.  Pneumococcal vaccine status: Declined,  Education has been provided regarding the importance of this vaccine but patient still  declined. Advised may receive this vaccine at local pharmacy or Health Dept. Aware to provide a copy of the vaccination record if obtained from local pharmacy or Health Dept. Verbalized acceptance and understanding.   Covid-19 vaccine status: Declined, Education has been provided regarding the importance of this vaccine but patient still declined. Advised may receive this vaccine at local pharmacy or Health Dept.or vaccine clinic. Aware to provide a copy of the vaccination record if obtained from local pharmacy or Health Dept. Verbalized acceptance and understanding.  Qualifies for Shingles Vaccine? Yes   Zostavax completed No   Shingrix Completed?: No.    Education has been provided regarding the importance of this vaccine. Patient has been advised to call insurance company to determine out of pocket expense if they have not yet received this vaccine. Advised may also receive vaccine at local pharmacy or Health Dept. Verbalized acceptance and understanding.  Screening Tests Health Maintenance  Topic Date Due   MAMMOGRAM  11/09/2019   COVID-19 Vaccine (1) 04/08/2020 (Originally 02/23/1957)   INFLUENZA VACCINE  04/15/2020 (Originally 08/17/2019)   PNA vac Low Risk Adult (1 of 2 - PCV13) 03/23/2021 (Originally 02/24/2011)   DEXA SCAN  02/21/2022   Fecal DNA (Cologuard)  04/22/2022   Hepatitis C Screening  Completed   HPV VACCINES  Aged Out   TETANUS/TDAP  Discontinued    Health Maintenance  Health Maintenance Due  Topic Date Due   MAMMOGRAM  11/09/2019    Colorectal cancer screening: Type of screening: Cologuard. Completed 04/22/19. Repeat every 3 years  Mammogram status: Ordered today. Pt provided with contact info and advised to call to schedule appt.   Bone Density status: Completed 02/21/17. Results reflect: Bone density results: OSTEOPENIA. Repeat every 5 years.  Lung Cancer Screening: (Low Dose CT Chest recommended if Age 80-80 years, 30 pack-year currently smoking OR have quit w/in  15years.) does not qualify.   Additional Screening:  Hepatitis C Screening: Up to date  Vision Screening: Recommended annual ophthalmology exams for early detection of glaucoma and other disorders of the eye. Is the patient up to date with their annual eye exam?  Yes  Who is the provider or what is the name of the office in which the patient attends annual eye exams? Dr Kerin Ransom If pt is not established with a provider, would they like to be referred to a provider to establish care? No .   Dental Screening: Recommended annual dental exams for proper oral hygiene  Community Resource Referral / Chronic Care Management: CRR required this visit?  No   CCM required this visit?  No      Plan:     I have personally reviewed and noted the following in the patients chart:   Medical and social history Use of alcohol, tobacco or illicit drugs  Current medications and supplements Functional ability and status Nutritional status Physical activity Advanced directives List of other physicians Hospitalizations, surgeries, and ER visits in previous 12 months Vitals Screenings to include cognitive, depression, and falls Referrals and appointments  In addition, I have reviewed and discussed with patient certain preventive protocols, quality metrics, and best practice recommendations. A written personalized care plan for preventive services as well as general preventive health recommendations were provided to patient.     Neev Mcmains [place], [place]   02/25/5619   Nurse Notes: Pt declined a flu, pneumonia or future Covid vaccine.   Reviewed note and plan from Nurse Heath Advisor's screening. Agree with documentation and plan.

## 2020-03-23 ENCOUNTER — Ambulatory Visit (INDEPENDENT_AMBULATORY_CARE_PROVIDER_SITE_OTHER): Payer: Medicare HMO

## 2020-03-23 ENCOUNTER — Other Ambulatory Visit: Payer: Self-pay

## 2020-03-23 DIAGNOSIS — Z0000 Encounter for general adult medical examination without abnormal findings: Secondary | ICD-10-CM

## 2020-03-23 DIAGNOSIS — Z1231 Encounter for screening mammogram for malignant neoplasm of breast: Secondary | ICD-10-CM | POA: Diagnosis not present

## 2020-03-23 NOTE — Patient Instructions (Signed)
Molly Mitchell , Thank you for taking time to come for your Medicare Wellness Visit. I appreciate your ongoing commitment to your health goals. Please review the following plan we discussed and let me know if I can assist you in the future.   Screening recommendations/referrals: Colonoscopy: Cologuard up to date, due 04/22/22  Mammogram: Currently due, ordered today. Pt aware she needs to call and schedule apt.  Bone Density: Up to date, due 02/2022 Recommended yearly ophthalmology/optometry visit for glaucoma screening and checkup Recommended yearly dental visit for hygiene and checkup  Vaccinations: Influenza vaccine: Currently due, declined receiving.  Pneumococcal vaccine: Currently due, declined receiving.  Tdap vaccine: Currently due, declined receiving.  Shingles vaccine: Shingrix discussed. Please contact your pharmacy for coverage information.     Advanced directives: Currently on file.   Conditions/risks identified: Recommend to continue current diet plan of not eating more than 1200 calories a day and watching carbohydrate intake.   Next appointment: 05/06/20 @ 1:20 PM with [employer] 65 Years and Older, Female Preventive care refers to lifestyle choices and visits with your health care provider that can promote health and wellness. What does preventive care include?  A yearly physical exam. This is also called an annual well check.  Dental exams once or twice a year.  Routine eye exams. Ask your health care provider how often you should have your eyes checked.  Personal lifestyle choices, including:  Daily care of your teeth and gums.  Regular physical activity.  Eating a healthy diet.  Avoiding tobacco and drug use.  Limiting alcohol use.  Practicing safe sex.  Taking low-dose aspirin every day.  Taking vitamin and mineral supplements as recommended by your health care provider. What happens during an annual well check? The services and  screenings done by your health care provider during your annual well check will depend on your age, overall health, lifestyle risk factors, and family history of disease. Counseling  Your health care provider may ask you questions about your:  Alcohol use.  Tobacco use.  Drug use.  Emotional well-being.  Home and relationship well-being.  Sexual activity.  Eating habits.  History of falls.  Memory and ability to understand (cognition).  Work and work Statistician.  Reproductive health. Screening  You may have the following tests or measurements:  Height, weight, and BMI.  Blood pressure.  Lipid and cholesterol levels. These may be checked every 5 years, or more frequently if you are over 68 years old.  Skin check.  Lung cancer screening. You may have this screening every year starting at age 16 if you have a 30-pack-year history of smoking and currently smoke or have quit within the past 15 years.  Fecal occult blood test (FOBT) of the stool. You may have this test every year starting at age 16.  Flexible sigmoidoscopy or colonoscopy. You may have a sigmoidoscopy every 5 years or a colonoscopy every 10 years starting at age 56.  Hepatitis C blood test.  Hepatitis B blood test.  Sexually transmitted disease (STD) testing.  Diabetes screening. This is done by checking your blood sugar (glucose) after you have not eaten for a while (fasting). You may have this done every 1-3 years.  Bone density scan. This is done to screen for osteoporosis. You may have this done starting at age 46.  Mammogram. This may be done every 1-2 years. Talk to your health care provider about how often you should have regular mammograms. Talk with your  health care provider about your test results, treatment options, and if necessary, the need for more tests. Vaccines  Your health care provider may recommend certain vaccines, such as:  Influenza vaccine. This is recommended every  year.  Tetanus, diphtheria, and acellular pertussis (Tdap, Td) vaccine. You may need a Td booster every 10 years.  Zoster vaccine. You may need this after age 62.  Pneumococcal 13-valent conjugate (PCV13) vaccine. One dose is recommended after age 55.  Pneumococcal polysaccharide (PPSV23) vaccine. One dose is recommended after age 76. Talk to your health care provider about which screenings and vaccines you need and how often you need them. This information is not intended to replace advice given to you by your health care provider. Make sure you discuss any questions you have with your health care provider. Document Released: 01/29/2015 Document Revised: 09/22/2015 Document Reviewed: 11/03/2014 Elsevier Interactive Patient Education  2017 [place] Prevention in the Home Falls can cause injuries. They can happen to people of all ages. There are many things you can do to make your home safe and to help prevent falls. What can I do on the outside of my home?  Regularly fix the edges of walkways and driveways and fix any cracks.  Remove anything that might make you trip as you walk through a door, such as a raised step or threshold.  Trim any bushes or trees on the path to your home.  Use bright outdoor lighting.  Clear any walking paths of anything that might make someone trip, such as rocks or tools.  Regularly check to see if handrails are loose or broken. Make sure that both sides of any steps have handrails.  Any raised decks and porches should have guardrails on the edges.  Have any leaves, snow, or ice cleared regularly.  Use sand or salt on walking paths during winter.  Clean up any spills in your garage right away. This includes oil or grease spills. What can I do in the bathroom?  Use night lights.  Install grab bars by the toilet and in the tub and shower. Do not use towel bars as grab bars.  Use non-skid mats or decals in the tub or shower.  If you  need to sit down in the shower, use a plastic, non-slip stool.  Keep the floor dry. Clean up any water that spills on the floor as soon as it happens.  Remove soap buildup in the tub or shower regularly.  Attach bath mats securely with double-sided non-slip rug tape.  Do not have throw rugs and other things on the floor that can make you trip. What can I do in the bedroom?  Use night lights.  Make sure that you have a light by your bed that is easy to reach.  Do not use any sheets or blankets that are too big for your bed. They should not hang down onto the floor.  Have a firm chair that has side arms. You can use this for support while you get dressed.  Do not have throw rugs and other things on the floor that can make you trip. What can I do in the kitchen?  Clean up any spills right away.  Avoid walking on wet floors.  Keep items that you use a lot in easy-to-reach places.  If you need to reach something above you, use a strong step stool that has a grab bar.  Keep electrical cords out of the way.  Do not use  floor polish or wax that makes floors slippery. If you must use wax, use non-skid floor wax.  Do not have throw rugs and other things on the floor that can make you trip. What can I do with my stairs?  Do not leave any items on the stairs.  Make sure that there are handrails on both sides of the stairs and use them. Fix handrails that are broken or loose. Make sure that handrails are as long as the stairways.  Check any carpeting to make sure that it is firmly attached to the stairs. Fix any carpet that is loose or worn.  Avoid having throw rugs at the top or bottom of the stairs. If you do have throw rugs, attach them to the floor with carpet tape.  Make sure that you have a light switch at the top of the stairs and the bottom of the stairs. If you do not have them, ask someone to add them for you. What else can I do to help prevent falls?  Wear shoes  that:  Do not have high heels.  Have rubber bottoms.  Are comfortable and fit you well.  Are closed at the toe. Do not wear sandals.  If you use a stepladder:  Make sure that it is fully opened. Do not climb a closed stepladder.  Make sure that both sides of the stepladder are locked into place.  Ask someone to hold it for you, if possible.  Clearly mark and make sure that you can see:  Any grab bars or handrails.  First and last steps.  Where the edge of each step is.  Use tools that help you move around (mobility aids) if they are needed. These include:  Canes.  Walkers.  Scooters.  Crutches.  Turn on the lights when you go into a dark area. Replace any light bulbs as soon as they burn out.  Set up your furniture so you have a clear path. Avoid moving your furniture around.  If any of your floors are uneven, fix them.  If there are any pets around you, be aware of where they are.  Review your medicines with your doctor. Some medicines can make you feel dizzy. This can increase your chance of falling. Ask your doctor what other things that you can do to help prevent falls. This information is not intended to replace advice given to you by your health care provider. Make sure you discuss any questions you have with your health care provider. Document Released: 10/29/2008 Document Revised: 06/10/2015 Document Reviewed: 02/06/2014 Elsevier Interactive Patient Education  2017 [employer].

## 2020-04-01 DIAGNOSIS — I340 Nonrheumatic mitral (valve) insufficiency: Secondary | ICD-10-CM | POA: Diagnosis not present

## 2020-04-01 DIAGNOSIS — I519 Heart disease, unspecified: Secondary | ICD-10-CM | POA: Insufficient documentation

## 2020-04-01 DIAGNOSIS — I10 Essential (primary) hypertension: Secondary | ICD-10-CM | POA: Diagnosis not present

## 2020-04-01 DIAGNOSIS — E782 Mixed hyperlipidemia: Secondary | ICD-10-CM | POA: Diagnosis not present

## 2020-05-06 ENCOUNTER — Encounter: Payer: Medicare HMO | Admitting: Family Medicine

## 2020-05-13 DIAGNOSIS — I519 Heart disease, unspecified: Secondary | ICD-10-CM | POA: Diagnosis not present

## 2020-05-13 DIAGNOSIS — I10 Essential (primary) hypertension: Secondary | ICD-10-CM | POA: Diagnosis not present

## 2020-05-31 DIAGNOSIS — I10 Essential (primary) hypertension: Secondary | ICD-10-CM | POA: Diagnosis not present

## 2020-05-31 DIAGNOSIS — E7800 Pure hypercholesterolemia, unspecified: Secondary | ICD-10-CM | POA: Diagnosis not present

## 2020-05-31 DIAGNOSIS — I340 Nonrheumatic mitral (valve) insufficiency: Secondary | ICD-10-CM | POA: Diagnosis not present

## 2020-05-31 DIAGNOSIS — E876 Hypokalemia: Secondary | ICD-10-CM | POA: Diagnosis not present

## 2020-06-01 LAB — COMPREHENSIVE METABOLIC PANEL
ALT: 20 IU/L (ref 0–32)
AST: 18 IU/L (ref 0–40)
Albumin/Globulin Ratio: 1.9 (ref 1.2–2.2)
Albumin: 4.4 g/dL (ref 3.7–4.7)
Alkaline Phosphatase: 82 IU/L (ref 44–121)
BUN/Creatinine Ratio: 26 (ref 12–28)
BUN: 25 mg/dL (ref 8–27)
Bilirubin Total: 0.5 mg/dL (ref 0.0–1.2)
CO2: 24 mmol/L (ref 20–29)
Calcium: 9.1 mg/dL (ref 8.7–10.3)
Chloride: 101 mmol/L (ref 96–106)
Creatinine, Ser: 0.98 mg/dL (ref 0.57–1.00)
Globulin, Total: 2.3 g/dL (ref 1.5–4.5)
Glucose: 94 mg/dL (ref 65–99)
Potassium: 5.1 mmol/L (ref 3.5–5.2)
Sodium: 139 mmol/L (ref 134–144)
Total Protein: 6.7 g/dL (ref 6.0–8.5)
eGFR: 61 mL/min/{1.73_m2} (ref >59)

## 2020-06-01 LAB — TSH: TSH: 1.6 u[IU]/mL (ref 0.450–4.500)

## 2020-06-01 LAB — CBC WITH DIFFERENTIAL/PLATELET
Basophils Absolute: 0.1 10*3/uL (ref 0.0–0.2)
Basos: 1 %
EOS (ABSOLUTE): 0.2 10*3/uL (ref 0.0–0.4)
Eos: 2 %
Hematocrit: 39.8 % (ref 34.0–46.6)
Hemoglobin: 13.8 g/dL (ref 11.1–15.9)
Immature Grans (Abs): 0 10*3/uL (ref 0.0–0.1)
Immature Granulocytes: 0 %
Lymphocytes Absolute: 2.6 10*3/uL (ref 0.7–3.1)
Lymphs: 36 %
MCH: 31.4 pg (ref 26.6–33.0)
MCHC: 34.7 g/dL (ref 31.5–35.7)
MCV: 91 fL (ref 79–97)
Monocytes Absolute: 0.7 10*3/uL (ref 0.1–0.9)
Monocytes: 10 %
Neutrophils Absolute: 3.7 10*3/uL (ref 1.4–7.0)
Neutrophils: 51 %
Platelets: 260 10*3/uL (ref 150–450)
RBC: 4.39 x10E6/uL (ref 3.77–5.28)
RDW: 13.7 % (ref 11.7–15.4)
WBC: 7.2 10*3/uL (ref 3.4–10.8)

## 2020-06-01 LAB — LIPID PANEL
Chol/HDL Ratio: 2.9 ratio (ref 0.0–4.4)
Cholesterol, Total: 220 mg/dL — ABNORMAL HIGH (ref 100–199)
HDL: 76 mg/dL (ref >39)
LDL Chol Calc (NIH): 127 mg/dL — ABNORMAL HIGH (ref 0–99)
Triglycerides: 100 mg/dL (ref 0–149)
VLDL Cholesterol Cal: 17 mg/dL (ref 5–40)

## 2020-06-03 ENCOUNTER — Other Ambulatory Visit: Payer: Self-pay

## 2020-06-03 ENCOUNTER — Ambulatory Visit (INDEPENDENT_AMBULATORY_CARE_PROVIDER_SITE_OTHER): Payer: Medicare HMO | Admitting: Family Medicine

## 2020-06-03 VITALS — BP 144/54 | HR 76 | Ht 62.5 in | Wt 165.0 lb

## 2020-06-03 DIAGNOSIS — E7800 Pure hypercholesterolemia, unspecified: Secondary | ICD-10-CM | POA: Diagnosis not present

## 2020-06-03 DIAGNOSIS — Z96652 Presence of left artificial knee joint: Secondary | ICD-10-CM

## 2020-06-03 DIAGNOSIS — I10 Essential (primary) hypertension: Secondary | ICD-10-CM

## 2020-06-03 DIAGNOSIS — Z0000 Encounter for general adult medical examination without abnormal findings: Secondary | ICD-10-CM

## 2020-06-03 NOTE — Progress Notes (Signed)
Complete physical exam   Patient: Molly Mitchell   DOB: Jun 15, 1945   75 y.o. Female  MRN: [id number] Visit Date: 06/03/2020  Today's healthcare provider: Dortha Kern, PA-C   No chief complaint on file.  Subjective    Molly Mitchell is a 75 y.o. female who presents today for a complete physical exam.  She reports consuming a general diet.  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  She generally feels well. She reports sleeping well. She does not have additional problems to discuss today.   Patient would like to get mammogram but wants to wait until her husband is on schedule for a procedure. Patient does not want a breast examination today.  Hypertension, follow-up  BP Readings from Last 3 Encounters:  02/12/20 (!) 159/73  01/26/20 (!) 166/75  03/05/19 135/61   Wt Readings from Last 3 Encounters:  02/12/20 161 lb (73 kg)  01/26/20 163 lb (73.9 kg)  03/05/19 169 lb (76.7 kg)     She was last seen for hypertension 4 months ago.  BP at that visit was 159/73. Management since that visit includes none.  She reports excellent compliance with treatment. She is not having side effects.  She is following a Regular diet. She is exercising. She does not smoke.  Use of agents associated with hypertension: none.   Outside blood pressures are 120s-160s/60s-70s. Symptoms: No chest pain No chest pressure  No palpitations No syncope  No dyspnea No orthopnea  No paroxysmal nocturnal dyspnea No lower extremity edema   Pertinent labs: Lab Results  Component Value Date   CHOL 220 (H) 05/31/2020   HDL 76 05/31/2020   LDLCALC 127 (H) 05/31/2020   TRIG 100 05/31/2020   CHOLHDL 2.9 05/31/2020   Lab Results  Component Value Date   NA 139 05/31/2020   K 5.1 05/31/2020   CREATININE 0.98 05/31/2020   GFRNONAA 61 10/23/2017   GFRAA 71 10/23/2017   GLUCOSE 94 05/31/2020     The 10-year ASCVD risk score Denman George DC Jr., et al.,  2013) is: 34.5%   --------------------------------------------------------------------------------------------------- Lipid/Cholesterol, Follow-up  Last lipid panel Other pertinent labs  Lab Results  Component Value Date   CHOL 220 (H) 05/31/2020   HDL 76 05/31/2020   LDLCALC 127 (H) 05/31/2020   TRIG 100 05/31/2020   CHOLHDL 2.9 05/31/2020   Lab Results  Component Value Date   ALT 20 05/31/2020   AST 18 05/31/2020   PLT 260 05/31/2020   TSH 1.600 05/31/2020     She was last seen for this 4 months ago.  Management since that visit includes none.  She reports excellent compliance with treatment. She is not having side effects.   Symptoms: No chest pain No chest pressure/discomfort  No dyspnea No lower extremity edema  Yes numbness or tingling of extremity - when she raises arms she feels weakness in legs No orthopnea  No palpitations No paroxysmal nocturnal dyspnea  No speech difficulty No syncope   Current diet: in general, a "healthy" diet   Current exercise: walking, weightlifting, yard work and dancing  The 10-year ASCVD risk score Denman George DC Montez Hageman., et al., 2013) is: 34.5%  ---------------------------------------------------------------------------------------------------   Past Medical History:  Diagnosis Date   Arthritis    Heart murmur    as a child some dr say they hear it some say they don't   HLD (hyperlipidemia)    Hypertension    Neuromuscular disorder (  [place])    weakness in legs when getting up   Stenosis of artery in neck ([place]) 09/07/2017   Past Surgical History:  Procedure Laterality Date   ABDOMINAL HYSTERECTOMY  1981   AIKEN OSTEOTOMY Right 01/30/2018   Procedure: WEIL RIGHT 2ND AND 3RD;  Surgeon: [name], DPM;  Location: [facility];  Service: Podiatry;  Laterality: Right;   CORRECTION HAMMER TOE     HAMMER TOE SURGERY Right 01/30/2018   Procedure: HAMMER TOE CORRECTION SECOND AND THIRD;  Surgeon: [name], DPM;  Location:  [facility];  Service: Podiatry;  Laterality: Right;  GENERAL WITH LOCAL   HAMMER TOE SURGERY Left 03/05/2019   Procedure: HAMMER TOE CORRECTION T1 AND T2;  Surgeon: [name], DPM;  Location: [facility];  Service: Podiatry;  Laterality: Left;   JOINT REPLACEMENT Left 03/03/2013   MRI     ROTATOR CUFF REPAIR Right 1981   SPINAL FUSION  2001   lower back    SPINAL FUSION  2003   neck   TOTAL KNEE REVISION Left 07/31/2016   Procedure: TOTAL KNEE REVISION;  Surgeon: Dereck Leep, MD;  Location: [facility];  Service: Orthopedics;  Laterality: Left;   WEIL OSTEOTOMY Left 03/05/2019   Procedure: WEIL OSTEOTOMY X 2 LEFT;  Surgeon: [name], DPM;  Location: [facility];  Service: Podiatry;  Laterality: Left;   Social History   Socioeconomic History   Marital status: Married    Spouse name: Not on file   Number of children: 2   Years of education: Not on file   Highest education level: High school graduate  Occupational History   Occupation: retired  Tobacco Use   Smoking status: Former Smoker    Quit date: 04/17/1979    Years since quitting: 41.1   Smokeless tobacco: Never Used  Vaping Use   Vaping Use: Never used  Substance and Sexual Activity   Alcohol use: No    Alcohol/week: 0.0 standard drinks   Drug use: No   Sexual activity: Not on file  Other Topics Concern   Not on file  Social History Narrative   Not on file   Social Determinants of Health   Financial Resource Strain: Low Risk    Difficulty of Paying Living Expenses: Not hard at all  Food Insecurity: No Food Insecurity   Worried About Charity fundraiser in the Last Year: Never true   [place] in the Last Year: Never true  Transportation Needs: No Transportation Needs   Lack of Transportation (Medical): No   Lack of Transportation (Non-Medical): No  Physical Activity: Sufficiently Active   Days of Exercise per Week: 3 days   Minutes of Exercise per Session: 60 min  Stress:  No Stress Concern Present   Feeling of Stress : Not at all  Social Connections: Socially Integrated   Frequency of Communication with Friends and Family: Never   Frequency of Social Gatherings with Friends and Family: More than three times a week   Attends Religious Services: More than 4 times per year   Active Member of [employer] or Organizations: Yes   Attends Music therapist: More than 4 times per year   Marital Status: Married  Human resources officer Violence: Not At Risk   Fear of Current or Ex-Partner: No   Emotionally Abused: No   Physically Abused: No   Sexually Abused: No   Family Status  Relation Name Status   Brother  Alive  Mother  Deceased   Sister  Alive   Sister  Alive   Father  Deceased   PGM  (Not Specified)   MGM  (Not Specified)   Family History  Problem Relation Age of Onset   Hypertension Brother    Hypertension Mother    Hypertension Sister    Cancer Sister    Diabetes Paternal Grandmother    Heart failure Maternal Grandmother    Allergies  Allergen Reactions   Amitriptyline Hcl Swelling and Other (See Comments)    Facial swelling   Duricef [Cefadroxil] Swelling and Other (See Comments)    Facial swelling   Levofloxacin Other (See Comments)    Severe Shoulder Pain   Tetanus Toxoid Swelling and Other (See Comments)    Arm swelling   Tramadol Itching   Erythromycin Rash and Other (See Comments)    Rash on bottom    Patient Care Team: Alieyah Spader, Vickki Muff, PA-C as PCP - General (Physician Assistant) Bryson Ha, OD as Consulting Physician (Optometry) Hooten, Laurice Record, MD as Consulting Physician (Orthopedic Surgery) Corey Skains, MD as Consulting Physician (Cardiology) Newman Pies, MD as Consulting Physician (Neurosurgery)   Medications: Outpatient Medications Prior to Visit  Medication Sig   alendronate (FOSAMAX) 70 MG tablet Take 70 mg by mouth once a week.   amoxicillin (AMOXIL) 500 MG capsule Take 4 tablets by mouth 1  hour prior to dental procedures.   Ascorbic Acid (VITAMIN C) 1000 MG tablet Take 1,000 mg by mouth daily.    Calcium-Magnesium 500-250 MG TABS Take 2 tablets by mouth daily.   carvedilol (COREG) 3.125 MG tablet Take 3.125 mg by mouth 2 (two) times daily with a meal.   Cholecalciferol (VITAMIN D3) 1000 UNITS CAPS Take 1,000 Units by mouth daily.    LORazepam (ATIVAN) 0.5 MG tablet Take 1 tablet by mouth 1 hour prior to procedure. May repeat upon arrival if BP elevated. (Patient not taking: Reported on 03/23/2020)   meloxicam (MOBIC) 15 MG tablet TAKE 1 TABLET EVERY DAY   Multiple Vitamins-Minerals (MULTIVITAMIN WITH MINERALS) tablet Take 3 tablets by mouth daily.   naproxen sodium (ALEVE) 220 MG tablet Take 220 mg by mouth daily as needed.    nystatin-triamcinolone ointment (MYCOLOG) USE 1 TOPICAL APPLICATION TWICE DAILY (Patient taking differently: as needed.)   Omega-3 1000 MG CAPS Take by mouth daily at 6 (six) AM. Plan to stop taking in a couple days.   pravastatin (PRAVACHOL) 40 MG tablet Take 40 mg by mouth at bedtime.   valsartan (DIOVAN) 320 MG tablet Take 320 mg by mouth daily.   vitamin E 400 UNIT capsule Take 400 Units by mouth daily.    No facility-administered medications prior to visit.    Review of Systems  Musculoskeletal: Positive for arthralgias, back pain, neck pain and neck stiffness.       Objective    BP (!) 144/54 (BP Location: Left Arm, Patient Position: Sitting, Cuff Size: Normal)   Pulse 76   Ht 5' 2.5" (1.588 m)   Wt 165 lb (74.8 kg)   SpO2 100%   BMI 29.70 kg/m     Physical Exam Constitutional:      Appearance: She is well-developed.  HENT:     Head: Normocephalic and atraumatic.     Right Ear: External ear normal.     Left Ear: External ear normal.     Nose: Nose normal.  Eyes:     General:        Right  eye: No discharge.     Conjunctiva/sclera: Conjunctivae normal.     Pupils: Pupils are equal, round, and reactive to light.  Neck:      Thyroid: No thyromegaly.     Trachea: No tracheal deviation.  Cardiovascular:     Rate and Rhythm: Normal rate and regular rhythm.     Heart sounds: Normal heart sounds. No murmur heard.   Pulmonary:     Effort: Pulmonary effort is normal. No respiratory distress.     Breath sounds: Normal breath sounds. No wheezing or rales.  Chest:     Chest wall: No tenderness.  Abdominal:     General: There is no distension.     Palpations: Abdomen is soft. There is no mass.     Tenderness: There is no abdominal tenderness. There is no guarding or rebound.  Musculoskeletal:        General: No tenderness. Normal range of motion.     Cervical back: Normal range of motion and neck supple.  Lymphadenopathy:     Cervical: No cervical adenopathy.  Skin:    General: Skin is warm and dry.     Findings: No erythema or rash.  Neurological:     Mental Status: She is alert and oriented to person, place, and time.     Cranial Nerves: No cranial nerve deficit.     Motor: No abnormal muscle tone.     Coordination: Coordination normal.     Deep Tendon Reflexes: Reflexes are normal and symmetric. Reflexes normal.  Psychiatric:        Behavior: Behavior normal.        Thought Content: Thought content normal.        Judgment: Judgment normal.     Last depression screening scores PHQ 2/9 Scores 03/23/2020 01/26/2020 03/11/2019  PHQ - 2 Score 0 0 0  PHQ- 9 Score - - -  Exception Documentation - - -  Not completed - - -   Last fall risk screening Fall Risk  03/23/2020  Falls in the past year? 0  Number falls in past yr: 0  Injury with Fall? 0   Last Audit-C alcohol use screening Alcohol Use Disorder Test (AUDIT) 03/23/2020  1. How often do you have a drink containing alcohol? 0  2. How many drinks containing alcohol do you have on a typical day when you are drinking? 0  3. How often do you have six or more drinks on one occasion? 0  AUDIT-C Score 0  Alcohol Brief Interventions/Follow-up AUDIT Score <7  follow-up not indicated   A score of 3 or more in women, and 4 or more in men indicates increased risk for alcohol abuse, EXCEPT if all of the points are from question 1   No results found for any visits on 06/03/20.  Assessment & Plan    Routine Health Maintenance and Physical Exam  Exercise Activities and Dietary recommendations Goals      DIET - REDUCE CALORIE INTAKE     Recommend to continue current diet plan of not eating more than 1200 calories a day and watching carbohydrate intake.      Weight (lb) < 130 lb (59 kg)     Recommend cutting down carbohydrates by 45-50 grams a day to help aid in weight loss of 30 lbs.         Immunization History  Administered Date(s) Administered   Influenza Split 11/23/2011   Influenza-Unspecified 11/23/2011, 10/16/2012    Health Maintenance  Topic Date  Due   COVID-19 Vaccine (1) Never done   MAMMOGRAM  11/09/2019   PNA vac Low Risk Adult (1 of 2 - PCV13) 03/23/2021 (Originally 02/24/2011)   INFLUENZA VACCINE  08/16/2020   DEXA SCAN  02/21/2022   Fecal DNA (Cologuard)  04/22/2022   Hepatitis C Screening  Completed   HPV VACCINES  Aged Out   TETANUS/TDAP  Discontinued    Discussed health benefits of physical activity, and encouraged her to engage in regular exercise appropriate for her age and condition.  1. Annual physical exa   2. Essential (primary) hypertension   3.Pure hypercholesterolemia  4. Status post total left knee replacement     No follow-ups on file.     I, Versie Fleener, PA-C, have reviewed all documentation for this visit. The documentation on 06/03/20 for the exam, diagnosis, procedures, and orders are all accurate and complete.  Am the supervising physician for Fannie Knee, PA and I am signing off on this unfinished note with no further clinical information Wilhemena Durie., MD   [name], PA-C  [facility] [phone] (phone) [phone] (fax)  [place]

## 2020-07-04 ENCOUNTER — Encounter: Payer: Self-pay | Admitting: Family Medicine

## 2020-08-03 DIAGNOSIS — M1711 Unilateral primary osteoarthritis, right knee: Secondary | ICD-10-CM | POA: Diagnosis not present

## 2020-09-13 DIAGNOSIS — H524 Presbyopia: Secondary | ICD-10-CM | POA: Diagnosis not present

## 2020-10-02 ENCOUNTER — Encounter: Payer: Self-pay | Admitting: Family Medicine

## 2020-10-13 ENCOUNTER — Other Ambulatory Visit: Payer: Self-pay | Admitting: *Deleted

## 2020-10-14 ENCOUNTER — Other Ambulatory Visit: Payer: Self-pay | Admitting: Family Medicine

## 2020-10-14 MED ORDER — MELOXICAM 15 MG PO TABS: 15.0000 mg | ORAL_TABLET | Freq: Every day | ORAL | 0 refills | Status: DC

## 2020-10-14 NOTE — Telephone Encounter (Signed)
Medication Refill - Medication: Meloxicam  Has the patient contacted their pharmacy? No. Pt states that she was not sure how to transfer the prescription to a new pharmacy. Please advise.  (Agent: If no, request that the patient contact the pharmacy for the refill.) (Agent: If yes, when and what did the pharmacy advise?)  Preferred Pharmacy ( [facility], [address]  [address] [place] [address]  Phone: [phone] Fax: [phone]  Hours: Not open 24 hours  with phone number or street name):  Has the patient been seen for an appointment in the last year OR does the patient have an upcoming appointment? Yes.    Agent: Please be advised that RX refills may take up to 3 business days. We ask that you follow-up with your pharmacy.

## 2020-10-27 DIAGNOSIS — I519 Heart disease, unspecified: Secondary | ICD-10-CM | POA: Diagnosis not present

## 2020-10-27 DIAGNOSIS — I10 Essential (primary) hypertension: Secondary | ICD-10-CM | POA: Diagnosis not present

## 2020-11-04 DIAGNOSIS — R69 Illness, unspecified: Secondary | ICD-10-CM | POA: Diagnosis not present

## 2020-11-04 DIAGNOSIS — M810 Age-related osteoporosis without current pathological fracture: Secondary | ICD-10-CM | POA: Diagnosis not present

## 2020-11-04 DIAGNOSIS — E785 Hyperlipidemia, unspecified: Secondary | ICD-10-CM | POA: Diagnosis not present

## 2020-11-04 DIAGNOSIS — Z6831 Body mass index (BMI) 31.0-31.9, adult: Secondary | ICD-10-CM | POA: Diagnosis not present

## 2020-11-04 DIAGNOSIS — K5900 Constipation, unspecified: Secondary | ICD-10-CM | POA: Diagnosis not present

## 2020-11-04 DIAGNOSIS — I951 Orthostatic hypotension: Secondary | ICD-10-CM | POA: Diagnosis not present

## 2020-11-04 DIAGNOSIS — Z008 Encounter for other general examination: Secondary | ICD-10-CM | POA: Diagnosis not present

## 2020-11-04 DIAGNOSIS — I10 Essential (primary) hypertension: Secondary | ICD-10-CM | POA: Diagnosis not present

## 2020-11-04 DIAGNOSIS — G8929 Other chronic pain: Secondary | ICD-10-CM | POA: Diagnosis not present

## 2020-11-04 DIAGNOSIS — M1990 Unspecified osteoarthritis, unspecified site: Secondary | ICD-10-CM | POA: Diagnosis not present

## 2020-11-04 DIAGNOSIS — E669 Obesity, unspecified: Secondary | ICD-10-CM | POA: Diagnosis not present

## 2020-11-09 ENCOUNTER — Telehealth: Payer: Self-pay

## 2020-11-09 NOTE — Telephone Encounter (Signed)
Copied from [place] [id number]. Topic: Medical Record Request - Patient ROI Request >> Nov 09, 2020  3:26 PM Tessa Lerner A wrote: Patient Name/DOB/MRN #: Molly Mitchell / 1945/08/13 /  [id number] Requestor Name/Agency: Dr. Tressia Miners / [facility] Bryant  Call Back #: [phone] Information Requested: All records    Route to [facility] for [place] clinics. For all other clinics, route to the clinic's PEC Pool.

## 2020-11-12 NOTE — Telephone Encounter (Signed)
Pt advised need signed ROI. TNP

## 2020-11-22 DIAGNOSIS — M545 Low back pain, unspecified: Secondary | ICD-10-CM | POA: Diagnosis not present

## 2020-11-22 DIAGNOSIS — M533 Sacrococcygeal disorders, not elsewhere classified: Secondary | ICD-10-CM | POA: Diagnosis not present

## 2020-11-23 ENCOUNTER — Other Ambulatory Visit: Payer: Self-pay | Admitting: Orthopedic Surgery

## 2020-11-23 DIAGNOSIS — M545 Low back pain, unspecified: Secondary | ICD-10-CM

## 2020-12-16 ENCOUNTER — Other Ambulatory Visit: Payer: Self-pay

## 2020-12-16 ENCOUNTER — Encounter
Admission: RE | Admit: 2020-12-16 | Discharge: 2020-12-16 | Disposition: A | Discharge status: Home / Self Care | Payer: Medicare HMO | Source: Ambulatory Visit | Attending: Orthopedic Surgery | Admitting: Orthopedic Surgery

## 2020-12-16 DIAGNOSIS — Z981 Arthrodesis status: Secondary | ICD-10-CM | POA: Diagnosis not present

## 2020-12-16 DIAGNOSIS — Z9889 Other specified postprocedural states: Secondary | ICD-10-CM | POA: Diagnosis not present

## 2020-12-16 DIAGNOSIS — M545 Low back pain, unspecified: Secondary | ICD-10-CM | POA: Diagnosis not present

## 2020-12-16 IMAGING — NM NM BONE W/ SPECT
1 series · 12 of 14 positions shown, 15 images · non-contrast
Comparison: Lumbar spine radiographs [DATE]. Lumbar MRI
[DATE].

CLINICAL DATA: Chronic back pain. History of back surgery in [KG]
and [KG]. No recent injury. No given history of malignancy.
Sacroplasty [DATE].

EXAM:
NM BONE SCAN AND SPECT IMAGING
TECHNIQUE: After intravenous injection of radiopharmaceutical, delayed planar
images were obtained in multiple projections. Additionally, delayed
triplanar SPECT images were obtained through the area of interest
(lumbar spine and pelvis).
RADIOPHARMACEUTICALS:  21.1 mCi [KG] MDP

[Series 4: 3d bone 1.25 b70s · axial · 0.98mm/px · z∈[+1298,+1626]mm · 12 of 555 slices shown, 15 images]
[im 43/555  soft-tissue]
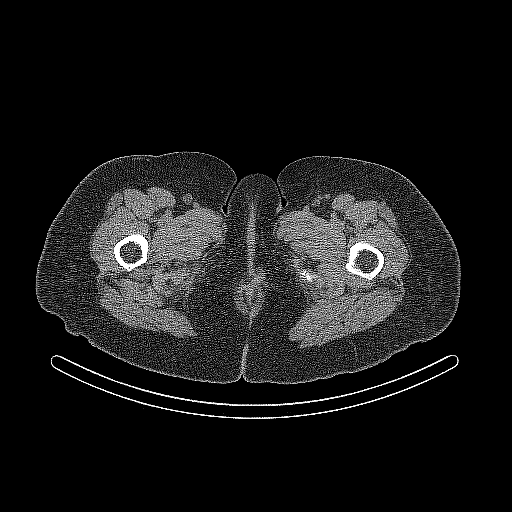
[im 43/555  bone]
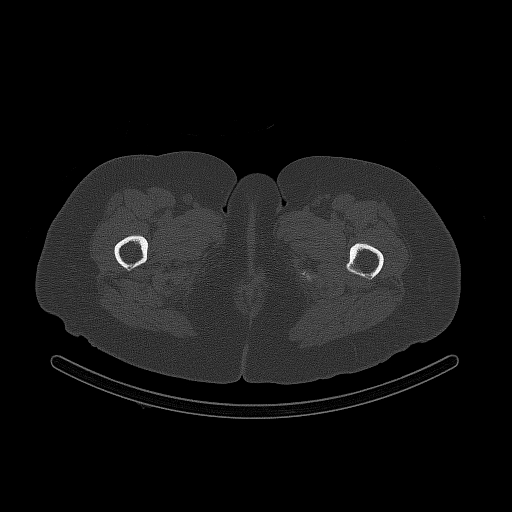
[im 86/555  bone]
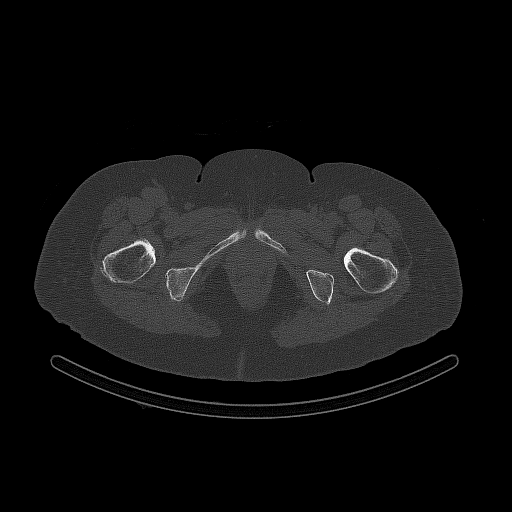
[im 128/555  bone]
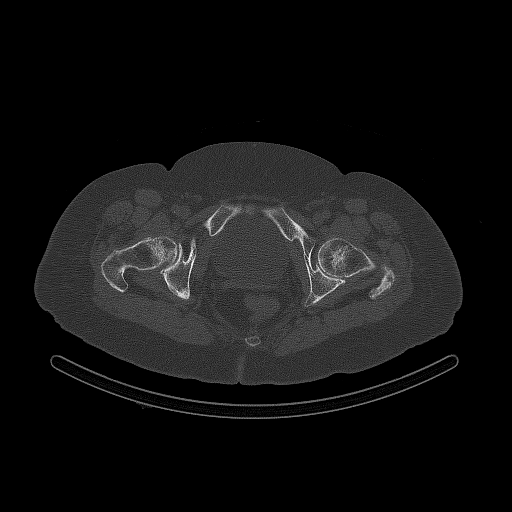
[im 171/555  bone]
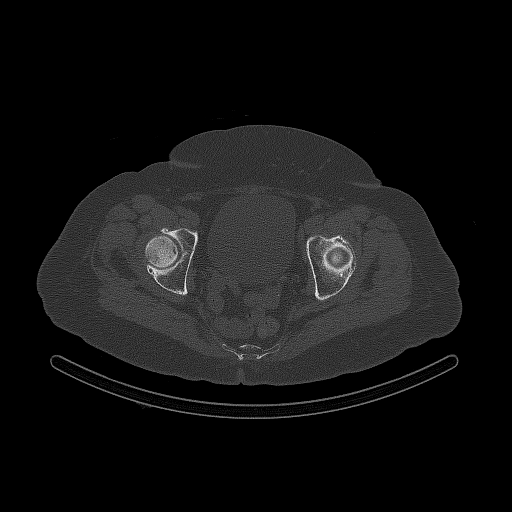
[im 214/555  soft-tissue]
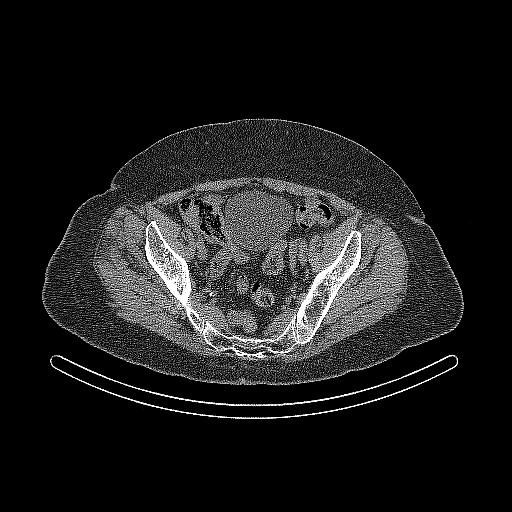
[im 214/555  bone]
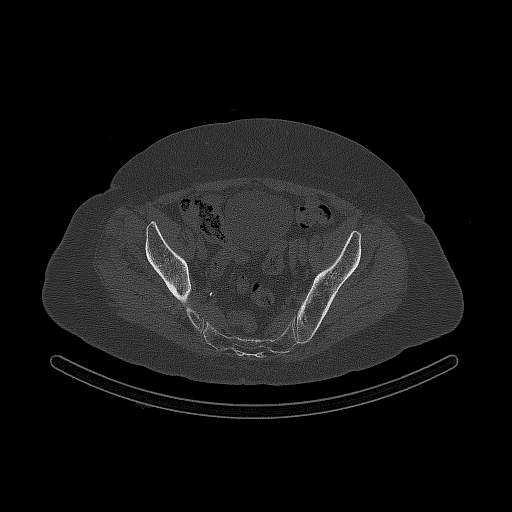
[im 256/555  bone]
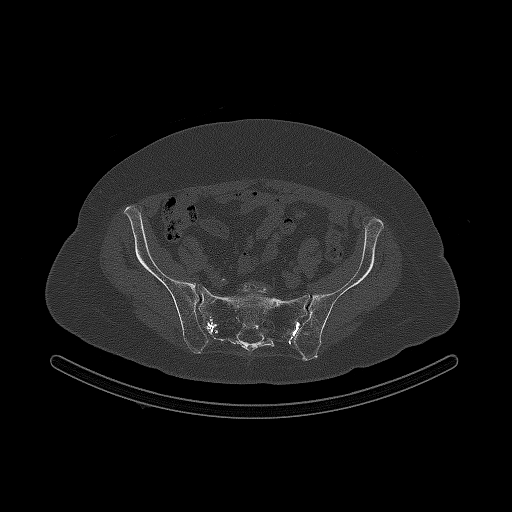
[im 299/555  bone]
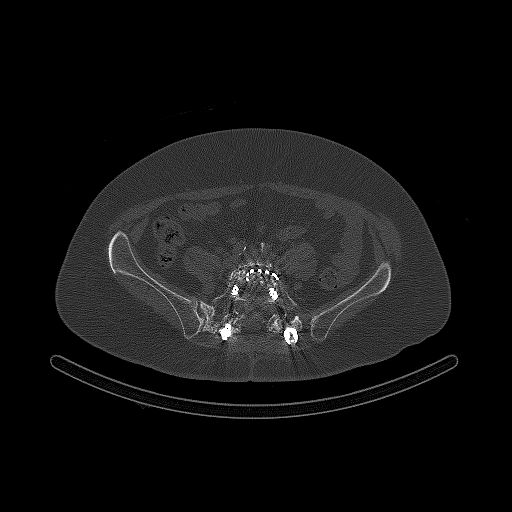
[im 341/555  bone]
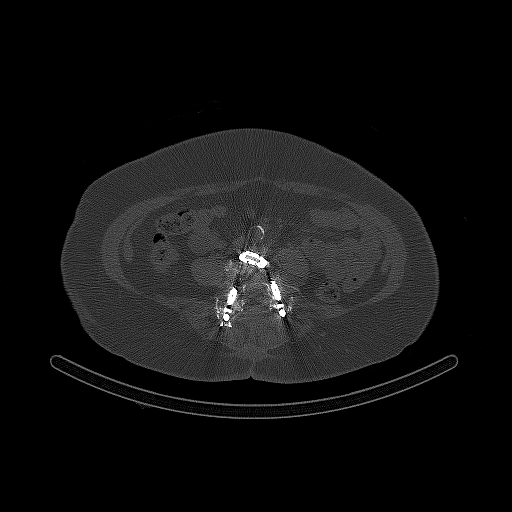
[im 384/555  soft-tissue]
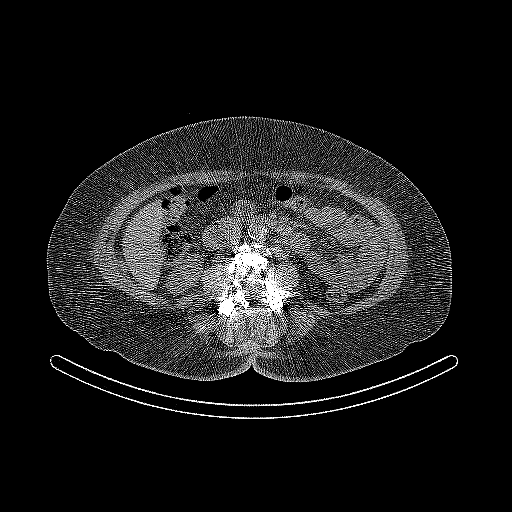
[im 384/555  bone]
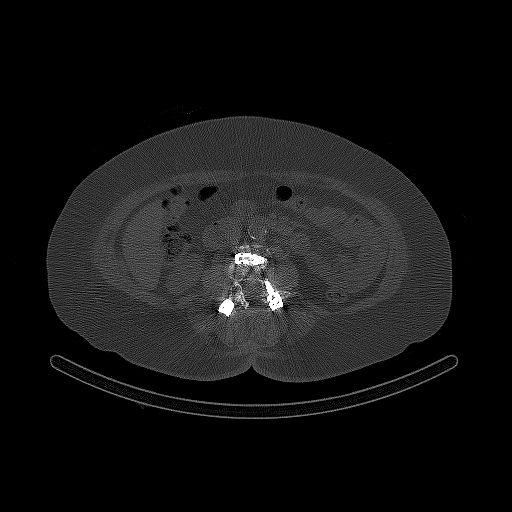
[im 427/555  bone]
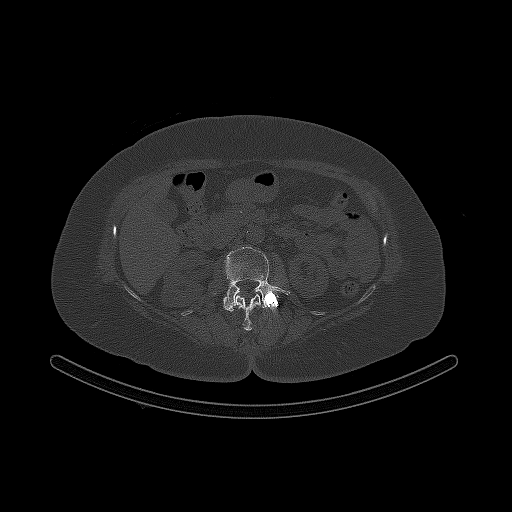
[im 469/555  bone]
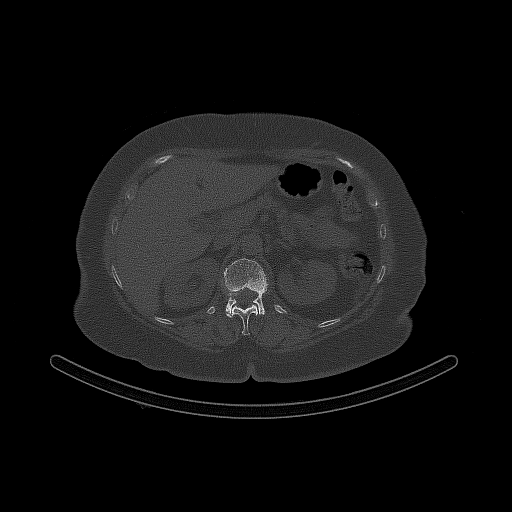
[im 512/555  bone]
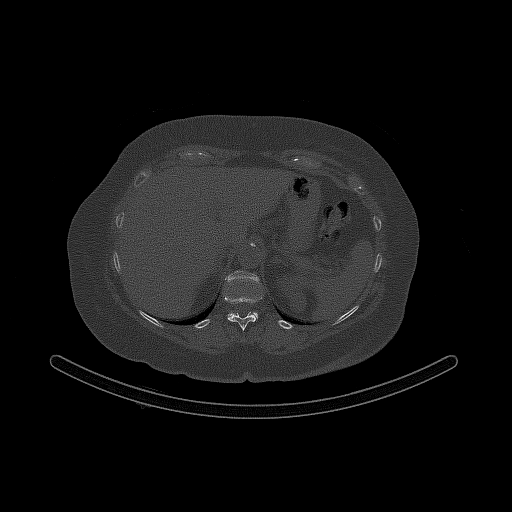

[12 of 14 positions shown; findings below may reference images not displayed]

FINDINGS: Again demonstrated are extensive postsurgical changes status post L2
through S1 posterior decompression and PLIF. There is focally
increased uptake at the T12-L1 disc space which is asymmetric to the
left and associated with progressive loss of disc height from the
previous MRI. There is low-level uptake in the spine at the level of
the L2-3 and L3-4 interbody spacers which may indicate incomplete
interbody fusion. An apparent old fracture of the upper sacrum is
not associated with significant uptake and appears healed. There are
bilateral sacroplasty changes. There is some uptake in the region of
the sacroiliac joints bilaterally, especially inferiorly on the
right. No other suspicious uptake within the bony pelvis or hips.
IMPRESSION: 1. Focally increased uptake at the T12-L1 disc space, asymmetric to
the left and associated with progressive disc space narrowing
compared with previous MRI. This is likely degenerative and could be
further evaluated with follow-up MRI if clinically warranted.
2. Low level uptake around the L2-3 and L3-4 interbody spacers which
may indicate incomplete interbody fusion.
3. Nonspecific uptake around the sacroiliac joints bilaterally. No
significant uptake within the previously treated sacral fractures.

## 2020-12-16 MED ORDER — TECHNETIUM TC 99M MEDRONATE IV KIT
20.0000 | PACK | Freq: Once | INTRAVENOUS | Status: AC | PRN
  Administered 2020-12-16: 21.1 via INTRAVENOUS

## 2020-12-30 DIAGNOSIS — M47816 Spondylosis without myelopathy or radiculopathy, lumbar region: Secondary | ICD-10-CM | POA: Diagnosis not present

## 2020-12-30 DIAGNOSIS — M5416 Radiculopathy, lumbar region: Secondary | ICD-10-CM | POA: Diagnosis not present

## 2020-12-30 DIAGNOSIS — M4316 Spondylolisthesis, lumbar region: Secondary | ICD-10-CM | POA: Diagnosis not present

## 2021-01-11 ENCOUNTER — Other Ambulatory Visit: Payer: Self-pay | Admitting: Student

## 2021-01-11 DIAGNOSIS — M4316 Spondylolisthesis, lumbar region: Secondary | ICD-10-CM

## 2021-01-27 ENCOUNTER — Ambulatory Visit
Admission: RE | Admit: 2021-01-27 | Discharge: 2021-01-27 | Disposition: A | Discharge status: Home / Self Care | Payer: Medicare HMO | Source: Ambulatory Visit | Attending: Student | Admitting: Student

## 2021-01-27 DIAGNOSIS — M5126 Other intervertebral disc displacement, lumbar region: Secondary | ICD-10-CM | POA: Diagnosis not present

## 2021-01-27 DIAGNOSIS — M4316 Spondylolisthesis, lumbar region: Secondary | ICD-10-CM

## 2021-01-27 DIAGNOSIS — M48061 Spinal stenosis, lumbar region without neurogenic claudication: Secondary | ICD-10-CM | POA: Diagnosis not present

## 2021-01-27 DIAGNOSIS — M5136 Other intervertebral disc degeneration, lumbar region: Secondary | ICD-10-CM | POA: Diagnosis not present

## 2021-01-27 IMAGING — MR MR LUMBAR SPINE WO/W CM
5 of 7 series · 29 of 48 positions shown · IV contrast (multihance)
Comparison: Lumbar spine x-rays dated [DATE] MRI lumbar
spine dated [DATE].

CLINICAL DATA: Chronic lower back pain radiating down both legs to
the feet for the past 6 months. History of prior surgery.

EXAM:
MRI LUMBAR SPINE WITHOUT AND WITH CONTRAST
TECHNIQUE: Multiplanar and multiecho pulse sequences of the lumbar spine were
obtained without and with intravenous contrast.
CONTRAST:  15mL MULTIHANCE GADOBENATE DIMEGLUMINE 529 MG/ML IV SOLN

[Series 5: T1 · sagittal · 4.0mm · 0.41mm/px · 5 of 14 slices shown (1 of 2)]
[im 1/14]
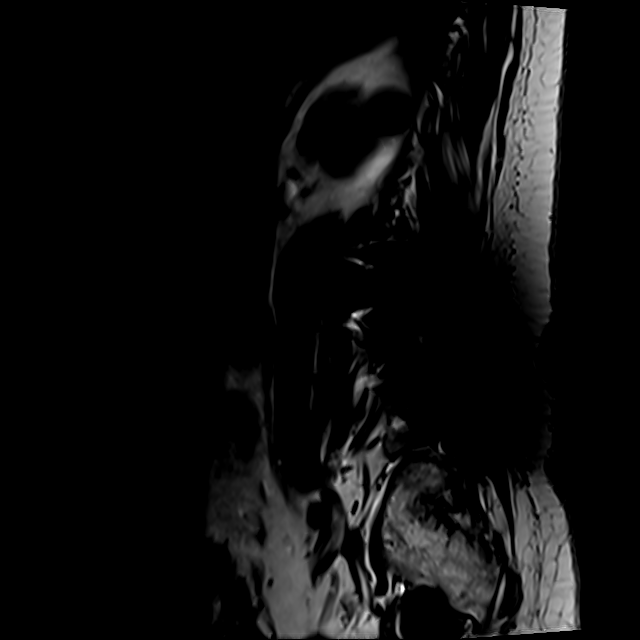
[im 4/14]
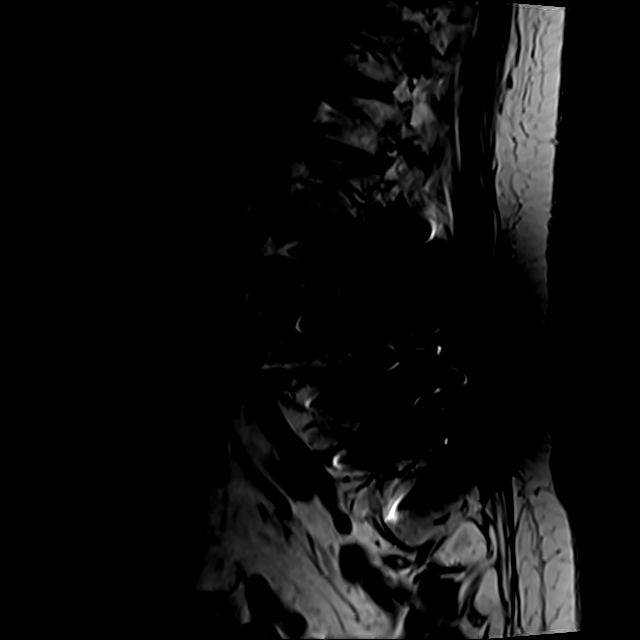
[im 7/14]
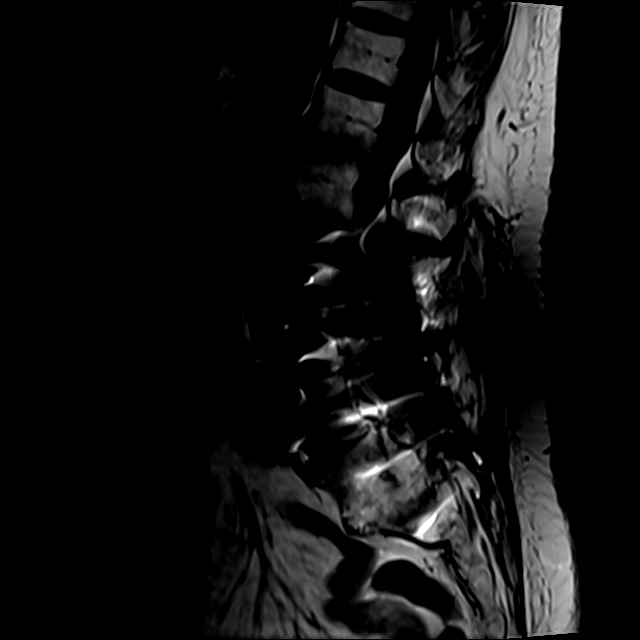
[im 10/14]
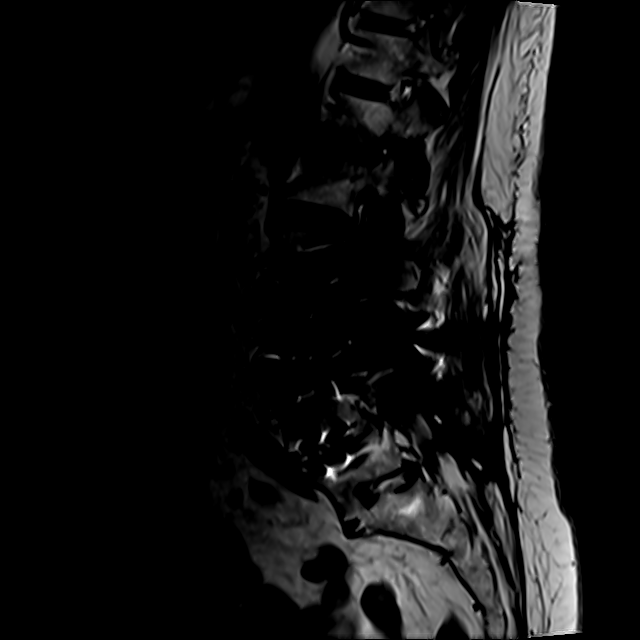
[im 14/14]
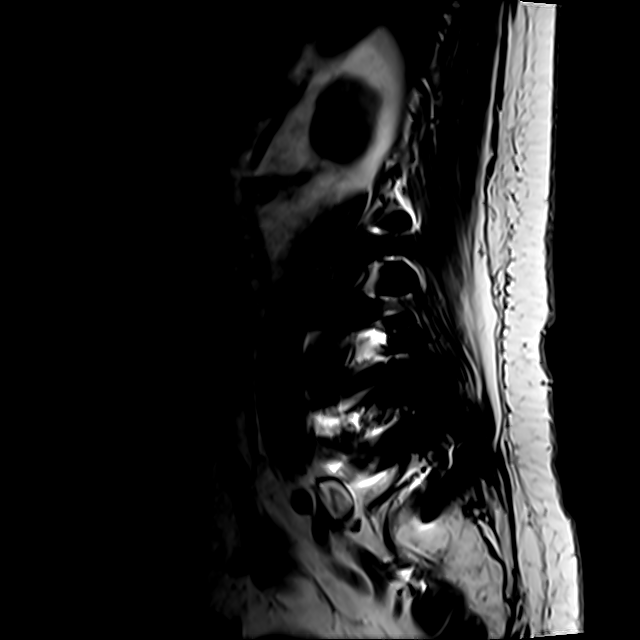

[Series 6: T2 · sagittal · 4.0mm · 1.02mm/px · 4 of 14 slices shown (1 of 2)]
[im 1/14]
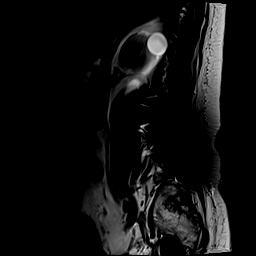
[im 5/14]
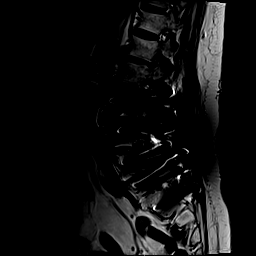
[im 9/14]
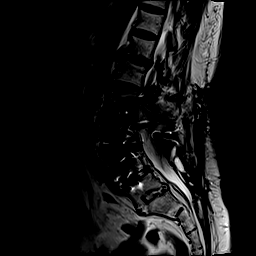
[im 14/14]
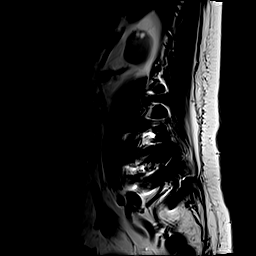

[Series 7: T1 · axial · 4.0mm · 0.78mm/px · z∈[+1,+189]mm · 8 of 35 slices shown (2 of 2)]
[im 1/35]
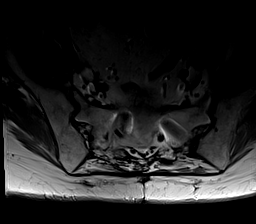
[im 4/35]
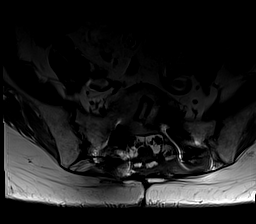
[im 12/35]
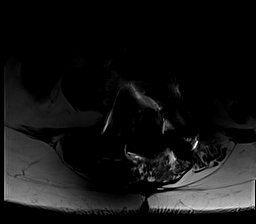
[im 16/35]
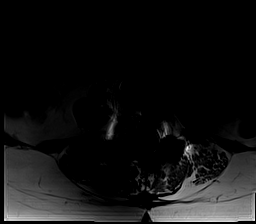
[im 19/35]
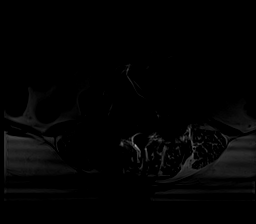
[im 23/35]
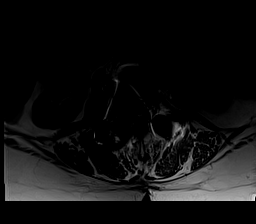
[im 31/35]
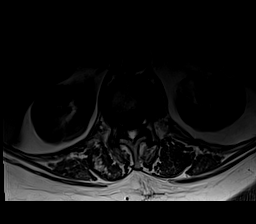
[im 35/35]
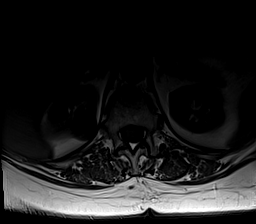

[Series 8: T2 · axial · 4.0mm · 0.78mm/px · z∈[-2,+189]mm · 8 of 35 slices shown (2 of 2)]
[im 1/35]
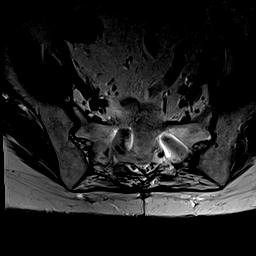
[im 4/35]
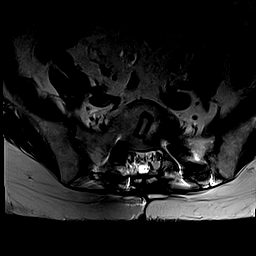
[im 12/35]
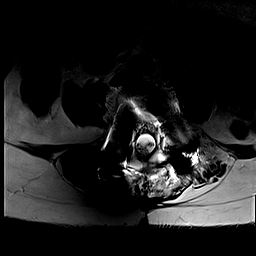
[im 16/35]
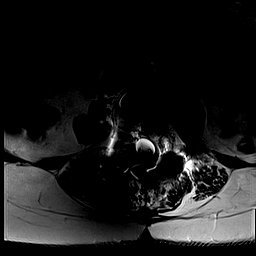
[im 19/35]
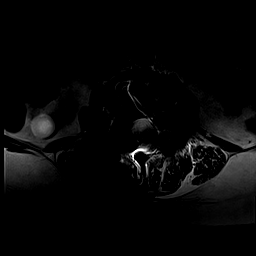
[im 23/35]
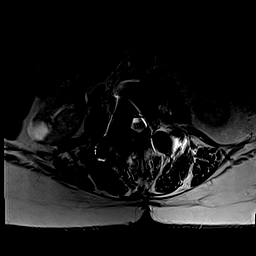
[im 31/35]
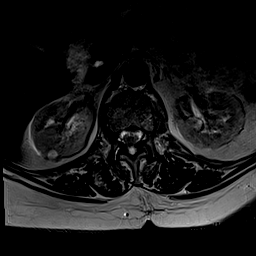
[im 35/35]
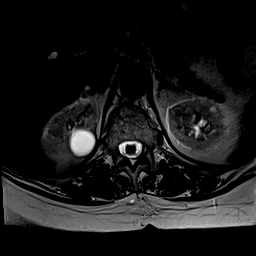

[Series 9: T1 fat-sat post-contrast · sagittal · 4.0mm · 0.41mm/px · 4 of 14 slices shown]
[im 1/14]
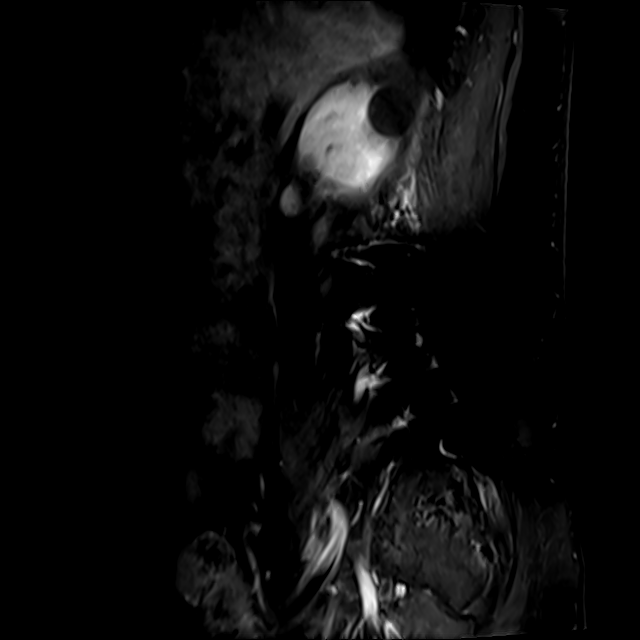
[im 5/14]
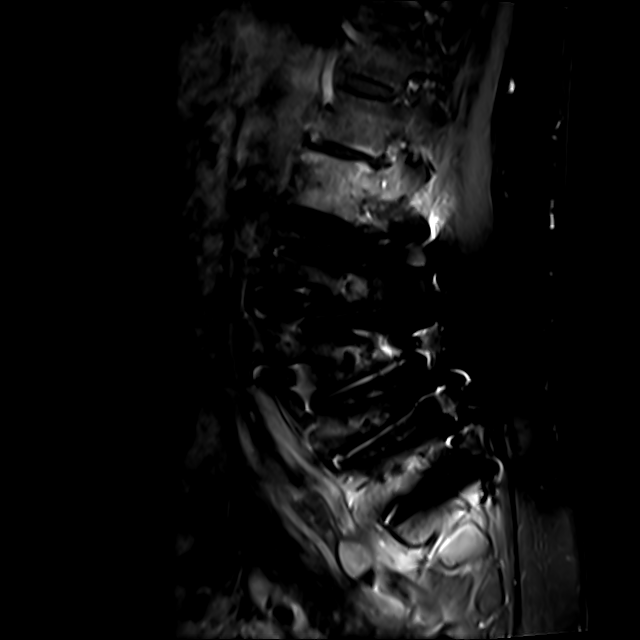
[im 9/14]
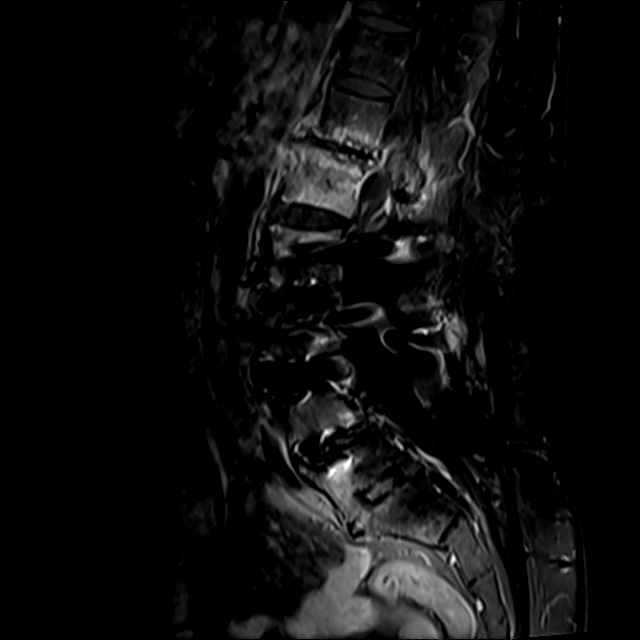
[im 14/14]
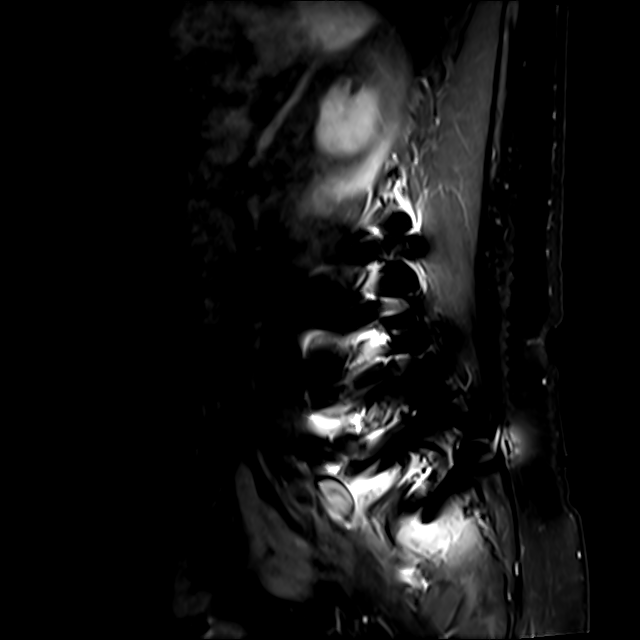

[29 of 48 positions shown; findings below may reference images not displayed]

FINDINGS: Segmentation:  Standard.

Alignment: Unchanged mild retrolisthesis at T12-L1 and L1-L2.
Unchanged diffuse mild anterolisthesis at L4-L5.

Vertebrae: No fracture, evidence of discitis, or bone lesion. Prior
L2-S1 PLIF. Degenerative endplate marrow edema asymmetric to the
left at T12-L1, progressed since the prior MRI.

Conus medullaris and cauda equina: Conus extends to the L1-L2 level.
Conus and cauda equina appear normal. No intrathecal enhancement.

Paraspinal and other soft tissues: Bilateral renal cysts again
noted. Otherwise negative.

Disc levels:

T11-T12: Negative.

T12-L1: Progressive asymmetric left-sided disc height loss and
moderate disc bulging with unchanged superimposed small central disc
protrusion. Progressive mild to moderate bilateral facet
arthropathy. Unchanged mild spinal canal stenosis. Progressive now
moderate bilateral neuroforaminal stenosis.

L1-L2: Unchanged mild disc bulging. Advanced bilateral facet
arthropathy again noted with improved asymmetric right-sided
ligamentum flavum hypertrophy which previously narrowed the right
lateral recess. Now mild to moderate spinal canal and right lateral
recess stenosis, improved from the prior study. Unchanged moderate
bilateral neuroforaminal stenosis.

L2-L3 to L5-S1: Prior posterior decompression and PLIF. No residual
stenosis.
IMPRESSION: 1. Prior L2-S1 PLIF with improved adjacent segment disease at L1-L2.
Now mild to moderate spinal canal and right lateral recess stenosis
has improved. Unchanged moderate bilateral neuroforaminal stenosis.
2. Progressive asymmetric left-sided degenerative disc disease at
T12-L1 with worsening now moderate bilateral neuroforaminal
stenosis. Unchanged mild spinal canal stenosis.

## 2021-01-27 MED ORDER — GADOBENATE DIMEGLUMINE 529 MG/ML IV SOLN
15.0000 mL | Freq: Once | INTRAVENOUS | Status: AC | PRN
  Administered 2021-01-27: 15 mL via INTRAVENOUS

## 2021-01-28 ENCOUNTER — Other Ambulatory Visit: Payer: Medicare HMO

## 2021-02-03 DIAGNOSIS — M4316 Spondylolisthesis, lumbar region: Secondary | ICD-10-CM | POA: Diagnosis not present

## 2021-02-03 DIAGNOSIS — M5416 Radiculopathy, lumbar region: Secondary | ICD-10-CM | POA: Diagnosis not present

## 2021-02-15 ENCOUNTER — Other Ambulatory Visit: Payer: Self-pay | Admitting: Student

## 2021-02-15 DIAGNOSIS — M4316 Spondylolisthesis, lumbar region: Secondary | ICD-10-CM

## 2021-02-20 ENCOUNTER — Other Ambulatory Visit: Payer: Self-pay | Admitting: Family Medicine

## 2021-02-21 ENCOUNTER — Ambulatory Visit
Admission: RE | Admit: 2021-02-21 | Discharge: 2021-02-21 | Disposition: A | Discharge status: Home / Self Care | Payer: Medicare HMO | Source: Ambulatory Visit | Attending: Student | Admitting: Student

## 2021-02-21 DIAGNOSIS — M4316 Spondylolisthesis, lumbar region: Secondary | ICD-10-CM

## 2021-02-21 DIAGNOSIS — M48061 Spinal stenosis, lumbar region without neurogenic claudication: Secondary | ICD-10-CM | POA: Diagnosis not present

## 2021-02-21 IMAGING — CT CT L SPINE W/ CM
2 of 8 series · 4 of 14 positions shown, 5 images · non-contrast
Comparison: Lumbar spine MRI [DATE]

CLINICAL DATA: Lumbar spondylolisthesis. Pain in the low back and
posterolateral legs, right worse than left. Prior lumbar fusion.
TECHNIQUE: Contiguous axial images were obtained through the Lumbar spine after
the intrathecal infusion of contrast. Coronal and sagittal
reconstructions were obtained of the axial image sets.

[Series 5: l spine soft · axial · 0.37mm/px · z∈[-668,-590]mm · 2 of 118 slices shown, 3 images]
[im 40/118  soft-tissue]
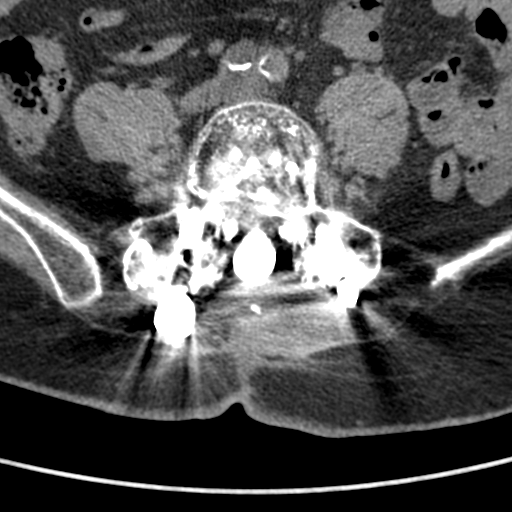
[im 40/118  bone]
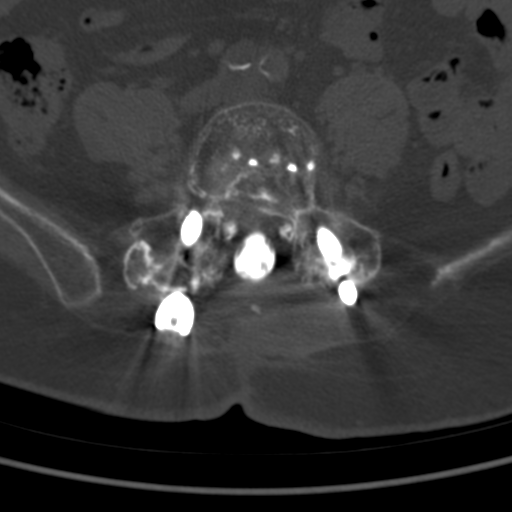
[im 79/118  bone]
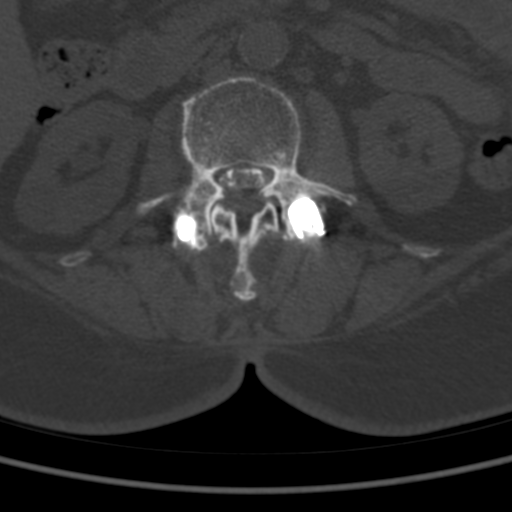

[Series 12: thin soft (person_name) · axial · 0.37mm/px · z∈[-666,-590]mm · 2 of 116 slices shown]
[im 39/116  soft-tissue]
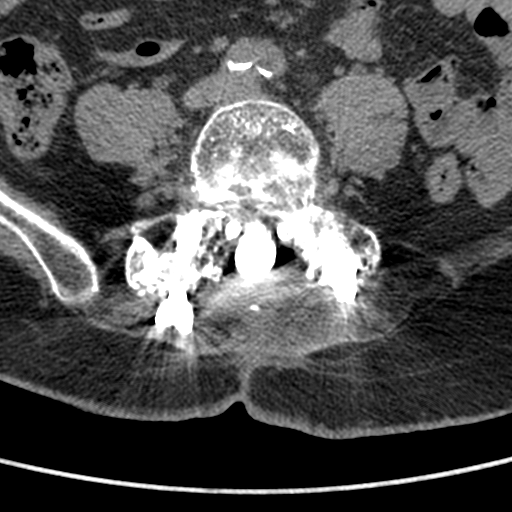
[im 77/116  soft-tissue]
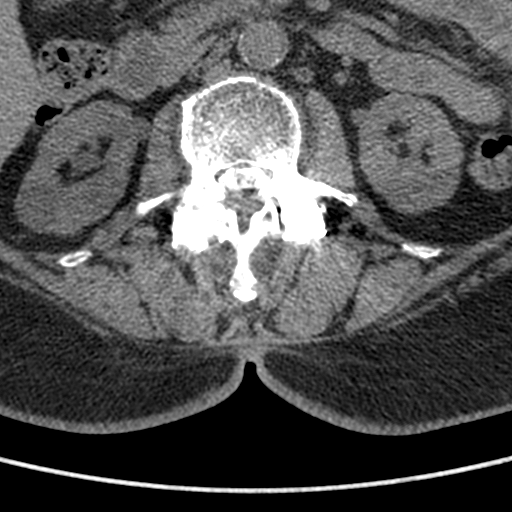

[4 of 14 positions shown; findings below may reference images not displayed]

EXAM:
LUMBAR MYELOGRAM

FLUOROSCOPY:
Fluoroscopy Time: 2 minutes 23 seconds

Radiation Exposure Index: 667.94 microGray*m^2

PROCEDURE:
After thorough discussion of risks and benefits of the procedure
including bleeding, infection, injury to nerves, blood vessels,
adjacent structures as well as headache and CSF leak, written and
oral informed consent was obtained. Consent was obtained by Dr.
HENRIK FALK. Time out form was completed.

Patient was positioned prone on the fluoroscopy table. Local
anesthesia was provided with 1% lidocaine without epinephrine after
prepped and draped in the usual sterile fashion. Puncture was
performed at L4-5 using a 3 1/2 inch 22-gauge spinal needle via a
midline approach. Using a single pass through the dura, the needle
was placed within the thecal sac, with return of clear CSF. 15 mL of
Isovue [YD] was injected into the thecal sac, with normal
opacification of the nerve roots and cauda equina consistent with
free flow within the subarachnoid space.

I personally performed the lumbar puncture and administered the
intrathecal contrast. I also personally supervised acquisition of
the myelogram images.
FINDINGS: LUMBAR MYELOGRAM FINDINGS:

There are 5 non rib-bearing lumbar type vertebrae. Trace
retrolisthesis of T12 on L1 and L1 on L2 does not change with
flexion or extension. There is spinal stenosis at L1-2 greater than
T12-L1 which is similar between prone and upright positioning. L2-S1
fusion is noted with wide patency of the spinal canal at these
levels.

CT LUMBAR MYELOGRAM FINDINGS:

Trace retrolisthesis of T12 on L1 and L1 on L2 is unchanged from the
prior MRI. No acute fracture or suspicious osseous lesion is
identified. The bones are diffusely osteopenic. Prior sacroplasty is
noted. Sequelae of L2-S1 posterior and interbody fusion are again
identified with solid arthrodesis at each level.

The conus medullaris terminates at L1-2. Small hypodensities in the
kidneys likely represent cysts but are incompletely evaluated. There
is abdominal aortic atherosclerosis without aneurysm.

T11-12: Mild facet arthrosis without stenosis.

T12-L1: Asymmetrically advanced left-sided disc degeneration with
severe disc space narrowing, prominent degenerative endplate
changes, and vacuum disc. Retrolisthesis with circumferential
bulging of uncovered disc, disc space height loss, and moderate
facet and ligamentum flavum hypertrophy result in mild-to-moderate
spinal stenosis and moderate right and moderate to severe left
neural foraminal stenosis.

L1-2: Retrolisthesis with bulging uncovered disc, mildly prominent
dorsal epidural fat, and severe facet and ligamentum flavum
hypertrophy result in moderate to severe spinal stenosis and
moderate left greater than right neural foraminal stenosis.

L2-3 through L5-S1. Prior posterior decompression and fusion. No
stenosis.
IMPRESSION: 1. Solid L2-S1 fusion without stenosis.
2. Moderate to severe spinal stenosis and moderate neural foraminal
stenosis at L1-2.
3. Mild-to-moderate spinal stenosis and moderate to severe neural
foraminal stenosis at T12-L1.
4. Aortic Atherosclerosis ([YD]-[YD]).

## 2021-02-21 IMAGING — XA DG MYELOGRAPHY LUMBAR INJ LUMBOSACRAL
13 of 19 series · 13 of 19 positions shown · non-contrast
Comparison: Lumbar spine MRI [DATE]

CLINICAL DATA: Lumbar spondylolisthesis. Pain in the low back and
posterolateral legs, right worse than left. Prior lumbar fusion.
TECHNIQUE: Contiguous axial images were obtained through the Lumbar spine after
the intrathecal infusion of contrast. Coronal and sagittal
reconstructions were obtained of the axial image sets.

[Series 1: ortho adipose · 1 of 1 slices shown (1 of 11)]
[im 1/1]
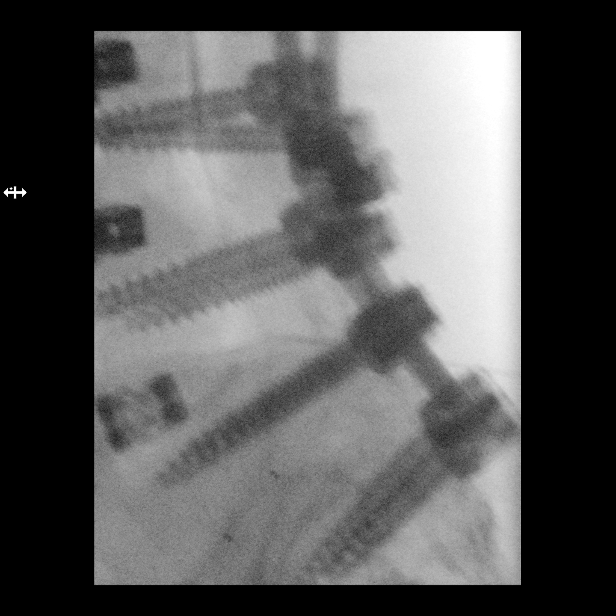

[Series 2: w lumbar spine flexion · 0.15mm/px · 1 of 1 slices shown]
[im 1/1]
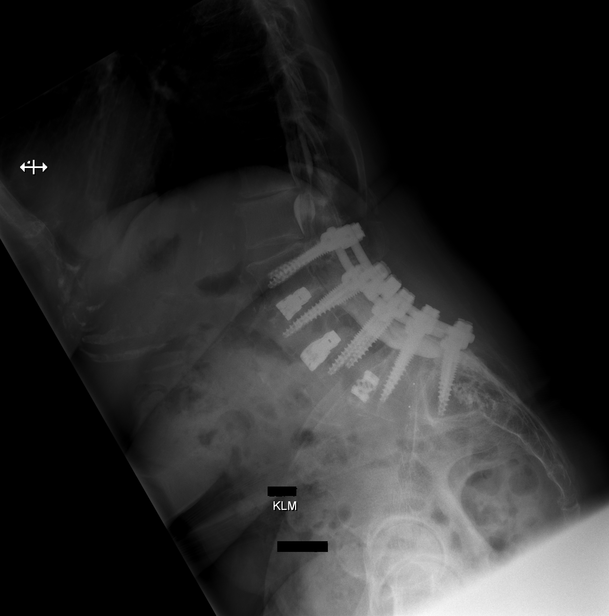

[Series 2: ortho adipose · 1 of 1 slices shown (2 of 11)]
[im 1/1]
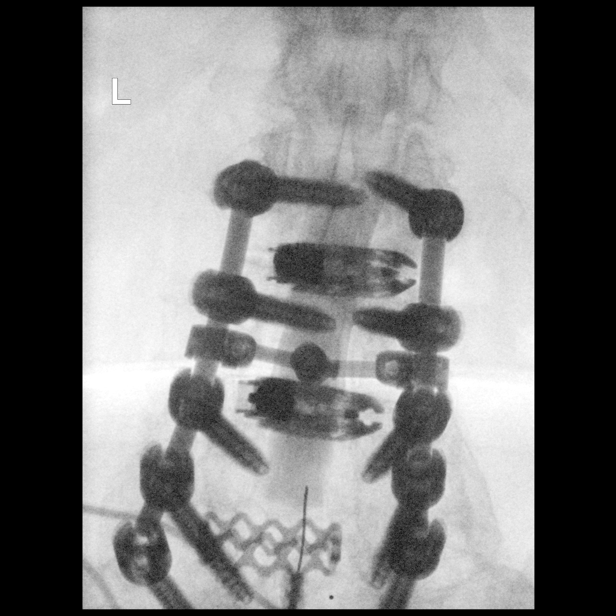

[Series 3: w lumbar spine extension · 0.15mm/px · 1 of 1 slices shown]
[im 1/1]
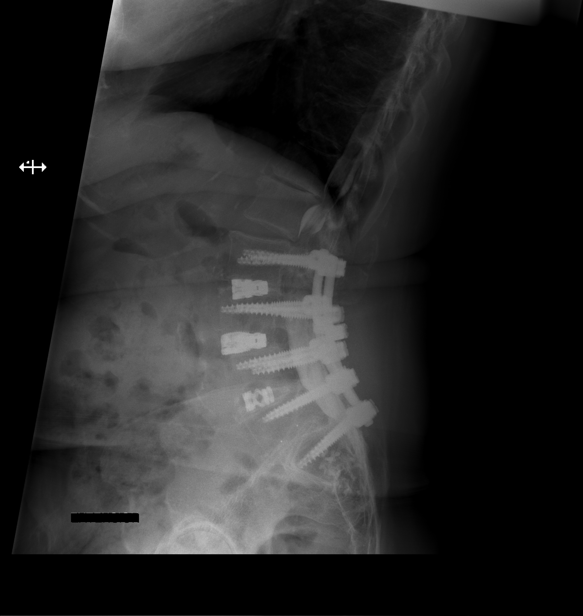

[Series 4: ortho adipose · 1 of 1 slices shown (3 of 11)]
[im 1/1]
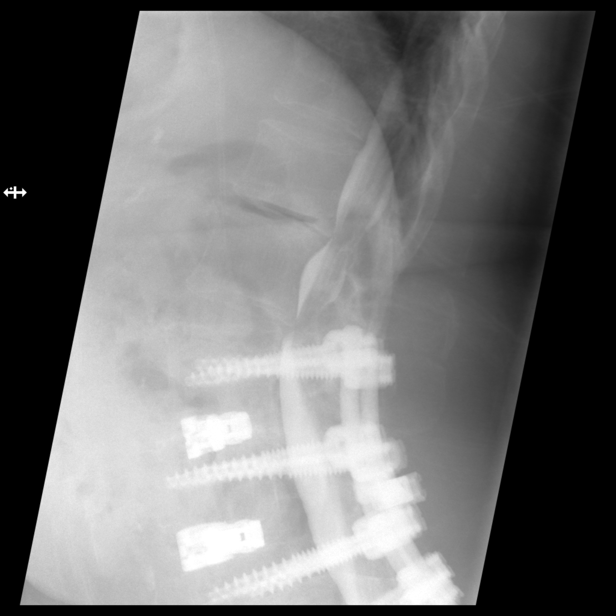

[Series 6: ortho adipose · 1 of 1 slices shown (4 of 11)]
[im 1/1]
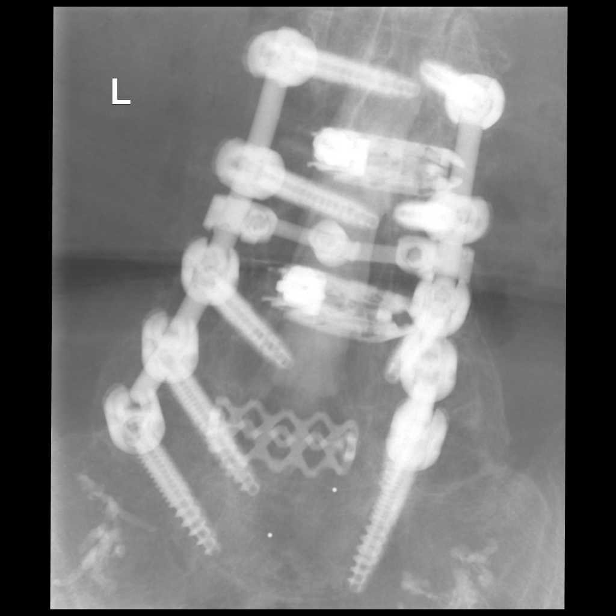

[Series 7: ortho adipose · 1 of 1 slices shown (5 of 11)]
[im 1/1]
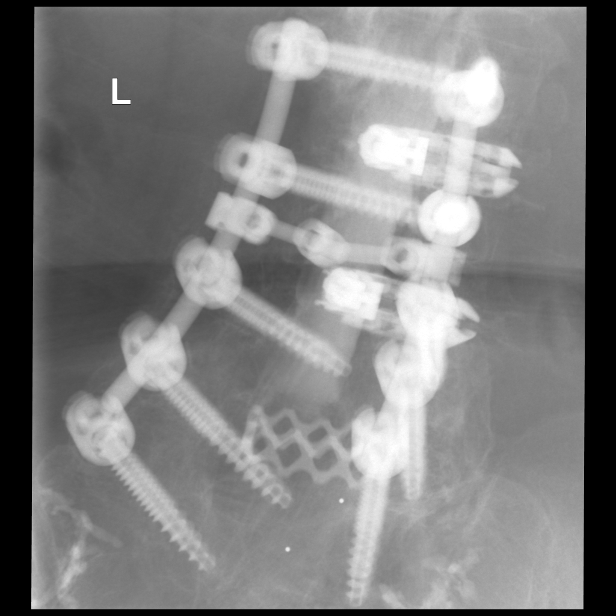

[Series 8: ortho adipose · 1 of 1 slices shown (6 of 11)]
[im 1/1]
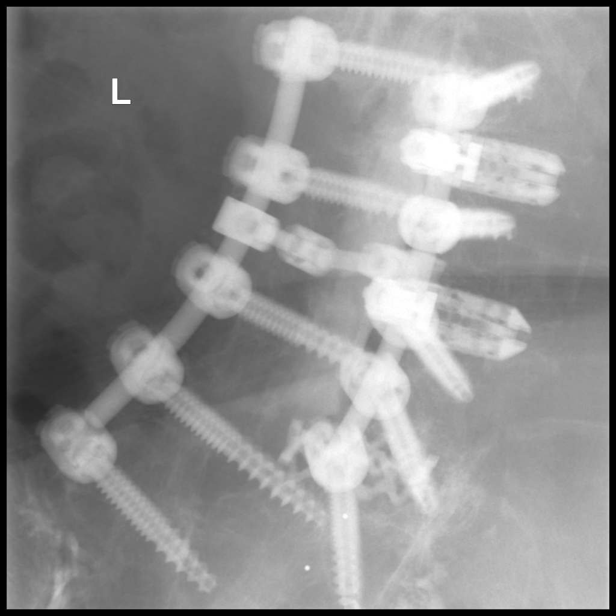

[Series 10: ortho adipose · 1 of 1 slices shown (7 of 11)]
[im 1/1]
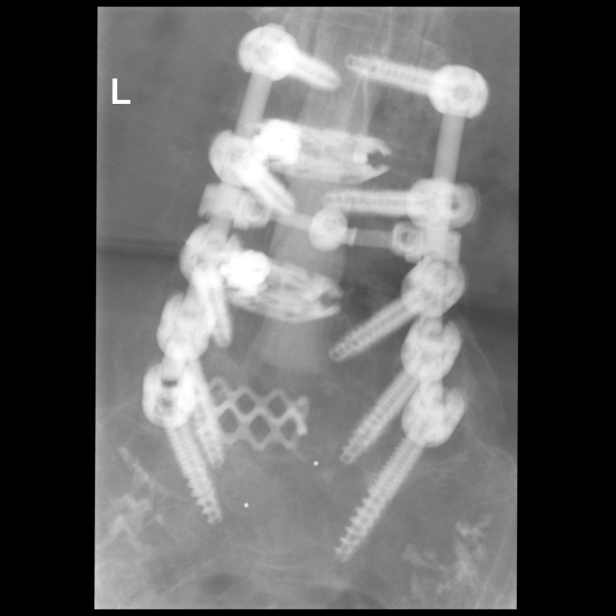

[Series 11: ortho adipose · 1 of 1 slices shown (8 of 11)]
[im 1/1]
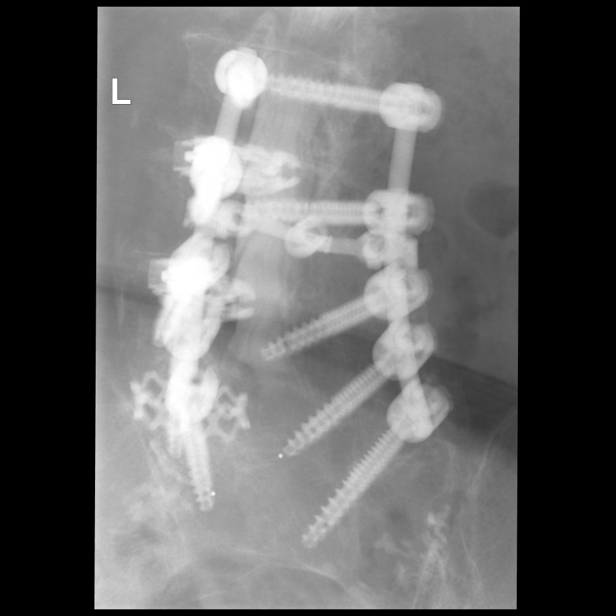

[Series 13: ortho adipose · 1 of 1 slices shown (9 of 11)]
[im 1/1]
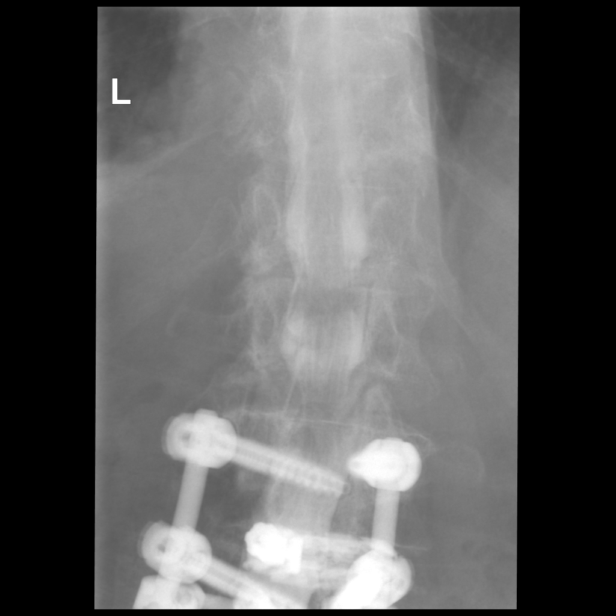

[Series 14: ortho adipose · 1 of 1 slices shown (10 of 11)]
[im 1/1]
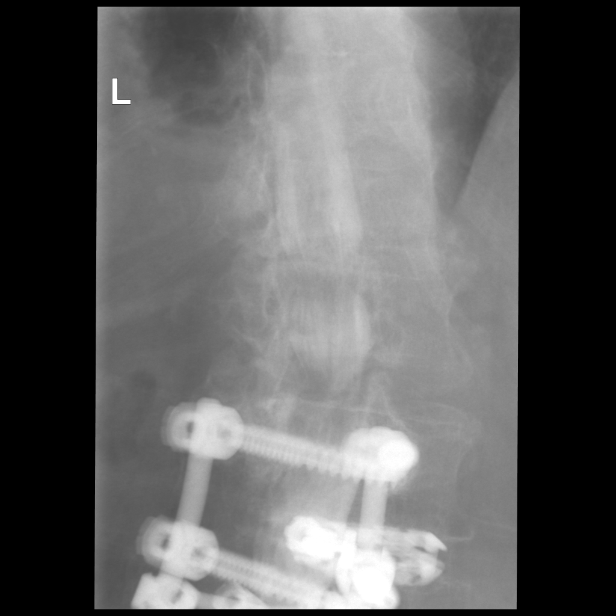

[Series 16: ortho adipose · 1 of 1 slices shown (11 of 11)]
[im 1/1]
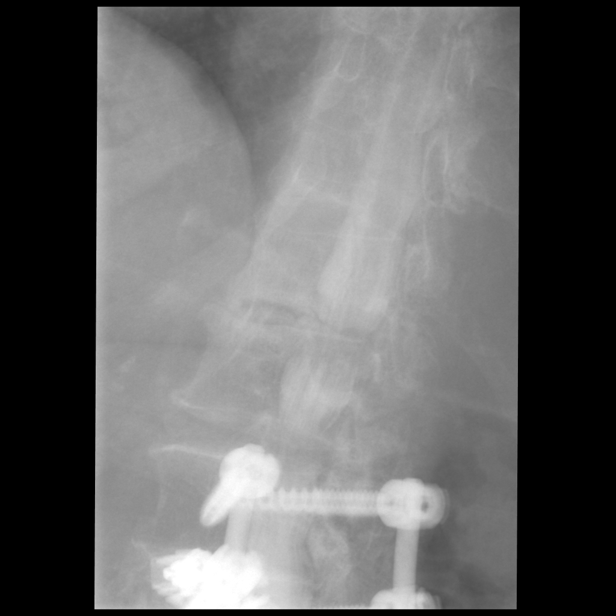

[13 of 19 positions shown; findings below may reference images not displayed]

EXAM:
LUMBAR MYELOGRAM

FLUOROSCOPY:
Fluoroscopy Time: 2 minutes 23 seconds

Radiation Exposure Index: 667.94 microGray*m^2

PROCEDURE:
After thorough discussion of risks and benefits of the procedure
including bleeding, infection, injury to nerves, blood vessels,
adjacent structures as well as headache and CSF leak, written and
oral informed consent was obtained. Consent was obtained by Dr.
HENRIK FALK. Time out form was completed.

Patient was positioned prone on the fluoroscopy table. Local
anesthesia was provided with 1% lidocaine without epinephrine after
prepped and draped in the usual sterile fashion. Puncture was
performed at L4-5 using a 3 1/2 inch 22-gauge spinal needle via a
midline approach. Using a single pass through the dura, the needle
was placed within the thecal sac, with return of clear CSF. 15 mL of
Isovue [YD] was injected into the thecal sac, with normal
opacification of the nerve roots and cauda equina consistent with
free flow within the subarachnoid space.

I personally performed the lumbar puncture and administered the
intrathecal contrast. I also personally supervised acquisition of
the myelogram images.
FINDINGS: LUMBAR MYELOGRAM FINDINGS:

There are 5 non rib-bearing lumbar type vertebrae. Trace
retrolisthesis of T12 on L1 and L1 on L2 does not change with
flexion or extension. There is spinal stenosis at L1-2 greater than
T12-L1 which is similar between prone and upright positioning. L2-S1
fusion is noted with wide patency of the spinal canal at these
levels.

CT LUMBAR MYELOGRAM FINDINGS:

Trace retrolisthesis of T12 on L1 and L1 on L2 is unchanged from the
prior MRI. No acute fracture or suspicious osseous lesion is
identified. The bones are diffusely osteopenic. Prior sacroplasty is
noted. Sequelae of L2-S1 posterior and interbody fusion are again
identified with solid arthrodesis at each level.

The conus medullaris terminates at L1-2. Small hypodensities in the
kidneys likely represent cysts but are incompletely evaluated. There
is abdominal aortic atherosclerosis without aneurysm.

T11-12: Mild facet arthrosis without stenosis.

T12-L1: Asymmetrically advanced left-sided disc degeneration with
severe disc space narrowing, prominent degenerative endplate
changes, and vacuum disc. Retrolisthesis with circumferential
bulging of uncovered disc, disc space height loss, and moderate
facet and ligamentum flavum hypertrophy result in mild-to-moderate
spinal stenosis and moderate right and moderate to severe left
neural foraminal stenosis.

L1-2: Retrolisthesis with bulging uncovered disc, mildly prominent
dorsal epidural fat, and severe facet and ligamentum flavum
hypertrophy result in moderate to severe spinal stenosis and
moderate left greater than right neural foraminal stenosis.

L2-3 through L5-S1. Prior posterior decompression and fusion. No
stenosis.
IMPRESSION: 1. Solid L2-S1 fusion without stenosis.
2. Moderate to severe spinal stenosis and moderate neural foraminal
stenosis at L1-2.
3. Mild-to-moderate spinal stenosis and moderate to severe neural
foraminal stenosis at T12-L1.
4. Aortic Atherosclerosis ([YD]-[YD]).

## 2021-02-21 MED ORDER — IOPAMIDOL (ISOVUE-M 200) INJECTION 41%
18.0000 mL | Freq: Once | INTRAMUSCULAR | Status: AC
  Administered 2021-02-21: 18 mL via INTRATHECAL

## 2021-02-21 MED ORDER — ONDANSETRON HCL 4 MG/2ML IJ SOLN: 4.0000 mg | Freq: Once | INTRAMUSCULAR | Status: DC | PRN

## 2021-02-21 MED ORDER — DIAZEPAM 5 MG PO TABS: 5.0000 mg | ORAL_TABLET | Freq: Once | ORAL | Status: DC

## 2021-02-21 MED ORDER — MEPERIDINE HCL 50 MG/ML IJ SOLN: 50.0000 mg | Freq: Once | INTRAMUSCULAR | Status: DC | PRN

## 2021-02-21 NOTE — Discharge Instructions (Signed)

## 2021-02-21 NOTE — Telephone Encounter (Signed)
Requested medications are due for refill today.  yes  Requested medications are on the active medications list.  yes  Last refill. 10/14/2020  Future visit scheduled.   no  Notes to clinic.  Pt last seen at [facility] 06/03/2020. It appears that pt has transferred to a new PCP. Please advise.    Requested Prescriptions  Pending Prescriptions Disp Refills   meloxicam (MOBIC) 15 MG tablet [Pharmacy Med Name: MELOXICAM TAB 15MG] 90 tablet 0    Sig: TAKE 1 TABLET DAILY     Analgesics:  COX2 Inhibitors Failed - 02/20/2021 11:41 AM      Failed - Manual Review: Labs are only required if the patient has taken medication for more than 8 weeks.      Passed - HGB in normal range and within 360 days    Hemoglobin  Date Value Ref Range Status  05/31/2020 13.8 11.1 - 15.9 g/dL Final          Passed - Cr in normal range and within 360 days    Creat  Date Value Ref Range Status  09/26/2016 1.02 (H) 0.60 - 0.93 mg/dL Final    Comment:    For patients >61 years of age, the reference limit for Creatinine is approximately 13% higher for people identified as African-American. .    Creatinine, Ser  Date Value Ref Range Status  05/31/2020 0.98 0.57 - 1.00 mg/dL Final          Passed - HCT in normal range and within 360 days    Hematocrit  Date Value Ref Range Status  05/31/2020 39.8 34.0 - 46.6 % Final          Passed - AST in normal range and within 360 days    AST  Date Value Ref Range Status  05/31/2020 18 0 - 40 IU/L Final   SGOT(AST)  Date Value Ref Range Status  04/17/2011 26 15 - 37 Unit/L Final          Passed - ALT in normal range and within 360 days    ALT  Date Value Ref Range Status  05/31/2020 20 0 - 32 IU/L Final   SGPT (ALT)  Date Value Ref Range Status  04/17/2011 26 U/L Final    Comment:    12-78 NOTE: NEW REFERENCE RANGE 12/09/2010           Passed - eGFR is 30 or above and within 360 days    EGFR (African American)  Date Value Ref Range Status   03/05/2013 54 (L)  Final   GFR calc Af Amer  Date Value Ref Range Status  10/23/2017 71 >59 mL/min/1.73 Final   EGFR (Non-African Amer.)  Date Value Ref Range Status  03/05/2013 46 (L)  Final    Comment:    eGFR values <36m/min/1.73 m2 may be an indication of chronic kidney disease (CKD). Calculated eGFR is useful in patients with stable renal function. The eGFR calculation will not be reliable in acutely ill patients when serum creatinine is changing rapidly. It is not useful in  patients on dialysis. The eGFR calculation may not be applicable to patients at the low and high extremes of body sizes, pregnant women, and vegetarians.    GFR calc non Af Amer  Date Value Ref Range Status  10/23/2017 61 >59 mL/min/1.73 Final   eGFR  Date Value Ref Range Status  05/31/2020 61 >59 mL/min/1.73 Final          Passed - Patient is  not pregnant      Passed - Valid encounter within last 12 months    Recent Outpatient Visits           1 year ago Essential (primary) hypertension   [facility], Vickki Muff, PA-C   1 year ago Heart murmur   [facility], Vickki Muff, PA-C   3 years ago Pre-operative clearance   [facility], Vickki Muff, PA-C   3 years ago Essential (primary) hypertension   [place], PA-C   3 years ago Muscle weakness   [facility], Vickki Muff, [place]

## 2021-02-25 DIAGNOSIS — M4316 Spondylolisthesis, lumbar region: Secondary | ICD-10-CM | POA: Diagnosis not present

## 2021-02-25 DIAGNOSIS — M5136 Other intervertebral disc degeneration, lumbar region: Secondary | ICD-10-CM | POA: Diagnosis not present

## 2021-02-25 DIAGNOSIS — Z6829 Body mass index (BMI) 29.0-29.9, adult: Secondary | ICD-10-CM | POA: Diagnosis not present

## 2021-02-25 DIAGNOSIS — M48062 Spinal stenosis, lumbar region with neurogenic claudication: Secondary | ICD-10-CM | POA: Diagnosis not present

## 2021-02-25 DIAGNOSIS — I10 Essential (primary) hypertension: Secondary | ICD-10-CM | POA: Diagnosis not present

## 2021-03-07 ENCOUNTER — Other Ambulatory Visit: Payer: Self-pay | Admitting: Neurosurgery

## 2021-03-18 DIAGNOSIS — M4316 Spondylolisthesis, lumbar region: Secondary | ICD-10-CM | POA: Diagnosis not present

## 2021-03-18 DIAGNOSIS — I10 Essential (primary) hypertension: Secondary | ICD-10-CM | POA: Diagnosis not present

## 2021-03-18 DIAGNOSIS — Z96652 Presence of left artificial knee joint: Secondary | ICD-10-CM | POA: Diagnosis not present

## 2021-03-18 DIAGNOSIS — Z0000 Encounter for general adult medical examination without abnormal findings: Secondary | ICD-10-CM | POA: Diagnosis not present

## 2021-03-18 DIAGNOSIS — M961 Postlaminectomy syndrome, not elsewhere classified: Secondary | ICD-10-CM | POA: Diagnosis not present

## 2021-03-21 ENCOUNTER — Other Ambulatory Visit: Payer: Self-pay | Admitting: Neurosurgery

## 2021-04-07 ENCOUNTER — Other Ambulatory Visit (HOSPITAL_COMMUNITY): Payer: Medicare HMO

## 2021-04-11 ENCOUNTER — Inpatient Hospital Stay (HOSPITAL_COMMUNITY): Admission: RE | Admit: 2021-04-11 | Payer: Medicare HMO | Source: Home / Self Care | Admitting: Neurosurgery

## 2021-04-11 ENCOUNTER — Encounter (HOSPITAL_COMMUNITY): Admission: RE | Payer: Self-pay | Source: Home / Self Care

## 2021-04-11 SURGERY — POSTERIOR LUMBAR FUSION 2 LEVEL
Anesthesia: General

## 2021-05-19 ENCOUNTER — Other Ambulatory Visit: Payer: Self-pay | Admitting: Neurosurgery

## 2021-05-27 NOTE — Pre-Procedure Instructions (Signed)
Surgical Instructions ? ? ? Your procedure is scheduled on 06/02/21. ? Report to [facility] Main Entrance "A" at 09:50 A.M., then check in with the Admitting office. ? Call this number if you have problems the morning of surgery: ? [phone] ? ? If you have any questions prior to your surgery date call [phone]: Open Monday-Friday 8am-4pm ? ? ? Remember: ? Do not eat after midnight the night before your surgery ? ?You may drink clear liquids until 08:50 the morning of your surgery.   ?Clear liquids allowed are: Water, Non-Citrus Juices (without pulp), Carbonated Beverages, Clear Tea, Black Coffee ONLY (NO MILK, CREAM OR POWDERED CREAMER of any kind), and Gatorade ?  ? ? Take these medicines the morning of surgery with A SIP OF WATER:  ?amLODipine (NORVASC), carvedilol (COREG ? ?As of today, STOP taking any Aspirin (unless otherwise instructed by your surgeon) Aleve, Naproxen, Ibuprofen, Motrin, Advil, Goody's, BC's, all herbal medications, fish oil, and all vitamins. ? ?         ?Do not wear jewelry or makeup ?Do not wear lotions, powders, perfumes/colognes, or deodorant. ?Do not shave 48 hours prior to surgery.  Men may shave face and neck. ?Do not bring valuables to the hospital. ?Do not wear nail polish, gel polish, artificial nails, or any other type of covering on natural nails (fingers and toes) ?If you have artificial nails or gel coating that need to be removed by a nail salon, please have this removed prior to surgery. Artificial nails or gel coating may interfere with anesthesia's ability to adequately monitor your vital signs. ? ?[facility] is not responsible for any belongings or valuables. .  ? ?Do NOT Smoke (Tobacco/Vaping)  24 hours prior to your procedure ? ?If you use a CPAP at night, you may bring your mask for your overnight stay. ?  ?Contacts, glasses, hearing aids, dentures or partials may not be worn into surgery, please bring cases for these belongings ?  ?For patients admitted to the  hospital, discharge time will be determined by your treatment team. ?  ?Patients discharged the day of surgery will not be allowed to drive home, and someone needs to stay with them for 24 hours. ? ? ?SURGICAL WAITING ROOM VISITATION ?Patients having surgery or a procedure in a hospital may have two support people. ?Children under the age of 33 must have an adult with them who is not the patient. ?They may stay in the waiting area during the procedure and may switch out with other visitors. If the patient needs to stay at the hospital during part of their recovery, the visitor guidelines for inpatient rooms apply. ? ?Please refer to the [facility] website for the visitor guidelines for Inpatients (after your surgery is over and you are in a regular room).  ? ? ?Special instructions:   ? ?Oral Hygiene is also important to reduce your risk of infection.  Remember - BRUSH YOUR TEETH THE MORNING OF SURGERY WITH YOUR REGULAR TOOTHPASTE ? ? ?[place] [facility]- Preparing For Surgery ? ?Before surgery, you can play an important role. Because skin is not sterile, your skin needs to be as free of germs as possible. You can reduce the number of germs on your skin by washing with CHG (chlorahexidine gluconate) Soap before surgery.  CHG is an antiseptic cleaner which kills germs and bonds with the skin to continue killing germs even after washing.   ? ? ?Please do not use if you have an allergy to CHG  or antibacterial soaps. If your skin becomes reddened/irritated stop using the CHG.  ?Do not shave (including legs and underarms) for at least 48 hours prior to first CHG shower. It is OK to shave your face. ? ?Please follow these instructions carefully. ?  ? ? Shower the NIGHT BEFORE SURGERY and the MORNING OF SURGERY with CHG Soap.  ? If you chose to wash your hair, wash your hair first as usual with your normal shampoo. After you shampoo, rinse your hair and body thoroughly to remove the shampoo.  Then [employer] and genitals (private  parts) with your normal soap and rinse thoroughly to remove soap. ? ?After that Use CHG Soap as you would any other liquid soap. You can apply CHG directly to the skin and wash gently with a scrungie or a clean washcloth.  ? ?Apply the CHG Soap to your body ONLY FROM THE NECK DOWN.  Do not use on open wounds or open sores. Avoid contact with your eyes, ears, mouth and genitals (private parts). Wash Face and genitals (private parts)  with your normal soap.  ? ?Wash thoroughly, paying special attention to the area where your surgery will be performed. ? ?Thoroughly rinse your body with warm water from the neck down. ? ?DO NOT shower/wash with your normal soap after using and rinsing off the CHG Soap. ? ?Pat yourself dry with a CLEAN TOWEL. ? ?Wear CLEAN PAJAMAS to bed the night before surgery ? ?Place CLEAN SHEETS on your bed the night before your surgery ? ?DO NOT SLEEP WITH PETS. ? ? ?Day of Surgery: ? ?Take a shower with CHG soap. ?Wear Clean/Comfortable clothing the morning of surgery ?Do not apply any deodorants/lotions.   ?Remember to brush your teeth WITH YOUR REGULAR TOOTHPASTE. ? ? ? ?If you received a COVID test during your pre-op visit, it is requested that you wear a mask when out in public, stay away from anyone that may not be feeling well, and notify your surgeon if you develop symptoms. If you have been in contact with anyone that has tested positive in the last 10 days, please notify your surgeon. ? ?  ?Please read over the following fact sheets that you were given.  ? ?

## 2021-05-30 ENCOUNTER — Other Ambulatory Visit: Payer: Self-pay

## 2021-05-30 ENCOUNTER — Encounter (HOSPITAL_COMMUNITY)
Admission: RE | Admit: 2021-05-30 | Discharge: 2021-05-30 | Disposition: A | Discharge status: Home / Self Care | Payer: Medicare HMO | Source: Ambulatory Visit | Attending: Neurosurgery | Admitting: Neurosurgery

## 2021-05-30 ENCOUNTER — Other Ambulatory Visit: Payer: Self-pay | Admitting: Neurosurgery

## 2021-05-30 ENCOUNTER — Encounter (HOSPITAL_COMMUNITY): Payer: Self-pay

## 2021-05-30 VITALS — BP 158/74 | HR 69 | Temp 98.1°F | Resp 18 | Ht 63.0 in | Wt 167.5 lb

## 2021-05-30 DIAGNOSIS — M4310 Spondylolisthesis, site unspecified: Secondary | ICD-10-CM | POA: Insufficient documentation

## 2021-05-30 DIAGNOSIS — I10 Essential (primary) hypertension: Secondary | ICD-10-CM | POA: Insufficient documentation

## 2021-05-30 DIAGNOSIS — E785 Hyperlipidemia, unspecified: Secondary | ICD-10-CM | POA: Insufficient documentation

## 2021-05-30 DIAGNOSIS — R011 Cardiac murmur, unspecified: Secondary | ICD-10-CM | POA: Insufficient documentation

## 2021-05-30 DIAGNOSIS — Z85828 Personal history of other malignant neoplasm of skin: Secondary | ICD-10-CM | POA: Insufficient documentation

## 2021-05-30 DIAGNOSIS — Z01818 Encounter for other preprocedural examination: Secondary | ICD-10-CM

## 2021-05-30 DIAGNOSIS — Z01812 Encounter for preprocedural laboratory examination: Secondary | ICD-10-CM | POA: Insufficient documentation

## 2021-05-30 DIAGNOSIS — M1990 Unspecified osteoarthritis, unspecified site: Secondary | ICD-10-CM | POA: Insufficient documentation

## 2021-05-30 DIAGNOSIS — Z87891 Personal history of nicotine dependence: Secondary | ICD-10-CM | POA: Insufficient documentation

## 2021-05-30 HISTORY — DX: Systemic involvement of connective tissue, unspecified: M35.9

## 2021-05-30 HISTORY — DX: Malignant (primary) neoplasm, unspecified: C80.1

## 2021-05-30 LAB — CBC
HCT: 40.6 % (ref 36.0–46.0)
Hemoglobin: 13.4 g/dL (ref 12.0–15.0)
MCH: 31.5 pg (ref 26.0–34.0)
MCHC: 33 g/dL (ref 30.0–36.0)
MCV: 95.5 fL (ref 80.0–100.0)
Platelets: 266 10*3/uL (ref 150–400)
RBC: 4.25 MIL/uL (ref 3.87–5.11)
RDW: 14.3 % (ref 11.5–15.5)
WBC: 8.2 10*3/uL (ref 4.0–10.5)
nRBC: 0 % (ref 0.0–0.2)

## 2021-05-30 LAB — BASIC METABOLIC PANEL
Anion gap: 11 (ref 5–15)
BUN: 26 mg/dL — ABNORMAL HIGH (ref 8–23)
CO2: 25 mmol/L (ref 22–32)
Calcium: 9.2 mg/dL (ref 8.9–10.3)
Chloride: 101 mmol/L (ref 98–111)
Creatinine, Ser: 0.97 mg/dL (ref 0.44–1.00)
GFR, Estimated: >60 mL/min (ref >60)
Glucose, Bld: 95 mg/dL (ref 70–99)
Potassium: 4.1 mmol/L (ref 3.5–5.1)
Sodium: 137 mmol/L (ref 135–145)

## 2021-05-30 LAB — TYPE AND SCREEN
ABO/RH(D): A POS
Antibody Screen: NEGATIVE

## 2021-05-30 LAB — SURGICAL PCR SCREEN

## 2021-05-30 NOTE — Progress Notes (Signed)
PCP - Gladstone Lighter, MD ?Cardiologist - Serafina Royals, MD- pt reports that she does not think that her cardiologist is aware of her upcoming surgery. Pt denies any cardiac symptoms or recent cardiac issues. Pt states she will send a mychart message to cardiologist.  ? ?PPM/ICD - denies ?Chest x-ray - N/A ?EKG -05/30/2021   ?Stress Test - >10 years ago per patient ?ECHO - 02/21/20 ?Cardiac Cath -denies  ? ?Sleep Study - denies ? ?Blood Thinner Instructions: pt not on any blood thinners ?ERAS Protcol -no orders per surgeon, ERAS per protocol ? ?COVID TEST- N/A ? ?Anesthesia review: Pt with heart murmur, pt repots she has not notified her cardiologist that she is having surgery. Does patient need cardiac clearance? Jeneen Rinks and Ebony Hail notified via staff message of pt hx of heart murmur during PAT appointment.  ? ?Patient denies shortness of breath, fever, cough and chest pain at PAT appointment ? ? ?All instructions explained to the patient, with a verbal understanding of the material. Patient agrees to go over the instructions while at home for a better understanding. The opportunity to ask questions was provided. ? ? ?

## 2021-05-31 NOTE — Progress Notes (Addendum)
Anesthesia Chart Review: ? Case: [id number] Date/Time: 06/02/21 1135  ? Procedure: PLIF, IP, T12-L1, L1-L2; EXTEND INSTRUMENTATION AND FUSION TO T10  ? Anesthesia type: General  ? Pre-op diagnosis: LUMBAR ADJACENT SEGMENT DISEASE WITH SPONDYLOLISTHESIS  ? Location: [facility] ROOM 21 / [facility]  ? Surgeons: Newman Pies, MD  ? ?  ? ? ?DISCUSSION: Patient is a 76 year old female scheduled for the above procedure. ? ?History includes former smoker (04/17/79), HTN, HLD, murmur (mild MR/TR 02/2020 echo), connective tissue disease (no issues in > 10 years, skin cancer (BCC excision), spinal surgery (lumbar fusion 07/26/19 in [place], C5-6 ACDF 2004 in [place]; C4-5 ACD and removal of plate at V3-7 10/62/69; L2-4 transforaminal interbody fusion & L2-S1 posterolateral fusion 12/18/16; S1 sacroplasty 03/29/17), arthritis (left TKA 03/03/13, revision 07/31/16), hysterectomy (for ruptured ectopic pregnancy 1981). No carotid artery imaging noted and not mentioned in latest cardiology notes--notes indicate her mother had a history of carotid endarterectomy. ? ?Last visit with cardiologist Dr. Nehemiah Massed was on 10/27/20. She had an echo in 02/2020 that showed mild MR/TR, EF EF 45% with subtle distal anterior and apical hypokinesis. He wrote, "She does have some mild LV systolic dysfunction by previous echocardiogram not concerning at this time and not having significant symptoms. The patient has had a previous hypercontractility of anterior wall and an echocardiogram which is nonsignificant. She has mild to moderate mitral regurgitation with no significant consequences at this time". She was tolerating BP medications and was without PND, orthopnea, syncope, dizziness, nausea, chest pain, shortness of breath, diaphoresis.  1 year follow-up recommended. ? ?Patient stable per last cardiology visit but given previous abnormal echocardiogram findings would advise preoperative cardiology input. Nikki at Dr. Adline Mango office aware.  ? ?ADDENDUM 06/01/21  10:01 AM:  ?Reviewed echo findings with anesthesiologist Oren Bracket, MD on 05/31/21 and would defer additional preoperative recommendations, if any, to cardiologist. Received a cardiology preoperative risk assessment signed on 05/31/21 classifying her as "Low Risk" for planned surgery.  Anesthesia team to evaluate on the day of surgery. ? ? ?VS: BP (!) 158/74   Pulse 69   Temp 36.7 ?C (Oral)   Resp 18   Ht '5\' 3"'$  (1.6 m)   Wt 76 kg   SpO2 100%   BMI 29.67 kg/m?  ? ? ?PROVIDERS: ?- Gladstone Lighter, MD is PCP [facility], see Care Everywhere) ?Serafina Royals, MD is cardiologist [facility]) ? ? ?LABS: Labs reviewed: Acceptable for surgery. ?(all labs ordered are listed, but only abnormal results are displayed) ? ?Labs Reviewed  ?SURGICAL PCR SCREEN - Abnormal; Notable for the following components:  ?    Result Value  ? MRSA, PCR   (*)   ? Value: INVALID, UNABLE TO DETERMINE THE PRESENCE OF TARGET DUE TO SPECIMEN INTEGRITY. RECOLLECTION REQUESTED.  ? Staphylococcus aureus   (*)   ? Value: INVALID, UNABLE TO DETERMINE THE PRESENCE OF TARGET DUE TO SPECIMEN INTEGRITY. RECOLLECTION REQUESTED.  ? All other components within normal limits  ?BASIC METABOLIC PANEL - Abnormal; Notable for the following components:  ? BUN 26 (*)   ? All other components within normal limits  ?CBC  ?TYPE AND SCREEN  ? ? ? ?IMAGES: ?CT L-spine 02/21/21:  ?IMPRESSION: ?1. Solid L2-S1 fusion without stenosis. ?2. Moderate to severe spinal stenosis and moderate neural foraminal ?stenosis at L1-2. ?3. Mild-to-moderate spinal stenosis and moderate to severe neural ?foraminal stenosis at T12-L1. ?4. Aortic Atherosclerosis (ICD10-I70.0). ?  ?MRI L-spine 01/27/21: ?IMPRESSION: ?1. Prior L2-S1 PLIF with  improved adjacent segment disease at L1-L2. ?Now mild to moderate spinal canal and right lateral recess stenosis ?has improved. Unchanged moderate bilateral neuroforaminal stenosis. ?2. Progressive asymmetric  left-sided degenerative disc disease at ?T12-L1 with worsening now moderate bilateral neuroforaminal ?stenosis. Unchanged mild spinal canal stenosis. ?  ? ?EKG: 05/30/21: NSR ? ? ?CV: ?Echo 03/12/20 ([facility]): ?INTERPRETATION  ?MILD LV SYSTOLIC DYSFUNCTION (See above)  ?NORMAL RIGHT VENTRICULAR SYSTOLIC FUNCTION  ?MILD VALVULAR REGURGITATION (See above)  ?NO VALVULAR STENOSIS  ?MILD MR, TR  ?EF 45% with SUBTLE DISTAL ANTERIOR AND APICAL HYPOKINESIS  ? ?She reported prior stress test > 10 years ago.  ? ? ?Past Medical History:  ?Diagnosis Date  ? Arthritis   ? Cancer [facility])   ? skin cancer removed from nose and forehead  ? Connective tissue disease ([place])   ? pt reports being diagnosed with undifferentiated connective tissue disease at some point- pt took steroids for this and has had no issues since- 10 years ago  ? Heart murmur   ? as a child some dr say they hear it some say they don't  ? HLD (hyperlipidemia)   ? Hypertension   ? Neuromuscular disorder ([place])   ? weakness in legs when getting up  ? Stenosis of artery in neck ([place]) 09/07/2017  ? ? ?Past Surgical History:  ?Procedure Laterality Date  ? ABDOMINAL HYSTERECTOMY  1981  ? AIKEN OSTEOTOMY Right 01/30/2018  ? Procedure: WEIL RIGHT 2ND AND 3RD;  Surgeon: [name], DPM;  Location: [place];  Service: Podiatry;  Laterality: Right;  ? BACK SURGERY    ? pt reports hx of 3 lumber back surgeries total  ? CERVICAL SPINE SURGERY    ? pt reports having 3 cervical fusions total  ? CORRECTION HAMMER TOE    ? HAMMER TOE SURGERY Right 01/30/2018  ? Procedure: HAMMER TOE CORRECTION SECOND AND THIRD;  Surgeon: [name], DPM;  Location: [place];  Service: Podiatry;  Laterality: Right;  GENERAL WITH LOCAL  ? HAMMER TOE SURGERY Left 03/05/2019  ? Procedure: HAMMER TOE CORRECTION T1 AND T2;  Surgeon: [name], DPM;  Location: [place];  Service: Podiatry;  Laterality: Left;  ? JOINT REPLACEMENT Left 03/03/2013  ? knee  ? MRI    ?  ROTATOR CUFF REPAIR Right 1981  ? SPINAL FUSION  2001  ? lower back   ? SPINAL FUSION  2003  ? neck  ? TOTAL KNEE REVISION Left 07/31/2016  ? Procedure: TOTAL KNEE REVISION;  Surgeon: Dereck Leep, MD;  Location: [facility];  Service: Orthopedics;  Laterality: Left;  ? WEIL OSTEOTOMY Left 03/05/2019  ? Procedure: WEIL OSTEOTOMY X 2 LEFT;  Surgeon: [name], DPM;  Location: [place];  Service: Podiatry;  Laterality: Left;  ? ? ?MEDICATIONS: ? amLODipine (NORVASC) 2.5 MG tablet  ? amoxicillin (AMOXIL) 500 MG capsule  ? carvedilol (COREG) 3.125 MG tablet  ? LORazepam (ATIVAN) 0.5 MG tablet  ? meloxicam (MOBIC) 15 MG tablet  ? nystatin-triamcinolone ointment (MYCOLOG)  ? pravastatin (PRAVACHOL) 40 MG tablet  ? telmisartan (MICARDIS) 80 MG tablet  ? ?No current facility-administered medications for this encounter.  ? ?Not currently taking Ativan.  ? ? ?Myra Gianotti, PA-C ?Surgical Short Stay/Anesthesiology ?[facility] Phone [phone] ?[facility] Phone [phone] ?05/31/2021 1:45 PM ? ? ? ? ? ? ? ?

## 2021-06-01 ENCOUNTER — Other Ambulatory Visit: Payer: Self-pay | Admitting: Neurosurgery

## 2021-06-01 NOTE — Anesthesia Preprocedure Evaluation (Addendum)
Anesthesia Evaluation  Patient identified by MRN, date of birth, ID band Patient awake    Reviewed: Allergy & Precautions, H&P , NPO status , Patient's Chart, lab work & pertinent test results, reviewed documented beta blocker date and time   Airway Mallampati: II  TM Distance: >3 FB Neck ROM: Full    Dental no notable dental hx. (+) Teeth Intact, Dental Advisory Given   Pulmonary asthma , former smoker,    Pulmonary exam normal breath sounds clear to auscultation       Cardiovascular hypertension, Pt. on medications and Pt. on home beta blockers  Rhythm:Regular Rate:Normal     Neuro/Psych negative neurological ROS  negative psych ROS   GI/Hepatic negative GI ROS, Neg liver ROS,   Endo/Other  negative endocrine ROS  Renal/GU negative Renal ROS  negative genitourinary   Musculoskeletal  (+) Arthritis , Osteoarthritis,    Abdominal   Peds  Hematology negative hematology ROS (+)   Anesthesia Other Findings   Reproductive/Obstetrics negative OB ROS                           Anesthesia Physical Anesthesia Plan  ASA: 2  Anesthesia Plan: General   Post-op Pain Management: Tylenol PO (pre-op)*   Induction: Intravenous  PONV Risk Score and Plan: 4 or greater and Ondansetron and Dexamethasone  Airway Management Planned: Oral ETT  Additional Equipment: Arterial line  Intra-op Plan:   Post-operative Plan: Extubation in OR  Informed Consent: I have reviewed the patients History and Physical, chart, labs and discussed the procedure including the risks, benefits and alternatives for the proposed anesthesia with the patient or authorized representative who has indicated his/her understanding and acceptance.     Dental advisory given  Plan Discussed with: CRNA  Anesthesia Plan Comments: (PAT note written by Myra Gianotti, PA-C. )      Anesthesia Quick Evaluation

## 2021-06-02 ENCOUNTER — Encounter (HOSPITAL_COMMUNITY): Payer: Self-pay | Admitting: Neurosurgery

## 2021-06-02 ENCOUNTER — Other Ambulatory Visit: Payer: Self-pay

## 2021-06-02 ENCOUNTER — Inpatient Hospital Stay (HOSPITAL_COMMUNITY)
Admission: RE | Admit: 2021-06-02 | Discharge: 2021-06-03 | DRG: 455 | Disposition: A | Discharge status: Home / Self Care | Payer: Medicare HMO | Attending: Neurosurgery | Admitting: Neurosurgery

## 2021-06-02 ENCOUNTER — Inpatient Hospital Stay (HOSPITAL_COMMUNITY): Payer: Medicare HMO | Admitting: Physician Assistant

## 2021-06-02 ENCOUNTER — Inpatient Hospital Stay (HOSPITAL_COMMUNITY)
Admission: RE | Disposition: A | Discharge status: Home / Self Care | Payer: Self-pay | Source: Home / Self Care | Attending: Neurosurgery

## 2021-06-02 ENCOUNTER — Inpatient Hospital Stay (HOSPITAL_COMMUNITY): Payer: Medicare HMO

## 2021-06-02 DIAGNOSIS — Z79899 Other long term (current) drug therapy: Secondary | ICD-10-CM

## 2021-06-02 DIAGNOSIS — I340 Nonrheumatic mitral (valve) insufficiency: Secondary | ICD-10-CM | POA: Diagnosis present

## 2021-06-02 DIAGNOSIS — Z885 Allergy status to narcotic agent status: Secondary | ICD-10-CM | POA: Diagnosis not present

## 2021-06-02 DIAGNOSIS — Z85828 Personal history of other malignant neoplasm of skin: Secondary | ICD-10-CM

## 2021-06-02 DIAGNOSIS — Z809 Family history of malignant neoplasm, unspecified: Secondary | ICD-10-CM

## 2021-06-02 DIAGNOSIS — M549 Dorsalgia, unspecified: Secondary | ICD-10-CM | POA: Diagnosis present

## 2021-06-02 DIAGNOSIS — E785 Hyperlipidemia, unspecified: Secondary | ICD-10-CM | POA: Diagnosis present

## 2021-06-02 DIAGNOSIS — G9520 Unspecified cord compression: Secondary | ICD-10-CM

## 2021-06-02 DIAGNOSIS — Z833 Family history of diabetes mellitus: Secondary | ICD-10-CM

## 2021-06-02 DIAGNOSIS — Z881 Allergy status to other antibiotic agents status: Secondary | ICD-10-CM

## 2021-06-02 DIAGNOSIS — Z96652 Presence of left artificial knee joint: Secondary | ICD-10-CM | POA: Diagnosis present

## 2021-06-02 DIAGNOSIS — M4316 Spondylolisthesis, lumbar region: Secondary | ICD-10-CM | POA: Diagnosis present

## 2021-06-02 DIAGNOSIS — M4804 Spinal stenosis, thoracic region: Secondary | ICD-10-CM | POA: Diagnosis present

## 2021-06-02 DIAGNOSIS — M5116 Intervertebral disc disorders with radiculopathy, lumbar region: Secondary | ICD-10-CM | POA: Diagnosis present

## 2021-06-02 DIAGNOSIS — I10 Essential (primary) hypertension: Secondary | ICD-10-CM | POA: Diagnosis present

## 2021-06-02 DIAGNOSIS — Z888 Allergy status to other drugs, medicaments and biological substances status: Secondary | ICD-10-CM

## 2021-06-02 DIAGNOSIS — Z87891 Personal history of nicotine dependence: Secondary | ICD-10-CM | POA: Diagnosis not present

## 2021-06-02 DIAGNOSIS — Z887 Allergy status to serum and vaccine status: Secondary | ICD-10-CM | POA: Diagnosis not present

## 2021-06-02 DIAGNOSIS — M1990 Unspecified osteoarthritis, unspecified site: Secondary | ICD-10-CM | POA: Diagnosis present

## 2021-06-02 DIAGNOSIS — Z791 Long term (current) use of non-steroidal anti-inflammatories (NSAID): Secondary | ICD-10-CM | POA: Diagnosis not present

## 2021-06-02 DIAGNOSIS — M48062 Spinal stenosis, lumbar region with neurogenic claudication: Secondary | ICD-10-CM

## 2021-06-02 DIAGNOSIS — M51369 Other intervertebral disc degeneration, lumbar region without mention of lumbar back pain or lower extremity pain: Secondary | ICD-10-CM

## 2021-06-02 DIAGNOSIS — Z01818 Encounter for other preprocedural examination: Principal | ICD-10-CM

## 2021-06-02 DIAGNOSIS — Z981 Arthrodesis status: Secondary | ICD-10-CM | POA: Diagnosis not present

## 2021-06-02 DIAGNOSIS — Z8249 Family history of ischemic heart disease and other diseases of the circulatory system: Secondary | ICD-10-CM

## 2021-06-02 LAB — SURGICAL PCR SCREEN
MRSA, PCR: NEGATIVE
Staphylococcus aureus: NEGATIVE

## 2021-06-02 SURGERY — POSTERIOR LUMBAR FUSION 2 LEVEL
Anesthesia: General | Site: Spine Lumbar

## 2021-06-02 MED ORDER — CHLORHEXIDINE GLUCONATE 0.12 % MT SOLN
15.0000 mL | Freq: Once | OROMUCOSAL | Status: AC
  Administered 2021-06-02: 15 mL via OROMUCOSAL
  Filled 2021-06-02: qty 15

## 2021-06-02 MED ORDER — FENTANYL CITRATE (PF) 250 MCG/5ML IJ SOLN
INTRAMUSCULAR | Status: AC
  Filled 2021-06-02: qty 5

## 2021-06-02 MED ORDER — PHENYLEPHRINE 80 MCG/ML (10ML) SYRINGE FOR IV PUSH (FOR BLOOD PRESSURE SUPPORT)
PREFILLED_SYRINGE | INTRAVENOUS | Status: AC
  Filled 2021-06-02: qty 10

## 2021-06-02 MED ORDER — BUPIVACAINE LIPOSOME 1.3 % IJ SUSP
INTRAMUSCULAR | Status: DC | PRN
  Administered 2021-06-02: 20 mL

## 2021-06-02 MED ORDER — PHENYLEPHRINE HCL-NACL 20-0.9 MG/250ML-% IV SOLN
INTRAVENOUS | Status: DC | PRN
  Administered 2021-06-02: 50 ug/min via INTRAVENOUS

## 2021-06-02 MED ORDER — HYDROMORPHONE HCL 1 MG/ML IJ SOLN
INTRAMUSCULAR | Status: AC
  Filled 2021-06-02: qty 1

## 2021-06-02 MED ORDER — PRAVASTATIN SODIUM 40 MG PO TABS
40.0000 mg | ORAL_TABLET | Freq: Every day | ORAL | Status: DC
  Administered 2021-06-02: 40 mg via ORAL
  Filled 2021-06-02: qty 1

## 2021-06-02 MED ORDER — 0.9 % SODIUM CHLORIDE (POUR BTL) OPTIME
TOPICAL | Status: DC | PRN
  Administered 2021-06-02: 1000 mL

## 2021-06-02 MED ORDER — BUPIVACAINE LIPOSOME 1.3 % IJ SUSP
INTRAMUSCULAR | Status: AC
  Filled 2021-06-02: qty 20

## 2021-06-02 MED ORDER — DEXAMETHASONE SODIUM PHOSPHATE 10 MG/ML IJ SOLN
INTRAMUSCULAR | Status: DC | PRN
  Administered 2021-06-02: 10 mg via INTRAVENOUS

## 2021-06-02 MED ORDER — OXYCODONE HCL 5 MG PO TABS: 5.0000 mg | ORAL_TABLET | ORAL | Status: DC | PRN

## 2021-06-02 MED ORDER — PHENOL 1.4 % MT LIQD: 1.0000 | OROMUCOSAL | Status: DC | PRN

## 2021-06-02 MED ORDER — DOCUSATE SODIUM 100 MG PO CAPS
100.0000 mg | ORAL_CAPSULE | Freq: Two times a day (BID) | ORAL | Status: DC
  Administered 2021-06-02 – 2021-06-03 (×2): 100 mg via ORAL
  Filled 2021-06-02 (×2): qty 1

## 2021-06-02 MED ORDER — BUPIVACAINE-EPINEPHRINE (PF) 0.5% -1:200000 IJ SOLN
INTRAMUSCULAR | Status: DC | PRN
  Administered 2021-06-02: 10 mL

## 2021-06-02 MED ORDER — FENTANYL CITRATE (PF) 250 MCG/5ML IJ SOLN
INTRAMUSCULAR | Status: DC | PRN
  Administered 2021-06-02: 50 ug via INTRAVENOUS
  Administered 2021-06-02 (×2): 25 ug via INTRAVENOUS

## 2021-06-02 MED ORDER — SUFENTANIL CITRATE 50 MCG/ML IV SOLN
INTRAVENOUS | Status: AC
  Filled 2021-06-02: qty 1

## 2021-06-02 MED ORDER — SODIUM CHLORIDE 0.9% FLUSH: 3.0000 mL | Freq: Two times a day (BID) | INTRAVENOUS | Status: DC

## 2021-06-02 MED ORDER — MORPHINE SULFATE (PF) 4 MG/ML IV SOLN: 4.0000 mg | INTRAVENOUS | Status: DC | PRN

## 2021-06-02 MED ORDER — AMLODIPINE BESYLATE 5 MG PO TABS
2.5000 mg | ORAL_TABLET | Freq: Two times a day (BID) | ORAL | Status: DC
  Administered 2021-06-02 – 2021-06-03 (×2): 2.5 mg via ORAL
  Filled 2021-06-02 (×2): qty 1

## 2021-06-02 MED ORDER — ONDANSETRON HCL 4 MG PO TABS: 4.0000 mg | ORAL_TABLET | Freq: Four times a day (QID) | ORAL | Status: DC | PRN

## 2021-06-02 MED ORDER — ONDANSETRON HCL 4 MG/2ML IJ SOLN: 4.0000 mg | Freq: Four times a day (QID) | INTRAMUSCULAR | Status: DC | PRN

## 2021-06-02 MED ORDER — OXYCODONE HCL 5 MG PO TABS
10.0000 mg | ORAL_TABLET | ORAL | Status: DC | PRN
  Administered 2021-06-02 – 2021-06-03 (×3): 10 mg via ORAL
  Filled 2021-06-02 (×3): qty 2

## 2021-06-02 MED ORDER — PROPOFOL 10 MG/ML IV BOLUS
INTRAVENOUS | Status: AC
  Filled 2021-06-02: qty 20

## 2021-06-02 MED ORDER — PHENYLEPHRINE 80 MCG/ML (10ML) SYRINGE FOR IV PUSH (FOR BLOOD PRESSURE SUPPORT)
PREFILLED_SYRINGE | INTRAVENOUS | Status: DC | PRN
  Administered 2021-06-02: 80 ug via INTRAVENOUS
  Administered 2021-06-02: 160 ug via INTRAVENOUS
  Administered 2021-06-02: 80 ug via INTRAVENOUS

## 2021-06-02 MED ORDER — BISACODYL 10 MG RE SUPP: 10.0000 mg | Freq: Every day | RECTAL | Status: DC | PRN

## 2021-06-02 MED ORDER — HYDROMORPHONE HCL 1 MG/ML IJ SOLN
0.2500 mg | INTRAMUSCULAR | Status: DC | PRN
  Administered 2021-06-02 (×2): 0.25 mg via INTRAVENOUS
  Administered 2021-06-02 (×2): 0.5 mg via INTRAVENOUS
  Administered 2021-06-02: 0.25 mg via INTRAVENOUS

## 2021-06-02 MED ORDER — CYCLOBENZAPRINE HCL 10 MG PO TABS
10.0000 mg | ORAL_TABLET | Freq: Three times a day (TID) | ORAL | Status: DC | PRN
  Administered 2021-06-02 – 2021-06-03 (×2): 10 mg via ORAL
  Filled 2021-06-02 (×2): qty 1

## 2021-06-02 MED ORDER — MIDAZOLAM HCL 2 MG/2ML IJ SOLN
INTRAMUSCULAR | Status: AC
  Filled 2021-06-02: qty 2

## 2021-06-02 MED ORDER — LIDOCAINE 2% (20 MG/ML) 5 ML SYRINGE
INTRAMUSCULAR | Status: AC
  Filled 2021-06-02: qty 5

## 2021-06-02 MED ORDER — SUFENTANIL CITRATE 50 MCG/ML IV SOLN
INTRAVENOUS | Status: DC | PRN
  Administered 2021-06-02: 5 ug via INTRAVENOUS
  Administered 2021-06-02: 15 ug via INTRAVENOUS
  Administered 2021-06-02 (×3): 5 ug via INTRAVENOUS

## 2021-06-02 MED ORDER — THROMBIN 5000 UNITS EX SOLR
CUTANEOUS | Status: AC
  Filled 2021-06-02: qty 5000

## 2021-06-02 MED ORDER — ACETAMINOPHEN 500 MG PO TABS
1000.0000 mg | ORAL_TABLET | Freq: Once | ORAL | Status: AC
  Administered 2021-06-02: 1000 mg via ORAL
  Filled 2021-06-02: qty 2

## 2021-06-02 MED ORDER — CHLORHEXIDINE GLUCONATE CLOTH 2 % EX PADS: 6.0000 | MEDICATED_PAD | Freq: Once | CUTANEOUS | Status: DC

## 2021-06-02 MED ORDER — LACTATED RINGERS IV SOLN: INTRAVENOUS | Status: DC

## 2021-06-02 MED ORDER — ROCURONIUM BROMIDE 10 MG/ML (PF) SYRINGE
PREFILLED_SYRINGE | INTRAVENOUS | Status: DC | PRN
  Administered 2021-06-02: 60 mg via INTRAVENOUS

## 2021-06-02 MED ORDER — ACETAMINOPHEN 325 MG PO TABS: 650.0000 mg | ORAL_TABLET | ORAL | Status: DC | PRN

## 2021-06-02 MED ORDER — ORAL CARE MOUTH RINSE: 15.0000 mL | Freq: Once | OROMUCOSAL | Status: DC

## 2021-06-02 MED ORDER — MENTHOL 3 MG MT LOZG: 1.0000 | LOZENGE | OROMUCOSAL | Status: DC | PRN

## 2021-06-02 MED ORDER — CHLORHEXIDINE GLUCONATE 0.12 % MT SOLN: 15.0000 mL | Freq: Once | OROMUCOSAL | Status: DC

## 2021-06-02 MED ORDER — MIDAZOLAM HCL 2 MG/2ML IJ SOLN
INTRAMUSCULAR | Status: DC | PRN
  Administered 2021-06-02: 2 mg via INTRAVENOUS

## 2021-06-02 MED ORDER — BUPIVACAINE-EPINEPHRINE 0.5% -1:200000 IJ SOLN
INTRAMUSCULAR | Status: AC
  Filled 2021-06-02: qty 1

## 2021-06-02 MED ORDER — ONDANSETRON HCL 4 MG/2ML IJ SOLN
INTRAMUSCULAR | Status: DC | PRN
  Administered 2021-06-02: 4 mg via INTRAVENOUS

## 2021-06-02 MED ORDER — VANCOMYCIN HCL IN DEXTROSE 1-5 GM/200ML-% IV SOLN
1000.0000 mg | INTRAVENOUS | Status: AC
  Administered 2021-06-02: 1000 mg via INTRAVENOUS
  Filled 2021-06-02: qty 200

## 2021-06-02 MED ORDER — CLINDAMYCIN PHOSPHATE 900 MG/50ML IV SOLN
INTRAVENOUS | Status: DC | PRN
  Administered 2021-06-02: 900 mg via INTRAVENOUS

## 2021-06-02 MED ORDER — ORAL CARE MOUTH RINSE: 15.0000 mL | Freq: Once | OROMUCOSAL | Status: AC

## 2021-06-02 MED ORDER — DOUBLE ANTIBIOTIC 500-10000 UNIT/GM EX OINT
TOPICAL_OINTMENT | CUTANEOUS | Status: AC
  Filled 2021-06-02: qty 28.4

## 2021-06-02 MED ORDER — ACETAMINOPHEN 650 MG RE SUPP: 650.0000 mg | RECTAL | Status: DC | PRN

## 2021-06-02 MED ORDER — ROCURONIUM BROMIDE 10 MG/ML (PF) SYRINGE
PREFILLED_SYRINGE | INTRAVENOUS | Status: AC
  Filled 2021-06-02: qty 10

## 2021-06-02 MED ORDER — THROMBIN 5000 UNITS EX SOLR: OROMUCOSAL | Status: DC | PRN

## 2021-06-02 MED ORDER — EPHEDRINE 5 MG/ML INJ
INTRAVENOUS | Status: AC
  Filled 2021-06-02: qty 5

## 2021-06-02 MED ORDER — SUGAMMADEX SODIUM 200 MG/2ML IV SOLN
INTRAVENOUS | Status: DC | PRN
  Administered 2021-06-02: 200 mg via INTRAVENOUS

## 2021-06-02 MED ORDER — SODIUM CHLORIDE 0.9% FLUSH: 3.0000 mL | INTRAVENOUS | Status: DC | PRN

## 2021-06-02 MED ORDER — CARVEDILOL 3.125 MG PO TABS
3.1250 mg | ORAL_TABLET | Freq: Two times a day (BID) | ORAL | Status: DC
  Administered 2021-06-02 – 2021-06-03 (×2): 3.125 mg via ORAL
  Filled 2021-06-02 (×2): qty 1

## 2021-06-02 MED ORDER — VANCOMYCIN HCL IN DEXTROSE 1-5 GM/200ML-% IV SOLN: 1000.0000 mg | Freq: Once | INTRAVENOUS | Status: DC

## 2021-06-02 MED ORDER — LIDOCAINE 2% (20 MG/ML) 5 ML SYRINGE
INTRAMUSCULAR | Status: DC | PRN
  Administered 2021-06-02: 60 mg via INTRAVENOUS

## 2021-06-02 MED ORDER — PROPOFOL 10 MG/ML IV BOLUS
INTRAVENOUS | Status: DC | PRN
  Administered 2021-06-02: 100 mg via INTRAVENOUS

## 2021-06-02 MED ORDER — ZOLPIDEM TARTRATE 5 MG PO TABS: 5.0000 mg | ORAL_TABLET | Freq: Every evening | ORAL | Status: DC | PRN

## 2021-06-02 MED ORDER — IRBESARTAN 150 MG PO TABS: 300.0000 mg | ORAL_TABLET | Freq: Every day | ORAL | Status: DC

## 2021-06-02 MED ORDER — ACETAMINOPHEN 500 MG PO TABS
1000.0000 mg | ORAL_TABLET | Freq: Four times a day (QID) | ORAL | Status: DC
  Administered 2021-06-02 – 2021-06-03 (×3): 1000 mg via ORAL
  Filled 2021-06-02 (×3): qty 2

## 2021-06-02 MED ORDER — SODIUM CHLORIDE 0.9 % IV SOLN: INTRAVENOUS | Status: DC | PRN

## 2021-06-02 MED ORDER — VANCOMYCIN HCL IN DEXTROSE 1-5 GM/200ML-% IV SOLN
1000.0000 mg | Freq: Once | INTRAVENOUS | Status: AC
Start: 2021-06-02 — End: 2021-06-02
  Administered 2021-06-02: 1000 mg via INTRAVENOUS
  Filled 2021-06-02: qty 200

## 2021-06-02 MED ORDER — SODIUM CHLORIDE 0.9 % IV SOLN: 250.0000 mL | INTRAVENOUS | Status: DC

## 2021-06-02 SURGICAL SUPPLY — 62 items
BAG COUNTER SPONGE SURGICOUNT (BAG) ×3 IMPLANT
BASKET BONE COLLECTION (BASKET) ×2 IMPLANT
BENZOIN TINCTURE PRP APPL 2/3 (GAUZE/BANDAGES/DRESSINGS) ×2 IMPLANT
BONE VIVIGEN FORMABLE 10CC (Bone Implant) ×2 IMPLANT
BUR MATCHSTICK NEURO 3.0 LAGG (BURR) ×2 IMPLANT
BUR PRECISION FLUTE 6.0 (BURR) ×2 IMPLANT
CAGE LORD XPAC 10X28 (Cage) ×2 IMPLANT
CANISTER SUCT 3000ML PPV (MISCELLANEOUS) ×2 IMPLANT
CARTRIDGE OIL MAESTRO DRILL (MISCELLANEOUS) ×1 IMPLANT
CNTNR URN SCR LID CUP LEK RST (MISCELLANEOUS) ×1 IMPLANT
CONNECTOR EXPEDIUM TI 55MM (Connector) ×2 IMPLANT
CONT SPEC 4OZ STRL OR WHT (MISCELLANEOUS) ×2
COVER BACK TABLE 60X90IN (DRAPES) ×2 IMPLANT
DIFFUSER DRILL AIR PNEUMATIC (MISCELLANEOUS) ×2 IMPLANT
DRAPE C-ARM 42X72 X-RAY (DRAPES) ×4 IMPLANT
DRAPE HALF SHEET 40X57 (DRAPES) ×2 IMPLANT
DRAPE LAPAROTOMY 100X72X124 (DRAPES) ×2 IMPLANT
DRSG OPSITE POSTOP 4X6 (GAUZE/BANDAGES/DRESSINGS) ×2 IMPLANT
DRSG OPSITE POSTOP 4X8 (GAUZE/BANDAGES/DRESSINGS) ×1 IMPLANT
ELECT BLADE 4.0 EZ CLEAN MEGAD (MISCELLANEOUS) ×2
ELECT REM PT RETURN 9FT ADLT (ELECTROSURGICAL) ×2
ELECTRODE BLDE 4.0 EZ CLN MEGD (MISCELLANEOUS) ×1 IMPLANT
ELECTRODE REM PT RTRN 9FT ADLT (ELECTROSURGICAL) ×1 IMPLANT
GLOVE BIO SURGEON STRL SZ8 (GLOVE) ×4 IMPLANT
GLOVE BIO SURGEON STRL SZ8.5 (GLOVE) ×4 IMPLANT
GLOVE BIOGEL PI IND STRL 6.5 (GLOVE) IMPLANT
GLOVE BIOGEL PI IND STRL 7.0 (GLOVE) IMPLANT
GLOVE BIOGEL PI INDICATOR 6.5 (GLOVE) ×2
GLOVE BIOGEL PI INDICATOR 7.0 (GLOVE) ×2
GLOVE SS N UNI LF 8.0 STRL (GLOVE) ×2 IMPLANT
GOWN STRL REUS W/ TWL LRG LVL3 (GOWN DISPOSABLE) IMPLANT
GOWN STRL REUS W/ TWL XL LVL3 (GOWN DISPOSABLE) ×2 IMPLANT
GOWN STRL REUS W/TWL 2XL LVL3 (GOWN DISPOSABLE) IMPLANT
GOWN STRL REUS W/TWL LRG LVL3 (GOWN DISPOSABLE) ×6
GOWN STRL REUS W/TWL XL LVL3 (GOWN DISPOSABLE) ×4
GRAFT BNE MATRIX VG FRMBL L 10 (Bone Implant) IMPLANT
HEMOSTAT POWDER KIT SURGIFOAM (HEMOSTASIS) ×3 IMPLANT
KIT BASIN OR (CUSTOM PROCEDURE TRAY) ×2 IMPLANT
KIT GRAFTMAG DEL NEURO DISP (NEUROSURGERY SUPPLIES) ×1 IMPLANT
KIT TURNOVER KIT B (KITS) ×2 IMPLANT
NDL HYPO 21X1.5 SAFETY (NEEDLE) IMPLANT
NEEDLE HYPO 21X1.5 SAFETY (NEEDLE) ×2 IMPLANT
NEEDLE HYPO 22GX1.5 SAFETY (NEEDLE) ×2 IMPLANT
NS IRRIG 1000ML POUR BTL (IV SOLUTION) ×2 IMPLANT
OIL CARTRIDGE MAESTRO DRILL (MISCELLANEOUS) ×2
PACK LAMINECTOMY NEURO (CUSTOM PROCEDURE TRAY) ×2 IMPLANT
PAD ARMBOARD 7.5X6 YLW CONV (MISCELLANEOUS) ×6 IMPLANT
PUTTY DBM 5CC CALC GRAN (Putty) ×2 IMPLANT
ROD EXPEDIUM 5.5 300MM (Rod) ×2 IMPLANT
SCREW SET SINGLE INNER (Screw) ×8 IMPLANT
SCREW VIPER 8X45MM (Screw) ×8 IMPLANT
STRIP CLOSURE SKIN 1/2X4 (GAUZE/BANDAGES/DRESSINGS) ×2 IMPLANT
SUT VIC AB 1 CT1 18XBRD ANBCTR (SUTURE) ×2 IMPLANT
SUT VIC AB 1 CT1 8-18 (SUTURE) ×4
SUT VIC AB 2-0 CP2 18 (SUTURE) ×4 IMPLANT
SYR 20ML LL LF (SYRINGE) ×1 IMPLANT
TAP EXPEDIUM DL 7.0 (INSTRUMENTS) ×2
TAP EXPEDIUM DL 7X2 (INSTRUMENTS) IMPLANT
TOWEL GREEN STERILE (TOWEL DISPOSABLE) ×2 IMPLANT
TOWEL GREEN STERILE FF (TOWEL DISPOSABLE) ×2 IMPLANT
TRAY FOLEY MTR SLVR 16FR STAT (SET/KITS/TRAYS/PACK) ×2 IMPLANT
WATER STERILE IRR 1000ML POUR (IV SOLUTION) ×2 IMPLANT

## 2021-06-02 NOTE — Anesthesia Procedure Notes (Signed)
Procedure Name: Intubation Date/Time: 06/02/2021 11:13 AM Performed by: Moshe Salisbury, CRNA Pre-anesthesia Checklist: Patient identified, Emergency Drugs available, Suction available and Patient being monitored Patient Re-evaluated:Patient Re-evaluated prior to induction Oxygen Delivery Method: Circle System Utilized Preoxygenation: Pre-oxygenation with 100% oxygen Induction Type: IV induction Ventilation: Mask ventilation without difficulty Laryngoscope Size: Mac and 3 Grade View: Grade II Tube type: Oral Number of attempts: 1 Airway Equipment and Method: Stylet Placement Confirmation: ETT inserted through vocal cords under direct vision, positive ETCO2 and breath sounds checked- equal and bilateral Secured at: 21 cm Tube secured with: Tape Dental Injury: Teeth and Oropharynx as per pre-operative assessment

## 2021-06-02 NOTE — Transfer of Care (Signed)
Immediate Anesthesia Transfer of Care Note  Patient: NAKEISHA GREENHOUSE  Procedure(s) Performed: LUMBAR ONE-TWO POSTERIOR LUMBAR INTERBODY FUSION WITH THORACIC TWELVE-LUMBAR ONE ARTHRODESIS AND EXTENSION TO PREVIOUS LUMBAR FUSION (Spine Lumbar)  Patient Location: PACU  Anesthesia Type:General  Level of Consciousness: awake and patient cooperative  Airway & Oxygen Therapy: Patient Spontanous Breathing and Patient connected to face mask oxygen  Post-op Assessment: Report given to RN, Post -op Vital signs reviewed and stable and Patient moving all extremities X 4  Post vital signs: Reviewed and stable  Last Vitals:  Vitals Value Taken Time  BP 127/79 06/02/21 1717  Temp    Pulse 71 06/02/21 1719  Resp 10 06/02/21 1719  SpO2 100 % 06/02/21 1719  Vitals shown include unvalidated device data.  Last Pain:  Vitals:   06/02/21 0938  TempSrc:   PainSc: 0-No pain         Complications: No notable events documented.

## 2021-06-02 NOTE — Anesthesia Procedure Notes (Signed)
Arterial Line Insertion Performed by: Valda Favia, CRNA, CRNA  Patient location: Pre-op. Preanesthetic checklist: patient identified, IV checked, site marked, risks and benefits discussed, surgical consent, monitors and equipment checked, pre-op evaluation, timeout performed and anesthesia consent Lidocaine 1% used for infiltration Left, radial was placed Catheter size: 20 G Hand hygiene performed , maximum sterile barriers used  and Seldinger technique used Allen's test indicative of satisfactory collateral circulation Attempts: 3 (2 attempts by A. Boyce Medici, CRNA) Procedure performed without using ultrasound guided technique. Following insertion, Biopatch and dressing applied. Post procedure assessment: normal  Post procedure complications: second provider assisted. Patient tolerated the procedure well with no immediate complications.

## 2021-06-02 NOTE — Anesthesia Postprocedure Evaluation (Signed)
Anesthesia Post Note  Patient: Molly Mitchell  Procedure(s) Performed: LUMBAR ONE-TWO POSTERIOR LUMBAR INTERBODY FUSION WITH THORACIC TWELVE-LUMBAR ONE ARTHRODESIS AND EXTENSION TO PREVIOUS LUMBAR FUSION (Spine Lumbar)     Patient location during evaluation: PACU Anesthesia Type: General Level of consciousness: awake and alert Pain management: pain level controlled Vital Signs Assessment: post-procedure vital signs reviewed and stable Respiratory status: spontaneous breathing, nonlabored ventilation and respiratory function stable Cardiovascular status: blood pressure returned to baseline and stable Postop Assessment: no apparent nausea or vomiting Anesthetic complications: no   No notable events documented.  Last Vitals:  Vitals:   06/02/21 1730 06/02/21 1745  BP: (!) 145/74 (!) 155/79  Pulse: 71 71  Resp: 15 11  Temp:    SpO2: 98% 95%    Last Pain:  Vitals:   06/02/21 1745  TempSrc:   PainSc: 5                  Ellar Hakala,W. EDMOND

## 2021-06-02 NOTE — H&P (Signed)
Subjective: The patient is a 76 year old white female on whom I previously performed lumbar fusion.  She has developed recurrent back and bilateral leg pain.  She has failed medical management.  She was worked up with a lumbar myelo CT.  This demonstrated the patient had adjacent segment disease, spinal stenosis, etc.  I discussed the various treatment options with her.  She has decided to proceed with surgery.  Past Medical History:  Diagnosis Date   Arthritis    Cancer ([place])    skin cancer removed from nose and forehead   Connective tissue disease ([place])    pt reports being diagnosed with undifferentiated connective tissue disease at some point- pt took steroids for this and has had no issues since- 10 years ago   Heart murmur    as a child some dr say they hear it some say they don't   HLD (hyperlipidemia)    Hypertension    Neuromuscular disorder ([place])    weakness in legs when getting up   Stenosis of artery in neck ([place]) 09/07/2017    Past Surgical History:  Procedure Laterality Date   ABDOMINAL HYSTERECTOMY  1981   AIKEN OSTEOTOMY Right 01/30/2018   Procedure: WEIL RIGHT 2ND AND 3RD;  Surgeon: [name], DPM;  Location: [place];  Service: Podiatry;  Laterality: Right;   BACK SURGERY     pt reports hx of 3 lumber back surgeries total   CERVICAL SPINE SURGERY     pt reports having 3 cervical fusions total   CORRECTION HAMMER TOE     HAMMER TOE SURGERY Right 01/30/2018   Procedure: HAMMER TOE CORRECTION SECOND AND THIRD;  Surgeon: [name], DPM;  Location: [place];  Service: Podiatry;  Laterality: Right;  GENERAL WITH LOCAL   HAMMER TOE SURGERY Left 03/05/2019   Procedure: HAMMER TOE CORRECTION T1 AND T2;  Surgeon: [name], DPM;  Location: [place];  Service: Podiatry;  Laterality: Left;   JOINT REPLACEMENT Left 03/03/2013   knee   MRI     ROTATOR CUFF REPAIR Right 1981   SPINAL FUSION  2001   lower back    SPINAL FUSION   2003   neck   TOTAL KNEE REVISION Left 07/31/2016   Procedure: TOTAL KNEE REVISION;  Surgeon: Dereck Leep, MD;  Location: [facility];  Service: Orthopedics;  Laterality: Left;   WEIL OSTEOTOMY Left 03/05/2019   Procedure: WEIL OSTEOTOMY X 2 LEFT;  Surgeon: [name], DPM;  Location: [place];  Service: Podiatry;  Laterality: Left;    Allergies  Allergen Reactions   Amitriptyline Hcl Swelling    Facial swelling   Duricef [Cefadroxil] Swelling    Facial swelling   Levofloxacin Other (See Comments)    Severe Shoulder Pain   Tetanus Toxoid Swelling    Arm swelling   Tramadol Itching   Erythromycin Rash and Other (See Comments)    Rash on bottom    Social History   Tobacco Use   Smoking status: Former    Types: Cigarettes    Quit date: 04/17/1979    Years since quitting: 42.1   Smokeless tobacco: Never  Substance Use Topics   Alcohol use: No    Alcohol/week: 0.0 standard drinks    Family History  Problem Relation Age of Onset   Hypertension Brother    Hypertension Mother    Hypertension Sister    Cancer Sister    Diabetes Paternal Grandmother    Heart failure Maternal Grandmother  Prior to Admission medications   Medication Sig Start Date End Date Taking? Authorizing Provider  amLODipine (NORVASC) 2.5 MG tablet Take 2.5 mg by mouth in the morning and at bedtime.   Yes [provider]  carvedilol (COREG) 3.125 MG tablet Take 3.125 mg by mouth 2 (two) times daily with a meal.   Yes [provider]  pravastatin (PRAVACHOL) 40 MG tablet Take 40 mg by mouth at bedtime.   Yes [provider]  telmisartan (MICARDIS) 80 MG tablet Take 80 mg by mouth daily.   Yes [provider]  amoxicillin (AMOXIL) 500 MG capsule Take 4 tablets by mouth 1 hour prior to dental procedures. 01/26/20   Chrismon, Vickki Muff, PA-C  LORazepam (ATIVAN) 0.5 MG tablet Take 1 tablet by mouth 1 hour prior to procedure. May repeat upon arrival if BP  elevated. Patient not taking: Reported on 05/24/2021 02/12/20   Chrismon, Vickki Muff, PA-C  meloxicam (MOBIC) 15 MG tablet Take 1 tablet (15 mg total) by mouth daily. Patient not taking: Reported on 05/24/2021 10/14/20   Jerrol Banana., MD  nystatin-triamcinolone ointment [facility]) USE 1 TOPICAL APPLICATION TWICE DAILY Patient not taking: Reported on 05/24/2021 02/25/18   Chrismon, Vickki Muff, PA-C     Review of Systems  Positive ROS: As above  All other systems have been reviewed and were otherwise negative with the exception of those mentioned in the HPI and as above.  Objective: Vital signs in last 24 hours: Temp:  [98.2 F (36.8 C)] 98.2 F (36.8 C) (05/18 0912) Pulse Rate:  [70-78] 70 (05/18 0943) Resp:  [18] 18 (05/18 0912) BP: (152-173)/(81-82) 152/81 (05/18 0943) SpO2:  [99 %] 99 % (05/18 0912) Weight:  [74.8 kg] 74.8 kg (05/18 0912) Estimated body mass index is 29.7 kg/m as calculated from the following:   Height as of this encounter: 5' 2.5" (1.588 m).   Weight as of this encounter: 74.8 kg.   General Appearance: Alert Head: Normocephalic, without obvious abnormality, atraumatic Eyes: PERRL, conjunctiva/corneas clear, EOM's intact,    Ears: Normal  Throat: Normal  Neck: Supple, Back: The patient's lumbar incision is well-healed. Lungs: Clear to auscultation bilaterally, respirations unlabored Heart: Regular rate and rhythm, no murmur, rub or gallop Abdomen: Soft, non-tender Extremities: Extremities normal, atraumatic, no cyanosis or edema Skin: unremarkable  NEUROLOGIC:   Mental status: alert and oriented,Motor Exam - grossly normal Sensory Exam - grossly normal Reflexes:  Coordination - grossly normal Gait - grossly normal Balance - grossly normal Cranial Nerves: I: smell Not tested  II: visual acuity  OS: Normal  OD: Normal   II: visual fields Full to confrontation  II: pupils Equal, round, reactive to light  III,VII: ptosis None  III,IV,VI: extraocular  muscles  Full ROM  V: mastication Normal  V: facial light touch sensation  Normal  V,VII: corneal reflex  Present  VII: facial muscle function - upper  Normal  VII: facial muscle function - lower Normal  VIII: hearing Not tested  IX: soft palate elevation  Normal  IX,X: gag reflex Present  XI: trapezius strength  5/5  XI: sternocleidomastoid strength 5/5  XI: neck flexion strength  5/5  XII: tongue strength  Normal    Data Review Lab Results  Component Value Date   WBC 8.2 05/30/2021   HGB 13.4 05/30/2021   HCT 40.6 05/30/2021   MCV 95.5 05/30/2021   PLT 266 05/30/2021   Lab Results  Component Value Date   NA 137 05/30/2021  K 4.1 05/30/2021   CL 101 05/30/2021   CO2 25 05/30/2021   BUN 26 (H) 05/30/2021   CREATININE 0.97 05/30/2021   GLUCOSE 95 05/30/2021   Lab Results  Component Value Date   INR 0.96 07/17/2016    Assessment/Plan: T12-L1 and L1-2 spinal stasis, degenerative disease, spinal stenosis, lumbago, lumbar radiculopathy, neurogenic claudication: I have discussed the situation with the patient.  I reviewed her myelo CT with her and pointed out the abnormalities.  We have discussed the various treatment options including surgery.  I have described the surgical treatment option of an exploration of lumbar fusion with extension of her decompression, instrumentation and fusion to T10.  I have shown her surgical models.  I have given her a surgical pamphlet.  We have discussed the risk, benefits, alternatives, expected postop course, and likelihood of achieving our goals with surgery.  I have answered all her questions.  She has decided proceed with surgery.   Ophelia Charter 06/02/2021 10:15 AM

## 2021-06-02 NOTE — Progress Notes (Signed)
Orthopedic Tech Progress Note Patient Details:  Molly Mitchell Jun 27, 1945 [id number]  Patient ID: Molly Mitchell, female   DOB: 1945/01/19, 76 y.o.   MRN: [id number] I spoke with patients RN, they said the patient already has their brace. Karolee Stamps 06/02/2021, 8:22 PM

## 2021-06-02 NOTE — Progress Notes (Signed)
Pharmacy Antibiotic Note  Molly Mitchell is a 76 y.o. female admitted on 06/02/2021 with lumbar fusion. Pharmacy has been consulted for vancomycin dosing for surgical prophylaxis. No drain in place.   Plan: Vancomycin '1000mg'$  x1   Height: 5' 2.5" (158.8 cm) Weight: 74.8 kg (165 lb) IBW/kg (Calculated) : 51.25  Temp (24hrs), Avg:97.8 F (36.6 C), Min:97.5 F (36.4 C), Max:98.2 F (36.8 C)  Recent Labs  Lab 05/30/21 1330  WBC 8.2  CREATININE 0.97    Estimated Creatinine Clearance: 48 mL/min (by C-G formula based on SCr of 0.97 mg/dL).    Allergies  Allergen Reactions   Amitriptyline Hcl Swelling    Facial swelling   Duricef [Cefadroxil] Swelling    Facial swelling   Levofloxacin Other (See Comments)    Severe Shoulder Pain   Tetanus Toxoid Swelling    Arm swelling   Tramadol Itching   Erythromycin Rash and Other (See Comments)    Rash on bottom    Thank you for allowing pharmacy to be a part of this patient's care.  Cristela Felt, PharmD, BCPS Clinical Pharmacist 06/02/2021 7:47 PM

## 2021-06-02 NOTE — Op Note (Signed)
Brief history: The patient is a 76 year old white female whose had multiple prior back surgeries.  She has developed recurrent back and leg pain consistent with neurogenic claudication.  She failed medical management and was worked up with a lumbar myelo CT.  This demonstrated adjacent segment disease, spinal stenosis, etc.  I discussed the various treatment options.  She has decided proceed with surgery.  Preoperative diagnosis: T12-L1 and L1-2 degenerative disc disease, spinal stenosis compressing both the L1 and the L2 nerve roots; lumbago; lumbar radiculopathy; neurogenic claudication  Postoperative diagnosis: The same  Procedure: Bilateral T12-L1 and L1-2 laminotomy/foraminotomies/medial facetectomy to decompress the bilateral L1 and L2 nerve roots(the work required to do this was in addition to the work required to do the posterior lumbar interbody fusion because of the patient's spinal stenosis, facet arthropathy. Etc. requiring a wide decompression of the nerve roots.);  L1-2 posterior  lumbar interbody fusion with local morselized autograft bone, Osteocel and Zimmer DBM; insertion of interbody prosthesis at L1-2 (Synthes peek expandable interbody prosthesis); posterior segmental instrumentation from T10 to S1 with globus titanium pedicle screws and rods; posterior lateral arthrodesis at T10-11, T11-12, T12-L1, L1-2 with local morselized autograft bone and Zimmer DBM; exploration of lumbar fusion/removal of lumbar hardware.  Surgeon: Dr. Earle Gell  Asst.: Arnetha Massy, NP  Anesthesia: Gen. endotracheal  Estimated blood loss: 300 cc   Drains: None  Complications: None  Description of procedure: The patient was brought to the operating room by the anesthesia team. General endotracheal anesthesia was induced. The patient was turned to the prone position on the Wilson frame. The patient's lumbosacral region was then prepared with Betadine scrub and Betadine solution. Sterile drapes were  applied.  I then injected the area to be incised with Marcaine with epinephrine solution. I then used the scalpel to make a linear midline incision from T10 to approximately L4. I then used electrocautery to perform a bilateral subperiosteal dissection exposing the spinous process and lamina of T10, T11, T12, L1, L2, L3, L4 and to expose the old hardware from L2-L5. We then inserted the Verstrac retractor to provide exposure.  I began the decompression by using the high speed drill to perform laminotomies at bilaterally at T12-L1 and L1-2. We then used the Kerrison punches to widen the laminotomy and removed the ligamentum flavum at bilaterally at T12-L1 and L1-2. We used the Kerrison punches to remove the medial facets at L1-2. We performed wide foraminotomies about the bilateral L1 and L2 nerve roots completing the decompression.  We now turned our attention to the posterior lumbar interbody fusion. I used a scalpel to incise the intervertebral disc at T12-L1 and L1-2.  The disc space was quite collapsed at T12-L1.  We could not enter into the T12-L1 to space space.  I then performed a partial intervertebral discectomy at L1-2 bilaterally using the pituitary forceps. We prepared the vertebral endplates at U2-7 bilaterally for the fusion by removing the soft tissues with the curettes. We then used the trial spacers to pick the appropriate sized interbody prosthesis. We prefilled his prosthesis with a combination of local morselized autograft bone that we obtained during the decompression as well as Zimmer DBM. We inserted the prefilled prosthesis into the interspace at L1-2 bilaterally, being careful to stay lateral to the thecal sac.  We then expanded the prosthesis. There was a good snug fit of the prosthesis in the interspace. We then filled and the remainder of the intervertebral disc space with local morselized autograft bone and Zimmer  DBM. This completed the posterior lumbar interbody arthrodesis at L1  to.  During the decompression and insertion of the prosthesis the assistant protected the thecal sac and nerve roots with the D'Errico retractor.  We now turned attention to the instrumentation. Under fluoroscopic guidance we cannulated the bilateral T10, T11, T12, and L1 pedicles with the bone probe. We then removed the bone probe. We then tapped the pedicle with a 7.0 millimeter tap. We then removed the tap. We probed inside the tapped pedicle with a ball probe to rule out cortical breaches. We then inserted a 8 x 45 millimeter pedicle screw into the pedicles bilaterally at T10, T11, T12 and L1 under fluoroscopic guidance. We then palpated along the medial aspect of the pedicles to rule out cortical breaches. There were none. The nerve roots were not injured.  In order to make enough room for the side connectors we remove the old cross connector.  We then connected the unilateral pedicle screws from T10-L1 to the old rod using side connectors and a Z rod.  We then tightened the caps appropriately. This completed the instrumentation from T10-S1 bilaterally.  We now turned our attention to the posterior lateral arthrodesis at T10-11, T11-12, T12-L1, and L1-2. We used the high-speed drill to decorticate the remainder of the facets, pars, transverse process at T10-11, T11-12, T12-L1, and L1-2. We then applied a combination of local morselized autograft bone and Zimmer DBM over these decorticated posterior lateral structures. This completed the posterior lateral arthrodesis.  We then obtained hemostasis using bipolar electrocautery. We irrigated the wound out with bacitracin solution. We inspected the thecal sac and nerve roots and noted they were well decompressed. We then removed the retractor.  We injected Exparel . We reapproximated patient's thoracolumbar fascia with interrupted #1 Vicryl suture. We reapproximated patient's subcutaneous tissue with interrupted 2-0 Vicryl suture. The reapproximated patient's  skin with Steri-Strips and benzoin. The wound was then coated with bacitracin ointment. A sterile dressing was applied. The drapes were removed. The patient was subsequently returned to the supine position where they were extubated by the anesthesia team. He was then transported to the post anesthesia care unit in stable condition. All sponge instrument and needle counts were reportedly correct at the end of this case.

## 2021-06-03 LAB — BASIC METABOLIC PANEL
Anion gap: 9 (ref 5–15)
BUN: 15 mg/dL (ref 8–23)
CO2: 24 mmol/L (ref 22–32)
Calcium: 8.6 mg/dL — ABNORMAL LOW (ref 8.9–10.3)
Chloride: 102 mmol/L (ref 98–111)
Creatinine, Ser: 0.92 mg/dL (ref 0.44–1.00)
GFR, Estimated: >60 mL/min (ref >60)
Glucose, Bld: 178 mg/dL — ABNORMAL HIGH (ref 70–99)
Potassium: 4.4 mmol/L (ref 3.5–5.1)
Sodium: 135 mmol/L (ref 135–145)

## 2021-06-03 LAB — CBC
HCT: 33.6 % — ABNORMAL LOW (ref 36.0–46.0)
Hemoglobin: 11.6 g/dL — ABNORMAL LOW (ref 12.0–15.0)
MCH: 32.1 pg (ref 26.0–34.0)
MCHC: 34.5 g/dL (ref 30.0–36.0)
MCV: 93.1 fL (ref 80.0–100.0)
Platelets: 229 10*3/uL (ref 150–400)
RBC: 3.61 MIL/uL — ABNORMAL LOW (ref 3.87–5.11)
RDW: 14.3 % (ref 11.5–15.5)
WBC: 18.9 10*3/uL — ABNORMAL HIGH (ref 4.0–10.5)
nRBC: 0 % (ref 0.0–0.2)

## 2021-06-03 MED ORDER — DOCUSATE SODIUM 100 MG PO CAPS: 100.0000 mg | ORAL_CAPSULE | Freq: Two times a day (BID) | ORAL | 0 refills | Status: DC

## 2021-06-03 MED ORDER — OXYCODONE-ACETAMINOPHEN 5-325 MG PO TABS
1.0000 | ORAL_TABLET | ORAL | Status: DC | PRN
  Administered 2021-06-03: 2 via ORAL
  Filled 2021-06-03: qty 2

## 2021-06-03 MED ORDER — OXYCODONE-ACETAMINOPHEN 5-325 MG PO TABS: 1.0000 | ORAL_TABLET | ORAL | 0 refills | Status: DC | PRN

## 2021-06-03 MED ORDER — CYCLOBENZAPRINE HCL 5 MG PO TABS: 5.0000 mg | ORAL_TABLET | Freq: Three times a day (TID) | ORAL | 0 refills | Status: DC | PRN

## 2021-06-03 MED ORDER — CYCLOBENZAPRINE HCL 5 MG PO TABS: 5.0000 mg | ORAL_TABLET | Freq: Three times a day (TID) | ORAL | Status: DC | PRN

## 2021-06-03 MED FILL — Heparin Sodium (Porcine) Inj 1000 Unit/ML: INTRAMUSCULAR | Qty: 30 | Status: AC

## 2021-06-03 MED FILL — Sodium Chloride IV Soln 0.9%: INTRAVENOUS | Qty: 1000 | Status: AC

## 2021-06-03 MED FILL — Thrombin For Soln 5000 Unit: CUTANEOUS | Qty: 5000 | Status: AC

## 2021-06-03 NOTE — Discharge Summary (Signed)
Physician Discharge Summary  Patient ID: Molly Mitchell MRN: [id number] DOB/AGE: 1945/04/23 76 y.o.  Admit date: 06/02/2021 Discharge date: 06/03/2021  Admission Diagnoses: Lumbar degenerative disease, lumbar spinal stenosis, lumbar radiculopathy, neurogenic claudication, lumbago  Discharge Diagnoses: The same Principal Problem:   Lumbar adjacent segment disease with spondylolisthesis   Discharged Condition: good  Hospital Course: I performed extension of decompression, instrumentation and fusion to T10 on the patient on 06/02/2021.  The surgery went well.  The patient's postoperative course was unremarkable.  On postoperative day #1 she requested discharge to home.  She was given verbal and written discharge instructions.  All her questions were answered.  Consults: PT, OT, care management Significant Diagnostic Studies: None Treatments: T12-L1 and L1-2 decompression, instrumentation; extension of lumbar fusion to T12 Discharge Exam: Blood pressure 111/82, pulse 81, temperature 98.5 F (36.9 C), temperature source Oral, resp. rate 18, height 5' 2.5" (1.588 m), weight 74.8 kg, SpO2 99 %. The patient is alert and pleasant.  Her strength is normal.  She looks well.  Disposition: Home  Discharge Instructions     Call MD for:  difficulty breathing, headache or visual disturbances   Complete by: As directed    Call MD for:  extreme fatigue   Complete by: As directed    Call MD for:  hives   Complete by: As directed    Call MD for:  persistant dizziness or light-headedness   Complete by: As directed    Call MD for:  persistant nausea and vomiting   Complete by: As directed    Call MD for:  redness, tenderness, or signs of infection (pain, swelling, redness, odor or green/yellow discharge around incision site)   Complete by: As directed    Call MD for:  severe uncontrolled pain   Complete by: As directed    Call MD for:  temperature >100.4   Complete by: As directed    Diet - low  sodium heart healthy   Complete by: As directed    Discharge instructions   Complete by: As directed    Call [phone] for a followup appointment. Take a stool softener while you are using pain medications.   Driving Restrictions   Complete by: As directed    Do not drive for 2 weeks.   Increase activity slowly   Complete by: As directed    Lifting restrictions   Complete by: As directed    Do not lift more than 5 pounds. No excessive bending or twisting.   May shower / Bathe   Complete by: As directed    Remove the dressing for 3 days after surgery.  You may shower, but leave the incision alone.   Remove dressing in 48 hours   Complete by: As directed       Allergies as of 06/03/2021       Reactions   Amitriptyline Hcl Swelling   Facial swelling   Duricef [cefadroxil] Swelling   Facial swelling   Levofloxacin Other (See Comments)   Severe Shoulder Pain   Tetanus Toxoid Swelling   Arm swelling   Tramadol Itching   Erythromycin Rash, Other (See Comments)   Rash on bottom        Medication List     STOP taking these medications    LORazepam 0.5 MG tablet Commonly known as: ATIVAN   meloxicam 15 MG tablet Commonly known as: MOBIC   nystatin-triamcinolone ointment Commonly known as: MYCOLOG       TAKE these medications  amLODipine 2.5 MG tablet Commonly known as: NORVASC Take 2.5 mg by mouth in the morning and at bedtime.   amoxicillin 500 MG capsule Commonly known as: AMOXIL Take 4 tablets by mouth 1 hour prior to dental procedures.   carvedilol 3.125 MG tablet Commonly known as: COREG Take 3.125 mg by mouth 2 (two) times daily with a meal.   cyclobenzaprine 5 MG tablet Commonly known as: FLEXERIL Take 1 tablet (5 mg total) by mouth 3 (three) times daily as needed for muscle spasms.   docusate sodium 100 MG capsule Commonly known as: COLACE Take 1 capsule (100 mg total) by mouth 2 (two) times daily.   oxyCODONE-acetaminophen 5-325 MG  tablet Commonly known as: PERCOCET/ROXICET Take 1-2 tablets by mouth every 4 (four) hours as needed for moderate pain.   pravastatin 40 MG tablet Commonly known as: PRAVACHOL Take 40 mg by mouth at bedtime.   telmisartan 80 MG tablet Commonly known as: MICARDIS Take 80 mg by mouth daily.         Signed: Ophelia Charter 06/03/2021, 7:33 AM

## 2021-06-03 NOTE — TOC CM/SW Note (Signed)
MD office arranged home health services with [facility].   Molly Mitchell will follow for orders.   3[facility] staff will provide any needed DME

## 2021-06-03 NOTE — Evaluation (Signed)
Physical Therapy Evaluation & Discharge Patient Details Name: Molly Mitchell MRN: [id number] DOB: 1945/01/30 Today's Date: 06/03/2021  History of Present Illness  76 y/o female admitted 06/02/21 following PLIF T12-L1 and L1-2 with extension of fusion to T10. PMH: HTN, neuromuscular disorder.  Clinical Impression  Patient admitted following above procedure. Patient functioning at modI level for mobility with no AD with baseline gait abnormalities due to hx of R TKA with revision resulting in residual weakness and ROM deficits. Able to safely negotiate stairs to access home environment. Educated patient on back precautions, brace wear, and activity progression at discharge, patient demonstrated understanding. Patient has cane at home and encouraged use if ambulating outdoors or during increased pain. No further skilled PT needs identified acutely. No PT follow up recommended at this time.        Recommendations for follow up therapy are one component of a multi-disciplinary discharge planning process, led by the attending physician.  Recommendations may be updated based on patient status, additional functional criteria and insurance authorization.  Follow Up Recommendations No PT follow up    Assistance Recommended at Discharge Intermittent Supervision/Assistance  Patient can return home with the following       Equipment Recommendations None recommended by PT  Recommendations for Other Services       Functional Status Assessment Patient has had a recent decline in their functional status and demonstrates the ability to make significant improvements in function in a reasonable and predictable amount of time.     Precautions / Restrictions Precautions Precautions: Back Precaution Booklet Issued: Yes (comment) Precaution Comments: reviewed with pt Required Braces or Orthoses: Spinal Brace Spinal Brace: Lumbar corset;Applied in sitting position Restrictions Weight Bearing Restrictions: No       Mobility  Bed Mobility               General bed mobility comments: sitting EOB on arrival    Transfers Overall transfer level: Modified independent Equipment used: None                    Ambulation/Gait Ambulation/Gait assistance: Modified independent (Device/Increase time) Gait Distance (Feet): 300 Feet Assistive device: None Gait Pattern/deviations: Decreased stance time - right, Step-through pattern, Decreased stride length, Knee flexed in stance - right Gait velocity: decreased     General Gait Details: hx of R TKA and revision with weakness and impaired ROM. Gait pattern is baseline per patient  Stairs Stairs: Yes Stairs assistance: Modified independent (Device/Increase time) Stair Management: One rail Left, Step to pattern, Forwards Number of Stairs: 5    Wheelchair Mobility    Modified Rankin (Stroke Patients Only)       Balance Overall balance assessment: Mild deficits observed, not formally tested                                           Pertinent Vitals/Pain Pain Assessment Pain Assessment: Faces Faces Pain Scale: Hurts a little bit Pain Location: back- incisional Pain Descriptors / Indicators: Discomfort, Operative site guarding Pain Intervention(s): Limited activity within patient's tolerance, Monitored during session    Home Living Family/patient expects to be discharged to:: Private residence Living Arrangements: Spouse/significant other Available Help at Discharge: Family;Available 24 hours/day Type of Home: House Home Access: Stairs to enter Entrance Stairs-Rails: Can reach both Entrance Stairs-Number of Steps: 4   Home Layout: One level Home Equipment: Kasandra Knudsen -  single point;Shower seat;Grab bars - tub/shower;Adaptive equipment      Prior Function Prior Level of Function : Independent/Modified Independent                     Hand Dominance        Extremity/Trunk Assessment   Upper  Extremity Assessment Upper Extremity Assessment: Defer to OT evaluation    Lower Extremity Assessment Lower Extremity Assessment: Generalized weakness (hx of R TKA and revision - residual weakness and ROM deficits)    Cervical / Trunk Assessment Cervical / Trunk Assessment: Back Surgery  Communication   Communication: No difficulties  Cognition Arousal/Alertness: Awake/alert Behavior During Therapy: WFL for tasks assessed/performed Overall Cognitive Status: Within Functional Limits for tasks assessed                                          General Comments General comments (skin integrity, edema, etc.): discussed IADLs and back precautions    Exercises     Assessment/Plan    PT Assessment Patient does not need any further PT services  PT Problem List         PT Treatment Interventions      PT Goals (Current goals can be found in the Care Plan section)  Acute Rehab PT Goals Patient Stated Goal: to go home PT Goal Formulation: All assessment and education complete, DC therapy    Frequency       Co-evaluation               AM-PAC PT "6 Clicks" Mobility  Outcome Measure Help needed turning from your back to your side while in a flat bed without using bedrails?: None Help needed moving from lying on your back to sitting on the side of a flat bed without using bedrails?: None Help needed moving to and from a bed to a chair (including a wheelchair)?: None Help needed standing up from a chair using your arms (e.g., wheelchair or bedside chair)?: None Help needed to walk in hospital room?: None Help needed climbing 3-5 steps with a railing? : None 6 Click Score: 24    End of Session Equipment Utilized During Treatment: Back brace Activity Tolerance: Patient tolerated treatment well Patient left: in bed;with call bell/phone within reach (sitting EOB) Nurse Communication: Mobility status PT Visit Diagnosis: Muscle weakness (generalized)  (M62.81)    Time: 3300-7622 PT Time Calculation (min) (ACUTE ONLY): 12 min   Charges:   PT Evaluation $PT Eval Moderate Complexity: 1 Mod          Akio Hudnall A. Gilford Rile PT, DPT Acute Rehabilitation Services Pager [phone] Office [phone]   Linna Hoff 06/03/2021, 9:54 AM

## 2021-06-03 NOTE — Evaluation (Signed)
Occupational Therapy Evaluation Patient Details Name: Molly Mitchell MRN: [id number] DOB: 10-04-1945 Today's Date: 06/03/2021   History of Present Illness 76 y/o female admitted 06/02/21 following PLIF T12-L1 and L1-2 with extension of fusion to T10. PMH: HTN, neuromuscular disorder.   Clinical Impression   PTA Patient independent.  Admitted for above and presents with problem list below.  Pt educated on back precautions, Adl compensatory techniques, AE/DME, brace management and wear schedule, activity progression, and IADL engagement.  Patient reports her spouse can assist as needed at dc.  She demonstrates ability to complete UB ADLs with supervision, LB Adls with up to min assist, transfers with modified independence and mobility with supervision. Simulated tub transfers with supervision, and pt reports she has a Dane and AE (reacher, long sponge) to use as needed. Based on performance today, no further OT needs identified and OT will sign off.  Thank you for this referral.      Recommendations for follow up therapy are one component of a multi-disciplinary discharge planning process, led by the attending physician.  Recommendations may be updated based on patient status, additional functional criteria and insurance authorization.   Follow Up Recommendations  No OT follow up    Assistance Recommended at Discharge Intermittent Supervision/Assistance  Patient can return home with the following A little help with bathing/dressing/bathroom;Assistance with cooking/housework    Functional Status Assessment  Patient has had a recent decline in their functional status and demonstrates the ability to make significant improvements in function in a reasonable and predictable amount of time.  Equipment Recommendations  None recommended by OT (has needed DME)    Recommendations for Other Services PT consult     Precautions / Restrictions Precautions Precautions: Back Precaution Booklet Issued: Yes  (comment) Precaution Comments: reviewed with pt Required Braces or Orthoses: Spinal Brace Spinal Brace: Lumbar corset;Applied in sitting position Restrictions Weight Bearing Restrictions: No      Mobility Bed Mobility               General bed mobility comments: OOB upon entry, pt able to verbalize log roll technique    Transfers                          Balance Overall balance assessment: Mild deficits observed, not formally tested                                         ADL either performed or assessed with clinical judgement   ADL Overall ADL's : Needs assistance/impaired     Grooming: Modified independent;Standing       Lower Body Bathing: Supervison/ safety;Sit to/from stand Lower Body Bathing Details (indicate cue type and reason): reviewed recommendations to sit for LB and use long sponge Upper Body Dressing : Set up;Sitting Upper Body Dressing Details (indicate cue type and reason): min cueing for brace mgmt Lower Body Dressing: Minimal assistance;Sit to/from stand Lower Body Dressing Details (indicate cue type and reason): requires assist for socks, reviewed compensatory techniques and AE.  Pt plans to have spouse assist if needs assist for socks. Toilet Transfer: Modified Independent;Ambulation   Toileting- Clothing Manipulation and Hygiene: Supervision/safety;Sit to/from stand Toileting - Clothing Manipulation Details (indicate cue type and reason): reviewed techniques Tub/ Shower Transfer: Tub transfer;Supervision/safety;Ambulation;Grab bars Tub/Shower Transfer Details (indicate cue type and reason): simulated in room Functional mobility during ADLs: Modified  independent General ADL Comments: educated on back precautions, brace mgmt/wear schedule and ADLs     Vision   Vision Assessment?: No apparent visual deficits     Perception     Praxis      Pertinent Vitals/Pain Pain Assessment Pain Assessment: Faces Faces  Pain Scale: Hurts a little bit Pain Location: back- incisional Pain Descriptors / Indicators: Discomfort, Operative site guarding Pain Intervention(s): Limited activity within patient's tolerance, Monitored during session, Repositioned     Hand Dominance     Extremity/Trunk Assessment Upper Extremity Assessment Upper Extremity Assessment: Overall WFL for tasks assessed   Lower Extremity Assessment Lower Extremity Assessment: Defer to PT evaluation   Cervical / Trunk Assessment Cervical / Trunk Assessment: Back Surgery   Communication Communication Communication: No difficulties   Cognition Arousal/Alertness: Awake/alert Behavior During Therapy: WFL for tasks assessed/performed Overall Cognitive Status: Within Functional Limits for tasks assessed                                       General Comments  discussed IADLs and back precautions    Exercises     Shoulder Instructions      Home Living Family/patient expects to be discharged to:: Private residence Living Arrangements: Spouse/significant other Available Help at Discharge: Family;Available 24 hours/day Type of Home: House Home Access: Stairs to enter [employer] of Steps: 4 Entrance Stairs-Rails: Can reach both Home Layout: One level     Bathroom Shower/Tub: Teacher, early years/pre: Standard     Home Equipment: Cane - single point;Shower seat;Grab bars - tub/shower;Adaptive equipment Adaptive Equipment: Reacher;Long-handled sponge        Prior Functioning/Environment Prior Level of Function : Independent/Modified Independent                        OT Problem List: Decreased activity tolerance;Decreased knowledge of use of DME or AE;Decreased knowledge of precautions;Pain      OT Treatment/Interventions:      OT Goals(Current goals can be found in the care plan section) Acute Rehab OT Goals Patient Stated Goal: home OT Goal Formulation: With patient   OT Frequency:      Co-evaluation              AM-PAC OT "6 Clicks" Daily Activity     Outcome Measure Help from another person eating meals?: None Help from another person taking care of personal grooming?: None Help from another person toileting, which includes using toliet, bedpan, or urinal?: A Little Help from another person bathing (including washing, rinsing, drying)?: A Little Help from another person to put on and taking off regular upper body clothing?: A Little Help from another person to put on and taking off regular lower body clothing?: A Little 6 Click Score: 20   End of Session Equipment Utilized During Treatment: Back brace Nurse Communication: Mobility status  Activity Tolerance: Patient tolerated treatment well Patient left: with call bell/phone within reach;Other (comment) (seated EOB)  OT Visit Diagnosis: Muscle weakness (generalized) (M62.81);Unsteadiness on feet (R26.81);Pain Pain - part of body:  (back)                Time: 9169-4503 OT Time Calculation (min): 12 min Charges:  OT General Charges $OT Visit: 1 Visit OT Evaluation $OT Eval Low Complexity: 1 Low  Jolaine Artist, OT Acute Rehabilitation Services Pager [phone] Office [phone]   Anner Crete  Kylii Ennis 06/03/2021, 9:19 AM

## 2021-07-04 ENCOUNTER — Other Ambulatory Visit: Payer: Self-pay | Admitting: Family Medicine

## 2021-07-04 DIAGNOSIS — R200 Anesthesia of skin: Secondary | ICD-10-CM

## 2021-07-13 ENCOUNTER — Ambulatory Visit
Admission: RE | Admit: 2021-07-13 | Discharge: 2021-07-13 | Disposition: A | Discharge status: Home / Self Care | Payer: Medicare HMO | Source: Ambulatory Visit | Attending: Family Medicine | Admitting: Family Medicine

## 2021-07-13 DIAGNOSIS — R200 Anesthesia of skin: Secondary | ICD-10-CM

## 2021-08-01 ENCOUNTER — Ambulatory Visit
Admission: RE | Admit: 2021-08-01 | Discharge: 2021-08-01 | Disposition: A | Discharge status: Home / Self Care | Payer: Medicare HMO | Source: Ambulatory Visit | Attending: Family Medicine | Admitting: Family Medicine

## 2021-08-01 DIAGNOSIS — I63511 Cerebral infarction due to unspecified occlusion or stenosis of right middle cerebral artery: Secondary | ICD-10-CM

## 2021-08-01 HISTORY — DX: Cerebral infarction due to unspecified occlusion or stenosis of right middle cerebral artery: I63.511

## 2021-08-14 DIAGNOSIS — I6529 Occlusion and stenosis of unspecified carotid artery: Secondary | ICD-10-CM | POA: Insufficient documentation

## 2021-08-15 ENCOUNTER — Encounter (INDEPENDENT_AMBULATORY_CARE_PROVIDER_SITE_OTHER): Payer: Self-pay | Admitting: Vascular Surgery

## 2021-08-15 ENCOUNTER — Ambulatory Visit (INDEPENDENT_AMBULATORY_CARE_PROVIDER_SITE_OTHER): Payer: Medicare HMO | Admitting: Vascular Surgery

## 2021-08-15 VITALS — BP 147/79 | HR 88 | Resp 16 | Ht 62.5 in | Wt 153.0 lb

## 2021-08-15 DIAGNOSIS — I63239 Cerebral infarction due to unspecified occlusion or stenosis of unspecified carotid arteries: Secondary | ICD-10-CM | POA: Diagnosis not present

## 2021-08-15 DIAGNOSIS — E782 Mixed hyperlipidemia: Secondary | ICD-10-CM

## 2021-08-15 DIAGNOSIS — J45909 Unspecified asthma, uncomplicated: Secondary | ICD-10-CM

## 2021-08-15 DIAGNOSIS — I10 Essential (primary) hypertension: Secondary | ICD-10-CM | POA: Diagnosis not present

## 2021-08-15 DIAGNOSIS — I6523 Occlusion and stenosis of bilateral carotid arteries: Secondary | ICD-10-CM

## 2021-08-15 NOTE — Progress Notes (Unsigned)
MRN : [id number]  Molly Mitchell is a 76 y.o. (09-04-1945) female who presents with chief complaint of check carotid arteries.  History of Present Illness: ***  Current Meds  Medication Sig   amLODipine (NORVASC) 2.5 MG tablet Take 2.5 mg by mouth in the morning and at bedtime.   amoxicillin (AMOXIL) 500 MG capsule Take 4 tablets by mouth 1 hour prior to dental procedures.   ASPIRIN LOW DOSE 81 MG tablet Take 81 mg by mouth daily.   carvedilol (COREG) 3.125 MG tablet Take 3.125 mg by mouth 2 (two) times daily with a meal.   clopidogrel (PLAVIX) 75 MG tablet Take 75 mg by mouth daily.   HYDROcodone-acetaminophen (NORCO) 10-325 MG tablet Take 1 tablet by mouth every 6 (six) hours as needed.   pravastatin (PRAVACHOL) 40 MG tablet Take 40 mg by mouth at bedtime.   telmisartan (MICARDIS) 80 MG tablet Take 80 mg by mouth daily.    Past Medical History:  Diagnosis Date   Arthritis    Cancer ([place])    skin cancer removed from nose and forehead   Connective tissue disease ([place])    pt reports being diagnosed with undifferentiated connective tissue disease at some point- pt took steroids for this and has had no issues since- 10 years ago   Heart murmur    as a child some dr say they hear it some say they don't   HLD (hyperlipidemia)    Hypertension    Neuromuscular disorder ([place])    weakness in legs when getting up   Stenosis of artery in neck ([place]) 09/07/2017    Past Surgical History:  Procedure Laterality Date   ABDOMINAL HYSTERECTOMY  1981   AIKEN OSTEOTOMY Right 01/30/2018   Procedure: WEIL RIGHT 2ND AND 3RD;  Surgeon: [name], DPM;  Location: ;  Service: Podiatry;  Laterality: Right;   BACK SURGERY     pt reports hx of 3 lumber back surgeries total   CERVICAL SPINE SURGERY     pt reports having 3 cervical fusions total   CORRECTION HAMMER TOE     HAMMER TOE SURGERY Right 01/30/2018   Procedure: HAMMER TOE CORRECTION SECOND AND THIRD;  Surgeon:  [name], DPM;  Location: [place];  Service: Podiatry;  Laterality: Right;  GENERAL WITH LOCAL   HAMMER TOE SURGERY Left 03/05/2019   Procedure: HAMMER TOE CORRECTION T1 AND T2;  Surgeon: [name], DPM;  Location: [place];  Service: Podiatry;  Laterality: Left;   JOINT REPLACEMENT Left 03/03/2013   knee   MRI     ROTATOR CUFF REPAIR Right 1981   SPINAL FUSION  2001   lower back    SPINAL FUSION  2003   neck   TOTAL KNEE REVISION Left 07/31/2016   Procedure: TOTAL KNEE REVISION;  Surgeon: Dereck Leep, MD;  Location: [facility];  Service: Orthopedics;  Laterality: Left;   WEIL OSTEOTOMY Left 03/05/2019   Procedure: WEIL OSTEOTOMY X 2 LEFT;  Surgeon: [name], DPM;  Location: [place];  Service: Podiatry;  Laterality: Left;    Social History Social History   Tobacco Use   Smoking status: Former    Types: Cigarettes    Quit date: 04/17/1979    Years since quitting: 42.3   Smokeless tobacco: Never  Vaping Use   Vaping Use: Never used  Substance Use Topics   Alcohol use: No    Alcohol/week: 0.0 standard drinks of alcohol  Drug use: No    Family History Family History  Problem Relation Age of Onset   Hypertension Brother    Hypertension Mother    Hypertension Sister    Cancer Sister    Diabetes Paternal Grandmother    Heart failure Maternal Grandmother     Allergies  Allergen Reactions   Amitriptyline Hcl Swelling    Facial swelling   Duricef [Cefadroxil] Swelling    Facial swelling   Levofloxacin Other (See Comments)    Severe Shoulder Pain   Tetanus Toxoid Swelling    Arm swelling   Tramadol Itching   Erythromycin Rash and Other (See Comments)    Rash on bottom     REVIEW OF SYSTEMS (Negative unless checked)  Constitutional: '[]'$ Weight loss  '[]'$ Fever  '[]'$ Chills Cardiac: '[]'$ Chest pain   '[]'$ Chest pressure   '[]'$ Palpitations   '[]'$ Shortness of breath when laying flat   '[]'$ Shortness of breath with exertion. Vascular:   '[x]'$ Pain in legs with walking   '[]'$ Pain in legs at rest  '[]'$ History of DVT   '[]'$ Phlebitis   '[]'$ Swelling in legs   '[]'$ Varicose veins   '[]'$ Non-healing ulcers Pulmonary:   '[]'$ Uses home oxygen   '[]'$ Productive cough   '[]'$ Hemoptysis   '[]'$ Wheeze  '[]'$ COPD   '[]'$ Asthma Neurologic:  '[]'$ Dizziness   '[]'$ Seizures   '[]'$ History of stroke   '[]'$ History of TIA  '[]'$ Aphasia   '[]'$ Vissual changes   '[]'$ Weakness or numbness in arm   '[]'$ Weakness or numbness in leg Musculoskeletal:   '[]'$ Joint swelling   '[]'$ Joint pain   '[]'$ Low back pain Hematologic:  '[]'$ Easy bruising  '[]'$ Easy bleeding   '[]'$ Hypercoagulable state   '[]'$ Anemic Gastrointestinal:  '[]'$ Diarrhea   '[]'$ Vomiting  '[]'$ Gastroesophageal reflux/heartburn   '[]'$ Difficulty swallowing. Genitourinary:  '[]'$ Chronic kidney disease   '[]'$ Difficult urination  '[]'$ Frequent urination   '[]'$ Blood in urine Skin:  '[]'$ Rashes   '[]'$ Ulcers  Psychological:  '[]'$ History of anxiety   '[]'$  History of major depression.  Physical Examination  Vitals:   08/15/21 1408  BP: (!) 147/79  Pulse: 88  Resp: 16  Weight: 153 lb (69.4 kg)  Height: 5' 2.5" (1.588 m)   Body mass index is 27.54 kg/m. Gen: WD/WN, NAD Head: [place]/AT, No temporalis wasting.  Ear/Nose/Throat: Hearing grossly intact, nares w/o erythema or drainage Eyes: PER, EOMI, sclera nonicteric.  Neck: Supple, no masses.  No bruit or JVD.  Pulmonary:  Good air movement, no audible wheezing, no use of accessory muscles.  Cardiac: RRR, normal S1, S2, no Murmurs. Vascular:  carotid bruit noted Vessel Right Left  Radial Palpable Palpable  Carotid  Palpable  Palpable  Subclav  Palpable Palpable  Gastrointestinal: soft, non-distended. No guarding/no peritoneal signs.  Musculoskeletal: M/S 5/5 throughout.  No visible deformity.  Neurologic: CN 2-12 intact. Pain and light touch intact in extremities.  Symmetrical.  Speech is fluent. Motor exam as listed above. Psychiatric: Judgment intact, Mood & affect appropriate for pt's clinical situation. Dermatologic: No rashes or ulcers noted.   No changes consistent with cellulitis.   CBC Lab Results  Component Value Date   WBC 18.9 (H) 06/03/2021   HGB 11.6 (L) 06/03/2021   HCT 33.6 (L) 06/03/2021   MCV 93.1 06/03/2021   PLT 229 06/03/2021    BMET    Component Value Date/Time   NA 135 06/03/2021 0538   NA 139 05/31/2020 1007   NA 140 03/05/2013 0449   K 4.4 06/03/2021 0538   K 3.9 03/05/2013 0449   CL 102 06/03/2021 0538   CL 107 03/05/2013 0449  CO2 24 06/03/2021 0538   CO2 29 03/05/2013 0449   GLUCOSE 178 (H) 06/03/2021 0538   GLUCOSE 94 03/05/2013 0449   BUN 15 06/03/2021 0538   BUN 25 05/31/2020 1007   BUN 28 (H) 03/05/2013 0449   CREATININE 0.92 06/03/2021 0538   CREATININE 1.02 (H) 09/26/2016 1108   CALCIUM 8.6 (L) 06/03/2021 0538   CALCIUM 8.3 (L) 03/05/2013 0449   GFRNONAA >60 06/03/2021 0538   GFRNONAA 46 (L) 03/05/2013 0449   GFRAA 71 10/23/2017 1340   GFRAA 54 (L) 03/05/2013 0449   CrCl cannot be calculated (Patient's most recent lab result is older than the maximum 21 days allowed.).  COAG Lab Results  Component Value Date   INR 0.96 07/17/2016   INR 0.9 02/19/2013    Radiology MR BRAIN WO CONTRAST  Result Date: 08/01/2021 CLINICAL DATA:  The numbness.  Hypertension. EXAM: MRI HEAD WITHOUT CONTRAST TECHNIQUE: Multiplanar, multiecho pulse sequences of the brain and surrounding structures were obtained without intravenous contrast. COMPARISON:  None FINDINGS: Brain: Diffusion imaging shows a few punctate acute infarctions in the right middle cerebral artery territory affecting the parietal cortex and the frontal cortex, consistent with micro embolic infarctions. No large confluent infarction. No swelling or hemorrhage. No focal abnormality affects the brainstem or cerebellum. There are old small vessel infarctions affecting the hemispheric white matter, mild-to-moderate in degree. No mass, hydrocephalus or extra-axial collection. Vascular: Major vessels at the base of the brain show flow. Skull  and upper cervical spine: Negative Sinuses/Orbits: Clear/normal Other: None IMPRESSION: Few punctate acute infarctions affecting the right frontal cortex and the right parietal cortex, consistent with micro embolic disease in the right carotid circulation. No large confluent infarction. No swelling or hemorrhage. Mild to moderate chronic small-vessel ischemic changes otherwise affecting the cerebral hemispheric white matter. Electronically Signed   By: Nelson Chimes M.D.   On: 08/01/2021 15:40     Assessment/Plan 1. Bilateral carotid artery stenosis ***  2. Essential (primary) hypertension ***  3. Asthmatic bronchitis without complication, unspecified asthma severity, unspecified whether persistent ***  4. Mixed hyperlipidemia ***    [name], MD  08/15/2021 2:28 PM

## 2021-08-16 ENCOUNTER — Encounter (INDEPENDENT_AMBULATORY_CARE_PROVIDER_SITE_OTHER): Payer: Self-pay | Admitting: Vascular Surgery

## 2021-08-17 ENCOUNTER — Telehealth (INDEPENDENT_AMBULATORY_CARE_PROVIDER_SITE_OTHER): Payer: Self-pay | Admitting: Vascular Surgery

## 2021-08-17 NOTE — Telephone Encounter (Signed)
Spoke with pt to advise of prior auth received for CT. I provided the number for radiology scheduling [phone] and advised pt to call ASAP and make the appt. I asked pt to call me back after making appt and schedule CT results appt with Dr. Delana Meyer where they would discuss results and plan moving forward. Nothing further needed at this time.

## 2021-08-18 ENCOUNTER — Ambulatory Visit
Admission: RE | Admit: 2021-08-18 | Discharge: 2021-08-18 | Disposition: A | Discharge status: Home / Self Care | Payer: Medicare HMO | Source: Ambulatory Visit | Attending: Vascular Surgery | Admitting: Vascular Surgery

## 2021-08-18 DIAGNOSIS — I63239 Cerebral infarction due to unspecified occlusion or stenosis of unspecified carotid arteries: Secondary | ICD-10-CM | POA: Diagnosis present

## 2021-08-18 DIAGNOSIS — I779 Disorder of arteries and arterioles, unspecified: Secondary | ICD-10-CM

## 2021-08-18 HISTORY — DX: Disorder of arteries and arterioles, unspecified: I77.9

## 2021-08-18 MED ORDER — IOHEXOL 350 MG/ML SOLN
75.0000 mL | Freq: Once | INTRAVENOUS | Status: AC | PRN
  Administered 2021-08-18: 75 mL via INTRAVENOUS

## 2021-08-19 ENCOUNTER — Ambulatory Visit: Payer: Medicare HMO

## 2021-08-22 ENCOUNTER — Encounter (INDEPENDENT_AMBULATORY_CARE_PROVIDER_SITE_OTHER): Payer: Self-pay | Admitting: Vascular Surgery

## 2021-08-22 ENCOUNTER — Ambulatory Visit (INDEPENDENT_AMBULATORY_CARE_PROVIDER_SITE_OTHER): Payer: Medicare HMO | Admitting: Vascular Surgery

## 2021-08-22 VITALS — BP 130/74 | HR 90 | Resp 16 | Wt 148.0 lb

## 2021-08-22 DIAGNOSIS — K922 Gastrointestinal hemorrhage, unspecified: Secondary | ICD-10-CM

## 2021-08-22 DIAGNOSIS — I63239 Cerebral infarction due to unspecified occlusion or stenosis of unspecified carotid arteries: Secondary | ICD-10-CM

## 2021-08-22 DIAGNOSIS — E782 Mixed hyperlipidemia: Secondary | ICD-10-CM

## 2021-08-22 DIAGNOSIS — M47812 Spondylosis without myelopathy or radiculopathy, cervical region: Secondary | ICD-10-CM | POA: Diagnosis not present

## 2021-08-22 DIAGNOSIS — J45991 Cough variant asthma: Secondary | ICD-10-CM

## 2021-08-22 DIAGNOSIS — I10 Essential (primary) hypertension: Secondary | ICD-10-CM

## 2021-08-22 NOTE — H&P (View-Only) (Signed)
MRN : [id number]  Molly Mitchell is a 76 y.o. ([birth year]) female who presents with chief complaint of check carotid arteries.  History of Present Illness:   The patient is seen for follow up evaluation of carotid stenosis status post CT angiogram. CT scan was done 08/18/2021. Patient reports that the test went well with no problems or complications.   The patient denies interval amaurosis fugax. There is no recent or interval TIA symptoms or focal motor deficits. There is no prior documented CVA.  The patient is taking enteric-coated aspirin 81 mg daily.  There is no history of migraine headaches. There is no history of seizures.  No recent shortening of the patient's walking distance or new symptoms consistent with claudication.  No history of rest pain symptoms. No new ulcers or wounds of the lower extremities have occurred.  There is no history of DVT, PE or superficial thrombophlebitis. No recent episodes of angina or shortness of breath documented.   CT angiogram is reviewed by me personally and shows >80% RICA stenosis consistent with calcified plaque and 23% LICA stenosis.    Current Meds  Medication Sig   amLODipine (NORVASC) 2.5 MG tablet Take 2.5 mg by mouth in the morning and at bedtime.   amoxicillin (AMOXIL) 500 MG capsule Take 4 tablets by mouth 1 hour prior to dental procedures.   ASPIRIN LOW DOSE 81 MG tablet Take 81 mg by mouth daily.   carvedilol (COREG) 3.125 MG tablet Take 3.125 mg by mouth 2 (two) times daily with a meal.   HYDROcodone-acetaminophen (NORCO) 10-325 MG tablet Take 1 tablet by mouth every 6 (six) hours as needed.   pravastatin (PRAVACHOL) 40 MG tablet Take 40 mg by mouth at bedtime.   telmisartan (MICARDIS) 80 MG tablet Take 80 mg by mouth daily.    Past Medical History:  Diagnosis Date   Arthritis    Cancer ([place])    skin cancer removed from nose and forehead   Connective tissue disease ([place])    pt reports being diagnosed with  undifferentiated connective tissue disease at some point- pt took steroids for this and has had no issues since- 10 years ago   Heart murmur    as a child some dr say they hear it some say they don't   HLD (hyperlipidemia)    Hypertension    Neuromuscular disorder ([place])    weakness in legs when getting up   Stenosis of artery in neck ([place]) 09/07/2017    Past Surgical History:  Procedure Laterality Date   ABDOMINAL HYSTERECTOMY  1981   AIKEN OSTEOTOMY Right 01/30/2018   Procedure: WEIL RIGHT 2ND AND 3RD;  Surgeon: [name], DPM;  Location: [place];  Service: Podiatry;  Laterality: Right;   BACK SURGERY     pt reports hx of 3 lumber back surgeries total   CERVICAL SPINE SURGERY     pt reports having 3 cervical fusions total   CORRECTION HAMMER TOE     HAMMER TOE SURGERY Right 01/30/2018   Procedure: HAMMER TOE CORRECTION SECOND AND THIRD;  Surgeon: [name], DPM;  Location: [place];  Service: Podiatry;  Laterality: Right;  GENERAL WITH LOCAL   HAMMER TOE SURGERY Left 03/05/2019   Procedure: HAMMER TOE CORRECTION T1 AND T2;  Surgeon: [name], DPM;  Location: [place];  Service: Podiatry;  Laterality: Left;   JOINT REPLACEMENT Left 03/03/2013   knee   MRI     ROTATOR CUFF  REPAIR Right 1981   SPINAL FUSION  2001   lower back    SPINAL FUSION  2003   neck   TOTAL KNEE REVISION Left 07/31/2016   Procedure: TOTAL KNEE REVISION;  Surgeon: Dereck Leep, MD;  Location: [facility];  Service: Orthopedics;  Laterality: Left;   WEIL OSTEOTOMY Left 03/05/2019   Procedure: WEIL OSTEOTOMY X 2 LEFT;  Surgeon: [name], DPM;  Location: [place];  Service: Podiatry;  Laterality: Left;    Social History Social History   Tobacco Use   Smoking status: Former    Types: Cigarettes    Quit date: 04/17/1979    Years since quitting: 42.3   Smokeless tobacco: Never  Vaping Use   Vaping Use: Never used  Substance Use Topics    Alcohol use: No    Alcohol/week: 0.0 standard drinks of alcohol   Drug use: No    Family History Family History  Problem Relation Age of Onset   Hypertension Brother    Hypertension Mother    Hypertension Sister    Cancer Sister    Diabetes Paternal Grandmother    Heart failure Maternal Grandmother     Allergies  Allergen Reactions   Amitriptyline Hcl Swelling    Facial swelling   Duricef [Cefadroxil] Swelling    Facial swelling   Levofloxacin Other (See Comments)    Severe Shoulder Pain   Tetanus Toxoid Swelling    Arm swelling   Tramadol Itching   Erythromycin Rash and Other (See Comments)    Rash on bottom     REVIEW OF SYSTEMS (Negative unless checked)  Constitutional: '[]'$ Weight loss  '[]'$ Fever  '[]'$ Chills Cardiac: '[]'$ Chest pain   '[]'$ Chest pressure   '[]'$ Palpitations   '[]'$ Shortness of breath when laying flat   '[]'$ Shortness of breath with exertion. Vascular:  '[x]'$ Pain in legs with walking   '[]'$ Pain in legs at rest  '[]'$ History of DVT   '[]'$ Phlebitis   '[]'$ Swelling in legs   '[]'$ Varicose veins   '[]'$ Non-healing ulcers Pulmonary:   '[]'$ Uses home oxygen   '[]'$ Productive cough   '[]'$ Hemoptysis   '[]'$ Wheeze  '[]'$ COPD   '[]'$ Asthma Neurologic:  '[]'$ Dizziness   '[]'$ Seizures   '[x]'$ History of stroke   '[x]'$ History of TIA  '[]'$ Aphasia   '[]'$ Vissual changes   '[]'$ Weakness or numbness in arm   '[]'$ Weakness or numbness in leg Musculoskeletal:   '[]'$ Joint swelling   '[]'$ Joint pain   '[]'$ Low back pain Hematologic:  '[]'$ Easy bruising  '[]'$ Easy bleeding   '[]'$ Hypercoagulable state   '[]'$ Anemic Gastrointestinal:  '[]'$ Diarrhea   '[]'$ Vomiting  '[]'$ Gastroesophageal reflux/heartburn   '[]'$ Difficulty swallowing. Genitourinary:  '[]'$ Chronic kidney disease   '[]'$ Difficult urination  '[]'$ Frequent urination   '[]'$ Blood in urine Skin:  '[]'$ Rashes   '[]'$ Ulcers  Psychological:  '[]'$ History of anxiety   '[]'$  History of major depression.  Physical Examination  Vitals:   08/22/21 1324  BP: 130/74  Pulse: 90  Resp: 16  Weight: 148 lb (67.1 kg)   Body mass index is 26.64  kg/m. Gen: WD/WN, NAD Head: [place]/AT, No temporalis wasting.  Ear/Nose/Throat: Hearing grossly intact, nares w/o erythema or drainage Eyes: PER, EOMI, sclera nonicteric.  Neck: Supple, no masses.  No bruit or JVD.  Pulmonary:  Good air movement, no audible wheezing, no use of accessory muscles.  Cardiac: RRR, normal S1, S2, no Murmurs. Vascular:  carotid bruit noted bilaterally Vessel Right Left  Radial Palpable Palpable  Carotid  Palpable  Palpable  Subclav  Palpable Palpable  Gastrointestinal: soft, non-distended. No guarding/no peritoneal signs.  Musculoskeletal:  M/S 5/5 throughout.  No visible deformity.  Neurologic: CN 2-12 intact. Pain and light touch intact in extremities.  Symmetrical.  Speech is fluent. Motor exam as listed above. Psychiatric: Judgment intact, Mood & affect appropriate for pt's clinical situation. Dermatologic: No rashes or ulcers noted.  No changes consistent with cellulitis.   CBC Lab Results  Component Value Date   WBC 18.9 (H) 06/03/2021   HGB 11.6 (L) 06/03/2021   HCT 33.6 (L) 06/03/2021   MCV 93.1 06/03/2021   PLT 229 06/03/2021    BMET    Component Value Date/Time   NA 135 06/03/2021 0538   NA 139 05/31/2020 1007   NA 140 03/05/2013 0449   K 4.4 06/03/2021 0538   K 3.9 03/05/2013 0449   CL 102 06/03/2021 0538   CL 107 03/05/2013 0449   CO2 24 06/03/2021 0538   CO2 29 03/05/2013 0449   GLUCOSE 178 (H) 06/03/2021 0538   GLUCOSE 94 03/05/2013 0449   BUN 15 06/03/2021 0538   BUN 25 05/31/2020 1007   BUN 28 (H) 03/05/2013 0449   CREATININE 0.92 06/03/2021 0538   CREATININE 1.02 (H) 09/26/2016 1108   CALCIUM 8.6 (L) 06/03/2021 0538   CALCIUM 8.3 (L) 03/05/2013 0449   GFRNONAA >60 06/03/2021 0538   GFRNONAA 46 (L) 03/05/2013 0449   GFRAA 71 10/23/2017 1340   GFRAA 54 (L) 03/05/2013 0449   CrCl cannot be calculated (Patient's most recent lab result is older than the maximum 21 days allowed.).  COAG Lab Results  Component Value Date    INR 0.96 07/17/2016   INR 0.9 02/19/2013    Radiology CT ANGIO NECK W OR WO CONTRAST  Result Date: 08/18/2021 CLINICAL DATA:  Carotid artery stenosis EXAM: CT ANGIOGRAPHY NECK TECHNIQUE: Multidetector CT imaging of the neck was performed using the standard protocol during bolus administration of intravenous contrast. Multiplanar CT image reconstructions and MIPs were obtained to evaluate the vascular anatomy. Carotid stenosis measurements (when applicable) are obtained utilizing NASCET criteria, using the distal internal carotid diameter as the denominator. RADIATION DOSE REDUCTION: This exam was performed according to the departmental dose-optimization program which includes automated exposure control, adjustment of the mA and/or kV according to patient size and/or use of iterative reconstruction technique. CONTRAST:  97m OMNIPAQUE IOHEXOL 350 MG/ML SOLN COMPARISON:  Carotid ultrasound 08/09/2021. FINDINGS: Aortic arch: Great vessel origins are patent. Right carotid system: Atherosclerosis involving the carotid bifurcation and proximal ICA with approximately 70-80% stenosis of the proximal ICA. Left carotid system: Atherosclerosis at the carotid bifurcation with approximately 40% stenosis. Vertebral arteries: Left dominant. Bilateral vertebral arteries are patent without significant (greater than 50%) stenosis. Skeleton: Multilevel degenerative change. Multilevel facet and uncovertebral hypertrophy contributes to increased neural foraminal stenosis. C4-C5 ACDF with solid bony fusion. Also, bony fusion across C5-C6. Other neck: No acute findings. Upper chest: Visualized lung apices are clear. IMPRESSION: Approximately 70-80% right and 40% left proximal ICA stenosis. Electronically Signed   By: FMargaretha SheffieldM.D.   On: 08/18/2021 13:07   MR BRAIN WO CONTRAST  Result Date: 08/01/2021 CLINICAL DATA:  The numbness.  Hypertension. EXAM: MRI HEAD WITHOUT CONTRAST TECHNIQUE: Multiplanar, multiecho pulse  sequences of the brain and surrounding structures were obtained without intravenous contrast. COMPARISON:  None FINDINGS: Brain: Diffusion imaging shows a few punctate acute infarctions in the right middle cerebral artery territory affecting the parietal cortex and the frontal cortex, consistent with micro embolic infarctions. No large confluent infarction. No swelling or hemorrhage. No focal abnormality affects  the brainstem or cerebellum. There are old small vessel infarctions affecting the hemispheric white matter, mild-to-moderate in degree. No mass, hydrocephalus or extra-axial collection. Vascular: Major vessels at the base of the brain show flow. Skull and upper cervical spine: Negative Sinuses/Orbits: Clear/normal Other: None IMPRESSION: Few punctate acute infarctions affecting the right frontal cortex and the right parietal cortex, consistent with micro embolic disease in the right carotid circulation. No large confluent infarction. No swelling or hemorrhage. Mild to moderate chronic small-vessel ischemic changes otherwise affecting the cerebral hemispheric white matter. Electronically Signed   By: Nelson Chimes M.D.   On: 08/01/2021 15:40     Assessment/Plan 1. Symptomatic carotid artery stenosis with infarction [facility]) Recommend:  The patient remains asymptomatic with respect to the carotid stenosis.  However, the patient has now progressed and has a right side lesion that is >80%.  Patient's CT angiography of the carotid arteries confirms >80% right ICA stenosis.  The anatomical considerations support surgery over stenting.  However, the patient was started on Plavix and 81 mg of aspirin at her last visit.  She developed black starry tarry stools consistent with GI bleed and therefore stopped both.  The symptoms resolved and she has restarted her baby aspirin without evidence for further bleeding.  Given this finding stenting is contraindicated because of the inability to maintain dual antiplatelet  therapy.  We will therefore move forward with endarterectomy in spite of her high bifurcation and degenerative neck changes.  This was discussed in detail with the patient.  The patient does indeed need surgery, therefore, cardiac clearance will be arranged.  She has been seen by Dr. Nehemiah Massed in the past.  Once cleared the patient will be scheduled for surgery.  The risks, benefits and alternative therapies were reviewed in detail with the patient.  All questions were answered.  The patient agrees to proceed with surgery of the right carotid artery.  Continue antiplatelet therapy as prescribed. Continue management of CAD, HTN and Hyperlipidemia. Healthy heart diet, encouraged exercise at least 4 times per week.    2. Gastrointestinal hemorrhage, unspecified gastrointestinal hemorrhage type As noted above the patient developed symptoms consistent with GI bleed on dual antiplatelet therapy.  She is discontinued the Plavix and the symptom has resolved.  Consequently, we will move forward with surgery rather than stenting  3. Osteoarthritis of cervical spine, unspecified spinal osteoarthritis complication status Continue NSAID medications as already ordered, these medications have been reviewed and there are no changes at this time.  Continued activity and therapy was stressed.   4. Essential (primary) hypertension Continue antihypertensive medications as already ordered, these medications have been reviewed and there are no changes at this time.   5. Asthma, cough variant Continue pulmonary medications and aerosols as already ordered, these medications have been reviewed and there are no changes at this time.    6. Mixed hyperlipidemia Continue statin as ordered and reviewed, no changes at this time     [name], MD  08/22/2021 1:40 PM

## 2021-08-22 NOTE — Progress Notes (Signed)
MRN : [id number]  Molly Mitchell is a 76 y.o. (January 20, 1945) female who presents with chief complaint of check carotid arteries.  History of Present Illness:   The patient is seen for follow up evaluation of carotid stenosis status post CT angiogram. CT scan was done 08/18/2021. Patient reports that the test went well with no problems or complications.   The patient denies interval amaurosis fugax. There is no recent or interval TIA symptoms or focal motor deficits. There is no prior documented CVA.  The patient is taking enteric-coated aspirin 81 mg daily.  There is no history of migraine headaches. There is no history of seizures.  No recent shortening of the patient's walking distance or new symptoms consistent with claudication.  No history of rest pain symptoms. No new ulcers or wounds of the lower extremities have occurred.  There is no history of DVT, PE or superficial thrombophlebitis. No recent episodes of angina or shortness of breath documented.   CT angiogram is reviewed by me personally and shows >80% RICA stenosis consistent with calcified plaque and 88% LICA stenosis.    Current Meds  Medication Sig   amLODipine (NORVASC) 2.5 MG tablet Take 2.5 mg by mouth in the morning and at bedtime.   amoxicillin (AMOXIL) 500 MG capsule Take 4 tablets by mouth 1 hour prior to dental procedures.   ASPIRIN LOW DOSE 81 MG tablet Take 81 mg by mouth daily.   carvedilol (COREG) 3.125 MG tablet Take 3.125 mg by mouth 2 (two) times daily with a meal.   HYDROcodone-acetaminophen (NORCO) 10-325 MG tablet Take 1 tablet by mouth every 6 (six) hours as needed.   pravastatin (PRAVACHOL) 40 MG tablet Take 40 mg by mouth at bedtime.   telmisartan (MICARDIS) 80 MG tablet Take 80 mg by mouth daily.    Past Medical History:  Diagnosis Date   Arthritis    Cancer ([place])    skin cancer removed from nose and forehead   Connective tissue disease ([place])    pt reports being diagnosed with  undifferentiated connective tissue disease at some point- pt took steroids for this and has had no issues since- 10 years ago   Heart murmur    as a child some dr say they hear it some say they don't   HLD (hyperlipidemia)    Hypertension    Neuromuscular disorder ([place])    weakness in legs when getting up   Stenosis of artery in neck ([place]) 09/07/2017    Past Surgical History:  Procedure Laterality Date   ABDOMINAL HYSTERECTOMY  1981   AIKEN OSTEOTOMY Right 01/30/2018   Procedure: WEIL RIGHT 2ND AND 3RD;  Surgeon: [name], DPM;  Location: [facility];  Service: Podiatry;  Laterality: Right;   BACK SURGERY     pt reports hx of 3 lumber back surgeries total   CERVICAL SPINE SURGERY     pt reports having 3 cervical fusions total   CORRECTION HAMMER TOE     HAMMER TOE SURGERY Right 01/30/2018   Procedure: HAMMER TOE CORRECTION SECOND AND THIRD;  Surgeon: [name], DPM;  Location: [facility];  Service: Podiatry;  Laterality: Right;  GENERAL WITH LOCAL   HAMMER TOE SURGERY Left 03/05/2019   Procedure: HAMMER TOE CORRECTION T1 AND T2;  Surgeon: [name], DPM;  Location: [facility];  Service: Podiatry;  Laterality: Left;   JOINT REPLACEMENT Left 03/03/2013   knee   MRI     ROTATOR CUFF  REPAIR Right 1981   SPINAL FUSION  2001   lower back    SPINAL FUSION  2003   neck   TOTAL KNEE REVISION Left 07/31/2016   Procedure: TOTAL KNEE REVISION;  Surgeon: Dereck Leep, MD;  Location: [facility];  Service: Orthopedics;  Laterality: Left;   WEIL OSTEOTOMY Left 03/05/2019   Procedure: WEIL OSTEOTOMY X 2 LEFT;  Surgeon: [name], DPM;  Location: [facility];  Service: Podiatry;  Laterality: Left;    Social History Social History   Tobacco Use   Smoking status: Former    Types: Cigarettes    Quit date: 04/17/1979    Years since quitting: 42.3   Smokeless tobacco: Never  Vaping Use   Vaping Use: Never used  Substance Use Topics    Alcohol use: No    Alcohol/week: 0.0 standard drinks of alcohol   Drug use: No    Family History Family History  Problem Relation Age of Onset   Hypertension Brother    Hypertension Mother    Hypertension Sister    Cancer Sister    Diabetes Paternal Grandmother    Heart failure Maternal Grandmother     Allergies  Allergen Reactions   Amitriptyline Hcl Swelling    Facial swelling   Duricef [Cefadroxil] Swelling    Facial swelling   Levofloxacin Other (See Comments)    Severe Shoulder Pain   Tetanus Toxoid Swelling    Arm swelling   Tramadol Itching   Erythromycin Rash and Other (See Comments)    Rash on bottom     REVIEW OF SYSTEMS (Negative unless checked)  Constitutional: '[]'$ Weight loss  '[]'$ Fever  '[]'$ Chills Cardiac: '[]'$ Chest pain   '[]'$ Chest pressure   '[]'$ Palpitations   '[]'$ Shortness of breath when laying flat   '[]'$ Shortness of breath with exertion. Vascular:  '[x]'$ Pain in legs with walking   '[]'$ Pain in legs at rest  '[]'$ History of DVT   '[]'$ Phlebitis   '[]'$ Swelling in legs   '[]'$ Varicose veins   '[]'$ Non-healing ulcers Pulmonary:   '[]'$ Uses home oxygen   '[]'$ Productive cough   '[]'$ Hemoptysis   '[]'$ Wheeze  '[]'$ COPD   '[]'$ Asthma Neurologic:  '[]'$ Dizziness   '[]'$ Seizures   '[x]'$ History of stroke   '[x]'$ History of TIA  '[]'$ Aphasia   '[]'$ Vissual changes   '[]'$ Weakness or numbness in arm   '[]'$ Weakness or numbness in leg Musculoskeletal:   '[]'$ Joint swelling   '[]'$ Joint pain   '[]'$ Low back pain Hematologic:  '[]'$ Easy bruising  '[]'$ Easy bleeding   '[]'$ Hypercoagulable state   '[]'$ Anemic Gastrointestinal:  '[]'$ Diarrhea   '[]'$ Vomiting  '[]'$ Gastroesophageal reflux/heartburn   '[]'$ Difficulty swallowing. Genitourinary:  '[]'$ Chronic kidney disease   '[]'$ Difficult urination  '[]'$ Frequent urination   '[]'$ Blood in urine Skin:  '[]'$ Rashes   '[]'$ Ulcers  Psychological:  '[]'$ History of anxiety   '[]'$  History of major depression.  Physical Examination  Vitals:   08/22/21 1324  BP: 130/74  Pulse: 90  Resp: 16  Weight: 148 lb (67.1 kg)   Body mass index is 26.64  kg/m. Gen: WD/WN, NAD Head: [place]/AT, No temporalis wasting.  Ear/Nose/Throat: Hearing grossly intact, nares w/o erythema or drainage Eyes: PER, EOMI, sclera nonicteric.  Neck: Supple, no masses.  No bruit or JVD.  Pulmonary:  Good air movement, no audible wheezing, no use of accessory muscles.  Cardiac: RRR, normal S1, S2, no Murmurs. Vascular:  carotid bruit noted bilaterally Vessel Right Left  Radial Palpable Palpable  Carotid  Palpable  Palpable  Subclav  Palpable Palpable  Gastrointestinal: soft, non-distended. No guarding/no peritoneal signs.  Musculoskeletal:  M/S 5/5 throughout.  No visible deformity.  Neurologic: CN 2-12 intact. Pain and light touch intact in extremities.  Symmetrical.  Speech is fluent. Motor exam as listed above. Psychiatric: Judgment intact, Mood & affect appropriate for pt's clinical situation. Dermatologic: No rashes or ulcers noted.  No changes consistent with cellulitis.   CBC Lab Results  Component Value Date   WBC 18.9 (H) 06/03/2021   HGB 11.6 (L) 06/03/2021   HCT 33.6 (L) 06/03/2021   MCV 93.1 06/03/2021   PLT 229 06/03/2021    BMET    Component Value Date/Time   NA 135 06/03/2021 0538   NA 139 05/31/2020 1007   NA 140 03/05/2013 0449   K 4.4 06/03/2021 0538   K 3.9 03/05/2013 0449   CL 102 06/03/2021 0538   CL 107 03/05/2013 0449   CO2 24 06/03/2021 0538   CO2 29 03/05/2013 0449   GLUCOSE 178 (H) 06/03/2021 0538   GLUCOSE 94 03/05/2013 0449   BUN 15 06/03/2021 0538   BUN 25 05/31/2020 1007   BUN 28 (H) 03/05/2013 0449   CREATININE 0.92 06/03/2021 0538   CREATININE 1.02 (H) 09/26/2016 1108   CALCIUM 8.6 (L) 06/03/2021 0538   CALCIUM 8.3 (L) 03/05/2013 0449   GFRNONAA >60 06/03/2021 0538   GFRNONAA 46 (L) 03/05/2013 0449   GFRAA 71 10/23/2017 1340   GFRAA 54 (L) 03/05/2013 0449   CrCl cannot be calculated (Patient's most recent lab result is older than the maximum 21 days allowed.).  COAG Lab Results  Component Value Date    INR 0.96 07/17/2016   INR 0.9 02/19/2013    Radiology CT ANGIO NECK W OR WO CONTRAST  Result Date: 08/18/2021 CLINICAL DATA:  Carotid artery stenosis EXAM: CT ANGIOGRAPHY NECK TECHNIQUE: Multidetector CT imaging of the neck was performed using the standard protocol during bolus administration of intravenous contrast. Multiplanar CT image reconstructions and MIPs were obtained to evaluate the vascular anatomy. Carotid stenosis measurements (when applicable) are obtained utilizing NASCET criteria, using the distal internal carotid diameter as the denominator. RADIATION DOSE REDUCTION: This exam was performed according to the departmental dose-optimization program which includes automated exposure control, adjustment of the mA and/or kV according to patient size and/or use of iterative reconstruction technique. CONTRAST:  54m OMNIPAQUE IOHEXOL 350 MG/ML SOLN COMPARISON:  Carotid ultrasound 08/09/2021. FINDINGS: Aortic arch: Great vessel origins are patent. Right carotid system: Atherosclerosis involving the carotid bifurcation and proximal ICA with approximately 70-80% stenosis of the proximal ICA. Left carotid system: Atherosclerosis at the carotid bifurcation with approximately 40% stenosis. Vertebral arteries: Left dominant. Bilateral vertebral arteries are patent without significant (greater than 50%) stenosis. Skeleton: Multilevel degenerative change. Multilevel facet and uncovertebral hypertrophy contributes to increased neural foraminal stenosis. C4-C5 ACDF with solid bony fusion. Also, bony fusion across C5-C6. Other neck: No acute findings. Upper chest: Visualized lung apices are clear. IMPRESSION: Approximately 70-80% right and 40% left proximal ICA stenosis. Electronically Signed   By: FMargaretha SheffieldM.D.   On: 08/18/2021 13:07   MR BRAIN WO CONTRAST  Result Date: 08/01/2021 CLINICAL DATA:  The numbness.  Hypertension. EXAM: MRI HEAD WITHOUT CONTRAST TECHNIQUE: Multiplanar, multiecho pulse  sequences of the brain and surrounding structures were obtained without intravenous contrast. COMPARISON:  None FINDINGS: Brain: Diffusion imaging shows a few punctate acute infarctions in the right middle cerebral artery territory affecting the parietal cortex and the frontal cortex, consistent with micro embolic infarctions. No large confluent infarction. No swelling or hemorrhage. No focal abnormality affects  the brainstem or cerebellum. There are old small vessel infarctions affecting the hemispheric white matter, mild-to-moderate in degree. No mass, hydrocephalus or extra-axial collection. Vascular: Major vessels at the base of the brain show flow. Skull and upper cervical spine: Negative Sinuses/Orbits: Clear/normal Other: None IMPRESSION: Few punctate acute infarctions affecting the right frontal cortex and the right parietal cortex, consistent with micro embolic disease in the right carotid circulation. No large confluent infarction. No swelling or hemorrhage. Mild to moderate chronic small-vessel ischemic changes otherwise affecting the cerebral hemispheric white matter. Electronically Signed   By: Nelson Chimes M.D.   On: 08/01/2021 15:40     Assessment/Plan 1. Symptomatic carotid artery stenosis with infarction [facility]) Recommend:  The patient remains asymptomatic with respect to the carotid stenosis.  However, the patient has now progressed and has a right side lesion that is >80%.  Patient's CT angiography of the carotid arteries confirms >80% right ICA stenosis.  The anatomical considerations support surgery over stenting.  However, the patient was started on Plavix and 81 mg of aspirin at her last visit.  She developed black starry tarry stools consistent with GI bleed and therefore stopped both.  The symptoms resolved and she has restarted her baby aspirin without evidence for further bleeding.  Given this finding stenting is contraindicated because of the inability to maintain dual antiplatelet  therapy.  We will therefore move forward with endarterectomy in spite of her high bifurcation and degenerative neck changes.  This was discussed in detail with the patient.  The patient does indeed need surgery, therefore, cardiac clearance will be arranged.  She has been seen by Dr. Nehemiah Massed in the past.  Once cleared the patient will be scheduled for surgery.  The risks, benefits and alternative therapies were reviewed in detail with the patient.  All questions were answered.  The patient agrees to proceed with surgery of the right carotid artery.  Continue antiplatelet therapy as prescribed. Continue management of CAD, HTN and Hyperlipidemia. Healthy heart diet, encouraged exercise at least 4 times per week.    2. Gastrointestinal hemorrhage, unspecified gastrointestinal hemorrhage type As noted above the patient developed symptoms consistent with GI bleed on dual antiplatelet therapy.  She is discontinued the Plavix and the symptom has resolved.  Consequently, we will move forward with surgery rather than stenting  3. Osteoarthritis of cervical spine, unspecified spinal osteoarthritis complication status Continue NSAID medications as already ordered, these medications have been reviewed and there are no changes at this time.  Continued activity and therapy was stressed.   4. Essential (primary) hypertension Continue antihypertensive medications as already ordered, these medications have been reviewed and there are no changes at this time.   5. Asthma, cough variant Continue pulmonary medications and aerosols as already ordered, these medications have been reviewed and there are no changes at this time.    6. Mixed hyperlipidemia Continue statin as ordered and reviewed, no changes at this time     [name], MD  08/22/2021 1:40 PM

## 2021-08-23 ENCOUNTER — Encounter (INDEPENDENT_AMBULATORY_CARE_PROVIDER_SITE_OTHER): Payer: Self-pay | Admitting: Vascular Surgery

## 2021-08-23 DIAGNOSIS — M47812 Spondylosis without myelopathy or radiculopathy, cervical region: Secondary | ICD-10-CM | POA: Insufficient documentation

## 2021-08-23 DIAGNOSIS — K922 Gastrointestinal hemorrhage, unspecified: Secondary | ICD-10-CM | POA: Insufficient documentation

## 2021-08-30 ENCOUNTER — Telehealth (INDEPENDENT_AMBULATORY_CARE_PROVIDER_SITE_OTHER): Payer: Self-pay

## 2021-08-30 NOTE — Telephone Encounter (Signed)
Spoke with the patient and she is scheduled with Dr. Delana Meyer on 09/21/21 for a right carotid endarterectomy at the [facility]. Pre-surgical instructions were  discussed and will be mailed. Pre-op phone call is on 09/12/21 between 1-5 pm.

## 2021-09-12 ENCOUNTER — Encounter
Admission: RE | Admit: 2021-09-12 | Discharge: 2021-09-12 | Disposition: A | Discharge status: Home / Self Care | Payer: Medicare HMO | Source: Ambulatory Visit | Attending: Vascular Surgery | Admitting: Vascular Surgery

## 2021-09-12 ENCOUNTER — Other Ambulatory Visit (INDEPENDENT_AMBULATORY_CARE_PROVIDER_SITE_OTHER): Payer: Self-pay | Admitting: Nurse Practitioner

## 2021-09-12 ENCOUNTER — Other Ambulatory Visit: Payer: Self-pay

## 2021-09-12 DIAGNOSIS — I63239 Cerebral infarction due to unspecified occlusion or stenosis of unspecified carotid arteries: Secondary | ICD-10-CM

## 2021-09-12 DIAGNOSIS — Z01812 Encounter for preprocedural laboratory examination: Secondary | ICD-10-CM

## 2021-09-12 HISTORY — DX: Chronic kidney disease, unspecified: N18.9

## 2021-09-12 HISTORY — DX: Atherosclerotic heart disease of native coronary artery without angina pectoris: I25.10

## 2021-09-12 HISTORY — DX: Anemia, unspecified: D64.9

## 2021-09-12 NOTE — Patient Instructions (Addendum)
Your procedure is scheduled on: 09/21/21 - Wednesday Report to the Registration Desk on the 1st floor of the [facility]. To find out your arrival time, please call [phone] between 1PM - 3PM on: 09/20/21 - Tuesday If your arrival time is 6:00 am, do not arrive prior to that time as the [facility] entrance doors do not open until 6:00 am. Report to [facility] at [place] , [address] for Labs/bag on 09/13/21 at 1030 am.  REMEMBER: Instructions that are not followed completely may result in serious medical risk, up to and including death; or upon the discretion of your surgeon and anesthesiologist your surgery may need to be rescheduled.  Do not eat food or drink any fluids after midnight the night before surgery.  No gum chewing, lozengers or hard candies.  TAKE THESE MEDICATIONS THE MORNING OF SURGERY WITH A SIP OF WATER:  - amLODipine (NORVASC)  - carvedilol (COREG)   One week prior to surgery: Stop Anti-inflammatories (NSAIDS) such as Advil, Aleve, Ibuprofen, Motrin, Naproxen, Naprosyn and Aspirin based products such as Excedrin, Goodys Powder, BC Powder.  Stop ANY OVER THE COUNTER supplements until after surgery.  You may however, continue to take Tylenol if needed for pain up until the day of surgery.  No Alcohol for 24 hours before or after surgery.  No Smoking including e-cigarettes for 24 hours prior to surgery.  No chewable tobacco products for at least 6 hours prior to surgery.  No nicotine patches on the day of surgery.  Do not use any "recreational" drugs for at least a week prior to your surgery.  Please be advised that the combination of cocaine and anesthesia may have negative outcomes, up to and including death. If you test positive for cocaine, your surgery will be cancelled.  On the morning of surgery brush your teeth with toothpaste and water, you may rinse your mouth with mouthwash if you wish. Do not swallow any  toothpaste or mouthwash.  Use CHG Soap or wipes as directed on instruction sheet.  Do not wear jewelry, make-up, hairpins, clips or nail polish.  Do not wear lotions, powders, or perfumes.   Do not shave body from the neck down 48 hours prior to surgery just in case you cut yourself which could leave a site for infection.  Also, freshly shaved skin may become irritated if using the CHG soap.  Contact lenses, hearing aids and dentures may not be worn into surgery.  Do not bring valuables to the hospital. [facility] is not responsible for any missing/lost belongings or valuables.   Notify your doctor if there is any change in your medical condition (cold, fever, infection).  Wear comfortable clothing (specific to your surgery type) to the hospital.  After surgery, you can help prevent lung complications by doing breathing exercises.  Take deep breaths and cough every 1-2 hours. Your doctor may order a device called an Incentive Spirometer to help you take deep breaths. When coughing or sneezing, hold a pillow firmly against your incision with both hands. This is called "splinting." Doing this helps protect your incision. It also decreases belly discomfort.  If you are being admitted to the hospital overnight, leave your suitcase in the car. After surgery it may be brought to your room.  If you are being discharged the day of surgery, you will not be allowed to drive home. You will need a responsible adult (18 years or older) to drive you home  and stay with you that night.   If you are taking public transportation, you will need to have a responsible adult (18 years or older) with you. Please confirm with your physician that it is acceptable to use public transportation.   Please call the [facility] Dept. at [phone] if you have any questions about these instructions.  Surgery Visitation Policy:  Patients undergoing a surgery or procedure may have two family  members or support persons with them as long as the person is not COVID-19 positive or experiencing its symptoms.   Inpatient Visitation:    Visiting hours are 7 a.m. to 8 p.m. Up to four visitors are allowed at one time in a patient room, including children. The visitors may rotate out with other people during the day. One designated support person (adult) may remain overnight.

## 2021-09-13 ENCOUNTER — Encounter
Admission: RE | Admit: 2021-09-13 | Discharge: 2021-09-13 | Disposition: A | Discharge status: Home / Self Care | Payer: Medicare HMO | Source: Ambulatory Visit | Attending: Vascular Surgery | Admitting: Vascular Surgery

## 2021-09-13 ENCOUNTER — Encounter: Payer: Self-pay | Admitting: Vascular Surgery

## 2021-09-13 ENCOUNTER — Encounter: Payer: Self-pay | Admitting: Urgent Care

## 2021-09-13 DIAGNOSIS — Z01812 Encounter for preprocedural laboratory examination: Secondary | ICD-10-CM | POA: Diagnosis present

## 2021-09-13 DIAGNOSIS — I340 Nonrheumatic mitral (valve) insufficiency: Secondary | ICD-10-CM | POA: Diagnosis not present

## 2021-09-13 DIAGNOSIS — I10 Essential (primary) hypertension: Secondary | ICD-10-CM | POA: Insufficient documentation

## 2021-09-13 LAB — CBC
HCT: 37.9 % (ref 36.0–46.0)
Hemoglobin: 12.1 g/dL (ref 12.0–15.0)
MCH: 28.9 pg (ref 26.0–34.0)
MCHC: 31.9 g/dL (ref 30.0–36.0)
MCV: 90.7 fL (ref 80.0–100.0)
Platelets: 291 10*3/uL (ref 150–400)
RBC: 4.18 MIL/uL (ref 3.87–5.11)
RDW: 15 % (ref 11.5–15.5)
WBC: 6.8 10*3/uL (ref 4.0–10.5)
nRBC: 0 % (ref 0.0–0.2)

## 2021-09-13 LAB — BASIC METABOLIC PANEL
Anion gap: 6 (ref 5–15)
BUN: 27 mg/dL — ABNORMAL HIGH (ref 8–23)
CO2: 27 mmol/L (ref 22–32)
Calcium: 9.5 mg/dL (ref 8.9–10.3)
Chloride: 108 mmol/L (ref 98–111)
Creatinine, Ser: 0.92 mg/dL (ref 0.44–1.00)
GFR, Estimated: >60 mL/min (ref >60)
Glucose, Bld: 106 mg/dL — ABNORMAL HIGH (ref 70–99)
Potassium: 4.6 mmol/L (ref 3.5–5.1)
Sodium: 141 mmol/L (ref 135–145)

## 2021-09-13 LAB — TYPE AND SCREEN
ABO/RH(D): A POS
Antibody Screen: NEGATIVE

## 2021-09-13 NOTE — Progress Notes (Signed)
Perioperative Services  Pre-Admission/Anesthesia Testing Clinical Review  Date: 09/16/21  Patient Demographics:  Name: Molly Mitchell DOB:   [birth year] MRN:   [id number]  Planned Surgical Procedure(s):    Case: [id number] Date/Time: 09/21/21 0815   Procedure: ENDARTERECTOMY CAROTID (Right)   Anesthesia type: General   Pre-op diagnosis: CAROTID STENOSIS   Location: [facility]   Surgeons: Katha Cabal, MD   NOTE: Available PAT nursing documentation and vital signs have been reviewed. Clinical nursing staff has updated patient's PMH/PSHx, current medication list, and drug allergies/intolerances to ensure comprehensive history available to assist in medical decision making as it pertains to the aforementioned surgical procedure and anticipated anesthetic course. Extensive review of available clinical information performed. [place] PMH and PSHx updated with any diagnoses/procedures that  may have been inadvertently omitted during her intake with the pre-admission testing department's nursing staff.  Clinical Discussion:  Molly Mitchell is a 76 y.o. female who is submitted for pre-surgical anesthesia review and clearance prior to her undergoing the above procedure. Patient is a Former Smoker (15 pack years; quit 04/1979). Pertinent PMH includes: CAD, BILATERAL carotid artery disease, acute RIGHT MCA CVA, childhood cardiac murmur, HTN, HLD, CKD, anemia, cervical DDD (s/p ACDF), lumbar DDD (s/p laminectomy and posterior fusion), OA, lower extremity weakness, chronic pain syndrome (on COT).  Patient is followed by cardiology Nehemiah Massed, MD). She was last seen in the cardiology clinic on 05/31/2021; notes reviewed.  At the time of her clinic visit, patient doing well overall from a cardiovascular perspective.  She denied any episodes of chest pain, shortness breath, PND, orthopnea, palpitations, significant peripheral edema, vertiginous symptoms, or  presyncope/syncope.  Patient with a past medical history significant for cardiovascular diagnoses.  MRI imaging of the brain performed on 08/01/2021 revealed punctate acute RIGHT MCA territory infarct affecting the frontal and parietal cortex consistent with microembolic infarcts in the RIGHT carotid circulation.  CTA of the neck performed on 08/18/2021 revealed a 66-06% RICA and a 30% LICA stenosis  Last TTE performed on 03/12/2020 revealed a mildly reduced left ventricular systolic function with an EF of 45%.  There was mild distal anterior and apical hypokinesis.  Mild mitral and tricuspid valve regurgitation noted.  There was no evidence of a significant transvalvular gradient to suggest stenosis.  Given vascular disease, patient is on daily antiplatelet therapy using clopidogrel.  She is reported to be compliant with therapy with no evidence or reports of GI bleeding.  Blood pressure well controlled at 120/80 mmHg on currently prescribed CCB (amlodipine), beta-blocker (carvedilol), and ARB (telmisartan) therapies.  Patient is on pravastatin for her HLD diagnosis and further ASCVD prevention.  She is not diabetic.  Patient does not have an OSAH diagnosis.  Functional capacity limited by arthritides, back pain, and multiple medical comorbidities.  Per cardiology, patient not felt to be able to achieve 4 METS of activity without experiencing angina/anginal equivalent symptoms.  No changes were made to her medication regimen.  Patient to follow-up with outpatient cardiology in 1 year or sooner if needed.  AASHI DERRINGTON is scheduled for a RIGHT CAROTID ENDARTERECTOMY on 09/21/2021 with Dr. [name], MD. Given patient's past medical history significant for cardiovascular diagnoses, presurgical cardiac clearance was sought by the performing surgeon's office and PAT team. Per cardiology, "this patient is optimized for surgery and may proceed with the planned procedural course with a ACCEPTABLE risk of  significant perioperative cardiovascular complications".  In review of his  medication reconciliation, it is noted that patient is on daily antiplatelet therapy.  Per standing orders for this procedure from Dr. Delana Meyer, patient may continue his daily low-dose ASA throughout the perioperative course.  Patient denies previous perioperative complications with anesthesia in the past. In review of the available records, it is noted that patient underwent a general anesthetic course at [facility] ([facility] II) in 05/2021 without documented complications.      08/22/2021    1:24 PM 08/15/2021    2:08 PM 06/03/2021    7:53 AM  Vitals with BMI  Height  5' 2.5"   Weight 148 lbs 153 lbs   BMI 47.09 62.83   Systolic 662 947 654  Diastolic 74 79 57  Pulse 90 88 86    Providers/Specialists:   NOTE: Primary physician provider listed below. Patient may have been seen by APP or partner within same practice.   PROVIDER ROLE / SPECIALTY LAST OV  Schnier, Dolores Lory, MD Vascular Surgery (Surgeon) 08/22/2021  Gladstone Lighter, MD Primary Care Provider 08/04/2021  Serafina Royals, MD Cardiology 05/31/2021   Allergies:  Amitriptyline hcl, Duricef [cefadroxil], Levofloxacin, Tetanus toxoid, Tramadol, and Erythromycin  Current Home Medications:   No current facility-administered medications for this encounter.    amLODipine (NORVASC) 2.5 MG tablet   amoxicillin (AMOXIL) 500 MG capsule   ASPIRIN LOW DOSE 81 MG tablet   carvedilol (COREG) 3.125 MG tablet   clopidogrel (PLAVIX) 75 MG tablet   cyclobenzaprine (FLEXERIL) 5 MG tablet   HYDROcodone-acetaminophen (NORCO) 10-325 MG tablet   pravastatin (PRAVACHOL) 40 MG tablet   telmisartan (MICARDIS) 80 MG tablet   History:   Past Medical History:  Diagnosis Date   Acute right MCA stroke ([place]) 08/01/2021   a.) noted on MRI head 08/01/2021 --> punctate acute RIGHT MCA territory infarcts affecting the frontal and parietal contex consistent with  microembolic infarcts in the RIGHT carotid circulation   Anemia    Arthritis    Bilateral carotid artery disease ([place]) 08/18/2021   a.) CTA neck 08/18/2021: 65-03% RICA and 54% LICA   Cervicalgia    Chronic kidney disease    Chronic pain syndrome    Chronic, continuous use of opioids    a.) hydrocodone/APAP (Norco 10/325 mg)   Connective tissue disease ([place])    pt reports being diagnosed with undifferentiated connective tissue disease at some point- pt took steroids for this and has had no issues since- 10 years ago   Coronary artery disease    Cortical senile cataract    DDD (degenerative disc disease), cervical    a.) s/p C4-C6 ACDF   DDD (degenerative disc disease), lumbar    a.) s/p laminectomy + posterior L2-S1 fusion   History of 2019 novel coronavirus disease (COVID-19) 12/07/2018   History of blood transfusion    History of heart murmur in childhood    History of rheumatic fever    HLD (hyperlipidemia)    Hypertension    Lower extremity weakness    a.) with position changes from sitting to standing   Osteoporosis    Pseudogout    Shingles    Skin cancer of face    Spondylolisthesis of lumbar region    Past Surgical History:  Procedure Laterality Date   ABDOMINAL HYSTERECTOMY  1981   AIKEN OSTEOTOMY Right 01/30/2018   Procedure: WEIL RIGHT 2ND AND 3RD;  Surgeon: [name], DPM;  Location: [facility];  Service: Podiatry;  Laterality: Right;   ANTERIOR CERVICAL DECOMP/DISCECTOMY FUSION N/A  2003   C4-C6   BACK SURGERY     pt reports hx of 3 lumber back surgeries total   CERVICAL SPINE SURGERY     pt reports having 3 cervical fusions total   HAMMER TOE SURGERY Right 01/30/2018   Procedure: HAMMER TOE CORRECTION SECOND AND THIRD;  Surgeon: [name], DPM;  Location: [place];  Service: Podiatry;  Laterality: Right;  GENERAL WITH LOCAL   HAMMER TOE SURGERY Left 03/05/2019   Procedure: HAMMER TOE CORRECTION T1 AND T2;  Surgeon: [name], DPM;  Location: [place] [facility];  Service: Podiatry;  Laterality: Left;   POSTERIOR LUMBAR FUSION N/A 2001   L2-S1   ROTATOR CUFF REPAIR Right 1981   SACROPLASTY N/A 03/29/2017   S1   TOTAL KNEE ARTHROPLASTY Left 03/03/2013   TOTAL KNEE REVISION Left 07/31/2016   Procedure: TOTAL KNEE REVISION;  Surgeon: Dereck Leep, MD;  Location: [facility];  Service: Orthopedics;  Laterality: Left;   WEIL OSTEOTOMY Left 03/05/2019   Procedure: WEIL OSTEOTOMY X 2 LEFT;  Surgeon: [name], DPM;  Location: [facility];  Service: Podiatry;  Laterality: Left;   Family History  Problem Relation Age of Onset   Hypertension Brother    Hypertension Mother    Hypertension Sister    Cancer Sister    Diabetes Paternal Grandmother    Heart failure Maternal Grandmother    Social History   Tobacco Use   Smoking status: Former    Types: Cigarettes    Quit date: 04/17/1979    Years since quitting: 42.4   Smokeless tobacco: Never  Vaping Use   Vaping Use: Never used  Substance Use Topics   Alcohol use: No    Alcohol/week: 0.0 standard drinks of alcohol   Drug use: No    Pertinent Clinical Results:  LABS: Labs reviewed: Acceptable for surgery.  No visits with results within 3 Day(s) from this visit.  Latest known visit with results is:  Hospital Outpatient Visit on 09/13/2021  Component Date Value Ref Range Status   WBC 09/13/2021 6.8  4.0 - 10.5 K/uL Final   RBC 09/13/2021 4.18  3.87 - 5.11 MIL/uL Final   Hemoglobin 09/13/2021 12.1  12.0 - 15.0 g/dL Final   HCT 09/13/2021 37.9  36.0 - 46.0 % Final   MCV 09/13/2021 90.7  80.0 - 100.0 fL Final   MCH 09/13/2021 28.9  26.0 - 34.0 pg Final   MCHC 09/13/2021 31.9  30.0 - 36.0 g/dL Final   RDW 09/13/2021 15.0  11.5 - 15.5 % Final   Platelets 09/13/2021 291  150 - 400 K/uL Final   nRBC 09/13/2021 0.0  0.0 - 0.2 % Final   Performed at [facility], [address] [place], [place] [address]   ABO/RH(D) 09/13/2021 A  POS   Final   Antibody Screen 09/13/2021 NEG   Final   Sample Expiration 09/13/2021 09/27/2021,2359   Final   Extend sample reason 09/13/2021    Final                   Value:NO TRANSFUSIONS OR PREGNANCY IN THE PAST 3 MONTHS Performed at [facility] [place], [address] [place], [place] [address]    Sodium 09/13/2021 141  135 - 145 mmol/L Final   Potassium 09/13/2021 4.6  3.5 - 5.1 mmol/L Final   Chloride 09/13/2021 108  98 - 111 mmol/L Final   CO2 09/13/2021 27  22 - 32 mmol/L Final  Glucose, Bld 09/13/2021 106 (H)  70 - 99 mg/dL Final   Glucose reference range applies only to samples taken after fasting for at least 8 hours.   BUN 09/13/2021 27 (H)  8 - 23 mg/dL Final   Creatinine, Ser 09/13/2021 0.92  0.44 - 1.00 mg/dL Final   Calcium 09/13/2021 9.5  8.9 - 10.3 mg/dL Final   GFR, Estimated 09/13/2021 >60  >60 mL/min Final   Comment: (NOTE) Calculated using the CKD-EPI Creatinine Equation (2021)    Anion gap 09/13/2021 6  5 - 15 Final   Performed at [facility], [address] [place], [place] [address]    ECG: Date: 05/30/2021 Time ECG obtained: 1325 PM Rate: 66 bpm Rhythm: normal sinus Axis (leads I and aVF): Normal Intervals: PR 152 ms. QRS 82 ms. QTc 415 ms. ST segment and T wave changes: No evidence of acute ST segment elevation or depression Comparison: Similar to previous tracing obtained on 04/01/2020   IMAGING / PROCEDURES: CT ANGIO NECK W OR WO CONTRAST performed on 08/18/2021 Great vessels are patent 70-80% stenosis of the proximal RICA 40% stenosis of the LICA BILATERAL vertebral arteries are patent without significant (greater than 50%) stenosis Multilevel degenerative changes of the cervical spine.  Multilevel facet and uncovertebral hypertrophy continues to increase neural foraminal stenosis Previous C4-C6 ACDF with solid bony fusion  MRI BRAIN WO CONTRAST performed on 08/01/2021 Few punctate acute infarctions affecting the right  frontal cortex and the right parietal cortex, consistent with micro embolic disease in the right carotid circulation No large confluent infarction.  No swelling or hemorrhage. Mild to moderate chronic small-vessel ischemic changes otherwise affecting the cerebral hemispheric white matter.  CT LUMBAR SPINE W CONTRAST performed on 02/21/2021 Solid L2-S1 fusion without stenosis Moderate to severe spinal stenosis with moderate neuroforaminal stenosis at L1-L2 Mild to moderate spinal stenosis of moderate to severe neural foramen at T12-L1 Aortic atherosclerosis  TRANSTHORACIC ECHOCARDIOGRAM performed on 03/12/2020 Mildly reduced left ventricular systolic function with an EF of 45% Anterior hypokinesis Normal left ventricular diastolic Doppler parameters Normal right ventricular systolic function Mild MR and TR No AR or PR Normal transvalvular gradients; no valvular stenosis No pericardial effusion  Impression and Plan:  HYE TRAWICK has been referred for pre-anesthesia review and clearance prior to her undergoing the planned anesthetic and procedural courses. Available labs, pertinent testing, and imaging results were personally reviewed by me. This patient has been appropriately cleared by cardiology with an overall ACCEPTABLE risk of significant perioperative cardiovascular complications.  Based on clinical review performed today (09/16/21), barring any significant acute changes in the patient's overall condition, it is anticipated that she will be able to proceed with the planned surgical intervention. Any acute changes in clinical condition may necessitate her procedure being postponed and/or cancelled. Patient will meet with anesthesia team (MD and/or CRNA) on the day of her procedure for preoperative evaluation/assessment. Questions regarding anesthetic course will be fielded at that time.   Pre-surgical instructions were reviewed with the patient during her PAT appointment and questions  were fielded by PAT clinical staff. Patient was advised that if any questions or concerns arise prior to her procedure then she should return a call to PAT and/or her surgeon's office to discuss.  Honor Loh, MSN, APRN, FNP-C, CEN [facility]  Peri-operative Services Nurse Practitioner Phone: [phone] Fax: [phone] 09/16/21 2:06 PM  NOTE: This note has been prepared using Dragon dictation software. Despite my best ability to proofread, there  is always the potential that unintentional transcriptional errors may still occur from this process.

## 2021-09-21 ENCOUNTER — Encounter: Payer: Self-pay | Admitting: Vascular Surgery

## 2021-09-21 ENCOUNTER — Encounter
Admission: RE | Disposition: A | Discharge status: Home / Self Care | Payer: Self-pay | Source: Home / Self Care | Attending: Vascular Surgery

## 2021-09-21 ENCOUNTER — Inpatient Hospital Stay: Payer: Medicare HMO | Admitting: Urgent Care

## 2021-09-21 ENCOUNTER — Other Ambulatory Visit: Payer: Self-pay

## 2021-09-21 ENCOUNTER — Inpatient Hospital Stay
Admission: RE | Admit: 2021-09-21 | Discharge: 2021-09-23 | DRG: 038 | Disposition: A | Discharge status: Home / Self Care | Payer: Medicare HMO | Attending: Vascular Surgery | Admitting: Vascular Surgery

## 2021-09-21 DIAGNOSIS — Z981 Arthrodesis status: Secondary | ICD-10-CM | POA: Diagnosis not present

## 2021-09-21 DIAGNOSIS — I2510 Atherosclerotic heart disease of native coronary artery without angina pectoris: Secondary | ICD-10-CM | POA: Diagnosis present

## 2021-09-21 DIAGNOSIS — I110 Hypertensive heart disease with heart failure: Secondary | ICD-10-CM | POA: Diagnosis present

## 2021-09-21 DIAGNOSIS — Z87891 Personal history of nicotine dependence: Secondary | ICD-10-CM

## 2021-09-21 DIAGNOSIS — I6521 Occlusion and stenosis of right carotid artery: Principal | ICD-10-CM | POA: Diagnosis present

## 2021-09-21 DIAGNOSIS — Z01812 Encounter for preprocedural laboratory examination: Principal | ICD-10-CM

## 2021-09-21 DIAGNOSIS — I480 Paroxysmal atrial fibrillation: Secondary | ICD-10-CM | POA: Diagnosis present

## 2021-09-21 DIAGNOSIS — Z8249 Family history of ischemic heart disease and other diseases of the circulatory system: Secondary | ICD-10-CM | POA: Diagnosis not present

## 2021-09-21 DIAGNOSIS — G8929 Other chronic pain: Secondary | ICD-10-CM | POA: Diagnosis present

## 2021-09-21 DIAGNOSIS — Z7982 Long term (current) use of aspirin: Secondary | ICD-10-CM | POA: Diagnosis not present

## 2021-09-21 DIAGNOSIS — I5032 Chronic diastolic (congestive) heart failure: Secondary | ICD-10-CM | POA: Diagnosis present

## 2021-09-21 DIAGNOSIS — Z8616 Personal history of COVID-19: Secondary | ICD-10-CM | POA: Diagnosis not present

## 2021-09-21 DIAGNOSIS — Z96652 Presence of left artificial knee joint: Secondary | ICD-10-CM | POA: Diagnosis present

## 2021-09-21 DIAGNOSIS — Z85828 Personal history of other malignant neoplasm of skin: Secondary | ICD-10-CM | POA: Diagnosis not present

## 2021-09-21 DIAGNOSIS — Z833 Family history of diabetes mellitus: Secondary | ICD-10-CM | POA: Diagnosis not present

## 2021-09-21 DIAGNOSIS — I6529 Occlusion and stenosis of unspecified carotid artery: Secondary | ICD-10-CM | POA: Diagnosis present

## 2021-09-21 DIAGNOSIS — E782 Mixed hyperlipidemia: Secondary | ICD-10-CM | POA: Diagnosis present

## 2021-09-21 DIAGNOSIS — Z79899 Other long term (current) drug therapy: Secondary | ICD-10-CM

## 2021-09-21 DIAGNOSIS — Z90710 Acquired absence of both cervix and uterus: Secondary | ICD-10-CM | POA: Diagnosis not present

## 2021-09-21 DIAGNOSIS — G894 Chronic pain syndrome: Secondary | ICD-10-CM | POA: Diagnosis present

## 2021-09-21 DIAGNOSIS — M810 Age-related osteoporosis without current pathological fracture: Secondary | ICD-10-CM | POA: Diagnosis present

## 2021-09-21 DIAGNOSIS — Z8673 Personal history of transient ischemic attack (TIA), and cerebral infarction without residual deficits: Secondary | ICD-10-CM

## 2021-09-21 HISTORY — DX: Other symptoms and signs involving the musculoskeletal system: R29.898

## 2021-09-21 HISTORY — DX: Chronic pain syndrome: G89.4

## 2021-09-21 HISTORY — DX: Other intervertebral disc degeneration, lumbar region: M51.36

## 2021-09-21 HISTORY — PX: ENDARTERECTOMY: SHX5162

## 2021-09-21 HISTORY — DX: Opioid use, unspecified, uncomplicated: F11.90

## 2021-09-21 HISTORY — DX: Spondylolisthesis, lumbar region: M43.16

## 2021-09-21 HISTORY — DX: Other intervertebral disc degeneration, lumbar region without mention of lumbar back pain or lower extremity pain: M51.369

## 2021-09-21 HISTORY — DX: Personal history of other medical treatment: Z92.89

## 2021-09-21 HISTORY — DX: Unspecified malignant neoplasm of skin of unspecified part of face: C44.300

## 2021-09-21 HISTORY — DX: Other cervical disc degeneration, unspecified cervical region: M50.30

## 2021-09-21 HISTORY — DX: Other chondrocalcinosis, unspecified site: M11.20

## 2021-09-21 HISTORY — DX: Zoster without complications: B02.9

## 2021-09-21 HISTORY — DX: Cervicalgia: M54.2

## 2021-09-21 HISTORY — DX: Cortical age-related cataract, unspecified eye: H25.019

## 2021-09-21 HISTORY — DX: Age-related osteoporosis without current pathological fracture: M81.0

## 2021-09-21 HISTORY — DX: Personal history of other diseases of the circulatory system: Z86.79

## 2021-09-21 LAB — GLUCOSE, CAPILLARY: Glucose-Capillary: 166 mg/dL — ABNORMAL HIGH (ref 70–99)

## 2021-09-21 LAB — MRSA NEXT GEN BY PCR, NASAL: MRSA by PCR Next Gen: NOT DETECTED

## 2021-09-21 SURGERY — ENDARTERECTOMY, CAROTID
Anesthesia: General | Site: Neck | Laterality: Right

## 2021-09-21 MED ORDER — ONDANSETRON HCL 4 MG/2ML IJ SOLN
INTRAMUSCULAR | Status: AC
  Filled 2021-09-21: qty 2

## 2021-09-21 MED ORDER — CARVEDILOL 3.125 MG PO TABS
3.1250 mg | ORAL_TABLET | Freq: Two times a day (BID) | ORAL | Status: DC
  Administered 2021-09-22: 3.125 mg via ORAL
  Filled 2021-09-21: qty 1

## 2021-09-21 MED ORDER — LIDOCAINE HCL (PF) 2 % IJ SOLN
INTRAMUSCULAR | Status: AC
  Filled 2021-09-21: qty 5

## 2021-09-21 MED ORDER — DEXAMETHASONE SODIUM PHOSPHATE 10 MG/ML IJ SOLN
INTRAMUSCULAR | Status: DC | PRN
  Administered 2021-09-21: 10 mg via INTRAVENOUS

## 2021-09-21 MED ORDER — HEMOSTATIC AGENTS (NO CHARGE) OPTIME
TOPICAL | Status: DC | PRN
  Administered 2021-09-21: 1 via TOPICAL

## 2021-09-21 MED ORDER — MORPHINE SULFATE (PF) 2 MG/ML IV SOLN
2.0000 mg | INTRAVENOUS | Status: DC | PRN
  Administered 2021-09-21 (×5): 2 mg via INTRAVENOUS
  Administered 2021-09-21: 3 mg via INTRAVENOUS
  Filled 2021-09-21: qty 1
  Filled 2021-09-21: qty 2
  Filled 2021-09-21 (×2): qty 1

## 2021-09-21 MED ORDER — CHLORHEXIDINE GLUCONATE CLOTH 2 % EX PADS: 6.0000 | MEDICATED_PAD | Freq: Once | CUTANEOUS | Status: DC

## 2021-09-21 MED ORDER — VANCOMYCIN HCL IN DEXTROSE 1-5 GM/200ML-% IV SOLN
1000.0000 mg | Freq: Two times a day (BID) | INTRAVENOUS | Status: AC
  Administered 2021-09-21 – 2021-09-22 (×2): 1000 mg via INTRAVENOUS
  Filled 2021-09-21 (×2): qty 200

## 2021-09-21 MED ORDER — OXYCODONE HCL 5 MG/5ML PO SOLN: 5.0000 mg | Freq: Once | ORAL | Status: DC | PRN

## 2021-09-21 MED ORDER — PRAVASTATIN SODIUM 20 MG PO TABS
40.0000 mg | ORAL_TABLET | Freq: Every day | ORAL | Status: DC
  Administered 2021-09-21 – 2021-09-22 (×2): 40 mg via ORAL
  Filled 2021-09-21 (×2): qty 2

## 2021-09-21 MED ORDER — ACETAMINOPHEN 10 MG/ML IV SOLN
INTRAVENOUS | Status: AC
  Filled 2021-09-21: qty 100

## 2021-09-21 MED ORDER — PHENYLEPHRINE 80 MCG/ML (10ML) SYRINGE FOR IV PUSH (FOR BLOOD PRESSURE SUPPORT)
PREFILLED_SYRINGE | INTRAVENOUS | Status: DC | PRN
  Administered 2021-09-21: 160 ug via INTRAVENOUS
  Administered 2021-09-21: 80 ug via INTRAVENOUS

## 2021-09-21 MED ORDER — ROCURONIUM BROMIDE 100 MG/10ML IV SOLN
INTRAVENOUS | Status: DC | PRN
  Administered 2021-09-21: 50 mg via INTRAVENOUS
  Administered 2021-09-21: 10 mg via INTRAVENOUS
  Administered 2021-09-21: 20 mg via INTRAVENOUS

## 2021-09-21 MED ORDER — ONDANSETRON HCL 4 MG/2ML IJ SOLN
4.0000 mg | Freq: Four times a day (QID) | INTRAMUSCULAR | Status: DC | PRN
Start: 2021-09-21 — End: 2021-09-23

## 2021-09-21 MED ORDER — GUAIFENESIN-DM 100-10 MG/5ML PO SYRP: 15.0000 mL | ORAL_SOLUTION | ORAL | Status: DC | PRN

## 2021-09-21 MED ORDER — ONDANSETRON HCL 4 MG/2ML IJ SOLN
INTRAMUSCULAR | Status: DC | PRN
  Administered 2021-09-21: 4 mg via INTRAVENOUS

## 2021-09-21 MED ORDER — DOCUSATE SODIUM 100 MG PO CAPS
100.0000 mg | ORAL_CAPSULE | Freq: Every day | ORAL | Status: DC
  Administered 2021-09-22: 100 mg via ORAL
  Filled 2021-09-21 (×2): qty 1

## 2021-09-21 MED ORDER — ONDANSETRON HCL 4 MG/2ML IJ SOLN: 4.0000 mg | Freq: Four times a day (QID) | INTRAMUSCULAR | Status: DC | PRN

## 2021-09-21 MED ORDER — PROPOFOL 10 MG/ML IV BOLUS
INTRAVENOUS | Status: DC | PRN
  Administered 2021-09-21: 120 mg via INTRAVENOUS

## 2021-09-21 MED ORDER — MORPHINE SULFATE (PF) 2 MG/ML IV SOLN
INTRAVENOUS | Status: AC
  Filled 2021-09-21: qty 1

## 2021-09-21 MED ORDER — OXYCODONE HCL 5 MG PO TABS: 5.0000 mg | ORAL_TABLET | Freq: Once | ORAL | Status: DC | PRN

## 2021-09-21 MED ORDER — FENTANYL CITRATE (PF) 100 MCG/2ML IJ SOLN
25.0000 ug | INTRAMUSCULAR | Status: DC | PRN
  Administered 2021-09-21 (×3): 25 ug via INTRAVENOUS

## 2021-09-21 MED ORDER — PHENYLEPHRINE HCL-NACL 20-0.9 MG/250ML-% IV SOLN
INTRAVENOUS | Status: DC | PRN
  Administered 2021-09-21: 35 ug/min via INTRAVENOUS

## 2021-09-21 MED ORDER — MORPHINE SULFATE (PF) 4 MG/ML IV SOLN
INTRAVENOUS | Status: AC
  Filled 2021-09-21: qty 1

## 2021-09-21 MED ORDER — HYDROMORPHONE HCL 1 MG/ML IJ SOLN
1.0000 mg | Freq: Once | INTRAMUSCULAR | Status: AC | PRN
  Administered 2021-09-21: 1 mg via INTRAVENOUS
  Filled 2021-09-21: qty 1

## 2021-09-21 MED ORDER — VANCOMYCIN HCL IN DEXTROSE 1-5 GM/200ML-% IV SOLN
INTRAVENOUS | Status: AC
  Administered 2021-09-21: 1000 mg via INTRAVENOUS
  Filled 2021-09-21: qty 200

## 2021-09-21 MED ORDER — PHENOL 1.4 % MT LIQD
1.0000 | OROMUCOSAL | Status: DC | PRN
Start: 2021-09-21 — End: 2021-09-23

## 2021-09-21 MED ORDER — SODIUM CHLORIDE 0.9 % IV SOLN: 500.0000 mL | Freq: Once | INTRAVENOUS | Status: DC | PRN

## 2021-09-21 MED ORDER — VANCOMYCIN HCL IN DEXTROSE 1-5 GM/200ML-% IV SOLN: 1000.0000 mg | INTRAVENOUS | Status: AC

## 2021-09-21 MED ORDER — SODIUM CHLORIDE 0.9 % IV SOLN
INTRAVENOUS | Status: DC | PRN
  Administered 2021-09-21: 500 mL

## 2021-09-21 MED ORDER — OXYCODONE HCL 5 MG PO TABS
5.0000 mg | ORAL_TABLET | ORAL | Status: DC | PRN
  Administered 2021-09-21 – 2021-09-22 (×4): 10 mg via ORAL
  Administered 2021-09-23: 5 mg via ORAL
  Filled 2021-09-21 (×3): qty 2
  Filled 2021-09-21: qty 1
  Filled 2021-09-21: qty 2

## 2021-09-21 MED ORDER — SODIUM CHLORIDE FLUSH 0.9 % IV SOLN
INTRAVENOUS | Status: AC
  Administered 2021-09-21: 10 mL
  Filled 2021-09-21: qty 10

## 2021-09-21 MED ORDER — CHLORHEXIDINE GLUCONATE 0.12 % MT SOLN
15.0000 mL | Freq: Once | OROMUCOSAL | Status: AC
  Administered 2021-09-21: 15 mL via OROMUCOSAL

## 2021-09-21 MED ORDER — PROPOFOL 10 MG/ML IV BOLUS
INTRAVENOUS | Status: AC
  Filled 2021-09-21: qty 20

## 2021-09-21 MED ORDER — NITROGLYCERIN IN D5W 200-5 MCG/ML-% IV SOLN
INTRAVENOUS | Status: AC
  Filled 2021-09-21: qty 250

## 2021-09-21 MED ORDER — LACTATED RINGERS IV SOLN: INTRAVENOUS | Status: DC

## 2021-09-21 MED ORDER — ORAL CARE MOUTH RINSE: 15.0000 mL | Freq: Once | OROMUCOSAL | Status: AC

## 2021-09-21 MED ORDER — HEPARIN SODIUM (PORCINE) 5000 UNIT/ML IJ SOLN
INTRAMUSCULAR | Status: AC
  Filled 2021-09-21: qty 1

## 2021-09-21 MED ORDER — KCL IN DEXTROSE-NACL 20-5-0.9 MEQ/L-%-% IV SOLN
INTRAVENOUS | Status: DC
  Filled 2021-09-21 (×2): qty 1000

## 2021-09-21 MED ORDER — ACETAMINOPHEN 10 MG/ML IV SOLN
INTRAVENOUS | Status: DC | PRN
  Administered 2021-09-21: 1000 mg via INTRAVENOUS

## 2021-09-21 MED ORDER — PHENYLEPHRINE HCL-NACL 20-0.9 MG/250ML-% IV SOLN
INTRAVENOUS | Status: AC
  Filled 2021-09-21: qty 250

## 2021-09-21 MED ORDER — METOPROLOL TARTRATE 5 MG/5ML IV SOLN: 2.0000 mg | INTRAVENOUS | Status: DC | PRN

## 2021-09-21 MED ORDER — LIDOCAINE HCL (PF) 1 % IJ SOLN
INTRAMUSCULAR | Status: AC
  Filled 2021-09-21: qty 30

## 2021-09-21 MED ORDER — FENTANYL CITRATE (PF) 100 MCG/2ML IJ SOLN
INTRAMUSCULAR | Status: AC
  Filled 2021-09-21: qty 2

## 2021-09-21 MED ORDER — ASPIRIN 81 MG PO TBEC
81.0000 mg | DELAYED_RELEASE_TABLET | Freq: Every day | ORAL | Status: DC
  Administered 2021-09-21 – 2021-09-23 (×3): 81 mg via ORAL
  Filled 2021-09-21 (×3): qty 1

## 2021-09-21 MED ORDER — ROCURONIUM BROMIDE 10 MG/ML (PF) SYRINGE
PREFILLED_SYRINGE | INTRAVENOUS | Status: AC
  Filled 2021-09-21: qty 10

## 2021-09-21 MED ORDER — SUGAMMADEX SODIUM 200 MG/2ML IV SOLN
INTRAVENOUS | Status: DC | PRN
  Administered 2021-09-21: 200 mg via INTRAVENOUS

## 2021-09-21 MED ORDER — PANTOPRAZOLE SODIUM 40 MG IV SOLR
40.0000 mg | Freq: Every day | INTRAVENOUS | Status: DC
  Administered 2021-09-21 – 2021-09-22 (×2): 40 mg via INTRAVENOUS
  Filled 2021-09-21 (×2): qty 10

## 2021-09-21 MED ORDER — ACETAMINOPHEN 650 MG RE SUPP: 325.0000 mg | RECTAL | Status: DC | PRN

## 2021-09-21 MED ORDER — ACETAMINOPHEN 325 MG PO TABS: 325.0000 mg | ORAL_TABLET | ORAL | Status: DC | PRN

## 2021-09-21 MED ORDER — FENTANYL CITRATE (PF) 100 MCG/2ML IJ SOLN
INTRAMUSCULAR | Status: DC | PRN
Start: 2021-09-21 — End: 2021-09-21
  Administered 2021-09-21 (×2): 50 ug via INTRAVENOUS
  Administered 2021-09-21 (×2): 25 ug via INTRAVENOUS

## 2021-09-21 MED ORDER — NITROGLYCERIN 0.2 MG/ML ON CALL CATH LAB
INTRAVENOUS | Status: DC | PRN
  Administered 2021-09-21: 20 ug via INTRAVENOUS
  Administered 2021-09-21 (×2): 40 ug via INTRAVENOUS

## 2021-09-21 MED ORDER — LABETALOL HCL 5 MG/ML IV SOLN: 10.0000 mg | INTRAVENOUS | Status: DC | PRN

## 2021-09-21 MED ORDER — LABETALOL HCL 5 MG/ML IV SOLN
INTRAVENOUS | Status: DC | PRN
  Administered 2021-09-21: 5 mg via INTRAVENOUS

## 2021-09-21 MED ORDER — DEXAMETHASONE SODIUM PHOSPHATE 10 MG/ML IJ SOLN
INTRAMUSCULAR | Status: AC
  Filled 2021-09-21: qty 1

## 2021-09-21 MED ORDER — FIBRIN SEALANT 2 ML SINGLE DOSE KIT
PACK | CUTANEOUS | Status: DC | PRN
  Administered 2021-09-21: 2 mL via TOPICAL

## 2021-09-21 MED ORDER — HEPARIN SODIUM (PORCINE) 1000 UNIT/ML IJ SOLN
INTRAMUSCULAR | Status: DC | PRN
  Administered 2021-09-21: 7500 [IU] via INTRAVENOUS

## 2021-09-21 MED ORDER — SODIUM CHLORIDE 0.9 % IV SOLN: INTRAVENOUS | Status: DC | PRN

## 2021-09-21 MED ORDER — FAMOTIDINE 20 MG PO TABS
20.0000 mg | ORAL_TABLET | Freq: Once | ORAL | Status: AC
  Administered 2021-09-21: 20 mg via ORAL

## 2021-09-21 MED ORDER — HYDRALAZINE HCL 20 MG/ML IJ SOLN: 5.0000 mg | INTRAMUSCULAR | Status: DC | PRN

## 2021-09-21 MED ORDER — LIDOCAINE HCL (CARDIAC) PF 100 MG/5ML IV SOSY
PREFILLED_SYRINGE | INTRAVENOUS | Status: DC | PRN
  Administered 2021-09-21: 60 mg via INTRAVENOUS

## 2021-09-21 MED ORDER — ALUM & MAG HYDROXIDE-SIMETH 200-200-20 MG/5ML PO SUSP: 15.0000 mL | ORAL | Status: DC | PRN

## 2021-09-21 MED ORDER — FENTANYL CITRATE (PF) 100 MCG/2ML IJ SOLN
INTRAMUSCULAR | Status: AC
  Administered 2021-09-21: 25 ug via INTRAVENOUS
  Filled 2021-09-21: qty 2

## 2021-09-21 SURGICAL SUPPLY — 60 items
BAG DECANTER FOR FLEXI CONT (MISCELLANEOUS) ×1 IMPLANT
BLADE SURG 15 STRL LF DISP TIS (BLADE) ×1 IMPLANT
BLADE SURG 15 STRL SS (BLADE) ×1
BLADE SURG SZ11 CARB STEEL (BLADE) ×1 IMPLANT
BOOT SUTURE AID YELLOW STND (SUTURE) ×2 IMPLANT
BRUSH SCRUB EZ  4% CHG (MISCELLANEOUS) ×1
BRUSH SCRUB EZ 4% CHG (MISCELLANEOUS) ×1 IMPLANT
CHLORAPREP W/TINT 26 (MISCELLANEOUS) ×1 IMPLANT
DERMABOND ADVANCED (GAUZE/BANDAGES/DRESSINGS) ×1
DERMABOND ADVANCED .7 DNX12 (GAUZE/BANDAGES/DRESSINGS) ×1 IMPLANT
DRAPE 3/4 80X56 (DRAPES) ×1 IMPLANT
DRAPE INCISE IOBAN 66X45 STRL (DRAPES) ×1 IMPLANT
DRAPE LAPAROTOMY 100X77 ABD (DRAPES) ×1 IMPLANT
DRESSING SURGICEL FIBRLLR 1X2 (HEMOSTASIS) ×1 IMPLANT
DRSG SURGICEL FIBRILLAR 1X2 (HEMOSTASIS) ×1
ELECT CAUTERY BLADE 6.4 (BLADE) ×1 IMPLANT
ELECT REM PT RETURN 9FT ADLT (ELECTROSURGICAL) ×1
ELECTRODE REM PT RTRN 9FT ADLT (ELECTROSURGICAL) ×1 IMPLANT
GLOVE SURG SYN 8.0 (GLOVE) ×1 IMPLANT
GLOVE SURG SYN 8.0 PF PI (GLOVE) ×1 IMPLANT
GOWN STRL REUS W/ TWL LRG LVL3 (GOWN DISPOSABLE) ×1 IMPLANT
GOWN STRL REUS W/ TWL XL LVL3 (GOWN DISPOSABLE) ×1 IMPLANT
GOWN STRL REUS W/TWL LRG LVL3 (GOWN DISPOSABLE) ×1
GOWN STRL REUS W/TWL XL LVL3 (GOWN DISPOSABLE) ×1
IV NS 500ML (IV SOLUTION) ×1
IV NS 500ML BAXH (IV SOLUTION) ×1 IMPLANT
KIT TURNOVER KIT A (KITS) ×1 IMPLANT
LABEL OR SOLS (LABEL) ×1 IMPLANT
LOOP RED MAXI  1X406MM (MISCELLANEOUS) ×2
LOOP VESSEL MAXI  1X406 RED (MISCELLANEOUS) ×2
LOOP VESSEL MAXI 1X406 RED (MISCELLANEOUS) ×2 IMPLANT
LOOP VESSEL MINI 0.8X406 BLUE (MISCELLANEOUS) ×1 IMPLANT
LOOPS BLUE MINI 0.8X406MM (MISCELLANEOUS) ×1
MANIFOLD NEPTUNE II (INSTRUMENTS) ×1 IMPLANT
NDL FILTER BLUNT 18X1 1/2 (NEEDLE) ×1 IMPLANT
NDL HYPO 25X1 1.5 SAFETY (NEEDLE) ×1 IMPLANT
NEEDLE FILTER BLUNT 18X 1/2SAF (NEEDLE) ×1
NEEDLE FILTER BLUNT 18X1 1/2 (NEEDLE) ×1 IMPLANT
NEEDLE HYPO 25X1 1.5 SAFETY (NEEDLE) ×1 IMPLANT
NS IRRIG 500ML POUR BTL (IV SOLUTION) ×1 IMPLANT
PACK BASIN MAJOR ARMC (MISCELLANEOUS) ×1 IMPLANT
PATCH CAROTID ECM VASC 1X10 (Prosthesis & Implant Heart) IMPLANT
SHUNT CAROTID STR REINF 3.0X4. (MISCELLANEOUS) ×1 IMPLANT
SPONGE T-LAP 18X18 ~~LOC~~+RFID (SPONGE) ×2 IMPLANT
SUT MNCRL+ 5-0 UNDYED PC-3 (SUTURE) ×1 IMPLANT
SUT MONOCRYL 5-0 (SUTURE) ×1
SUT PROLENE 6 0 BV (SUTURE) ×8 IMPLANT
SUT PROLENE 7 0 BV 1 (SUTURE) ×6 IMPLANT
SUT SILK 2 0 (SUTURE) ×1
SUT SILK 2-0 18XBRD TIE 12 (SUTURE) ×1 IMPLANT
SUT SILK 3 0 (SUTURE) ×1
SUT SILK 3-0 18XBRD TIE 12 (SUTURE) ×1 IMPLANT
SUT SILK 4 0 (SUTURE) ×1
SUT SILK 4-0 18XBRD TIE 12 (SUTURE) ×1 IMPLANT
SUT VIC AB 3-0 SH 27 (SUTURE) ×1
SUT VIC AB 3-0 SH 27X BRD (SUTURE) ×1 IMPLANT
SYR 10ML LL (SYRINGE) ×1 IMPLANT
SYR 20ML LL LF (SYRINGE) ×1 IMPLANT
SYR 3ML LL SCALE MARK (SYRINGE) ×1 IMPLANT
TRAY FOLEY MTR SLVR 16FR STAT (SET/KITS/TRAYS/PACK) ×1 IMPLANT

## 2021-09-21 NOTE — Interval H&P Note (Signed)
History and Physical Interval Note:  09/21/2021 7:55 AM  Molly Mitchell  has presented today for surgery, with the diagnosis of CAROTID STENOSIS.  The various methods of treatment have been discussed with the patient and family. After consideration of risks, benefits and other options for treatment, the patient has consented to  Procedure(s): ENDARTERECTOMY CAROTID (Right) as a surgical intervention.  The patient's history has been reviewed, patient examined, no change in status, stable for surgery.  I have reviewed the patient's chart and labs.  Questions were answered to the patient's satisfaction.     [name]

## 2021-09-21 NOTE — Anesthesia Preprocedure Evaluation (Signed)
Anesthesia Evaluation  Patient identified by MRN, date of birth, ID band Patient awake    Reviewed: Allergy & Precautions, NPO status , Patient's Chart, lab work & pertinent test results  History of Anesthesia Complications Negative for: history of anesthetic complications  Airway Mallampati: II  TM Distance: >3 FB Neck ROM: full    Dental  (+) Dental Advidsory Given, Teeth Intact   Pulmonary neg pulmonary ROS, neg shortness of breath, neg sleep apnea, former smoker,    Pulmonary exam normal        Cardiovascular hypertension, + CAD  (-) Past MI Normal cardiovascular exam     Neuro/Psych TIAnegative psych ROS   GI/Hepatic negative GI ROS, Neg liver ROS,   Endo/Other  negative endocrine ROS  Renal/GU Renal disease     Musculoskeletal   Abdominal   Peds  Hematology negative hematology ROS (+)   Anesthesia Other Findings Past Medical History: 08/01/2021: Acute right MCA stroke ([place])     Comment:  a.) noted on MRI head 08/01/2021 --> punctate acute               RIGHT MCA territory infarcts affecting the frontal and               parietal contex consistent with microembolic infarcts in               the RIGHT carotid circulation No date: Anemia No date: Arthritis 08/18/2021: Bilateral carotid artery disease ([facility])     Comment:  a.) CTA neck 08/18/2021: 78-24% RICA and 23% LICA No date: Cervicalgia No date: Chronic kidney disease No date: Chronic pain syndrome No date: Chronic, continuous use of opioids     Comment:  a.) hydrocodone/APAP (Norco 10/325 mg) No date: Connective tissue disease ([place])     Comment:  pt reports being diagnosed with undifferentiated               connective tissue disease at some point- pt took steroids              for this and has had no issues since- 10 years ago No date: Coronary artery disease No date: Cortical senile cataract No date: DDD (degenerative disc disease), cervical      Comment:  a.) s/p C4-C6 ACDF No date: DDD (degenerative disc disease), lumbar     Comment:  a.) s/p laminectomy + posterior L2-S1 fusion 12/07/2018: History of 2019 novel coronavirus disease (COVID-19) No date: History of blood transfusion No date: History of heart murmur in childhood No date: History of rheumatic fever No date: HLD (hyperlipidemia) No date: Hypertension No date: Lower extremity weakness     Comment:  a.) with position changes from sitting to standing No date: Osteoporosis No date: Pseudogout No date: Shingles No date: Skin cancer of face No date: Spondylolisthesis of lumbar region  Past Surgical History: 1981: ABDOMINAL HYSTERECTOMY 01/30/2018: Barbie Banner OSTEOTOMY; Right     Comment:  Procedure: WEIL RIGHT 2ND AND 3RD;  Surgeon: [name], DPM;  Location: [place];  Service:               Podiatry;  Laterality: Right; 2003: ANTERIOR CERVICAL DECOMP/DISCECTOMY FUSION; N/A     Comment:  C4-C6 No date: BACK SURGERY     Comment:  pt reports hx of 3 lumber back surgeries total No date: CERVICAL SPINE SURGERY     Comment:  pt reports having 3  cervical fusions total 01/30/2018: HAMMER TOE SURGERY; Right     Comment:  Procedure: HAMMER TOE CORRECTION SECOND AND THIRD;                Surgeon: [name], DPM;  Location: [place];  Service: Podiatry;  Laterality: Right;  GENERAL               WITH LOCAL 03/05/2019: HAMMER TOE SURGERY; Left     Comment:  Procedure: HAMMER TOE CORRECTION T1 AND T2;  Surgeon:               [name], DPM;  Location: [place];                Service: Podiatry;  Laterality: Left; 2001: POSTERIOR LUMBAR FUSION; N/A     Comment:  L2-S1 1981: ROTATOR CUFF REPAIR; Right 03/29/2017: SACROPLASTY; N/A     Comment:  S1 03/03/2013: TOTAL KNEE ARTHROPLASTY; Left 07/31/2016: TOTAL KNEE REVISION; Left     Comment:  Procedure: TOTAL KNEE REVISION;  Surgeon: Dereck Leep, MD;  Location: [facility];  Service: Orthopedics;                Laterality: Left; 03/05/2019: WEIL OSTEOTOMY; Left     Comment:  Procedure: WEIL OSTEOTOMY X 2 LEFT;  Surgeon: [name], DPM;  Location: [place];  Service:               Podiatry;  Laterality: Left;     Reproductive/Obstetrics negative OB ROS                             Anesthesia Physical Anesthesia Plan  ASA: 2  Anesthesia Plan: General ETT   Post-op Pain Management:    Induction: Intravenous  PONV Risk Score and Plan: 3 and Dexamethasone and Ondansetron  Airway Management Planned: Oral ETT  Additional Equipment: Arterial line  Intra-op Plan:   Post-operative Plan: Extubation in OR  Informed Consent: I have reviewed the patients History and Physical, chart, labs and discussed the procedure including the risks, benefits and alternatives for the proposed anesthesia with the patient or authorized representative who has indicated his/her understanding and acceptance.     Dental Advisory Given  Plan Discussed with: Anesthesiologist, CRNA and Surgeon  Anesthesia Plan Comments: (Patient consented for risks of anesthesia including but not limited to:  - adverse reactions to medications - damage to eyes, teeth, lips or other oral mucosa - nerve damage due to positioning  - sore throat or hoarseness - Damage to heart, brain, nerves, lungs, other parts of body or loss of life  Patient voiced understanding.)        Anesthesia Quick Evaluation

## 2021-09-21 NOTE — Anesthesia Procedure Notes (Signed)
Procedure Name: Intubation Date/Time: 09/21/2021 8:46 AM  Performed by: Loletha Grayer, CRNAPre-anesthesia Checklist: Patient identified, Patient being monitored, Timeout performed, Emergency Drugs available and Suction available Patient Re-evaluated:Patient Re-evaluated prior to induction Oxygen Delivery Method: Circle system utilized Preoxygenation: Pre-oxygenation with 100% oxygen Induction Type: IV induction Ventilation: Mask ventilation without difficulty and Oral airway inserted - appropriate to patient size Laryngoscope Size: 3 and McGraph Grade View: Grade I Tube type: Oral Tube size: 7.0 mm Number of attempts: 1 Airway Equipment and Method: Stylet Placement Confirmation: ETT inserted through vocal cords under direct vision, positive ETCO2 and breath sounds checked- equal and bilateral Secured at: 21 cm Tube secured with: Tape Dental Injury: Teeth and Oropharynx as per pre-operative assessment

## 2021-09-21 NOTE — Progress Notes (Signed)
Informed pt's husband pt is still in PACU and waiting on room in ICU.  VSS, and resting in bed with eyes closed.

## 2021-09-21 NOTE — Anesthesia Procedure Notes (Signed)
Arterial Line Insertion Start/End9/06/2021 8:48 AM, 09/21/2021 8:53 AM Performed by: CRNA  Patient location: Pre-op. Preanesthetic checklist: patient identified, IV checked, site marked, risks and benefits discussed, surgical consent, monitors and equipment checked, pre-op evaluation, timeout performed and anesthesia consent radial was placed Catheter size: 20 G Hand hygiene performed  and maximum sterile barriers used   Attempts: 1 Procedure performed using ultrasound guided technique. Ultrasound Notes:anatomy identified, needle tip was noted to be adjacent to the nerve/plexus identified and no ultrasound evidence of intravascular and/or intraneural injection Following insertion, dressing applied. Post procedure assessment: normal and unchanged  Additional procedure comments: Placed under GA after induction.Marland Kitchen

## 2021-09-21 NOTE — Transfer of Care (Signed)
Immediate Anesthesia Transfer of Care Note  Patient: Molly Mitchell  Procedure(s) Performed: ENDARTERECTOMY CAROTID (Right: Neck)  Patient Location: PACU  Anesthesia Type:General  Level of Consciousness: awake  Airway & Oxygen Therapy: Patient Spontanous Breathing and Patient connected to nasal cannula oxygen  Post-op Assessment: Report given to RN and Post -op Vital signs reviewed and stable  Post vital signs: Reviewed and stable  Last Vitals:  Vitals Value Taken Time  BP    Temp    Pulse 64 09/21/21 1134  Resp 9 09/21/21 1134  SpO2 98 % 09/21/21 1134  Vitals shown include unvalidated device data.  Last Pain:  Vitals:   09/21/21 0802  TempSrc: Temporal  PainSc: 3          Complications: No notable events documented.

## 2021-09-21 NOTE — Op Note (Signed)
[facility]   OPERATIVE NOTE  PROCEDURE:   1.  Right carotid endarterectomy with CorMatrix arterial patch reconstruction  PRE-OPERATIVE DIAGNOSIS: 1.  Critical right carotid stenosis   POST-OPERATIVE DIAGNOSIS: same as above   SURGEON: Katha Cabal, MD  ASSISTANT(S): None  ANESTHESIA: general  ESTIMATED BLOOD LOSS: 100 cc  FINDING(S): 1.  Extensive calcified carotid plaque.  SPECIMEN(S):  Carotid plaque (sent to Pathology)  INDICATIONS:   Molly Mitchell is a 76 y.o. y.o. female who presents with critical  carotid stenosis of 90%.  The risks, benefits, and alternatives to carotid endarterectomy were discussed with the patient. The differences between carotid stenting and carotid endarterectomy were reviewed.  The patient voiced understanding and appears to be aware that the risks of carotid endarterectomy include but are not limited to: bleeding, infection, stroke, myocardial infarction, death, cranial nerve injuries both temporary and permanent, neck hematoma, possible airway compromise, labile blood pressure post-operatively, cerebral hyperperfusion syndrome, and possible need for additional interventions in the future. The patient is aware of the risks and agrees to proceed forward with the procedure.  DESCRIPTION: After full informed written consent was obtained from the patient, the patient was brought back to the operating room and placed supine upon the operating table.  Prior to induction, the patient received IV antibiotics.  After obtaining adequate anesthesia, the patient was placed a supine position with a shoulder roll in place and the patient's neck slightly hyperextended and rotated away from the surgical site.  The patient was prepped in the standard fashion for a carotid endarterectomy.    A first assistant was required to provide a safe and appropriate environment for executing the surgery.  The assistant was integral in providing  retraction, exposure, running suture providing suction and in the closing process.  The incision was made anterior to the sternocleidomastoid muscle and dissected down through the subcutaneous tissue.  The platysmas was opened with electrocautery.  The internal jugular vein and facial vein were identified.  The facial vein is ligated and divided between 2-0 silk ties.  The omohyoid was identified in the common carotid artery exposed at this level. The dissection was there in carried out along the carotid artery in a cranial direction.  The dissection was then carried along periadventitial plane along the common carotid artery up to the bifurcation. The external carotid artery was identified. Vessel loops were then placed around the external carotid artery as well as the superior thyroid artery. In the process of this dissection, the hypoglossal nerve was identified and protected from harm.  The internal carotid artery was then dissected circumferentially just beyond an area in the internal carotid artery distal to the plaque.    At this point, we gave the patient 7000 units of intravenous heparin.  After this was allowed to circulate for several minutes, the common carotid followed by the external carotid and then the internal carotid artery were clamped.  Arteriotomy was made in the common carotid artery with a 11 blade, and extended the arteriotomy with a Potts scissor down into the common carotid artery, then the arteriotomy was carried through the bifurcation into the internal carotid artery until I reached an area that was not diseased.  At this point, a Sundt shunt was placed.  The endarterectomy was begun in the common carotid artery with a Garment/textile technologist and carried this dissection down into the common carotid artery circumferentially.  Then I transected the plaque at a segment where it was adherent  and transected the plaque with Potts scissors.  I then carried this dissection up into the external  carotid artery.  The plaque was extracted by unclamping the external carotid artery and performing an eversion endarterectomy.  The dissection was then carried into the internal carotid artery where a  feathered end point was created.  The plaque was passed off the field as a specimen.  The distal endpoint was tacked down with 6 interrupted 7-0 Prolene sutures.  A CorMatrix arterial patch was delivered onto the field and trimmed appropriately for the artery and sewed in place with 6-0 Prolene using a 4 quadrant technique.  The medial suture line was completed and the lateral suture line was run approximately one quarter the length of the arteriotomy.  Prior to completing this patch angioplasty, the shunt was removed, the internal carotid artery was flushed and there was excellent backbleeding.  The carotid artery repair was flushed with heparinized saline and then the patch angioplasty was completed in the usual fashion.  The flow was then reestablished first to the external carotid artery and then the internal carotid artery to prevent distal embolization.   Several minutes of pressure were held and 6-0 Prolene patch sutures were used as need for hemostasis.  At this point, I placed Surgicel and Evicel topical hemostatic agents.  There was no more active bleeding in the surgical site.  The sternocleidomastoid space was closed with three interrupted 3-0 Vicryl sutures. I then reapproximated the platysma muscle with a running stitch of 3-0 Vicryl.  The skin was then closed with a running subcuticular 4-0 Monocryl.  The skin was then cleaned, dried and Dermabond was used to reinforce the skin closure.  The patient awakened and was taken to the recovery room in stable condition, following commands and moving all four extremities without any apparent deficits.    COMPLICATIONS: none  CONDITION: stable  [name] 09/21/2021<11:26 AM

## 2021-09-21 NOTE — Anesthesia Postprocedure Evaluation (Signed)
Anesthesia Post Note  Patient: Molly Mitchell  Procedure(s) Performed: ENDARTERECTOMY CAROTID (Right: Neck)  Patient location during evaluation: PACU Anesthesia Type: General Level of consciousness: awake and alert Pain management: pain level controlled Vital Signs Assessment: post-procedure vital signs reviewed and stable Respiratory status: spontaneous breathing, nonlabored ventilation, respiratory function stable and patient connected to nasal cannula oxygen Cardiovascular status: blood pressure returned to baseline and stable Postop Assessment: no apparent nausea or vomiting Anesthetic complications: no   No notable events documented.   Last Vitals:  Vitals:   09/21/21 1216 09/21/21 1230  BP: (!) 116/50 (!) 106/51  Pulse: (!) 52 (!) 58  Resp: 16 15  Temp:    SpO2: 100% 100%    Last Pain:  Vitals:   09/21/21 1211  TempSrc:   PainSc: [place]

## 2021-09-22 ENCOUNTER — Inpatient Hospital Stay: Admission: RE | Admit: 2021-09-22 | Payer: Medicare HMO | Source: Home / Self Care | Admitting: Vascular Surgery

## 2021-09-22 ENCOUNTER — Encounter: Payer: Self-pay | Admitting: Vascular Surgery

## 2021-09-22 LAB — CBC
HCT: 31.7 % — ABNORMAL LOW (ref 36.0–46.0)
Hemoglobin: 10.1 g/dL — ABNORMAL LOW (ref 12.0–15.0)
MCH: 29 pg (ref 26.0–34.0)
MCHC: 31.9 g/dL (ref 30.0–36.0)
MCV: 91.1 fL (ref 80.0–100.0)
Platelets: 246 10*3/uL (ref 150–400)
RBC: 3.48 MIL/uL — ABNORMAL LOW (ref 3.87–5.11)
RDW: 15.8 % — ABNORMAL HIGH (ref 11.5–15.5)
WBC: 12.7 10*3/uL — ABNORMAL HIGH (ref 4.0–10.5)
nRBC: 0 % (ref 0.0–0.2)

## 2021-09-22 LAB — BASIC METABOLIC PANEL
Anion gap: 7 (ref 5–15)
BUN: 19 mg/dL (ref 8–23)
CO2: 23 mmol/L (ref 22–32)
Calcium: 8.5 mg/dL — ABNORMAL LOW (ref 8.9–10.3)
Chloride: 106 mmol/L (ref 98–111)
Creatinine, Ser: 0.94 mg/dL (ref 0.44–1.00)
GFR, Estimated: >60 mL/min (ref >60)
Glucose, Bld: 156 mg/dL — ABNORMAL HIGH (ref 70–99)
Potassium: 5 mmol/L (ref 3.5–5.1)
Sodium: 136 mmol/L (ref 135–145)

## 2021-09-22 LAB — HEPARIN LEVEL (UNFRACTIONATED): Heparin Unfractionated: 0.44 IU/mL (ref 0.30–0.70)

## 2021-09-22 LAB — MAGNESIUM: Magnesium: 2.4 mg/dL (ref 1.7–2.4)

## 2021-09-22 LAB — SURGICAL PATHOLOGY

## 2021-09-22 LAB — TSH: TSH: 0.41 u[IU]/mL (ref 0.350–4.500)

## 2021-09-22 MED ORDER — DILTIAZEM HCL 25 MG/5ML IV SOLN
10.0000 mg | Freq: Once | INTRAVENOUS | Status: AC
  Administered 2021-09-22: 10 mg via INTRAVENOUS
  Filled 2021-09-22: qty 5

## 2021-09-22 MED ORDER — HEPARIN (PORCINE) 25000 UT/250ML-% IV SOLN
850.0000 [IU]/h | INTRAVENOUS | Status: DC
  Administered 2021-09-22: 850 [IU]/h via INTRAVENOUS
  Filled 2021-09-22: qty 250

## 2021-09-22 MED ORDER — LORAZEPAM 1 MG PO TABS
0.5000 mg | ORAL_TABLET | Freq: Four times a day (QID) | ORAL | Status: DC | PRN
  Administered 2021-09-22: 0.5 mg via ORAL
  Filled 2021-09-22: qty 1

## 2021-09-22 MED ORDER — METOPROLOL TARTRATE 25 MG PO TABS
25.0000 mg | ORAL_TABLET | Freq: Two times a day (BID) | ORAL | Status: DC
  Administered 2021-09-22: 25 mg via ORAL
  Filled 2021-09-22: qty 1

## 2021-09-22 MED ORDER — CHLORHEXIDINE GLUCONATE CLOTH 2 % EX PADS: 6.0000 | MEDICATED_PAD | Freq: Every day | CUTANEOUS | Status: DC

## 2021-09-22 MED ORDER — HEPARIN BOLUS VIA INFUSION
1500.0000 [IU] | Freq: Once | INTRAVENOUS | Status: AC
  Administered 2021-09-22: 1500 [IU] via INTRAVENOUS
  Filled 2021-09-22: qty 1500

## 2021-09-22 MED ORDER — DILTIAZEM HCL 60 MG PO TABS: 60.0000 mg | ORAL_TABLET | Freq: Four times a day (QID) | ORAL | Status: DC

## 2021-09-22 MED ORDER — POLYETHYLENE GLYCOL 3350 17 G PO PACK
17.0000 g | PACK | Freq: Every day | ORAL | Status: DC | PRN
Start: 2021-09-22 — End: 2021-09-23
  Administered 2021-09-22: 17 g via ORAL
  Filled 2021-09-22: qty 1

## 2021-09-22 MED ORDER — DILTIAZEM HCL ER COATED BEADS 240 MG PO CP24
240.0000 mg | ORAL_CAPSULE | Freq: Every day | ORAL | Status: DC
  Administered 2021-09-22: 240 mg via ORAL
  Filled 2021-09-22 (×2): qty 1

## 2021-09-22 NOTE — Consult Note (Addendum)
Molly Mitchell       Patient ID: Molly SHERBURNE MRN: [id number] DOB/AGE: 1945-04-24 76 y.o.  Admit date: 09/21/2021 Referring Physician Eulogio Ditch, NP  Primary Physician Dr. Gladstone Lighter  Primary Cardiologist Dr. Nehemiah Massed   Reason for Consultation AF RVR   HPI: Molly Mitchell is a 83yoF with a PMH of HFpEF (EF 55%, g1dd 09/20/2021), hypertension, h/o R MCA CVA 07/2021, chronic low back pain s/p multiple spine surgeries, bilateral carotid artery stenosis who presented to [facility]21[facility]2021 for and elective right carotid endarterectomy with Dr. Delana Meyer which she tolerated without untoward event. On 9/7 she developed atrial fibrillation with RVR (new in onset), for which cardiology is consulted.  She presents with her husband and daughter who contribute to the history.  She presented for elective right carotid endarterectomy with Dr. Delana Meyer which was performed yesterday, and the procedure went well without adverse event and is in the ICU for recovery.  She is somewhat anxious and tearful currently, worried about everything that happened today.  She says she has felt relatively normal, just more worried after the alarm in her room was going off due to her heart rate and rhythm., denies any chest pain, palpitations, shortness of breath, peripheral edema or dizziness.  She denies prior history of atrial fibrillation or any history of arrhythmia.  She did have a CVA earlier this year there was concern for microembolic disease vs from her right carotid 90% occlusion.  She was on aspirin and Plavix, although Plavix was discontinued performed recently due to melena.  She has not been seen by GI.  Former smoker, quit 40 years ago, does not drink alcohol.  Family history of aortic aneurysm in her mother.  Vitals are notable for a blood pressure of 138/73, heart rate in the 110s to 140s in atrial fibrillation on telemetry, SPO2 greater than 90% on room air.  Labs are notable for a  potassium of 5.0, BUN/creatinine 19/0.94, GFR greater than 60.  Slight leukocytosis with WBCs 12.7, H&H 10.0/31.7, platelets 246.  TSH and magnesium pending.  Review of systems complete and found to be negative unless listed above     Past Medical History:  Diagnosis Date   Acute right MCA stroke ([place]) 08/01/2021   a.) noted on MRI head 08/01/2021 --> punctate acute RIGHT MCA territory infarcts affecting the frontal and parietal contex consistent with microembolic infarcts in the RIGHT carotid circulation   Anemia    Arthritis    Bilateral carotid artery disease ([place]) 08/18/2021   a.) CTA neck 08/18/2021: 28-31% RICA and 51% LICA   Cervicalgia    Chronic kidney disease    Chronic pain syndrome    Chronic, continuous use of opioids    a.) hydrocodone/APAP (Norco 10/325 mg)   Connective tissue disease ([place])    pt reports being diagnosed with undifferentiated connective tissue disease at some point- pt took steroids for this and has had no issues since- 10 years ago   Coronary artery disease    Cortical senile cataract    DDD (degenerative disc disease), cervical    a.) s/p C4-C6 ACDF   DDD (degenerative disc disease), lumbar    a.) s/p laminectomy + posterior L2-S1 fusion   History of 2019 novel coronavirus disease (COVID-19) 12/07/2018   History of blood transfusion    History of heart murmur in childhood    History of rheumatic fever    HLD (hyperlipidemia)    Hypertension    Lower extremity weakness  a.) with position changes from sitting to standing   Osteoporosis    Pseudogout    Shingles    Skin cancer of face    Spondylolisthesis of lumbar region     Past Surgical History:  Procedure Laterality Date   ABDOMINAL HYSTERECTOMY  1981   AIKEN OSTEOTOMY Right 01/30/2018   Procedure: WEIL RIGHT 2ND AND 3RD;  Surgeon: [name], DPM;  Location: [place];  Service: Podiatry;  Laterality: Right;   ANTERIOR CERVICAL DECOMP/DISCECTOMY FUSION N/A 2003   C4-C6    BACK SURGERY     pt reports hx of 3 lumber back surgeries total   CERVICAL SPINE SURGERY     pt reports having 3 cervical fusions total   ENDARTERECTOMY Right 09/21/2021   Procedure: ENDARTERECTOMY CAROTID;  Surgeon: Katha Cabal, MD;  Location: [facility];  Service: Vascular;  Laterality: Right;  Right carotid   HAMMER TOE SURGERY Right 01/30/2018   Procedure: HAMMER TOE CORRECTION SECOND AND THIRD;  Surgeon: [name], DPM;  Location: [place];  Service: Podiatry;  Laterality: Right;  GENERAL WITH LOCAL   HAMMER TOE SURGERY Left 03/05/2019   Procedure: HAMMER TOE CORRECTION T1 AND T2;  Surgeon: [name], DPM;  Location: [place];  Service: Podiatry;  Laterality: Left;   POSTERIOR LUMBAR FUSION N/A 2001   L2-S1   ROTATOR CUFF REPAIR Right 1981   SACROPLASTY N/A 03/29/2017   S1   TOTAL KNEE ARTHROPLASTY Left 03/03/2013   TOTAL KNEE REVISION Left 07/31/2016   Procedure: TOTAL KNEE REVISION;  Surgeon: Dereck Leep, MD;  Location: [facility];  Service: Orthopedics;  Laterality: Left;   WEIL OSTEOTOMY Left 03/05/2019   Procedure: WEIL OSTEOTOMY X 2 LEFT;  Surgeon: [name], DPM;  Location: [place];  Service: Podiatry;  Laterality: Left;    Medications Prior to Admission  Medication Sig Dispense Refill Last Dose   amLODipine (NORVASC) 2.5 MG tablet Take 2.5 mg by mouth in the morning and at bedtime.   09/21/2021   amoxicillin (AMOXIL) 500 MG capsule Take 4 tablets by mouth 1 hour prior to dental procedures. 12 capsule 3 Past Month   ASPIRIN LOW DOSE 81 MG tablet Take 81 mg by mouth daily.   Past Week   carvedilol (COREG) 3.125 MG tablet Take 3.125 mg by mouth 2 (two) times daily with a meal.   09/21/2021   cyclobenzaprine (FLEXERIL) 5 MG tablet Take 1 tablet (5 mg total) by mouth 3 (three) times daily as needed for muscle spasms. 50 tablet 0 Past Week   HYDROcodone-acetaminophen (NORCO) 10-325 MG tablet Take 1 tablet by mouth every 6 (six)  hours as needed.   09/21/2021   pravastatin (PRAVACHOL) 40 MG tablet Take 40 mg by mouth at bedtime.   09/20/2021   telmisartan (MICARDIS) 80 MG tablet Take 80 mg by mouth daily.   09/20/2021   clopidogrel (PLAVIX) 75 MG tablet Take 75 mg by mouth daily. (Patient not taking: Reported on 08/22/2021)      Social History   Socioeconomic History   Marital status: Married    Spouse name: Eddie Dibbles   Number of children: 2   Years of education: Not on file   Highest education level: High school graduate  Occupational History   Occupation: retired  Tobacco Use   Smoking status: Former    Types: Cigarettes    Quit date: 04/17/1979    Years since quitting: 42.4   Smokeless tobacco: Never  Vaping Use  Vaping Use: Never used  Substance and Sexual Activity   Alcohol use: No    Alcohol/week: 0.0 standard drinks of alcohol   Drug use: No   Sexual activity: Not on file  Other Topics Concern   Not on file  Social History Narrative   Not on file   Social Determinants of Health   Financial Resource Strain: Low Risk  (03/23/2020)   Overall Financial Resource Strain (CARDIA)    Difficulty of Paying Living Expenses: Not hard at all  Food Insecurity: No Food Insecurity (09/21/2021)   Hunger Vital Sign    Worried About Running Out of Food in the Last Year: Never true    Ran Out of Food in the Last Year: Never true  Transportation Needs: No Transportation Needs (09/21/2021)   PRAPARE - Hydrologist (Medical): No    Lack of Transportation (Non-Medical): No  Physical Activity: Sufficiently Active (03/23/2020)   Exercise Vital Sign    Days of Exercise per Week: 3 days    Minutes of Exercise per Session: 60 min  Stress: No Stress Concern Present (03/23/2020)   [place]    Feeling of Stress : Not at all  Social Connections: [place] (03/23/2020)   Social Connection and Isolation Panel [NHANES]    Frequency of  Communication with Friends and Family: Never    Frequency of Social Gatherings with Friends and Family: More than three times a week    Attends Religious Services: More than 4 times per year    Active Member of [employer] or Organizations: Yes    Attends Music therapist: More than 4 times per year    Marital Status: Married  Human resources officer Violence: Not At Risk (09/21/2021)   Humiliation, Afraid, Rape, and Kick questionnaire    Fear of Current or Ex-Partner: No    Emotionally Abused: No    Physically Abused: No    Sexually Abused: No    Family History  Problem Relation Age of Onset   Hypertension Brother    Hypertension Mother    Hypertension Sister    Cancer Sister    Diabetes Paternal Grandmother    Heart failure Maternal Grandmother       PHYSICAL EXAM General: Pleasant elderly Caucasian female, well nourished, in no acute distress.  Lying at incline in ICU bed with daughter and husband at bedside. HEENT:  Normocephalic and atraumatic. Neck:  No JVD.  Expected postsurgical ecchymosis to her right neck Lungs: Normal respiratory effort on room air. Clear bilaterally to auscultation. No wheezes, crackles, rhonchi.  Heart: Tachycardic irregularly irregular rhythm. Normal S1 and S2 without gallops or murmurs.  Abdomen: Non-distended appearing.  Msk: Normal strength and tone for age. Extremities: Warm and well perfused. No clubbing, cyanosis.  No peripheral edema.  Neuro: Alert and oriented X 3. Psych: Anxious and tearful.  Answers questions appropriately.   Labs: Basic Metabolic Panel: Recent Labs    09/22/21 0610  NA 136  K 5.0  CL 106  CO2 23  GLUCOSE 156*  BUN 19  CREATININE 0.94  CALCIUM 8.5*   Liver Function Tests: No results for input(s): "AST", "ALT", "ALKPHOS", "BILITOT", "PROT", "ALBUMIN" in the last 72 hours. No results for input(s): "LIPASE", "AMYLASE" in the last 72 hours. CBC: Recent Labs    09/22/21 0610  WBC 12.7*  HGB 10.1*  HCT 31.7*   MCV 91.1  PLT 246   Cardiac Enzymes: No results  for input(s): "CKTOTAL", "CKMB", "CKMBINDEX", "TROPONINIHS" in the last 72 hours. BNP: Invalid input(s): "POCBNP" D-Dimer: No results for input(s): "DDIMER" in the last 72 hours. Hemoglobin A1C: No results for input(s): "HGBA1C" in the last 72 hours. Fasting Lipid Panel: No results for input(s): "CHOL", "HDL", "LDLCALC", "TRIG", "CHOLHDL", "LDLDIRECT" in the last 72 hours. Thyroid Function Tests: No results for input(s): "TSH", "T4TOTAL", "T3FREE", "THYROIDAB" in the last 72 hours.  Invalid input(s): "FREET3" Anemia Panel: No results for input(s): "VITAMINB12", "FOLATE", "FERRITIN", "TIBC", "IRON", "RETICCTPCT" in the last 72 hours.  No results found.   Radiology: No results found.  ECHO 09/20/2021 ECHOCARDIOGRAPHIC MEASUREMENTS  2D DIMENSIONS  AORTA                  Values   Normal Range   MAIN PA         Values    Normal Range                Annulus: nm*          [2.1-2.5]         PA Main: nm*       [1.5-2.1]              Aorta Sin: nm*          [2.7-3.3]    RIGHT VENTRICLE            ST Junction: nm*          [2.3-2.9]         RV Base: 3.7 cm    [<4.2]              Asc.Aorta: nm*          [2.3-3.1]          RV Mid: nm*       [<3.5]  LEFT VENTRICLE                                      RV Length: nm*       [<8.6]                  LVIDd: 4.2 cm       [3.9-5.3]    INFERIOR VENA CAVA                  LVIDs: 3.0 cm                        Max. IVC: nm*       [<=2.1]                     FS: 29.4 %       [>25]            Min. IVC: nm*                    SWT: 0.72 cm      [0.5-0.9]    ------------------                    PWT: 0.82 cm      [0.5-0.9]    nm* - not measured  LEFT ATRIUM                LA Diam: 3.6 cm       [2.7-3.8]  LA A4C Area: nm*          [<20]              LA Volume: nm*          [22-52]  _________________________________________________________________________________________  ECHOCARDIOGRAPHIC  DESCRIPTIONS  AORTIC ROOT                   Size: Normal             Dissection: INDETERM FOR DISSECTION  AORTIC VALVE               Leaflets: Tricuspid                   Morphology: Normal               Mobility: Fully mobile  LEFT VENTRICLE                   Size: Normal                        Anterior: Normal            Contraction: Normal                         Lateral: Normal             Closest EF: >55% (Estimated)                Septal: Normal              LV Masses: No Masses                       Apical: Normal                    LVH: None                          Inferior: Normal                                                      Posterior: Normal           Dias.FxClass: (Grade 1) relaxation abnormal, E/A reversal  MITRAL VALVE               Leaflets: Normal                        Mobility: Fully mobile             Morphology: Normal  LEFT ATRIUM                   Size: Normal                       LA Masses: No masses              IA Septum: Normal IAS  MAIN PA                   Size: Normal  PULMONIC VALVE             Morphology: Normal  Mobility: Fully mobile  RIGHT VENTRICLE              RV Masses: No Masses                         Size: Normal              Free Wall: Normal                     Contraction: Normal  TRICUSPID VALVE               Leaflets: Normal                        Mobility: Fully mobile             Morphology: Normal  RIGHT ATRIUM                   Size: Normal                        RA Other: None                RA Mass: No masses  PERICARDIUM                  Fluid: No effusion  INFERIOR VENACAVA                   Size: Normal Normal respiratory collapse  _________________________________________________________________________________________   DOPPLER ECHO and OTHER SPECIAL PROCEDURES                 Aortic: TRIVIAL AR                 No AS                 Mitral: TRIVIAL MR                 No MS                          MV Inflow E Vel = 95.4 cm/sec       MV Annulus E'Vel = 11.7 cm/sec                         E/E'Ratio = 8.2              Tricuspid: MILD TR                    No TS                         279.0 cm/sec peak TR vel   34.1 mmHg peak RV pressure              Pulmonary: TRIVIAL PR                 No PS  _________________________________________________________________________________________  INTERPRETATION  NORMAL LEFT VENTRICULAR SYSTOLIC FUNCTION  NORMAL RIGHT VENTRICULAR SYSTOLIC FUNCTION  MILD VALVULAR REGURGITATION (See above)  NO VALVULAR STENOSIS  No embolic source seen  _________________________________________________________________________________________  Electronically signed by      Rusty Aus, MD on 09/21/2021 07: 34 AM           Performed By: Scherrie November, RCS     Ordering Physician: Gladstone Lighter  03/12/2020 ECHOCARDIOGRAPHIC DESCRIPTIONS  AORTIC ROOT                   Size: Normal             Dissection: INDETERM FOR DISSECTION  AORTIC VALVE               Leaflets: Tricuspid                   Morphology: MILDLY THICKENED               Mobility: Fully mobile  LEFT VENTRICLE                   Size: Normal                        Anterior: HYPOCONTRACTILE            Contraction: REGIONALLY IMPAIRED            Lateral: Normal             Closest EF: 45% (Estimated)                 Septal: Normal              LV Masses: No Masses                       Apical: Normal                    LVH: None                          Inferior: Normal                                                      Posterior: Normal           Dias.FxClass: N/A  MITRAL VALVE               Leaflets: Normal                        Mobility: Fully mobile             Morphology: Normal  LEFT ATRIUM                   Size: Normal                       LA Masses: No masses              IA Septum: Normal IAS  MAIN PA                   Size: Normal  PULMONIC VALVE              Morphology: Normal                        Mobility: Fully mobile  RIGHT VENTRICLE              RV Masses: No Masses  Size: Normal              Free Wall: Normal                     Contraction: Normal  TRICUSPID VALVE               Leaflets: Normal                        Mobility: Fully mobile             Morphology: Normal  RIGHT ATRIUM                   Size: Normal                        RA Other: None                RA Mass: No masses  PERICARDIUM                  Fluid: No effusion  INFERIOR VENACAVA                   Size: Normal Normal respiratory collapse  _________________________________________________________________________________________   DOPPLER ECHO and OTHER SPECIAL PROCEDURES                 Aortic: No AR                      No AS                         141.0 cm/sec peak vel      7.9 mmHg peak grad                 Mitral: MILD MR                    No MS                         MV Inflow E Vel = 113.0 cm/sec      MV Annulus E'Vel = 5.1 cm/sec                         E/E'Ratio = 22.2              Tricuspid: MILD TR                    No TS                         259.3 cm/sec peak TR vel   29.9 mmHg peak RV pressure              Pulmonary: No PR                      No PS                         96.4 cm/sec peak vel       3.7 mmHg peak grad  _________________________________________________________________________________________  INTERPRETATION  MILD LV SYSTOLIC DYSFUNCTION (See above)  NORMAL RIGHT VENTRICULAR SYSTOLIC FUNCTION  MILD VALVULAR REGURGITATION (See above)  NO VALVULAR STENOSIS  MILD MR, TR  EF 45% with SUBTLE DISTAL  ANTERIOR AND APICAL HYPOKINESIS   TELEMETRY reviewed by me (LT) 09/22/2021 : Initially sinus rhythm, converted to A-fib with RVR around 1300 with a rate in the 140s to 150s  EKG reviewed by me: Atrial fibrillation with RVR rate 151.  Data reviewed by me (LT) 09/22/2021: Last cardiology Mitchell from 05/2021, vascular  op Mitchell, CBC, BMP, ordered magnesium and TSH EKG, telemetry, vitals  ASSESSMENT AND PLAN:  Blanch Stang is a 48yoF with a PMH of HFpEF (EF 55%, g1dd 09/20/2021), hypertension, h/o R MCA CVA 07/2021, chronic low back pain, bilateral carotid artery stenosis who presented to [facility]/21/2021 for and elective right carotid endarterectomy with Dr. Delana Meyer.  The patient tolerated the procedure without untoward event. On 9/7 she developed atrial fibrillation with RVR (new in onset), for which cardiology is consulted.  #New onset paroxysmal atrial fibrillation with RVR #S/p elective right CEA 09/21/2021 Converted from sinus rhythm to atrial fibrillation with RVR around 1300 on 9/7, heart rates peaking in the 150s, somewhat responsive to IV diltiazem 10 mg x2. -Check TSH, magnesium -Continuous monitoring on telemetry -S/p IV diltiazem 10 mg x 2, will start p.o. diltiazem 60 mg every 6 hours -We will start heparin GTT per pharmacy, likely transition to Eliquis 5 mg twice daily tomorrow. CHA2DS2-VASc 7 (age, sex, HTN, PAD, CVA) -Close monitoring of H&H and for any signs of bleeding (melena, hematochezia with her history of this while on DAPT earlier this year) -Continue IV Protonix, continue oral PPI discharge -Continue pravastatin 40 mg -Hold home amlodipine, carvedilol  #HFpEF (EF 55%, G1 DD 09/20/2021) Clinically euvolemic.  On room air.  This patient's plan of care was discussed and created with Dr. Nehemiah Massed and he is in agreement.  Signed: Tristan Schroeder , PA-C 09/22/2021, 1:37 PM [facility]  The patient has had an acute onset of atrial fibrillation with rapid ventricular rate for which she is tolerating well with no evidence of myocardial infarction congestive heart failure or other significant symptoms.  She does have a risk factor of previous bleeding and therefore we have chosen heparin at this time and heart rate management.  It is likely the patient will be able to have good heart  rate control and possible spontaneous conversion to normal sinus rhythm. I have personally reviewed EKG, chest x-ray, telemetry, and previous echocardiogram. The patient has been interviewed and examined. I agree with assessment and plan above. Serafina Royals MD [facility]

## 2021-09-22 NOTE — Consult Note (Signed)
ANTICOAGULATION CONSULT NOTE   Pharmacy Consult for Heparin  Indication:  New onset afib post carotid endart  Allergies  Allergen Reactions   Amitriptyline Hcl Swelling    Facial swelling   Duricef [Cefadroxil] Swelling    Facial swelling   Levofloxacin Other (See Comments)    Severe Shoulder Pain   Tetanus Toxoid Swelling    Arm swelling   Tramadol Itching   Erythromycin Rash and Other (See Comments)    Rash on bottom    Patient Measurements: Height: '5\' 2"'$  (157.5 cm) Weight: 65.8 kg (145 lb) IBW/kg (Calculated) : 50.1 Heparin Dosing Weight: 63.6 kg  Vital Signs: Temp: 98.2 F (36.8 C) (09/07 1900) Temp Source: Oral (09/07 1900) BP: 116/70 (09/07 1900) Pulse Rate: 76 (09/07 1900)  Labs: Recent Labs    09/22/21 0610 09/22/21 2253  HGB 10.1*  --   HCT 31.7*  --   PLT 246  --   HEPARINUNFRC  --  0.44  CREATININE 0.94  --      Estimated Creatinine Clearance: 46 mL/min (by C-G formula based on SCr of 0.94 mg/dL).   Medical History: Past Medical History:  Diagnosis Date   Acute right MCA stroke ([place]) 08/01/2021   a.) noted on MRI head 08/01/2021 --> punctate acute RIGHT MCA territory infarcts affecting the frontal and parietal contex consistent with microembolic infarcts in the RIGHT carotid circulation   Anemia    Arthritis    Bilateral carotid artery disease ([place]) 08/18/2021   a.) CTA neck 08/18/2021: 29-79% RICA and 89% LICA   Cervicalgia    Chronic kidney disease    Chronic pain syndrome    Chronic, continuous use of opioids    a.) hydrocodone/APAP (Norco 10/325 mg)   Connective tissue disease ([place])    pt reports being diagnosed with undifferentiated connective tissue disease at some point- pt took steroids for this and has had no issues since- 10 years ago   Coronary artery disease    Cortical senile cataract    DDD (degenerative disc disease), cervical    a.) s/p C4-C6 ACDF   DDD (degenerative disc disease), lumbar    a.) s/p laminectomy +  posterior L2-S1 fusion   History of 2019 novel coronavirus disease (COVID-19) 12/07/2018   History of blood transfusion    History of heart murmur in childhood    History of rheumatic fever    HLD (hyperlipidemia)    Hypertension    Lower extremity weakness    a.) with position changes from sitting to standing   Osteoporosis    Pseudogout    Shingles    Skin cancer of face    Spondylolisthesis of lumbar region     Medications:  Medications Prior to Admission  Medication Sig Dispense Refill Last Dose   amLODipine (NORVASC) 2.5 MG tablet Take 2.5 mg by mouth in the morning and at bedtime.   09/21/2021   amoxicillin (AMOXIL) 500 MG capsule Take 4 tablets by mouth 1 hour prior to dental procedures. 12 capsule 3 Past Month   ASPIRIN LOW DOSE 81 MG tablet Take 81 mg by mouth daily.   Past Week   carvedilol (COREG) 3.125 MG tablet Take 3.125 mg by mouth 2 (two) times daily with a meal.   09/21/2021   cyclobenzaprine (FLEXERIL) 5 MG tablet Take 1 tablet (5 mg total) by mouth 3 (three) times daily as needed for muscle spasms. 50 tablet 0 Past Week   HYDROcodone-acetaminophen (NORCO) 10-325 MG tablet Take 1 tablet by mouth  every 6 (six) hours as needed.   09/21/2021   pravastatin (PRAVACHOL) 40 MG tablet Take 40 mg by mouth at bedtime.   09/20/2021   telmisartan (MICARDIS) 80 MG tablet Take 80 mg by mouth daily.   09/20/2021   clopidogrel (PLAVIX) 75 MG tablet Take 75 mg by mouth daily. (Patient not taking: Reported on 08/22/2021)      Scheduled:   aspirin EC  81 mg Oral Daily   Chlorhexidine Gluconate Cloth  6 each Topical Daily   diltiazem  240 mg Oral Daily   docusate sodium  100 mg Oral Daily   metoprolol tartrate  25 mg Oral BID   pantoprazole (PROTONIX) IV  40 mg Intravenous QHS   pravastatin  40 mg Oral QHS   Infusions:   sodium chloride     dextrose 5 % and 0.9 % NaCl with KCl 20 mEq/L Stopped (09/22/21 0525)   heparin 850 Units/hr (09/22/21 1900)   PRN: sodium chloride, acetaminophen  **OR** acetaminophen, alum & mag hydroxide-simeth, guaiFENesin-dextromethorphan, hydrALAZINE, labetalol, LORazepam, metoprolol tartrate, morphine injection, ondansetron (ZOFRAN) IV, oxyCODONE, phenol, polyethylene glycol Anti-infectives (From admission, onward)    Start     Dose/Rate Route Frequency Ordered Stop   09/21/21 2000  vancomycin (VANCOCIN) IVPB 1000 mg/200 mL premix        1,000 mg 200 mL/hr over 60 Minutes Intravenous Every 12 hours 09/21/21 1629 09/22/21 0847   09/21/21 0730  vancomycin (VANCOCIN) IVPB 1000 mg/200 mL premix        1,000 mg 200 mL/hr over 60 Minutes Intravenous On call to O.R. 09/21/21 0724 09/21/21 1649       Assessment: Pharmacy consulted to start heparin for New onset afib post carotid endart. No DOAC PTA. CHADSVASc > 4. Per MD will start at 1/2 bolus. Monitor Hgb 12.1 > 10.1.   Goal of Therapy:  Heparin level 0.3-0.7 units/ml Monitor platelets by anticoagulation protocol: Yes   9/07 2253 HL 0.44, therapeutic x 1  Plan:  Continue heparin infusion at 850 units/hr Will recheck HL w/ AM labs to confirm  CBC daily while on heparin  Renda Rolls, PharmD, [facility] 09/22/2021 11:30 PM

## 2021-09-22 NOTE — Progress Notes (Signed)
[facility]  Daily Progress Note   Subjective  -   Patient spontaneously went into afib with RVR while I was at bedside discussing discharge.  Confirmed by 12 lead.  Cardiology consulted   Objective Vitals:   09/22/21 1100 09/22/21 1124 09/22/21 1200 09/22/21 1300  BP: 92/72 92/72 (!) 104/94 138/73  Pulse: 72 72 60 (!) 160  Resp: '15 15 11 '$ (!) 21  Temp: 98 F (36.7 C) 98 F (36.7 C)    TempSrc: Axillary     SpO2: 97% 97% 98% 99%  Weight:      Height:        Intake/Output Summary (Last 24 hours) at 09/22/2021 1458 Last data filed at 09/22/2021 0600 Gross per 24 hour  Intake 1041.21 ml  Output 2000 ml  Net -958.79 ml    PULM  CTAB CV  Irregularly Irregular, elevated in HR in 170s VASC  1 + pedal pulses  Laboratory CBC    Component Value Date/Time   WBC 12.7 (H) 09/22/2021 0610   HGB 10.1 (L) 09/22/2021 0610   HGB 13.8 05/31/2020 1007   HCT 31.7 (L) 09/22/2021 0610   HCT 39.8 05/31/2020 1007   PLT 246 09/22/2021 0610   PLT 260 05/31/2020 1007    BMET    Component Value Date/Time   NA 136 09/22/2021 0610   NA 139 05/31/2020 1007   NA 140 03/05/2013 0449   K 5.0 09/22/2021 0610   K 3.9 03/05/2013 0449   CL 106 09/22/2021 0610   CL 107 03/05/2013 0449   CO2 23 09/22/2021 0610   CO2 29 03/05/2013 0449   GLUCOSE 156 (H) 09/22/2021 0610   GLUCOSE 94 03/05/2013 0449   BUN 19 09/22/2021 0610   BUN 25 05/31/2020 1007   BUN 28 (H) 03/05/2013 0449   CREATININE 0.94 09/22/2021 0610   CREATININE 1.02 (H) 09/26/2016 1108   CALCIUM 8.5 (L) 09/22/2021 0610   CALCIUM 8.3 (L) 03/05/2013 0449   GFRNONAA >60 09/22/2021 0610   GFRNONAA 46 (L) 03/05/2013 0449   GFRAA 71 10/23/2017 1340   GFRAA 54 (L) 03/05/2013 0449    Assessment/Planning: POD #1 s/p carotid endarterectomy   Carotid Stenosis  -Patient neurologically intact with notable bruising, however doing well  New Onset Atrial Fibrillation with RVR -Cards consulted -heparin gtt started   -One time cardizem bolus to control HR, however will attempt additional bolus if not well controlled.  May consider cardizem gtt vs. PO cardizem if we can get HR controlled. Continue to welcome cardiology recommendations and guidance  -  Molly Mitchell  09/22/2021, 2:58 PM

## 2021-09-22 NOTE — Consult Note (Signed)
ANTICOAGULATION CONSULT NOTE - Initial Consult  Pharmacy Consult for Heparin  Indication:  New onset afib post carotid endart  Allergies  Allergen Reactions   Amitriptyline Hcl Swelling    Facial swelling   Duricef [Cefadroxil] Swelling    Facial swelling   Levofloxacin Other (See Comments)    Severe Shoulder Pain   Tetanus Toxoid Swelling    Arm swelling   Tramadol Itching   Erythromycin Rash and Other (See Comments)    Rash on bottom    Patient Measurements: Height: '5\' 2"'$  (157.5 cm) Weight: 65.8 kg (145 lb) IBW/kg (Calculated) : 50.1 Heparin Dosing Weight: 63.6 kg  Vital Signs: Temp: 98 F (36.7 C) (09/07 1124) Temp Source: Axillary (09/07 1100) BP: 138/73 (09/07 1300) Pulse Rate: 160 (09/07 1300)  Labs: Recent Labs    09/22/21 0610  HGB 10.1*  HCT 31.7*  PLT 246  CREATININE 0.94    Estimated Creatinine Clearance: 46 mL/min (by C-G formula based on SCr of 0.94 mg/dL).   Medical History: Past Medical History:  Diagnosis Date   Acute right MCA stroke ([place]) 08/01/2021   a.) noted on MRI head 08/01/2021 --> punctate acute RIGHT MCA territory infarcts affecting the frontal and parietal contex consistent with microembolic infarcts in the RIGHT carotid circulation   Anemia    Arthritis    Bilateral carotid artery disease ([place]) 08/18/2021   a.) CTA neck 08/18/2021: 19-14% RICA and 78% LICA   Cervicalgia    Chronic kidney disease    Chronic pain syndrome    Chronic, continuous use of opioids    a.) hydrocodone/APAP (Norco 10/325 mg)   Connective tissue disease ([place])    pt reports being diagnosed with undifferentiated connective tissue disease at some point- pt took steroids for this and has had no issues since- 10 years ago   Coronary artery disease    Cortical senile cataract    DDD (degenerative disc disease), cervical    a.) s/p C4-C6 ACDF   DDD (degenerative disc disease), lumbar    a.) s/p laminectomy + posterior L2-S1 fusion   History of 2019 novel  coronavirus disease (COVID-19) 12/07/2018   History of blood transfusion    History of heart murmur in childhood    History of rheumatic fever    HLD (hyperlipidemia)    Hypertension    Lower extremity weakness    a.) with position changes from sitting to standing   Osteoporosis    Pseudogout    Shingles    Skin cancer of face    Spondylolisthesis of lumbar region     Medications:  Medications Prior to Admission  Medication Sig Dispense Refill Last Dose   amLODipine (NORVASC) 2.5 MG tablet Take 2.5 mg by mouth in the morning and at bedtime.   09/21/2021   amoxicillin (AMOXIL) 500 MG capsule Take 4 tablets by mouth 1 hour prior to dental procedures. 12 capsule 3 Past Month   ASPIRIN LOW DOSE 81 MG tablet Take 81 mg by mouth daily.   Past Week   carvedilol (COREG) 3.125 MG tablet Take 3.125 mg by mouth 2 (two) times daily with a meal.   09/21/2021   cyclobenzaprine (FLEXERIL) 5 MG tablet Take 1 tablet (5 mg total) by mouth 3 (three) times daily as needed for muscle spasms. 50 tablet 0 Past Week   HYDROcodone-acetaminophen (NORCO) 10-325 MG tablet Take 1 tablet by mouth every 6 (six) hours as needed.   09/21/2021   pravastatin (PRAVACHOL) 40 MG tablet Take 40 mg  by mouth at bedtime.   09/20/2021   telmisartan (MICARDIS) 80 MG tablet Take 80 mg by mouth daily.   09/20/2021   clopidogrel (PLAVIX) 75 MG tablet Take 75 mg by mouth daily. (Patient not taking: Reported on 08/22/2021)      Scheduled:   aspirin EC  81 mg Oral Daily   carvedilol  3.125 mg Oral BID WC   docusate sodium  100 mg Oral Daily   pantoprazole (PROTONIX) IV  40 mg Intravenous QHS   pravastatin  40 mg Oral QHS   Infusions:   sodium chloride     dextrose 5 % and 0.9 % NaCl with KCl 20 mEq/L Stopped (09/22/21 0525)   PRN: sodium chloride, acetaminophen **OR** acetaminophen, alum & mag hydroxide-simeth, guaiFENesin-dextromethorphan, hydrALAZINE, labetalol, LORazepam, metoprolol tartrate, morphine injection, ondansetron (ZOFRAN)  IV, oxyCODONE, phenol Anti-infectives (From admission, onward)    Start     Dose/Rate Route Frequency Ordered Stop   09/21/21 2000  vancomycin (VANCOCIN) IVPB 1000 mg/200 mL premix        1,000 mg 200 mL/hr over 60 Minutes Intravenous Every 12 hours 09/21/21 1629 09/22/21 0847   09/21/21 0730  vancomycin (VANCOCIN) IVPB 1000 mg/200 mL premix        1,000 mg 200 mL/hr over 60 Minutes Intravenous On call to O.R. 09/21/21 0724 09/21/21 1649       Assessment: Pharmacy consulted to start heparin for New onset afib post carotid endart. No DOAC PTA. CHADSVASc > 4. Per MD will start at 1/2 bolus. Monitor Hgb 12.1 > 10.1.   Goal of Therapy:  Heparin level 0.3-0.7 units/ml Monitor platelets by anticoagulation protocol: Yes   Plan:  Give 1500 units bolus x 1 Start heparin infusion at 850 units/hr Check anti-Xa level in 8 hours and daily while on heparin Continue to monitor H&H and platelets  Oswald Hillock, PharmD, BCPS 09/22/2021,2:09 PM

## 2021-09-22 NOTE — TOC Initial Note (Signed)
Transition of Care [facility]) - Initial/Assessment Note    Patient Details  Name: Molly Mitchell MRN: [id number] Date of Birth: 12-07-1945  Transition of Care [facility]) CM/SW Contact:    Shelbie Hutching, RN Phone Number: 09/22/2021, 6:24 PM  Clinical Narrative:                   Transition of Care [facility]) Screening Note   Patient Details  Name: Molly Mitchell Date of Birth: [birth year]   Transition of Care [facility]) CM/SW Contact:    Shelbie Hutching, RN Phone Number: 09/22/2021, 6:24 PM    Transition of Care Department [facility]) has reviewed patient and no TOC needs have been identified at this time. We will continue to monitor patient advancement through interdisciplinary progression rounds. If new patient transition needs arise, please place a TOC consult.         Patient Goals and CMS Choice        Expected Discharge Plan and Services                                                Prior Living Arrangements/Services                       Activities of Daily Living Home Assistive Devices/Equipment: Cane (specify quad or straight) ADL Screening (condition at time of admission) Patient's cognitive ability adequate to safely complete daily activities?: Yes Is the patient deaf or have difficulty hearing?: No Does the patient have difficulty seeing, even when wearing glasses/contacts?: No Does the patient have difficulty concentrating, remembering, or making decisions?: No Patient able to express need for assistance with ADLs?: Yes Does the patient have difficulty dressing or bathing?: No Independently performs ADLs?: Yes (appropriate for developmental age) Does the patient have difficulty walking or climbing stairs?: No Weakness of Legs: None Weakness of Arms/Hands: None  Permission Sought/Granted                  Emotional Assessment              Admission diagnosis:  Carotid stenosis, asymptomatic [I65.29] Patient Active Problem List   Diagnosis  Date Noted   Carotid stenosis, asymptomatic 09/21/2021   DJD (degenerative joint disease) of cervical spine 08/23/2021   GI bleed 08/23/2021   Symptomatic carotid artery stenosis with infarction ([place]) 08/15/2021   Carotid stenosis 08/14/2021   Lumbar adjacent segment disease with spondylolisthesis 06/02/2021   Left ventricular systolic dysfunction 13/08/6576   Acute right-sided low back pain with right-sided sciatica 06/11/2019   Fracture of sacrum ([place]) 05/15/2017   CKD (chronic kidney disease) stage 3, GFR 30-59 ml/min ([place]) 02/01/2017   Hypokalemia 02/01/2017   Spondylolisthesis of lumbar region 12/18/2016   Spinal stenosis of lumbar region 10/23/2016   S/P total knee arthroplasty 07/31/2016   Bradycardia 12/20/2015   Encounter for Medicare annual wellness exam 07/27/2015   Acute bronchitis 05/13/2014   Dysfunction of eustachian tube 05/13/2014   Cardiac conduction disorder 05/13/2014   Ache in joint 05/13/2014   AB (asthmatic bronchitis) 05/13/2014   Gonalgia 05/13/2014   Asthma, cough variant 05/13/2014   Screening for depression 05/13/2014   Electrolyte imbalance 05/13/2014   Fatigue 05/13/2014   H/O total knee replacement 05/13/2014   HLD (hyperlipidemia) 05/13/2014   Elevated WBC count 05/13/2014   Drug withdrawal syndrome to  opium ([place]) 05/13/2014   Arthritis of knee, degenerative 05/13/2014   Osteopenia 05/13/2014   Abdominal pain, right upper quadrant 05/13/2014   Herpes zona 05/13/2014   Periapical abscess without sinus tract 05/13/2014   Pain in thoracic spine 05/13/2014   MI (mitral incompetence) 12/29/2013   Idiopathic insomnia 10/16/2013   Polypharmacy 10/16/2013   Dizziness 07/29/2013   Long term current use of systemic steroids 07/21/2013   History of prolonged Q-T interval on ECG 05/22/2013   Benign essential HTN 05/22/2013   Personal history of other diseases of the circulatory system 05/22/2013   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  04/21/2013   Combined fat and carbohydrate induced hyperlipemia 03/31/2013   S/P lumbar spine operation 06/20/2012   Cervical post-laminectomy syndrome 06/20/2012   Status post lumbar spine operation 06/20/2012   Tumoral calcinosis 10/31/2011   Connective tissue disease, undifferentiated ([place]) 10/31/2011   Effusion of knee 10/31/2011   Chondrocalcinosis due to dicalcium phosphate crystals 10/31/2011   Undifferentiated connective tissue disease ([place]) 10/31/2011   Chronic cervical pain 03/28/2011   Arthralgia of ankle or foot 11/30/2010   Neoplasm of breast 10/26/2008   Acne 07/24/2008   Dizziness and giddiness 07/24/2008   Cervical pain 05/12/2008   Dyssomnia 05/12/2008   Essential (primary) hypertension 05/12/2008   PCP:  [place]:   [id number] [name], [address] [place] [address] Phone: [phone] Fax: [place], [address] [place] [address] Phone: [phone] Fax: [phone]  [facility] [place] [address] [employer] [address] [place] [address] Phone: [phone] Fax: [phone]     Social Determinants of Health (SDOH) Interventions    Readmission Risk Interventions     No data to display

## 2021-09-23 LAB — CBC
HCT: 29.9 % — ABNORMAL LOW (ref 36.0–46.0)
Hemoglobin: 9.6 g/dL — ABNORMAL LOW (ref 12.0–15.0)
MCH: 29.5 pg (ref 26.0–34.0)
MCHC: 32.1 g/dL (ref 30.0–36.0)
MCV: 92 fL (ref 80.0–100.0)
Platelets: 224 10*3/uL (ref 150–400)
RBC: 3.25 MIL/uL — ABNORMAL LOW (ref 3.87–5.11)
RDW: 16 % — ABNORMAL HIGH (ref 11.5–15.5)
WBC: 11.9 10*3/uL — ABNORMAL HIGH (ref 4.0–10.5)
nRBC: 0 % (ref 0.0–0.2)

## 2021-09-23 LAB — HEPARIN LEVEL (UNFRACTIONATED): Heparin Unfractionated: 0.55 IU/mL (ref 0.30–0.70)

## 2021-09-23 MED ORDER — CARVEDILOL 3.125 MG PO TABS
3.1250 mg | ORAL_TABLET | Freq: Two times a day (BID) | ORAL | Status: DC
  Administered 2021-09-23: 3.125 mg via ORAL
  Filled 2021-09-23: qty 1

## 2021-09-23 NOTE — Discharge Instructions (Signed)
Hold amplodopine medication until follow up appointment with Cardiology.

## 2021-09-23 NOTE — Consult Note (Signed)
ANTICOAGULATION CONSULT NOTE   Pharmacy Consult for Heparin  Indication:  New onset afib post carotid endart  Allergies  Allergen Reactions   Amitriptyline Hcl Swelling    Facial swelling   Duricef [Cefadroxil] Swelling    Facial swelling   Levofloxacin Other (See Comments)    Severe Shoulder Pain   Tetanus Toxoid Swelling    Arm swelling   Tramadol Itching   Erythromycin Rash and Other (See Comments)    Rash on bottom    Patient Measurements: Height: '5\' 2"'$  (157.5 cm) Weight: 65.8 kg (145 lb) IBW/kg (Calculated) : 50.1 Heparin Dosing Weight: 63.6 kg  Vital Signs: Temp: 97.6 F (36.4 C) (09/08 0400) Temp Source: Oral (09/08 0400) BP: 98/49 (09/08 0600) Pulse Rate: 52 (09/08 0600)  Labs: Recent Labs    09/22/21 0610 09/22/21 2253 09/23/21 0407  HGB 10.1*  --  9.6*  HCT 31.7*  --  29.9*  PLT 246  --  224  HEPARINUNFRC  --  0.44 0.55  CREATININE 0.94  --   --      Estimated Creatinine Clearance: 46 mL/min (by C-G formula based on SCr of 0.94 mg/dL).   Medical History: Past Medical History:  Diagnosis Date   Acute right MCA stroke ([place]) 08/01/2021   a.) noted on MRI head 08/01/2021 --> punctate acute RIGHT MCA territory infarcts affecting the frontal and parietal contex consistent with microembolic infarcts in the RIGHT carotid circulation   Anemia    Arthritis    Bilateral carotid artery disease ([place]) 08/18/2021   a.) CTA neck 08/18/2021: 53-64% RICA and 68% LICA   Cervicalgia    Chronic kidney disease    Chronic pain syndrome    Chronic, continuous use of opioids    a.) hydrocodone/APAP (Norco 10/325 mg)   Connective tissue disease ([place])    pt reports being diagnosed with undifferentiated connective tissue disease at some point- pt took steroids for this and has had no issues since- 10 years ago   Coronary artery disease    Cortical senile cataract    DDD (degenerative disc disease), cervical    a.) s/p C4-C6 ACDF   DDD (degenerative disc disease),  lumbar    a.) s/p laminectomy + posterior L2-S1 fusion   History of 2019 novel coronavirus disease (COVID-19) 12/07/2018   History of blood transfusion    History of heart murmur in childhood    History of rheumatic fever    HLD (hyperlipidemia)    Hypertension    Lower extremity weakness    a.) with position changes from sitting to standing   Osteoporosis    Pseudogout    Shingles    Skin cancer of face    Spondylolisthesis of lumbar region     Medications:  Medications Prior to Admission  Medication Sig Dispense Refill Last Dose   amLODipine (NORVASC) 2.5 MG tablet Take 2.5 mg by mouth in the morning and at bedtime.   09/21/2021   amoxicillin (AMOXIL) 500 MG capsule Take 4 tablets by mouth 1 hour prior to dental procedures. 12 capsule 3 Past Month   ASPIRIN LOW DOSE 81 MG tablet Take 81 mg by mouth daily.   Past Week   carvedilol (COREG) 3.125 MG tablet Take 3.125 mg by mouth 2 (two) times daily with a meal.   09/21/2021   cyclobenzaprine (FLEXERIL) 5 MG tablet Take 1 tablet (5 mg total) by mouth 3 (three) times daily as needed for muscle spasms. 50 tablet 0 Past Week   HYDROcodone-acetaminophen (  NORCO) 10-325 MG tablet Take 1 tablet by mouth every 6 (six) hours as needed.   09/21/2021   pravastatin (PRAVACHOL) 40 MG tablet Take 40 mg by mouth at bedtime.   09/20/2021   telmisartan (MICARDIS) 80 MG tablet Take 80 mg by mouth daily.   09/20/2021   clopidogrel (PLAVIX) 75 MG tablet Take 75 mg by mouth daily. (Patient not taking: Reported on 08/22/2021)      Scheduled:   aspirin EC  81 mg Oral Daily   Chlorhexidine Gluconate Cloth  6 each Topical Daily   diltiazem  240 mg Oral Daily   docusate sodium  100 mg Oral Daily   metoprolol tartrate  25 mg Oral BID   pantoprazole (PROTONIX) IV  40 mg Intravenous QHS   pravastatin  40 mg Oral QHS   Infusions:   sodium chloride     dextrose 5 % and 0.9 % NaCl with KCl 20 mEq/L Stopped (09/22/21 0525)   heparin 850 Units/hr (09/23/21 0600)   PRN:  sodium chloride, acetaminophen **OR** acetaminophen, alum & mag hydroxide-simeth, guaiFENesin-dextromethorphan, hydrALAZINE, labetalol, LORazepam, metoprolol tartrate, morphine injection, ondansetron (ZOFRAN) IV, oxyCODONE, phenol, polyethylene glycol Anti-infectives (From admission, onward)    Start     Dose/Rate Route Frequency Ordered Stop   09/21/21 2000  vancomycin (VANCOCIN) IVPB 1000 mg/200 mL premix        1,000 mg 200 mL/hr over 60 Minutes Intravenous Every 12 hours 09/21/21 1629 09/22/21 0847   09/21/21 0730  vancomycin (VANCOCIN) IVPB 1000 mg/200 mL premix        1,000 mg 200 mL/hr over 60 Minutes Intravenous On call to O.R. 09/21/21 0724 09/21/21 1649       Assessment: Pharmacy consulted to start heparin for New onset afib post carotid endart. No DOAC PTA. CHADSVASc > 4. Per MD will start at 1/2 bolus. Monitor Hgb 12.1 > 10.1.   Goal of Therapy:  Heparin level 0.3-0.7 units/ml Monitor platelets by anticoagulation protocol: Yes   9/07 2253 HL 0.44, therapeutic x 1 9/08 0407 HL 0.55, therapeutic x 2  Plan:  Continue heparin infusion at 850 units/hr Will recheck HL w/ AM labs daily while therapeutic CBC daily while on heparin  Renda Rolls, PharmD, [facility] 09/23/2021 6:42 AM

## 2021-09-23 NOTE — Progress Notes (Signed)
Patient alert and oriented, sinus brady/normal sinus, on room air. Neck incision clean, dry, with bruising. Patient ambulated in room and on unit, independently. Tolerated well.Patient belongings returned, patient education and medication instruction provided.Patient to make follow up vascular appointment. IVs removed, patient discharged.

## 2021-09-23 NOTE — Discharge Summary (Signed)
[facility]    Discharge Summary    Patient ID:  Molly Mitchell MRN: [id number] DOB/AGE: 1945/05/13 76 y.o.  Admit date: 09/21/2021 Discharge date: 09/23/2021 Date of Surgery: 09/21/2021 Surgeon: Surgeon(s): Schnier, Dolores Lory, MD  Admission Diagnosis: Carotid stenosis, asymptomatic [I65.29]  Discharge Diagnoses:  Carotid stenosis, asymptomatic [I65.29]  Secondary Diagnoses: Past Medical History:  Diagnosis Date   Acute right MCA stroke ([place]) 08/01/2021   a.) noted on MRI head 08/01/2021 --> punctate acute RIGHT MCA territory infarcts affecting the frontal and parietal contex consistent with microembolic infarcts in the RIGHT carotid circulation   Anemia    Arthritis    Bilateral carotid artery disease ([place]) 08/18/2021   a.) CTA neck 08/18/2021: 73-41% RICA and 93% LICA   Cervicalgia    Chronic kidney disease    Chronic pain syndrome    Chronic, continuous use of opioids    a.) hydrocodone/APAP (Norco 10/325 mg)   Connective tissue disease ([place])    pt reports being diagnosed with undifferentiated connective tissue disease at some point- pt took steroids for this and has had no issues since- 10 years ago   Coronary artery disease    Cortical senile cataract    DDD (degenerative disc disease), cervical    a.) s/p C4-C6 ACDF   DDD (degenerative disc disease), lumbar    a.) s/p laminectomy + posterior L2-S1 fusion   History of 2019 novel coronavirus disease (COVID-19) 12/07/2018   History of blood transfusion    History of heart murmur in childhood    History of rheumatic fever    HLD (hyperlipidemia)    Hypertension    Lower extremity weakness    a.) with position changes from sitting to standing   Osteoporosis    Pseudogout    Shingles    Skin cancer of face    Spondylolisthesis of lumbar region     Procedure(s): ENDARTERECTOMY CAROTID  Discharged Condition: good  HPI:  Molly Mitchell is a 76 year old female who presented to [facility] on 09/21/2021 with a critical carotid stenosis of 90%.  The patient underwent a right carotid endarterectomy.  The patient underwent the procedure without any significant complications.  On the initial postop day the patient felt well, had ambulated without difficulty and was eating solid foods without issue.  She spontaneously developed a witnessed atrial fibrillation with RVR.  The patient was started on heparin, as well as given 2 doses of Cardizem 10 mg and 240 mg CD of Cardizem.  After approximately 5 hours she spontaneously converted to normal sinus rhythm.  Today, the patient is still in normal sinus rhythm.  She denies any chest pain or shortness of breath.  She continues to deny any issues with swallowing.  The patient previously had issues with melena when placed on DAPT therapy previously.  Following consultation with cardiology it was felt that anticoagulation was not needed in this case because the patient was immediately placed on anticoagulation after onset and spontaneously converted.  Based on this patient will be discharged on aspirin alone.  She will follow-up in office with [place] in 3 to 4 weeks and will see cardiology in approximately 1 week.  Hospital Course:  Molly Mitchell is a 76 y.o. female is S/P Right Carotid Endarterectomy  Procedure(s): ENDARTERECTOMY CAROTID Extubated: POD # 0 Physical exam: Bruising at incision, otherwise intact.  No neurological deficits Post-op wounds clean, dry, intact or healing well Pt. Ambulating, voiding and taking PO diet  without difficulty. Pt pain controlled with PO pain meds. Labs as below Complications:Atrial Fibrillation   Consults: Cardiology (Dr. Nehemiah Massed)   Significant Diagnostic Studies: CBC Lab Results  Component Value Date   WBC 11.9 (H) 09/23/2021   HGB 9.6 (L) 09/23/2021   HCT 29.9 (L) 09/23/2021   MCV 92.0 09/23/2021   PLT 224 09/23/2021    BMET    Component Value Date/Time   NA 136 09/22/2021  0610   NA 139 05/31/2020 1007   NA 140 03/05/2013 0449   K 5.0 09/22/2021 0610   K 3.9 03/05/2013 0449   CL 106 09/22/2021 0610   CL 107 03/05/2013 0449   CO2 23 09/22/2021 0610   CO2 29 03/05/2013 0449   GLUCOSE 156 (H) 09/22/2021 0610   GLUCOSE 94 03/05/2013 0449   BUN 19 09/22/2021 0610   BUN 25 05/31/2020 1007   BUN 28 (H) 03/05/2013 0449   CREATININE 0.94 09/22/2021 0610   CREATININE 1.02 (H) 09/26/2016 1108   CALCIUM 8.5 (L) 09/22/2021 0610   CALCIUM 8.3 (L) 03/05/2013 0449   GFRNONAA >60 09/22/2021 0610   GFRNONAA 46 (L) 03/05/2013 0449   GFRAA 71 10/23/2017 1340   GFRAA 54 (L) 03/05/2013 0449   COAG Lab Results  Component Value Date   INR 0.96 07/17/2016   INR 0.9 02/19/2013     Disposition:  Discharge to :Home  Allergies as of 09/23/2021       Reactions   Amitriptyline Hcl Swelling   Facial swelling   Duricef [cefadroxil] Swelling   Facial swelling   Levofloxacin Other (See Comments)   Severe Shoulder Pain   Tetanus Toxoid Swelling   Arm swelling   Tramadol Itching   Erythromycin Rash, Other (See Comments)   Rash on bottom        Medication List     STOP taking these medications    clopidogrel 75 MG tablet Commonly known as: PLAVIX   cyclobenzaprine 5 MG tablet Commonly known as: FLEXERIL       TAKE these medications    amLODipine 2.5 MG tablet Commonly known as: NORVASC Take 2.5 mg by mouth in the morning and at bedtime.   amoxicillin 500 MG capsule Commonly known as: AMOXIL Take 4 tablets by mouth 1 hour prior to dental procedures.   Aspirin Low Dose 81 MG tablet Generic drug: aspirin EC Take 81 mg by mouth daily.   carvedilol 3.125 MG tablet Commonly known as: COREG Take 3.125 mg by mouth 2 (two) times daily with a meal.   HYDROcodone-acetaminophen 10-325 MG tablet Commonly known as: NORCO Take 1 tablet by mouth every 6 (six) hours as needed.   pravastatin 40 MG tablet Commonly known as: PRAVACHOL Take 40 mg by mouth  at bedtime.   telmisartan 80 MG tablet Commonly known as: MICARDIS Take 80 mg by mouth daily.       Verbal and written Discharge instructions given to the patient. Wound care per Discharge AVS  Follow-up Information     Corey Skains, MD. Go in 1 week(s).   Specialty: Cardiology Contact information: [facility] [place] [address] [phone]         Delana Meyer, Dolores Lory, MD Follow up in 3 week(s).   Specialties: Vascular Surgery, Cardiology, Radiology, Vascular Surgery Why: See GS/FB in3-4 weeks with carotid duplex Contact information: [place] [address] [phone]  Signed: Kris Hartmann, NP  09/23/2021, 8:47 AM

## 2021-09-23 NOTE — Progress Notes (Addendum)
[place] NOTE       Patient ID: Molly Mitchell MRN: [id number] DOB/AGE: 03/12/1945 76 y.o.  Admit date: 09/21/2021 Referring Physician Eulogio Ditch, NP  Primary Physician Dr. Gladstone Lighter  Primary Cardiologist Dr. Nehemiah Massed   Reason for Consultation AF RVR   HPI: Molly Mitchell is a 76yoF with a PMH of HFpEF (EF 55%, g1dd 09/20/2021), hypertension, h/o R MCA CVA 07/2021, chronic low back pain, bilateral carotid artery stenosis who presented to [facility] 09/21/2021 for and elective right carotid endarterectomy with Dr. Delana Meyer.  The patient tolerated the procedure without untoward event. On 9/7 she developed atrial fibrillation with RVR (new in onset), for which cardiology is consulted. Spontaneously converted to NSR after ~ 5 hours on 9/7.   Interval History: -converted to NSR around 1700 on 9/7, duration of atrial fibrillation < 24 hours -notes a sore throat, but no chest pain, shortness of breath, palpitations, overall feels better than yesterday  Review of systems complete and found to be negative unless listed above     Past Medical History:  Diagnosis Date   Acute right MCA stroke ([place]) 08/01/2021   a.) noted on MRI head 08/01/2021 --> punctate acute RIGHT MCA territory infarcts affecting the frontal and parietal contex consistent with microembolic infarcts in the RIGHT carotid circulation   Anemia    Arthritis    Bilateral carotid artery disease ([place]) 08/18/2021   a.) CTA neck 08/18/2021: 51-88% RICA and 41% LICA   Cervicalgia    Chronic kidney disease    Chronic pain syndrome    Chronic, continuous use of opioids    a.) hydrocodone/APAP (Norco 10/325 mg)   Connective tissue disease ([place])    pt reports being diagnosed with undifferentiated connective tissue disease at some point- pt took steroids for this and has had no issues since- 10 years ago   Coronary artery disease    Cortical senile cataract    DDD (degenerative disc disease), cervical    a.) s/p  C4-C6 ACDF   DDD (degenerative disc disease), lumbar    a.) s/p laminectomy + posterior L2-S1 fusion   History of 2019 novel coronavirus disease (COVID-19) 12/07/2018   History of blood transfusion    History of heart murmur in childhood    History of rheumatic fever    HLD (hyperlipidemia)    Hypertension    Lower extremity weakness    a.) with position changes from sitting to standing   Osteoporosis    Pseudogout    Shingles    Skin cancer of face    Spondylolisthesis of lumbar region     Past Surgical History:  Procedure Laterality Date   ABDOMINAL HYSTERECTOMY  1981   AIKEN OSTEOTOMY Right 01/30/2018   Procedure: WEIL RIGHT 2ND AND 3RD;  Surgeon: [name], DPM;  Location: [place];  Service: Podiatry;  Laterality: Right;   ANTERIOR CERVICAL DECOMP/DISCECTOMY FUSION N/A 2003   C4-C6   BACK SURGERY     pt reports hx of 3 lumber back surgeries total   CERVICAL SPINE SURGERY     pt reports having 3 cervical fusions total   ENDARTERECTOMY Right 09/21/2021   Procedure: ENDARTERECTOMY CAROTID;  Surgeon: Katha Cabal, MD;  Location: [facility];  Service: Vascular;  Laterality: Right;  Right carotid   HAMMER TOE SURGERY Right 01/30/2018   Procedure: HAMMER TOE CORRECTION SECOND AND THIRD;  Surgeon: [name], DPM;  Location: [place];  Service: Podiatry;  Laterality: Right;  GENERAL WITH  LOCAL   HAMMER TOE SURGERY Left 03/05/2019   Procedure: HAMMER TOE CORRECTION T1 AND T2;  Surgeon: [name], DPM;  Location: [place];  Service: Podiatry;  Laterality: Left;   POSTERIOR LUMBAR FUSION N/A 2001   L2-S1   ROTATOR CUFF REPAIR Right 1981   SACROPLASTY N/A 03/29/2017   S1   TOTAL KNEE ARTHROPLASTY Left 03/03/2013   TOTAL KNEE REVISION Left 07/31/2016   Procedure: TOTAL KNEE REVISION;  Surgeon: Dereck Leep, MD;  Location: [facility];  Service: Orthopedics;  Laterality: Left;   WEIL OSTEOTOMY Left 03/05/2019   Procedure: WEIL  OSTEOTOMY X 2 LEFT;  Surgeon: [name], DPM;  Location: [place];  Service: Podiatry;  Laterality: Left;    Medications Prior to Admission  Medication Sig Dispense Refill Last Dose   amLODipine (NORVASC) 2.5 MG tablet Take 2.5 mg by mouth in the morning and at bedtime.   09/21/2021   amoxicillin (AMOXIL) 500 MG capsule Take 4 tablets by mouth 1 hour prior to dental procedures. 12 capsule 3 Past Month   ASPIRIN LOW DOSE 81 MG tablet Take 81 mg by mouth daily.   Past Week   carvedilol (COREG) 3.125 MG tablet Take 3.125 mg by mouth 2 (two) times daily with a meal.   09/21/2021   cyclobenzaprine (FLEXERIL) 5 MG tablet Take 1 tablet (5 mg total) by mouth 3 (three) times daily as needed for muscle spasms. 50 tablet 0 Past Week   HYDROcodone-acetaminophen (NORCO) 10-325 MG tablet Take 1 tablet by mouth every 6 (six) hours as needed.   09/21/2021   pravastatin (PRAVACHOL) 40 MG tablet Take 40 mg by mouth at bedtime.   09/20/2021   telmisartan (MICARDIS) 80 MG tablet Take 80 mg by mouth daily.   09/20/2021   clopidogrel (PLAVIX) 75 MG tablet Take 75 mg by mouth daily. (Patient not taking: Reported on 08/22/2021)      Social History   Socioeconomic History   Marital status: Married    Spouse name: Eddie Dibbles   Number of children: 2   Years of education: Not on file   Highest education level: High school graduate  Occupational History   Occupation: retired  Tobacco Use   Smoking status: Former    Types: Cigarettes    Quit date: 04/17/1979    Years since quitting: 42.4   Smokeless tobacco: Never  Vaping Use   Vaping Use: Never used  Substance and Sexual Activity   Alcohol use: No    Alcohol/week: 0.0 standard drinks of alcohol   Drug use: No   Sexual activity: Not on file  Other Topics Concern   Not on file  Social History Narrative   Not on file   Social Determinants of Health   Financial Resource Strain: Low Risk  (03/23/2020)   Overall Financial Resource Strain (CARDIA)    Difficulty  of Paying Living Expenses: Not hard at all  Food Insecurity: No Food Insecurity (09/21/2021)   Hunger Vital Sign    Worried About Running Out of Food in the Last Year: Never true    [place] in the Last Year: Never true  Transportation Needs: No Transportation Needs (09/21/2021)   PRAPARE - Hydrologist (Medical): No    Lack of Transportation (Non-Medical): No  Physical Activity: Sufficiently Active (03/23/2020)   Exercise Vital Sign    Days of Exercise per Week: 3 days    Minutes of Exercise per Session: 60 min  Stress: No Stress Concern Present (03/23/2020)   [place]    Feeling of Stress : Not at all  Social Connections: [place] (03/23/2020)   Social Connection and Isolation Panel [NHANES]    Frequency of Communication with Friends and Family: Never    Frequency of Social Gatherings with Friends and Family: More than three times a week    Attends Religious Services: More than 4 times per year    Active Member of [employer] or Organizations: Yes    Attends Music therapist: More than 4 times per year    Marital Status: Married  Human resources officer Violence: Not At Risk (09/21/2021)   Humiliation, Afraid, Rape, and Kick questionnaire    Fear of Current or Ex-Partner: No    Emotionally Abused: No    Physically Abused: No    Sexually Abused: No    Family History  Problem Relation Age of Onset   Hypertension Brother    Hypertension Mother    Hypertension Sister    Cancer Sister    Diabetes Paternal Grandmother    Heart failure Maternal Grandmother      PHYSICAL EXAM General: Pleasant elderly Caucasian female, well nourished, in no acute distress.  Sitting upright in ICU bed  HEENT:  Normocephalic and atraumatic. Neck:  No JVD.  Expected postsurgical ecchymosis to her right neck Lungs: Normal respiratory effort on room air. Clear bilaterally to auscultation. No  wheezes, crackles, rhonchi.  Heart: Regular rate and rhythm. Normal S1 and S2 without gallops or murmurs.  Abdomen: Non-distended appearing.  Msk: Normal strength and tone for age. Extremities: Warm and well perfused. No clubbing, cyanosis.  No peripheral edema.  Neuro: Alert and oriented X 3. Psych: Answers questions appropriately.   Labs: Basic Metabolic Panel: Recent Labs    09/22/21 0610 09/22/21 1417  NA 136  --   K 5.0  --   CL 106  --   CO2 23  --   GLUCOSE 156*  --   BUN 19  --   CREATININE 0.94  --   CALCIUM 8.5*  --   MG  --  2.4    Liver Function Tests: No results for input(s): "AST", "ALT", "ALKPHOS", "BILITOT", "PROT", "ALBUMIN" in the last 72 hours. No results for input(s): "LIPASE", "AMYLASE" in the last 72 hours. CBC: Recent Labs    09/22/21 0610 09/23/21 0407  WBC 12.7* 11.9*  HGB 10.1* 9.6*  HCT 31.7* 29.9*  MCV 91.1 92.0  PLT 246 224    Cardiac Enzymes: No results for input(s): "CKTOTAL", "CKMB", "CKMBINDEX", "TROPONINIHS" in the last 72 hours. BNP: Invalid input(s): "POCBNP" D-Dimer: No results for input(s): "DDIMER" in the last 72 hours. Hemoglobin A1C: No results for input(s): "HGBA1C" in the last 72 hours. Fasting Lipid Panel: No results for input(s): "CHOL", "HDL", "LDLCALC", "TRIG", "CHOLHDL", "LDLDIRECT" in the last 72 hours. Thyroid Function Tests: Recent Labs    09/22/21 1417  TSH 0.410   Anemia Panel: No results for input(s): "VITAMINB12", "FOLATE", "FERRITIN", "TIBC", "IRON", "RETICCTPCT" in the last 72 hours.  No results found.   Radiology: No results found.  ECHO 09/20/2021 ECHOCARDIOGRAPHIC MEASUREMENTS  2D DIMENSIONS  AORTA                  Values   Normal Range   MAIN PA         Values    Normal Range  Annulus: nm*          [2.1-2.5]         PA Main: nm*       [1.5-2.1]              Aorta Sin: nm*          [2.7-3.3]    RIGHT VENTRICLE            ST Junction: nm*          [2.3-2.9]         RV Base:  3.7 cm    [<4.2]              Asc.Aorta: nm*          [2.3-3.1]          RV Mid: nm*       [<3.5]  LEFT VENTRICLE                                      RV Length: nm*       [<8.6]                  LVIDd: 4.2 cm       [3.9-5.3]    INFERIOR VENA CAVA                  LVIDs: 3.0 cm                        Max. IVC: nm*       [<=2.1]                     FS: 29.4 %       [>25]            Min. IVC: nm*                    SWT: 0.72 cm      [0.5-0.9]    ------------------                    PWT: 0.82 cm      [0.5-0.9]    nm* - not measured  LEFT ATRIUM                LA Diam: 3.6 cm       [2.7-3.8]            LA A4C Area: nm*          [<20]              LA Volume: nm*          [22-52]  _________________________________________________________________________________________  ECHOCARDIOGRAPHIC DESCRIPTIONS  AORTIC ROOT                   Size: Normal             Dissection: INDETERM FOR DISSECTION  AORTIC VALVE               Leaflets: Tricuspid                   Morphology: Normal               Mobility: Fully mobile  LEFT VENTRICLE                   Size: Normal  Anterior: Normal            Contraction: Normal                         Lateral: Normal             Closest EF: >55% (Estimated)                Septal: Normal              LV Masses: No Masses                       Apical: Normal                    LVH: None                          Inferior: Normal                                                      Posterior: Normal           Dias.FxClass: (Grade 1) relaxation abnormal, E/A reversal  MITRAL VALVE               Leaflets: Normal                        Mobility: Fully mobile             Morphology: Normal  LEFT ATRIUM                   Size: Normal                       LA Masses: No masses              IA Septum: Normal IAS  MAIN PA                   Size: Normal  PULMONIC VALVE             Morphology: Normal                        Mobility: Fully mobile   RIGHT VENTRICLE              RV Masses: No Masses                         Size: Normal              Free Wall: Normal                     Contraction: Normal  TRICUSPID VALVE               Leaflets: Normal                        Mobility: Fully mobile             Morphology: Normal  RIGHT ATRIUM                   Size: Normal  RA Other: None                RA Mass: No masses  PERICARDIUM                  Fluid: No effusion  INFERIOR VENACAVA                   Size: Normal Normal respiratory collapse  _________________________________________________________________________________________   DOPPLER ECHO and OTHER SPECIAL PROCEDURES                 Aortic: TRIVIAL AR                 No AS                 Mitral: TRIVIAL MR                 No MS                         MV Inflow E Vel = 95.4 cm/sec       MV Annulus E'Vel = 11.7 cm/sec                         E/E'Ratio = 8.2              Tricuspid: MILD TR                    No TS                         279.0 cm/sec peak TR vel   34.1 mmHg peak RV pressure              Pulmonary: TRIVIAL PR                 No PS  _________________________________________________________________________________________  INTERPRETATION  NORMAL LEFT VENTRICULAR SYSTOLIC FUNCTION  NORMAL RIGHT VENTRICULAR SYSTOLIC FUNCTION  MILD VALVULAR REGURGITATION (See above)  NO VALVULAR STENOSIS  No embolic source seen  _________________________________________________________________________________________  Electronically signed by      Rusty Aus, MD on 09/21/2021 07: 34 AM           Performed By: Scherrie November, RCS     Ordering Physician: Gladstone Lighter   03/12/2020 ECHOCARDIOGRAPHIC DESCRIPTIONS  AORTIC ROOT                   Size: Normal             Dissection: INDETERM FOR DISSECTION  AORTIC VALVE               Leaflets: Tricuspid                   Morphology: MILDLY THICKENED               Mobility: Fully mobile   LEFT VENTRICLE                   Size: Normal                        Anterior: HYPOCONTRACTILE            Contraction: REGIONALLY IMPAIRED            Lateral: Normal             Closest EF: 45% (Estimated)  Septal: Normal              LV Masses: No Masses                       Apical: Normal                    LVH: None                          Inferior: Normal                                                      Posterior: Normal           Dias.FxClass: N/A  MITRAL VALVE               Leaflets: Normal                        Mobility: Fully mobile             Morphology: Normal  LEFT ATRIUM                   Size: Normal                       LA Masses: No masses              IA Septum: Normal IAS  MAIN PA                   Size: Normal  PULMONIC VALVE             Morphology: Normal                        Mobility: Fully mobile  RIGHT VENTRICLE              RV Masses: No Masses                         Size: Normal              Free Wall: Normal                     Contraction: Normal  TRICUSPID VALVE               Leaflets: Normal                        Mobility: Fully mobile             Morphology: Normal  RIGHT ATRIUM                   Size: Normal                        RA Other: None                RA Mass: No masses  PERICARDIUM                  Fluid: No effusion  INFERIOR VENACAVA  Size: Normal Normal respiratory collapse  _________________________________________________________________________________________   DOPPLER ECHO and OTHER SPECIAL PROCEDURES                 Aortic: No AR                      No AS                         141.0 cm/sec peak vel      7.9 mmHg peak grad                 Mitral: MILD MR                    No MS                         MV Inflow E Vel = 113.0 cm/sec      MV Annulus E'Vel = 5.1 cm/sec                         E/E'Ratio = 22.2              Tricuspid: MILD TR                    No TS                          259.3 cm/sec peak TR vel   29.9 mmHg peak RV pressure              Pulmonary: No PR                      No PS                         96.4 cm/sec peak vel       3.7 mmHg peak grad  _________________________________________________________________________________________  INTERPRETATION  MILD LV SYSTOLIC DYSFUNCTION (See above)  NORMAL RIGHT VENTRICULAR SYSTOLIC FUNCTION  MILD VALVULAR REGURGITATION (See above)  NO VALVULAR STENOSIS  MILD MR, TR  EF 45% with SUBTLE DISTAL ANTERIOR AND APICAL HYPOKINESIS   TELEMETRY reviewed by me (LT) 09/23/2021 : converted to sinus rhythm 1700 on 9/7, sinus brady overnight, rate in the 60s during interview  EKG reviewed by me: Atrial fibrillation with RVR rate 151.  Data reviewed by me (LT) 09/23/2021: mag, TSH, cbc, vascular progress note, telemetry, discussed with nursing and vascular surgery  ASSESSMENT AND PLAN:  Molly Mitchell is a 63yoF with a PMH of HFpEF (EF 55%, g1dd 09/20/2021), hypertension, h/o R MCA CVA 07/2021, chronic low back pain, bilateral carotid artery stenosis who presented to [facility] 09/21/2021 for and elective right carotid endarterectomy with Dr. Delana Meyer.  The patient tolerated the procedure without untoward event. On 9/7 she developed atrial fibrillation with RVR (new in onset), for which cardiology is consulted. Spontaneously converted to NSR after ~ 5 hours on 9/7.   #New onset paroxysmal atrial fibrillation with RVR #S/p elective right CEA 09/21/2021 Converted from sinus rhythm to atrial fibrillation with RVR around 1300 on 9/7, heart rates peaking in the 150s. -TSH, magnesium WNL -Continuous monitoring on telemetry -S/p IV diltiazem 10 mg x 2, '240mg'$  diltiazem CD x1 on 9/8 -stop heparin GTT per pharmacy. CHA2DS2-VASc 7 (age, sex, HTN, PAD, CVA) -since her  duration of atrial fibrillation was <24 hours and she was on heparin yesterday afternoon during the episode and spontaneously converted to NSR, will defer starting DOAC at this time.   -Continue IV Protonix, continue oral PPI discharge -Continue pravastatin 40 mg -restart home coreg 3.125 BID, amlodipine 2.5 BID, telmisartan '80mg'$  at discharge - consider holter monitor on an oupatient basis - ok for discharge today with follow upw ith Dr. Nehemiah Massed in 1-2 weeks.   #HFpEF (EF 55%, G1 DD 09/20/2021) Clinically euvolemic.  On room air.  This patient's plan of care was discussed and created with Dr. Nehemiah Massed and he is in agreement.  Signed: Tristan Schroeder , PA-C 09/23/2021, 8:26 AM [facility]  The patient had good result of aggressive medication management for heart rate control with diltiazem metoprolol combination.  She has spontaneously converted to normal sinus rhythm.  Based on all this information and low blood pressure and low heart rate today in normal rhythm with concerns of bleeding complications as per vascular surgery we will make some adjustments.  This includes no anticoagulation due to isolated episode of atrial fibrillation which is likely not to recur as well as concerns of bleeding complications and the patient had heparin as well as less than 24 hours of atrial fibrillation.  The patient understands the risks and benefits of this approach and agrees with this therapy.  The patient has been interviewed and examined. I agree with assessment and plan above. Serafina Royals MD [facility]

## 2021-10-18 ENCOUNTER — Other Ambulatory Visit (INDEPENDENT_AMBULATORY_CARE_PROVIDER_SITE_OTHER): Payer: Self-pay | Admitting: Vascular Surgery

## 2021-10-18 DIAGNOSIS — Z9889 Other specified postprocedural states: Secondary | ICD-10-CM

## 2021-10-18 DIAGNOSIS — I6521 Occlusion and stenosis of right carotid artery: Secondary | ICD-10-CM

## 2021-10-19 NOTE — Progress Notes (Signed)
Patient ID: Molly Mitchell, female   DOB: [birth year], 76 y.o.   MRN: [id number]  No chief complaint on file.   HPI Molly Mitchell is a 76 y.o. female.    The patient is seen for follow up evaluation of carotid stenosis status post right carotid endarterectomy on 09/21/2021.  There were no post operative problems or complications related to the surgery.  The patient denies neck or incisional pain.  Procedure: Right carotid endarterectomy with CorMatrix arterial patch reconstruction   The patient denies interval amaurosis fugax. There is no recent history of TIA symptoms or focal motor deficits. There is no prior documented CVA.  The patient denies headache.  The patient is taking enteric-coated aspirin 81 mg daily.  No recent shortening of the patient's walking distance or new symptoms consistent with claudication.  No history of rest pain symptoms. No new ulcers or wounds of the lower extremities have occurred.  There is no history of DVT, PE or superficial thrombophlebitis. No recent episodes of angina or shortness of breath documented.   Carotid Duplex done today shows RICA 6-14% and LICA 43-15%   Past Medical History:  Diagnosis Date   Acute right MCA stroke ([place]) 08/01/2021   a.) noted on MRI head 08/01/2021 --> punctate acute RIGHT MCA territory infarcts affecting the frontal and parietal contex consistent with microembolic infarcts in the RIGHT carotid circulation   Anemia    Arthritis    Bilateral carotid artery disease ([place]) 08/18/2021   a.) CTA neck 08/18/2021: 40-08% RICA and 67% LICA   Cervicalgia    Chronic kidney disease    Chronic pain syndrome    Chronic, continuous use of opioids    a.) hydrocodone/APAP (Norco 10/325 mg)   Connective tissue disease ([place])    pt reports being diagnosed with undifferentiated connective tissue disease at some point- pt took steroids for this and has had no issues since- 10 years ago   Coronary artery disease    Cortical senile  cataract    DDD (degenerative disc disease), cervical    a.) s/p C4-C6 ACDF   DDD (degenerative disc disease), lumbar    a.) s/p laminectomy + posterior L2-S1 fusion   History of 2019 novel coronavirus disease (COVID-19) 12/07/2018   History of blood transfusion    History of heart murmur in childhood    History of rheumatic fever    HLD (hyperlipidemia)    Hypertension    Lower extremity weakness    a.) with position changes from sitting to standing   Osteoporosis    Pseudogout    Shingles    Skin cancer of face    Spondylolisthesis of lumbar region     Past Surgical History:  Procedure Laterality Date   ABDOMINAL HYSTERECTOMY  1981   AIKEN OSTEOTOMY Right 01/30/2018   Procedure: WEIL RIGHT 2ND AND 3RD;  Surgeon: [name], DPM;  Location: [place];  Service: Podiatry;  Laterality: Right;   ANTERIOR CERVICAL DECOMP/DISCECTOMY FUSION N/A 2003   C4-C6   BACK SURGERY     pt reports hx of 3 lumber back surgeries total   CERVICAL SPINE SURGERY     pt reports having 3 cervical fusions total   ENDARTERECTOMY Right 09/21/2021   Procedure: ENDARTERECTOMY CAROTID;  Surgeon: Katha Cabal, MD;  Location: [facility];  Service: Vascular;  Laterality: Right;  Right carotid   HAMMER TOE SURGERY Right 01/30/2018   Procedure: HAMMER TOE CORRECTION SECOND AND THIRD;  Surgeon: [name],  DPM;  Location: [place];  Service: Podiatry;  Laterality: Right;  GENERAL WITH LOCAL   HAMMER TOE SURGERY Left 03/05/2019   Procedure: HAMMER TOE CORRECTION T1 AND T2;  Surgeon: [name], DPM;  Location: [place];  Service: Podiatry;  Laterality: Left;   POSTERIOR LUMBAR FUSION N/A 2001   L2-S1   ROTATOR CUFF REPAIR Right 1981   SACROPLASTY N/A 03/29/2017   S1   TOTAL KNEE ARTHROPLASTY Left 03/03/2013   TOTAL KNEE REVISION Left 07/31/2016   Procedure: TOTAL KNEE REVISION;  Surgeon: Dereck Leep, MD;  Location: [facility];  Service: Orthopedics;   Laterality: Left;   WEIL OSTEOTOMY Left 03/05/2019   Procedure: WEIL OSTEOTOMY X 2 LEFT;  Surgeon: [name], DPM;  Location: [place];  Service: Podiatry;  Laterality: Left;      Allergies  Allergen Reactions   Amitriptyline Hcl Swelling    Facial swelling   Duricef [Cefadroxil] Swelling    Facial swelling   Levofloxacin Other (See Comments)    Severe Shoulder Pain   Tetanus Toxoid Swelling    Arm swelling   Tramadol Itching   Erythromycin Rash and Other (See Comments)    Rash on bottom    Current Outpatient Medications  Medication Sig Dispense Refill   amLODipine (NORVASC) 2.5 MG tablet Take 2.5 mg by mouth in the morning and at bedtime.     amoxicillin (AMOXIL) 500 MG capsule Take 4 tablets by mouth 1 hour prior to dental procedures. 12 capsule 3   ASPIRIN LOW DOSE 81 MG tablet Take 81 mg by mouth daily.     carvedilol (COREG) 3.125 MG tablet Take 3.125 mg by mouth 2 (two) times daily with a meal.     HYDROcodone-acetaminophen (NORCO) 10-325 MG tablet Take 1 tablet by mouth every 6 (six) hours as needed.     pravastatin (PRAVACHOL) 40 MG tablet Take 40 mg by mouth at bedtime.     telmisartan (MICARDIS) 80 MG tablet Take 80 mg by mouth daily.     No current facility-administered medications for this visit.        Physical Exam There were no vitals taken for this visit. Gen:  WD/WN, NAD Skin: incision C/D/I     Assessment/Plan: 1. Asymptomatic stenosis of right carotid artery Recommend:  The patient is s/p successful right CEA  Duplex ultrasound preoperatively shows 40-59% contralateral stenosis.  Continue antiplatelet therapy as prescribed Continue management of CAD, HTN and Hyperlipidemia Healthy heart diet,  encouraged exercise at least 4 times per week  Follow up in 3 months with duplex ultrasound and physical exam based on the patient's carotid surgery and 40-50% stenosis of the left carotid artery   - VAS US CAROTID;  Future      [name] 10/19/2021, 9:11 AM   This note was created with Dragon medical transcription system.  Any errors from dictation are unintentional.

## 2021-10-20 ENCOUNTER — Encounter (INDEPENDENT_AMBULATORY_CARE_PROVIDER_SITE_OTHER): Payer: Self-pay | Admitting: Vascular Surgery

## 2021-10-20 ENCOUNTER — Ambulatory Visit (INDEPENDENT_AMBULATORY_CARE_PROVIDER_SITE_OTHER): Payer: Medicare HMO | Admitting: Vascular Surgery

## 2021-10-20 ENCOUNTER — Ambulatory Visit (INDEPENDENT_AMBULATORY_CARE_PROVIDER_SITE_OTHER): Payer: Medicare HMO

## 2021-10-20 VITALS — BP 186/85 | HR 69 | Resp 16 | Wt 150.8 lb

## 2021-10-20 DIAGNOSIS — Z9889 Other specified postprocedural states: Secondary | ICD-10-CM | POA: Diagnosis not present

## 2021-10-20 DIAGNOSIS — I6521 Occlusion and stenosis of right carotid artery: Secondary | ICD-10-CM

## 2021-10-21 ENCOUNTER — Encounter (INDEPENDENT_AMBULATORY_CARE_PROVIDER_SITE_OTHER): Payer: Self-pay | Admitting: Vascular Surgery

## 2021-11-14 ENCOUNTER — Encounter (INDEPENDENT_AMBULATORY_CARE_PROVIDER_SITE_OTHER): Payer: Self-pay

## 2021-12-13 ENCOUNTER — Other Ambulatory Visit: Payer: Self-pay | Admitting: Neurosurgery

## 2021-12-13 DIAGNOSIS — M546 Pain in thoracic spine: Secondary | ICD-10-CM

## 2021-12-13 DIAGNOSIS — Z981 Arthrodesis status: Secondary | ICD-10-CM

## 2021-12-21 ENCOUNTER — Ambulatory Visit
Admission: RE | Admit: 2021-12-21 | Discharge: 2021-12-21 | Disposition: A | Discharge status: Home / Self Care | Payer: Medicare HMO | Source: Ambulatory Visit | Attending: Neurosurgery | Admitting: Neurosurgery

## 2021-12-21 DIAGNOSIS — M546 Pain in thoracic spine: Secondary | ICD-10-CM | POA: Diagnosis present

## 2021-12-21 MED ORDER — GADOBUTROL 1 MMOL/ML IV SOLN
6.0000 mL | Freq: Once | INTRAVENOUS | Status: AC | PRN
Start: 2021-12-21 — End: 2021-12-21
  Administered 2021-12-21: 6 mL via INTRAVENOUS

## 2021-12-23 ENCOUNTER — Ambulatory Visit: Payer: Medicare HMO

## 2021-12-23 ENCOUNTER — Ambulatory Visit
Admission: RE | Admit: 2021-12-23 | Discharge: 2021-12-23 | Disposition: A | Discharge status: Home / Self Care | Payer: Medicare HMO | Source: Ambulatory Visit | Attending: Neurosurgery | Admitting: Neurosurgery

## 2021-12-23 DIAGNOSIS — Z981 Arthrodesis status: Secondary | ICD-10-CM | POA: Insufficient documentation

## 2021-12-29 ENCOUNTER — Other Ambulatory Visit: Payer: Self-pay | Admitting: Neurosurgery

## 2022-01-04 ENCOUNTER — Encounter: Payer: Self-pay | Admitting: Cardiology

## 2022-01-04 ENCOUNTER — Ambulatory Visit: Payer: Medicare HMO | Attending: Cardiology | Admitting: Cardiology

## 2022-01-04 ENCOUNTER — Other Ambulatory Visit
Admission: RE | Admit: 2022-01-04 | Discharge: 2022-01-04 | Disposition: A | Discharge status: Home / Self Care | Payer: Medicare HMO | Attending: Cardiology | Admitting: Cardiology

## 2022-01-04 VITALS — BP 170/80 | HR 82 | Ht 62.0 in | Wt 155.5 lb

## 2022-01-04 DIAGNOSIS — Z01812 Encounter for preprocedural laboratory examination: Secondary | ICD-10-CM

## 2022-01-04 DIAGNOSIS — I4891 Unspecified atrial fibrillation: Secondary | ICD-10-CM | POA: Insufficient documentation

## 2022-01-04 DIAGNOSIS — I639 Cerebral infarction, unspecified: Secondary | ICD-10-CM

## 2022-01-04 LAB — BASIC METABOLIC PANEL
Anion gap: 10 (ref 5–15)
BUN: 21 mg/dL (ref 8–23)
CO2: 26 mmol/L (ref 22–32)
Calcium: 9.5 mg/dL (ref 8.9–10.3)
Chloride: 104 mmol/L (ref 98–111)
Creatinine, Ser: 0.9 mg/dL (ref 0.44–1.00)
GFR, Estimated: >60 mL/min (ref >60)
Glucose, Bld: 100 mg/dL — ABNORMAL HIGH (ref 70–99)
Potassium: 4.5 mmol/L (ref 3.5–5.1)
Sodium: 140 mmol/L (ref 135–145)

## 2022-01-04 MED ORDER — METOPROLOL TARTRATE 100 MG PO TABS: ORAL_TABLET | ORAL | 0 refills | Status: DC

## 2022-01-04 NOTE — Progress Notes (Signed)
Electrophysiology Office Note:    Date:  01/04/2022   ID:  Molly Mitchell, Molly Mitchell 03-28-1945, MRN [id number]  PCP:  [place] Cardiologist:  None  [facility] Electrophysiologist:  Molly Epley, MD   Referring MD: Gladstone Lighter, MD   Chief Complaint: Atrial fibrillation  History of Present Illness:    Molly Mitchell is a 76 y.o. female who presents for an evaluation of atrial fibrillation at the request of Molly Mitchell. Their medical history includes MCA stroke, carotid artery disease, chronic kidney disease, coronary artery disease, hypertension, hyperlipidemia and atrial fibrillation.  The patient was seen by Molly Mitchell November 04, 2021.  The patient was hospitalized for right sided carotid endarterectomy on September 21, 2021 and had an episode of postop A-fib that converted with IV diltiazem.  There were no prolonged episodes of atrial fibrillation on a postdischarge heart monitor.  At the appointment with Molly Mitchell, Eliquis was started given her significantly elevated CHA2DS2-VASc, multiple TIAs, stroke and short episodes of atrial fibrillation on heart monitoring.  She is very interested in coming off anticoagulation long-term given her chronic use of NSAIDs for her chronic back pain.  She is unable to afford NOACs and is currently taking Coumadin.  She is also experienced GI bleeding in the past which makes her concerned about long-term anticoagulation use.    Past Medical History:  Diagnosis Date   Acute right MCA stroke ([place]) 08/01/2021   a.) noted on MRI head 08/01/2021 --> punctate acute RIGHT MCA territory infarcts affecting the frontal and parietal contex consistent with microembolic infarcts in the RIGHT carotid circulation   Anemia    Arthritis    Bilateral carotid artery disease ([place]) 08/18/2021   a.) CTA neck 08/18/2021: 65-46% RICA and 50% LICA   Cervicalgia    Chronic kidney disease    Chronic pain syndrome    Chronic,  continuous use of opioids    a.) hydrocodone/APAP (Norco 10/325 mg)   Connective tissue disease ([place])    pt reports being diagnosed with undifferentiated connective tissue disease at some point- pt took steroids for this and has had no issues since- 10 years ago   Coronary artery disease    Cortical senile cataract    DDD (degenerative disc disease), cervical    a.) s/p C4-C6 ACDF   DDD (degenerative disc disease), lumbar    a.) s/p laminectomy + posterior L2-S1 fusion   History of 2019 novel coronavirus disease (COVID-19) 12/07/2018   History of blood transfusion    History of heart murmur in childhood    History of rheumatic fever    HLD (hyperlipidemia)    Hypertension    Lower extremity weakness    a.) with position changes from sitting to standing   Osteoporosis    Pseudogout    Shingles    Skin cancer of face    Spondylolisthesis of lumbar region     Past Surgical History:  Procedure Laterality Date   ABDOMINAL HYSTERECTOMY  1981   AIKEN OSTEOTOMY Right 01/30/2018   Procedure: WEIL RIGHT 2ND AND 3RD;  Surgeon: [name], DPM;  Location: [place];  Service: Podiatry;  Laterality: Right;   ANTERIOR CERVICAL DECOMP/DISCECTOMY FUSION N/A 2003   C4-C6   BACK SURGERY     pt reports hx of 3 lumber back surgeries total   CERVICAL SPINE SURGERY     pt reports having 3 cervical fusions total   ENDARTERECTOMY Right 09/21/2021   Procedure: ENDARTERECTOMY  CAROTID;  Surgeon: Katha Cabal, MD;  Location: [facility];  Service: Vascular;  Laterality: Right;  Right carotid   HAMMER TOE SURGERY Right 01/30/2018   Procedure: HAMMER TOE CORRECTION SECOND AND THIRD;  Surgeon: [name], DPM;  Location: [place];  Service: Podiatry;  Laterality: Right;  GENERAL WITH LOCAL   HAMMER TOE SURGERY Left 03/05/2019   Procedure: HAMMER TOE CORRECTION T1 AND T2;  Surgeon: [name], DPM;  Location: [place];  Service: Podiatry;  Laterality: Left;    POSTERIOR LUMBAR FUSION N/A 2001   L2-S1   ROTATOR CUFF REPAIR Right 1981   SACROPLASTY N/A 03/29/2017   S1   TOTAL KNEE ARTHROPLASTY Left 03/03/2013   TOTAL KNEE REVISION Left 07/31/2016   Procedure: TOTAL KNEE REVISION;  Surgeon: Dereck Leep, MD;  Location: [facility];  Service: Orthopedics;  Laterality: Left;   WEIL OSTEOTOMY Left 03/05/2019   Procedure: WEIL OSTEOTOMY X 2 LEFT;  Surgeon: [name], DPM;  Location: [place];  Service: Podiatry;  Laterality: Left;    Current Medications: Current Meds  Medication Sig   amLODipine (NORVASC) 2.5 MG tablet Take 2.5 mg by mouth in the morning and at bedtime.   carvedilol (COREG) 3.125 MG tablet Take 3.125 mg by mouth 2 (two) times daily with a meal.   HYDROcodone-acetaminophen (NORCO) 10-325 MG tablet Take 1 tablet by mouth every 6 (six) hours as needed.   pravastatin (PRAVACHOL) 40 MG tablet Take 40 mg by mouth at bedtime.   telmisartan (MICARDIS) 80 MG tablet Take 80 mg by mouth daily.   tiZANidine (ZANAFLEX) 4 MG tablet Take 4 mg by mouth 2 (two) times daily.   warfarin (COUMADIN) 2.5 MG tablet Take by mouth.     Allergies:   Amitriptyline hcl, Duricef [cefadroxil], Levofloxacin, Tetanus toxoid, Tramadol, and Erythromycin   Social History   Socioeconomic History   Marital status: Married    Spouse name: Eddie Dibbles   Number of children: 2   Years of education: Not on file   Highest education level: High school graduate  Occupational History   Occupation: retired  Tobacco Use   Smoking status: Former    Types: Cigarettes    Quit date: 04/17/1979    Years since quitting: 42.7   Smokeless tobacco: Never  Vaping Use   Vaping Use: Never used  Substance and Sexual Activity   Alcohol use: No    Alcohol/week: 0.0 standard drinks of alcohol   Drug use: No   Sexual activity: Not on file  Other Topics Concern   Not on file  Social History Narrative   Not on file   Social Determinants of Health   Financial  Resource Strain: Low Risk  (03/23/2020)   Overall Financial Resource Strain (CARDIA)    Difficulty of Paying Living Expenses: Not hard at all  Food Insecurity: No Food Insecurity (09/21/2021)   Hunger Vital Sign    Worried About Running Out of Food in the Last Year: Never true    [place] in the Last Year: Never true  Transportation Needs: No Transportation Needs (09/21/2021)   PRAPARE - Hydrologist (Medical): No    Lack of Transportation (Non-Medical): No  Physical Activity: Sufficiently Active (03/23/2020)   Exercise Vital Sign    Days of Exercise per Week: 3 days    Minutes of Exercise per Session: 60 min  Stress: No Stress Concern Present (03/23/2020)   [place] -  Occupational Stress Questionnaire    Feeling of Stress : Not at all  Social Connections: Socially Integrated (03/23/2020)   Social Connection and Isolation Panel [NHANES]    Frequency of Communication with Friends and Family: Never    Frequency of Social Gatherings with Friends and Family: More than three times a week    Attends Religious Services: More than 4 times per year    Active Member of [employer] or Organizations: Yes    Attends Music therapist: More than 4 times per year    Marital Status: Married     Family History: The patient's family history includes Cancer in her sister; Diabetes in her paternal grandmother; Heart failure in her maternal grandmother; Hypertension in her brother, mother, and sister.  ROS:   Please see the history of present illness.    All other systems reviewed and are negative.  EKGs/Labs/Other Studies Reviewed:    The following studies were reviewed today:  EKG from September 22, 2021 shows atrial fibrillation with rapid ventricular rate   Recent Labs: 09/22/2021: BUN 19; Creatinine, Ser 0.94; Magnesium 2.4; Potassium 5.0; Sodium 136; TSH 0.410 09/23/2021: Hemoglobin 9.6; Platelets 224  Recent Lipid Panel     Component Value Date/Time   CHOL 220 (H) 05/31/2020 1007   TRIG 100 05/31/2020 1007   HDL 76 05/31/2020 1007   CHOLHDL 2.9 05/31/2020 1007   CHOLHDL 3.1 09/26/2016 1108   LDLCALC 127 (H) 05/31/2020 1007   LDLCALC 113 (H) 09/26/2016 1108    Physical Exam:    VS:  BP (!) 170/80   Pulse 82   Ht '5\' 2"'$  (1.575 m)   Wt 155 lb 8 oz (70.5 kg)   SpO2 98%   BMI 28.44 kg/m     Wt Readings from Last 3 Encounters:  01/04/22 155 lb 8 oz (70.5 kg)  10/20/21 150 lb 12.8 oz (68.4 kg)  09/21/21 145 lb (65.8 kg)     GEN:  Well nourished, well developed in no acute distress HEENT: Normal NECK: No JVD; No carotid bruits LYMPHATICS: No lymphadenopathy CARDIAC: RRR, no murmurs, rubs, gallops RESPIRATORY:  Clear to auscultation without rales, wheezing or rhonchi  ABDOMEN: Soft, non-tender, non-distended MUSCULOSKELETAL:  No edema; No deformity  SKIN: Warm and dry NEUROLOGIC:  Alert and oriented x 3 PSYCHIATRIC:  Normal affect       ASSESSMENT:    1. Atrial fibrillation, unspecified type ([place])   2. Cerebrovascular accident (CVA), unspecified mechanism ([place])    PLAN:    In order of problems listed above:  #Atrial Fibrillation Low burden but with significantly elevated chadsvasc. She is very interested in avoiding long-term anticoagulation use given her chronic NSAID use and history of GI bleeding.  I think is very reasonable for her to pursue left atrial appendage occlusion.  I discussed the procedure in detail include the risks and she wishes to proceed with scheduling.  CHA2DS2-VASc Score = 7 (11.2% annual estimated stroke risk) The patient's score is based upon: CHF History: 0 HTN History: 1 Diabetes History: 0 Stroke History: 2 Vascular Disease History: 1 Age Score: 2 Gender Score: 1  --------------------------------  I have seen Molly Mitchell in the office today who is being considered for a Watchman left atrial appendage closure device. I believe they will benefit from  this procedure given their history of atrial fibrillation, CHA2DS2-VASc score of 7 and unadjusted ischemic stroke rate of 11.2% per year. Unfortunately, the patient is not felt to be a long term anticoagulation candidate  secondary to history of GI bleeding and chronic NSAID use. The patient's chart has been reviewed and I feel that they would be a candidate for short term oral anticoagulation after Watchman implant.   It is my belief that after undergoing a LAA closure procedure, GUILIANNA MCKOY will not need long term anticoagulation which eliminates anticoagulation side effects and major bleeding risk.   Procedural risks for the Watchman implant have been reviewed with the patient including a 0.5% risk of stroke, <1% risk of perforation and <1% risk of device embolization. Other risks include bleeding, vascular damage, tamponade, worsening renal function, and death. The patient understands these risk and wishes to proceed.     The published clinical data on the safety and effectiveness of WATCHMAN include but are not limited to the following: - Holmes DR, Mechele Claude, Sick P et al. for the PROTECT AF Investigators. Percutaneous closure of the left atrial appendage versus warfarin therapy for prevention of stroke in patients with atrial fibrillation: a randomised non-inferiority trial. Lancet 2009; 374: 534-42. Mechele Claude, Doshi SK, Abelardo Diesel D et al. on behalf of the PROTECT AF Investigators. Percutaneous Left Atrial Appendage Closure for Stroke Prophylaxis in Patients With Atrial Fibrillation 2.3-Year Follow-up of the PROTECT AF (Watchman Left Atrial Appendage System for Embolic Protection in Patients With Atrial Fibrillation) Trial. Circulation 2013; 127:720-729. - Alli O, Doshi S,  Kar S, Reddy VY, Sievert H et al. Quality of Life Assessment in the Randomized PROTECT AF (Percutaneous Closure of the Left Atrial Appendage Versus Warfarin Therapy for Prevention of Stroke in Patients With Atrial  Fibrillation) Trial of Patients at Risk for Stroke With Nonvalvular Atrial Fibrillation. J Am Coll Cardiol 2013; 82:9937-1. Vertell Limber DR, Tarri Abernethy, Price M, [place], Sievert H, Doshi S, Huber K, Reddy V. Prospective randomized evaluation of the Watchman left atrial appendage Device in patients with atrial fibrillation versus long-term warfarin therapy; the PREVAIL trial. Journal of the [employer] of Cardiology, Vol. 4, No. 1, 2014, 1-11. - Kar S, Doshi SK, Sadhu A, Horton R, Osorio J et al. Primary outcome evaluation of a next-generation left atrial appendage closure device: results from the PINNACLE FLX trial. Circulation 2021;143(18)1754-1762.    After today's visit with the patient which was dedicated solely for shared decision making visit regarding LAA closure device, the patient decided to proceed with the LAA appendage closure procedure scheduled to be done in the near future at [facility]. Prior to the procedure, I would like to obtain a gated CT scan of the chest with contrast timed for PV/LA visualization.     HAS-BLED score 5 Hypertension Yes  Abnormal renal and liver function (Dialysis, transplant, Cr >2.26 mg/dL /Cirrhosis or Bilirubin >2x Normal or AST/ALT/AP >3x Normal) No  Stroke Yes  Bleeding Yes  Labile INR (Unstable/high INR) No  Elderly (>65) Yes  Drugs or alcohol (? 8 drinks/week, anti-plt or NSAID) Yes   CHA2DS2-VASc Score = 7  The patient's score is based upon: CHF History: 0 HTN History: 1 Diabetes History: 0 Stroke History: 2 Vascular Disease History: 1 Age Score: 2 Gender Score: 1   #Hypertension Above goal today.  She tells me is always above goal at doctors' offices.  At home she checks her blood pressures and they are typically in the 1 69-6 40 range systolic.  Recommend checking blood pressures 1-2 times per week at home and recording the values.  Recommend bringing these recordings to the primary care physician.  If her  pressures remain  elevated on average, would recommend increase amlodipine further.     Medication Adjustments/Labs and Tests Ordered: Current medicines are reviewed at length with the patient today.  Concerns regarding medicines are outlined above.  No orders of the defined types were placed in this encounter.  No orders of the defined types were placed in this encounter.    Signed, Hilton Cork. Quentin Ore, MD, [facility], [facility] 01/04/2022 11:11 AM    Electrophysiology [facility]

## 2022-01-04 NOTE — Patient Instructions (Addendum)
Medication Instructions:  - Your physician recommends that you continue on your current medications as directed. Please refer to the Current Medication list given to you today.  *If you need a refill on your cardiac medications before your next appointment, please call your pharmacy*   Lab Work: - Your physician recommends that you have lab work today:  [place] Entrance at [facility] 1st desk on the right to check in (REGISTRATION)  Lab hours: Monday- Friday (7:30 am- 5:30 pm)    If you have labs (blood work) drawn today and your tests are completely normal, you will receive your results only by: MyChart Message (if you have MyChart) OR A paper copy in the mail If you have any lab test that is abnormal or we need to change your treatment, we will call you to review the results.   Testing/Procedures:  WATCHMAN CT INSTRUCTIONS: Your WATCHMAN CT will be scheduled at [place] will be called to confirm date and time.  The day of your CT appointment, please enter [facility] through the [employer] [facility] (Entrance C off [address] You may use the FREE valet parking offered at entrance C (encouraged to control the heart rate for the test). Check in at the large round desk through the glass doors 30 minutes prior to your scheduled appointment.   Please follow these instructions carefully:   On the Night Before the Test: Be sure to Drink plenty of water. Do not consume any caffeinated/decaffeinated beverages or chocolate 12 hours prior to your test. Do not take any antihistamines 12 hours prior to your test.   On the Day of the Test: You may take your regular medications prior to the test.  Take metoprolol (Lopressor) 100 mg two hours prior to test FEMALES- please wear underwire-free bra if available       After the Test: Drink plenty of water. After receiving IV contrast, you may experience a mild flushed feeling. This is normal. On  occasion, you may experience a mild rash up to 24 hours after the test. This is not dangerous. If this occurs, you can take Benadryl 25 mg and increase your fluid intake. If you experience trouble breathing, this can be serious. If it is severe call 911 IMMEDIATELY. If it is mild, please call our office.   Once we have confirmed authorization from your insurance company, you will be called to set up a date and time for your test.   For non-scheduling related questions/concerns about your CT scan, please contact the cardiac imaging nurses: Marchia Bond, Cardiac Imaging Nurse Navigator Gordy Clement, Cardiac Imaging Nurse [facility] Direct Office Dial: [phone]   For scheduling needs, including cancellations and rescheduling, please call [place], [phone].  Lenice Llamas, the Watchman Nurse Navigator, will call you after your CT once the [facility] Team has reviewed your imaging for an update on proceedings. [place]'s direct number is [phone] if you need assistance.   Follow-Up: At [facility], you and your health needs are our priority.  As part of our continuing mission to provide you with exceptional heart care, we have created designated Provider Care Teams.  These Care Teams include your primary Cardiologist (physician) and Advanced Practice Providers (APPs -  Physician Assistants and Nurse Practitioners) who all work together to provide you with the care you need, when you need it.  We recommend signing up for the patient portal called "MyChart".  Sign up information is  provided on this After Visit Summary.  MyChart is used to connect with patients for Virtual Visits (Telemedicine).  Patients are able to view lab/test results, encounter notes, upcoming appointments, etc.  Non-urgent messages can be sent to your provider as well.   To learn more about what you can do with MyChart, go to [email or web address].    Your next appointment:    Pending the date of your Watchman procedure   The format for your next appointment:   In Person  Provider:   Kathyrn Drown, NP    Other Instructions Left Atrial Appendage Closure Device Implantation  Left atrial appendage (LAA) closure device implantation is a procedure to put a small device in the LAA of the heart. The LAA is a small sac in the wall of the heart's left upper chamber. Blood clots can form in the LAA in people with atrial fibrillation (AFib). The device closes the LAA to help prevent a blood clot and stroke. AFib is a type of irregular or rapid heartbeat (arrhythmia). There is an increased risk of blood clots and stroke with AFib. This procedure helps to reduce that risk. Tell a health care provider about: Any allergies you have. All medicines you are taking, including vitamins, herbs, eye drops, creams, and over-the-counter medicines. Any problems you or family members have had with anesthetic medicines. Any blood disorders you have. Any surgeries you have had. Any medical conditions you have. Whether you are pregnant or may be pregnant. What are the risks? Generally, this is a safe procedure. However, problems may occur, including: Infection. Bleeding. Allergic reactions to medicines or dyes. Damage to nearby structures or organs. Heart attack. Stroke. Blood clots. Changes in heart rhythm. Device failure. What happens before the procedure? Staying hydrated Follow instructions from your health care provider about hydration, which may include: Up to 2 hours before the procedure - you may continue to drink clear liquids, such as water, clear fruit juice, black coffee, and plain tea. Eating and drinking restrictions Follow instructions from your health care provider about eating and drinking, which may include: 8 hours before the procedure - stop eating heavy meals or foods, such as meat, fried foods, or fatty foods. 6 hours before the procedure - stop eating  light meals or foods, such as toast or cereal. 6 hours before the procedure - stop drinking milk or drinks that contain milk. 2 hours before the procedure - stop drinking clear liquids. Medicines Ask your health care provider about: Changing or stopping your regular medicines. This is especially important if you are taking diabetes medicines or blood thinners. Taking medicines such as aspirin and ibuprofen. These medicines can thin your blood. Do not take these medicines unless your health care provider tells you to take them. Taking over-the-counter medicines, vitamins, herbs, and supplements. Tests You may have blood tests and a physical exam. You may have an electrocardiogram (ECG). This test checks your heart's electrical patterns and rhythms. General instructions Do not use any products that contain nicotine or tobacco. These include cigarettes, chewing tobacco, and vaping devices, such as e-cigarettes. If you need help quitting, ask your health care provider. Ask your health care provider what steps will be taken to help prevent infection. These steps may include: Removing hair at the surgery site. Washing skin with a germ-killing soap. Taking antibiotic medicine. Plan to have a responsible adult take you home from the hospital or clinic. Plan to have a responsible adult care for you for the time you are told after  you leave the hospital or clinic. This is important. What happens during the procedure? An IV will be inserted into one of your veins. You will be given one or more of the following: A medicine to help you relax (sedative). A medicine to make you fall asleep (general anesthetic). A small incision will be made in your groin area. A small wire will be put through the incision and into a blood vessel. Dye may be injected so X-rays can be used to guide the wire through the blood vessel. A long, thin tube (catheter) will be put over the small wire and moved up through the blood  vessel to reach your heart. The closure device will be moved through the catheter until it reaches your heart. A small hole will be made in the septum (transseptal puncture). The septum is a thin tissue that separates the upper two chambers of the heart. The device will be placed so that it closes the LAA. X-rays will be done to make sure the device is in the right place. The catheter and wire will be removed. The closure device will remain in your heart. After pressure is applied over the catheter site to prevent bleeding, a bandage (dressing) will be placed over the site where the catheter was inserted. The procedure may vary among health care providers and hospitals. What happens after the procedure? Your blood pressure, heart rate, breathing rate, and blood oxygen level will be monitored until you leave the hospital or clinic. You may have to wear compression stockings. These stockings help to prevent blood clots and reduce swelling in your legs. If you were given a sedative during the procedure, it can affect you for several hours. Do not drive or operate machinery until your health care provider says it is safe. You may be given pain medicine. You may need to drink more fluids to wash (flush) the dye out of your body. Drink enough fluid to keep your urine pale yellow. Take over-the-counter and prescription medicines only as told by your health care provider. This is especially important if you were given blood thinners. Summary Left atrial appendage (LAA) closure device implantation is a procedure that is done to put a small device in the LAA of the heart. The LAA is a small sac in the wall of the heart's left upper chamber. The device closes the LAA to prevent stroke and other problems. Follow instructions from your health care provider before and after the procedure. This information is not intended to replace advice given to you by your health care provider. Make sure you discuss any  questions you have with your health care provider. Document Revised: 09/11/2019 Document Reviewed: 09/11/2019 Elsevier Patient Education  [place]

## 2022-01-25 ENCOUNTER — Other Ambulatory Visit (HOSPITAL_COMMUNITY): Payer: Medicare HMO

## 2022-01-25 ENCOUNTER — Encounter: Payer: Self-pay | Admitting: Cardiology

## 2022-01-26 NOTE — Progress Notes (Signed)
Surgical Instructions    Your procedure is scheduled on Wednesday, January 17th, 2024.   Report to [facility] Main Entrance "A" at 11:00 A.M., then check in with the Admitting office.  Call this number if you have problems the morning of surgery:  [phone]   If you have any questions prior to your surgery date call [phone]: Open Monday-Friday 8am-4pm If you experience any cold or flu symptoms such as cough, fever, chills, shortness of breath, etc. between now and your scheduled surgery, please notify us at the above number     Remember:  Do not eat or drink after midnight the night before your surgery    Take these medicines the morning of surgery with A SIP OF WATER:   amLODipine (NORVASC)  carvedilol (COREG)  HYDROcodone-acetaminophen (NORCO/VICODIN   STOP COUMADIN 5 DAYS PRIOR to your surgery  As of today, STOP taking any Aspirin (unless otherwise instructed by your surgeon) Aleve, Naproxen, Ibuprofen, Motrin, Advil, Goody's, BC's, all herbal medications, fish oil, and all vitamins.    The day of surgery:          Do not wear jewelry or makeup. Do not wear lotions, powders, perfumes or deodorant. Do not shave 48 hours prior to surgery.   Do not bring valuables to the hospital. Do not wear nail polish, gel polish, artificial nails, or any other type of covering on natural nails (fingers and toes) If you have artificial nails or gel coating that need to be removed by a nail salon, please have this removed prior to surgery. Artificial nails or gel coating may interfere with anesthesia's ability to adequately monitor your vital signs.  [facility] is not responsible for any belongings or valuables.    Do NOT Smoke (Tobacco/Vaping)  24 hours prior to your procedure  If you use a CPAP at night, you may bring your mask for your overnight stay.   Contacts, glasses, hearing aids, dentures or partials may not be worn into surgery, please bring cases for these belongings    For patients admitted to the hospital, discharge time will be determined by your treatment team.   Patients discharged the day of surgery will not be allowed to drive home, and someone needs to stay with them for 24 hours.   SURGICAL WAITING ROOM VISITATION Patients having surgery or a procedure may have no more than 2 support people in the waiting area - these visitors may rotate.   Children under the age of 40 must have an adult with them who is not the patient. If the patient needs to stay at the hospital during part of their recovery, the visitor guidelines for inpatient rooms apply. Pre-op nurse will coordinate an appropriate time for 1 support person to accompany patient in pre-op.  This support person may not rotate.   Please refer to [email or web address] for the visitor guidelines for Inpatients (after your surgery is over and you are in a regular room).    Special instructions:    Oral Hygiene is also important to reduce your risk of infection.  Remember - BRUSH YOUR TEETH THE MORNING OF SURGERY WITH YOUR REGULAR TOOTHPASTE   [facility]- Preparing For Surgery  Before surgery, you can play an important role. Because skin is not sterile, your skin needs to be as free of germs as possible. You can reduce the number of germs on your skin by washing with CHG (chlorahexidine gluconate) Soap before surgery.  CHG is an antiseptic cleaner which kills germs and  bonds with the skin to continue killing germs even after washing.     Please do not use if you have an allergy to CHG or antibacterial soaps. If your skin becomes reddened/irritated stop using the CHG.  Do not shave (including legs and underarms) for at least 48 hours prior to first CHG shower. It is OK to shave your face.  Please follow these instructions carefully.     Shower the NIGHT BEFORE SURGERY and the MORNING OF SURGERY with CHG Soap.   If you chose to wash your hair,  wash your hair first as usual with your normal shampoo. After you shampoo, rinse your hair and body thoroughly to remove the shampoo.  Then [employer] and genitals (private parts) with your normal soap and rinse thoroughly to remove soap.  After that Use CHG Soap as you would any other liquid soap. You can apply CHG directly to the skin and wash gently with a scrungie or a clean washcloth.   Apply the CHG Soap to your body ONLY FROM THE NECK DOWN.  Do not use on open wounds or open sores. Avoid contact with your eyes, ears, mouth and genitals (private parts). Wash Face and genitals (private parts)  with your normal soap.   Wash thoroughly, paying special attention to the area where your surgery will be performed.  Thoroughly rinse your body with warm water from the neck down.  DO NOT shower/wash with your normal soap after using and rinsing off the CHG Soap.  Pat yourself dry with a CLEAN TOWEL.  Wear CLEAN PAJAMAS to bed the night before surgery  Place CLEAN SHEETS on your bed the night before your surgery  DO NOT SLEEP WITH PETS.   Day of Surgery:  Take a shower with CHG soap. Wear Clean/Comfortable clothing the morning of surgery Do not apply any deodorants/lotions.   Remember to brush your teeth WITH YOUR REGULAR TOOTHPASTE.    If you received a COVID test during your pre-op visit, it is requested that you wear a mask when out in public, stay away from anyone that may not be feeling well, and notify your surgeon if you develop symptoms. If you have been in contact with anyone that has tested positive in the last 10 days, please notify your surgeon.    Please read over the following fact sheets that you were given.

## 2022-01-27 ENCOUNTER — Telehealth (HOSPITAL_COMMUNITY): Payer: Self-pay | Admitting: Emergency Medicine

## 2022-01-27 ENCOUNTER — Inpatient Hospital Stay (HOSPITAL_COMMUNITY)
Admission: RE | Admit: 2022-01-27 | Discharge: 2022-01-27 | Disposition: A | Discharge status: Home / Self Care | Payer: Medicare HMO | Source: Ambulatory Visit

## 2022-01-27 NOTE — Telephone Encounter (Signed)
Attempted to call patient regarding upcoming cardiac CT appointment. Left message on voicemail with name and callback number Marchia Bond RN Navigator Cardiac Section Heart [facility] V[facility] Services [phone] Office [phone] Cell

## 2022-01-27 NOTE — Telephone Encounter (Signed)
Reaching out to patient to offer assistance regarding upcoming cardiac imaging study; pt verbalizes understanding of appt date/time, parking situation and where to check in, pre-test NPO status and medications ordered, and verified current allergies; name and call back number provided for further questions should they arise Marchia Bond RN Navigator Cardiac Imaging [facility] [phone] office [phone] cell  Arrival [address] WC entrance Denies iv issues '100mg'$  metoprolol tartrate

## 2022-01-30 ENCOUNTER — Inpatient Hospital Stay (HOSPITAL_BASED_OUTPATIENT_CLINIC_OR_DEPARTMENT_OTHER)
Admission: RE | Admit: 2022-01-30 | Discharge: 2022-01-30 | Disposition: A | Discharge status: Home / Self Care | Payer: Medicare HMO | Source: Ambulatory Visit | Attending: Cardiology | Admitting: Cardiology

## 2022-01-30 ENCOUNTER — Encounter: Payer: Self-pay | Admitting: Cardiology

## 2022-01-30 DIAGNOSIS — I4891 Unspecified atrial fibrillation: Secondary | ICD-10-CM | POA: Insufficient documentation

## 2022-01-30 MED ORDER — METOPROLOL TARTRATE 5 MG/5ML IV SOLN: 5.0000 mg | INTRAVENOUS | Status: DC | PRN

## 2022-01-30 MED ORDER — IOHEXOL 350 MG/ML SOLN
95.0000 mL | Freq: Once | INTRAVENOUS | Status: AC | PRN
  Administered 2022-01-30: 95 mL via INTRAVENOUS

## 2022-01-31 ENCOUNTER — Encounter (HOSPITAL_COMMUNITY): Payer: Self-pay | Admitting: Physician Assistant

## 2022-01-31 ENCOUNTER — Encounter (HOSPITAL_COMMUNITY): Payer: Self-pay | Admitting: Neurosurgery

## 2022-01-31 ENCOUNTER — Other Ambulatory Visit: Payer: Self-pay

## 2022-01-31 NOTE — Anesthesia Preprocedure Evaluation (Deleted)
Anesthesia Evaluation    Airway        Dental   Pulmonary former smoker          Cardiovascular hypertension,      Neuro/Psych    GI/Hepatic   Endo/Other    Renal/GU      Musculoskeletal   Abdominal   Peds  Hematology   Anesthesia Other Findings   Reproductive/Obstetrics                              Anesthesia Physical Anesthesia Plan  ASA:   Anesthesia Plan:    Post-op Pain Management:    Induction:   PONV Risk Score and Plan:   Airway Management Planned:   Additional Equipment:   Intra-op Plan:   Post-operative Plan:   Informed Consent:   Plan Discussed with:   Anesthesia Plan Comments: (PAT note by Karoline Caldwell, PA-C: Follows with cardiologist Dr. Nehemiah Massed at [place] [facility] for recent diagnosis of atrial fibrillation.  She also has history of moderate mitral insufficiency, HTN, HLD, TIA, carotid stenosis s/p right endarterectomy 09/21/2021. Patient was recently hospitalized for elective right carotid endarterectomy on 09/21/2021 and postoperatively had an episode of atrial fibrillation lasting approximately 5 hours with conversion to normal sinus rhythm after IV diltiazem. DOAC was deferred due to short episode of atrial fibrillation with spontaneous conversion.  Echo 09/20/2021 showed normal biventricular function with mild tricuspid regurgitation.  Subsequent Holter monitor showed evidence of short episodes of atrial fibrillation.  Due to CHA2DS2-VASc score of 6, decision was made for anticoagulation.  She was initially on Eliquis but later transitioned to Coumadin due to prohibitive cost.  Stress test 10/18/2021 showed normal perfusion without evidence of ischemia.  Per clearance from Dr. Nehemiah Massed dated 12/28/2021, patient was deemed low risk and cleared to hold Coumadin for 5 days prior to surgery.  Patient had follow-up with vascular surgeon Dr. Delana Meyer on 10/20/2021.  Duplex  ultrasound at that time showed RICA 1-82% and LICA 99-37%.  Patient will need day of surgery labs and evaluation.  Carotid duplex 10/20/2021: Summary:  Right Carotid: Velocities in the right ICA are consistent with a 1-39% stenosis.  Left Carotid: Velocities in the left ICA are consistent with a 40-59% stenosis.  Vertebrals: Bilateral vertebral arteries demonstrate antegrade flow.  Subclavians: Normal flow hemodynamics were seen in bilateral subclavian arteries.   Nuclear stress 10/18/2021 (Care Everywhere): Indeterminant treadmill EKG due to baseline EKG changes  Normal myocardial perfusion without evidence of myocardial ischemia   TTE 09/20/2021 (Care Everywhere): INTERPRETATION  NORMAL LEFT VENTRICULAR SYSTOLIC FUNCTION  NORMAL RIGHT VENTRICULAR SYSTOLIC FUNCTION  MILD VALVULAR REGURGITATION (See above)  NO VALVULAR STENOSIS  No embolic source seen    )         Anesthesia Quick Evaluation

## 2022-01-31 NOTE — Progress Notes (Signed)
Spoke with pt for pre-op call. I had called her several times today and she was waiting for insurance approval. By late this afternoon, she was removed from the schedule. At 7 PM she was back on the schedule. Pt has hx of A-fib and is on Coumadin, she states her last dose 01/26/22. States she will be starting Eliquis after surgery instead of Coumadin. Pt is not diabetic.   Shower instructions given to pt and she voiced understanding.

## 2022-01-31 NOTE — Anesthesia Preprocedure Evaluation (Addendum)
Anesthesia Evaluation  Patient identified by MRN, date of birth, ID band Patient awake    Reviewed: Allergy & Precautions, NPO status , Patient's Chart, lab work & pertinent test results, reviewed documented beta blocker date and time   History of Anesthesia Complications Negative for: history of anesthetic complications  Airway Mallampati: II  TM Distance: >3 FB Neck ROM: Full    Dental  (+) Dental Advisory Given, Caps   Pulmonary former smoker   breath sounds clear to auscultation       Cardiovascular hypertension, Pt. on medications and Pt. on home beta blockers + CAD and + Peripheral Vascular Disease  + dysrhythmias Atrial Fibrillation  Rhythm:Irregular Rate:Normal  09/2021 ECHO: normal LVF, normal RVF, mild TR 10/2021 ETT: normal stress test   Neuro/Psych DDD: narcotics CVA, No Residual Symptoms  negative psych ROS   GI/Hepatic negative GI ROS, Neg liver ROS,,,  Endo/Other  negative endocrine ROS    Renal/GU Renal InsufficiencyRenal disease     Musculoskeletal  (+) Arthritis ,    Abdominal   Peds  Hematology Coumadin: last dose 5d ago   Anesthesia Other Findings   Reproductive/Obstetrics                             Anesthesia Physical Anesthesia Plan  ASA: 3  Anesthesia Plan: General   Post-op Pain Management: Tylenol PO (pre-op)*   Induction: Intravenous  PONV Risk Score and Plan: 3 and Ondansetron, Dexamethasone and Treatment may vary due to age or medical condition  Airway Management Planned: Oral ETT  Additional Equipment: None  Intra-op Plan:   Post-operative Plan: Extubation in OR  Informed Consent: I have reviewed the patients History and Physical, chart, labs and discussed the procedure including the risks, benefits and alternatives for the proposed anesthesia with the patient or authorized representative who has indicated his/her understanding and acceptance.      Dental advisory given  Plan Discussed with: CRNA and Surgeon  Anesthesia Plan Comments:         Anesthesia Quick Evaluation

## 2022-01-31 NOTE — Progress Notes (Signed)
Anesthesia Chart Review: Same day workup  Follows with cardiologist Dr. Nehemiah Massed at [place] for recent diagnosis of atrial fibrillation.  She also has history of moderate mitral insufficiency, HTN, HLD, TIA, carotid stenosis s/p right endarterectomy 09/21/2021. Patient was recently hospitalized for elective right carotid endarterectomy on 09/21/2021 and postoperatively had an episode of atrial fibrillation lasting approximately 5 hours with conversion to normal sinus rhythm after IV diltiazem. DOAC was deferred due to short episode of atrial fibrillation with spontaneous conversion.  Echo 09/20/2021 showed normal biventricular function with mild tricuspid regurgitation.  Subsequent Holter monitor showed evidence of short episodes of atrial fibrillation.  Due to CHA2DS2-VASc score of 6, decision was made for anticoagulation.  She was initially on Eliquis but later transitioned to Coumadin due to prohibitive cost.  Stress test 10/18/2021 showed normal perfusion without evidence of ischemia.  Per clearance from Dr. Nehemiah Massed dated 12/28/2021, patient was deemed low risk and cleared to hold Coumadin for 5 days prior to surgery.  Patient had follow-up with vascular surgeon Dr. Delana Meyer on 10/20/2021.  Duplex ultrasound at that time showed RICA 1-30% and LICA 86-57%.  Patient will need day of surgery labs and evaluation.  Carotid duplex 10/20/2021: Summary:  Right Carotid: Velocities in the right ICA are consistent with a 1-39% stenosis.  Left Carotid: Velocities in the left ICA are consistent with a 40-59% stenosis.  Vertebrals: Bilateral vertebral arteries demonstrate antegrade flow.  Subclavians: Normal flow hemodynamics were seen in bilateral subclavian arteries.   Nuclear stress 10/18/2021 (Care Everywhere): Indeterminant treadmill EKG due to baseline EKG changes  Normal myocardial perfusion without evidence of myocardial ischemia   TTE 09/20/2021 (Care Everywhere): INTERPRETATION  NORMAL LEFT  VENTRICULAR SYSTOLIC FUNCTION  NORMAL RIGHT VENTRICULAR SYSTOLIC FUNCTION  MILD VALVULAR REGURGITATION (See above)  NO VALVULAR STENOSIS  No embolic source seen     Molly Mitchell [facility] Short Stay Center/Anesthesiology Phone [phone] 01/31/2022 10:14 AM

## 2022-02-01 ENCOUNTER — Inpatient Hospital Stay (HOSPITAL_COMMUNITY): Payer: Medicare HMO | Admitting: Anesthesiology

## 2022-02-01 ENCOUNTER — Inpatient Hospital Stay (HOSPITAL_COMMUNITY): Payer: Medicare HMO

## 2022-02-01 ENCOUNTER — Encounter (HOSPITAL_COMMUNITY)
Admission: RE | Disposition: A | Discharge status: Home / Self Care | Payer: Self-pay | Source: Home / Self Care | Attending: Neurosurgery

## 2022-02-01 ENCOUNTER — Inpatient Hospital Stay (HOSPITAL_COMMUNITY)
Admission: RE | Disposition: A | Discharge status: Home / Self Care | Payer: Self-pay | Source: Home / Self Care | Attending: Neurosurgery

## 2022-02-01 ENCOUNTER — Inpatient Hospital Stay (HOSPITAL_COMMUNITY)
Admission: RE | Admit: 2022-02-01 | Discharge: 2022-02-02 | DRG: 457 | Disposition: A | Discharge status: Home / Self Care | Payer: Medicare HMO | Attending: Neurosurgery | Admitting: Neurosurgery

## 2022-02-01 ENCOUNTER — Encounter (HOSPITAL_COMMUNITY): Payer: Self-pay | Admitting: Neurosurgery

## 2022-02-01 DIAGNOSIS — Z8249 Family history of ischemic heart disease and other diseases of the circulatory system: Secondary | ICD-10-CM | POA: Diagnosis not present

## 2022-02-01 DIAGNOSIS — E785 Hyperlipidemia, unspecified: Secondary | ICD-10-CM | POA: Diagnosis present

## 2022-02-01 DIAGNOSIS — Z8673 Personal history of transient ischemic attack (TIA), and cerebral infarction without residual deficits: Secondary | ICD-10-CM | POA: Diagnosis not present

## 2022-02-01 DIAGNOSIS — I739 Peripheral vascular disease, unspecified: Secondary | ICD-10-CM | POA: Diagnosis present

## 2022-02-01 DIAGNOSIS — I10 Essential (primary) hypertension: Secondary | ICD-10-CM | POA: Diagnosis present

## 2022-02-01 DIAGNOSIS — M1990 Unspecified osteoarthritis, unspecified site: Secondary | ICD-10-CM | POA: Diagnosis present

## 2022-02-01 DIAGNOSIS — M1120 Other chondrocalcinosis, unspecified site: Secondary | ICD-10-CM | POA: Diagnosis present

## 2022-02-01 DIAGNOSIS — Z85828 Personal history of other malignant neoplasm of skin: Secondary | ICD-10-CM | POA: Diagnosis not present

## 2022-02-01 DIAGNOSIS — M4804 Spinal stenosis, thoracic region: Secondary | ICD-10-CM

## 2022-02-01 DIAGNOSIS — Z87891 Personal history of nicotine dependence: Secondary | ICD-10-CM

## 2022-02-01 DIAGNOSIS — I340 Nonrheumatic mitral (valve) insufficiency: Secondary | ICD-10-CM | POA: Diagnosis present

## 2022-02-01 DIAGNOSIS — G894 Chronic pain syndrome: Secondary | ICD-10-CM | POA: Diagnosis present

## 2022-02-01 DIAGNOSIS — M810 Age-related osteoporosis without current pathological fracture: Secondary | ICD-10-CM | POA: Diagnosis present

## 2022-02-01 DIAGNOSIS — Y793 Surgical instruments, materials and orthopedic devices (including sutures) associated with adverse incidents: Secondary | ICD-10-CM | POA: Diagnosis present

## 2022-02-01 DIAGNOSIS — M4854XA Collapsed vertebra, not elsewhere classified, thoracic region, initial encounter for fracture: Secondary | ICD-10-CM | POA: Diagnosis present

## 2022-02-01 DIAGNOSIS — Z8616 Personal history of COVID-19: Secondary | ICD-10-CM

## 2022-02-01 DIAGNOSIS — I2510 Atherosclerotic heart disease of native coronary artery without angina pectoris: Secondary | ICD-10-CM | POA: Diagnosis present

## 2022-02-01 DIAGNOSIS — M960 Pseudarthrosis after fusion or arthrodesis: Secondary | ICD-10-CM | POA: Diagnosis present

## 2022-02-01 DIAGNOSIS — I4891 Unspecified atrial fibrillation: Secondary | ICD-10-CM | POA: Diagnosis present

## 2022-02-01 DIAGNOSIS — Y838 Other surgical procedures as the cause of abnormal reaction of the patient, or of later complication, without mention of misadventure at the time of the procedure: Secondary | ICD-10-CM | POA: Diagnosis present

## 2022-02-01 DIAGNOSIS — Z7901 Long term (current) use of anticoagulants: Secondary | ICD-10-CM

## 2022-02-01 HISTORY — DX: Cardiac arrhythmia, unspecified: I49.9

## 2022-02-01 LAB — PROTIME-INR
INR: 1.1 (ref 0.8–1.2)
Prothrombin Time: 14.3 seconds (ref 11.4–15.2)

## 2022-02-01 LAB — TYPE AND SCREEN
ABO/RH(D): A POS
Antibody Screen: NEGATIVE

## 2022-02-01 LAB — CBC
HCT: 42.4 % (ref 36.0–46.0)
Hemoglobin: 14.5 g/dL (ref 12.0–15.0)
MCH: 30.5 pg (ref 26.0–34.0)
MCHC: 34.2 g/dL (ref 30.0–36.0)
MCV: 89.1 fL (ref 80.0–100.0)
Platelets: 287 10*3/uL (ref 150–400)
RBC: 4.76 MIL/uL (ref 3.87–5.11)
RDW: 15.6 % — ABNORMAL HIGH (ref 11.5–15.5)
WBC: 8.1 10*3/uL (ref 4.0–10.5)
nRBC: 0 % (ref 0.0–0.2)

## 2022-02-01 LAB — SURGICAL PCR SCREEN
MRSA, PCR: NEGATIVE
Staphylococcus aureus: NEGATIVE

## 2022-02-01 SURGERY — POSTERIOR LUMBAR FUSION 1 WITH HARDWARE REMOVAL
Anesthesia: General

## 2022-02-01 MED ORDER — SODIUM CHLORIDE 0.9% FLUSH: 3.0000 mL | INTRAVENOUS | Status: DC | PRN

## 2022-02-01 MED ORDER — BUPIVACAINE-EPINEPHRINE (PF) 0.5% -1:200000 IJ SOLN
INTRAMUSCULAR | Status: AC
  Filled 2022-02-01: qty 30

## 2022-02-01 MED ORDER — THROMBIN 20000 UNITS EX KIT
PACK | CUTANEOUS | Status: DC | PRN
  Administered 2022-02-01: 20 mL via TOPICAL

## 2022-02-01 MED ORDER — ONDANSETRON HCL 4 MG/2ML IJ SOLN
INTRAMUSCULAR | Status: AC
  Filled 2022-02-01: qty 4

## 2022-02-01 MED ORDER — SUGAMMADEX SODIUM 200 MG/2ML IV SOLN
INTRAVENOUS | Status: DC | PRN
  Administered 2022-02-01: 136 mg via INTRAVENOUS

## 2022-02-01 MED ORDER — HYDROCODONE-ACETAMINOPHEN 5-325 MG PO TABS: 1.0000 | ORAL_TABLET | ORAL | Status: DC | PRN

## 2022-02-01 MED ORDER — PHENYLEPHRINE 80 MCG/ML (10ML) SYRINGE FOR IV PUSH (FOR BLOOD PRESSURE SUPPORT)
PREFILLED_SYRINGE | INTRAVENOUS | Status: DC | PRN
  Administered 2022-02-01 (×2): 160 ug via INTRAVENOUS

## 2022-02-01 MED ORDER — DOCUSATE SODIUM 100 MG PO CAPS
100.0000 mg | ORAL_CAPSULE | Freq: Two times a day (BID) | ORAL | Status: DC
  Administered 2022-02-01 – 2022-02-02 (×2): 100 mg via ORAL
  Filled 2022-02-01 (×2): qty 1

## 2022-02-01 MED ORDER — PHENYLEPHRINE 80 MCG/ML (10ML) SYRINGE FOR IV PUSH (FOR BLOOD PRESSURE SUPPORT)
PREFILLED_SYRINGE | INTRAVENOUS | Status: AC
  Filled 2022-02-01: qty 10

## 2022-02-01 MED ORDER — ZOLPIDEM TARTRATE 5 MG PO TABS: 5.0000 mg | ORAL_TABLET | Freq: Every evening | ORAL | Status: DC | PRN

## 2022-02-01 MED ORDER — BUPIVACAINE LIPOSOME 1.3 % IJ SUSP
INTRAMUSCULAR | Status: DC | PRN
  Administered 2022-02-01: 20 mL

## 2022-02-01 MED ORDER — PHENOL 1.4 % MT LIQD: 1.0000 | OROMUCOSAL | Status: DC | PRN

## 2022-02-01 MED ORDER — OXYCODONE HCL 5 MG PO TABS: 5.0000 mg | ORAL_TABLET | ORAL | Status: DC | PRN

## 2022-02-01 MED ORDER — ONDANSETRON HCL 4 MG/2ML IJ SOLN: 4.0000 mg | Freq: Four times a day (QID) | INTRAMUSCULAR | Status: DC | PRN

## 2022-02-01 MED ORDER — MIDAZOLAM HCL 2 MG/2ML IJ SOLN: 0.5000 mg | Freq: Once | INTRAMUSCULAR | Status: DC | PRN

## 2022-02-01 MED ORDER — CYCLOBENZAPRINE HCL 10 MG PO TABS
10.0000 mg | ORAL_TABLET | Freq: Three times a day (TID) | ORAL | Status: DC | PRN
  Administered 2022-02-01 – 2022-02-02 (×2): 10 mg via ORAL
  Filled 2022-02-01 (×2): qty 1

## 2022-02-01 MED ORDER — BACITRACIN ZINC 500 UNIT/GM EX OINT
TOPICAL_OINTMENT | CUTANEOUS | Status: DC | PRN
  Administered 2022-02-01: 1 via TOPICAL

## 2022-02-01 MED ORDER — THROMBIN 5000 UNITS EX SOLR
CUTANEOUS | Status: AC
  Filled 2022-02-01: qty 5000

## 2022-02-01 MED ORDER — PRAVASTATIN SODIUM 40 MG PO TABS
40.0000 mg | ORAL_TABLET | Freq: Every day | ORAL | Status: DC
  Administered 2022-02-01: 40 mg via ORAL
  Filled 2022-02-01: qty 1

## 2022-02-01 MED ORDER — BUPIVACAINE-EPINEPHRINE (PF) 0.5% -1:200000 IJ SOLN
INTRAMUSCULAR | Status: DC | PRN
  Administered 2022-02-01: 10 mL via PERINEURAL

## 2022-02-01 MED ORDER — BACITRACIN ZINC 500 UNIT/GM EX OINT
TOPICAL_OINTMENT | CUTANEOUS | Status: AC
  Filled 2022-02-01: qty 28.35

## 2022-02-01 MED ORDER — ACETAMINOPHEN 500 MG PO TABS
1000.0000 mg | ORAL_TABLET | Freq: Once | ORAL | Status: AC
Start: 2022-02-01 — End: 2022-02-01
  Administered 2022-02-01: 1000 mg via ORAL
  Filled 2022-02-01: qty 2

## 2022-02-01 MED ORDER — IRBESARTAN 300 MG PO TABS
300.0000 mg | ORAL_TABLET | Freq: Every day | ORAL | Status: DC
  Administered 2022-02-01: 300 mg via ORAL
  Filled 2022-02-01 (×2): qty 1

## 2022-02-01 MED ORDER — ACETAMINOPHEN 500 MG PO TABS
1000.0000 mg | ORAL_TABLET | Freq: Four times a day (QID) | ORAL | Status: AC
  Administered 2022-02-01 – 2022-02-02 (×4): 1000 mg via ORAL
  Filled 2022-02-01 (×4): qty 2

## 2022-02-01 MED ORDER — SODIUM CHLORIDE 0.9 % IV SOLN
250.0000 mL | INTRAVENOUS | Status: DC
  Administered 2022-02-01: 250 mL via INTRAVENOUS

## 2022-02-01 MED ORDER — FENTANYL CITRATE (PF) 250 MCG/5ML IJ SOLN
INTRAMUSCULAR | Status: AC
  Filled 2022-02-01: qty 5

## 2022-02-01 MED ORDER — BISACODYL 10 MG RE SUPP: 10.0000 mg | Freq: Every day | RECTAL | Status: DC | PRN

## 2022-02-01 MED ORDER — LIDOCAINE 2% (20 MG/ML) 5 ML SYRINGE
INTRAMUSCULAR | Status: AC
  Filled 2022-02-01: qty 10

## 2022-02-01 MED ORDER — ACETAMINOPHEN 650 MG RE SUPP: 650.0000 mg | RECTAL | Status: DC | PRN

## 2022-02-01 MED ORDER — PROPOFOL 10 MG/ML IV BOLUS
INTRAVENOUS | Status: DC | PRN
  Administered 2022-02-01: 100 mg via INTRAVENOUS

## 2022-02-01 MED ORDER — PHENYLEPHRINE HCL-NACL 20-0.9 MG/250ML-% IV SOLN
INTRAVENOUS | Status: DC | PRN
  Administered 2022-02-01: 75 ug/min via INTRAVENOUS

## 2022-02-01 MED ORDER — ONDANSETRON HCL 4 MG PO TABS: 4.0000 mg | ORAL_TABLET | Freq: Four times a day (QID) | ORAL | Status: DC | PRN

## 2022-02-01 MED ORDER — LACTATED RINGERS IV SOLN: INTRAVENOUS | Status: DC

## 2022-02-01 MED ORDER — TIZANIDINE HCL 4 MG PO TABS
4.0000 mg | ORAL_TABLET | ORAL | Status: DC
  Administered 2022-02-02: 4 mg via ORAL
  Filled 2022-02-01: qty 1

## 2022-02-01 MED ORDER — 0.9 % SODIUM CHLORIDE (POUR BTL) OPTIME
TOPICAL | Status: DC | PRN
  Administered 2022-02-01: 1000 mL

## 2022-02-01 MED ORDER — MENTHOL 3 MG MT LOZG: 1.0000 | LOZENGE | OROMUCOSAL | Status: DC | PRN

## 2022-02-01 MED ORDER — ROCURONIUM BROMIDE 10 MG/ML (PF) SYRINGE
PREFILLED_SYRINGE | INTRAVENOUS | Status: DC | PRN
  Administered 2022-02-01: 40 mg via INTRAVENOUS
  Administered 2022-02-01 (×2): 50 mg via INTRAVENOUS

## 2022-02-01 MED ORDER — FENTANYL CITRATE (PF) 250 MCG/5ML IJ SOLN
INTRAMUSCULAR | Status: DC | PRN
  Administered 2022-02-01: 50 ug via INTRAVENOUS
  Administered 2022-02-01: 150 ug via INTRAVENOUS
  Administered 2022-02-01: 50 ug via INTRAVENOUS

## 2022-02-01 MED ORDER — CHLORHEXIDINE GLUCONATE 0.12 % MT SOLN
15.0000 mL | OROMUCOSAL | Status: AC
  Administered 2022-02-01: 15 mL via OROMUCOSAL
  Filled 2022-02-01: qty 15

## 2022-02-01 MED ORDER — OXYCODONE HCL 5 MG PO TABS: 5.0000 mg | ORAL_TABLET | Freq: Once | ORAL | Status: DC | PRN

## 2022-02-01 MED ORDER — CARVEDILOL 3.125 MG PO TABS
3.1250 mg | ORAL_TABLET | Freq: Two times a day (BID) | ORAL | Status: DC
  Administered 2022-02-01 – 2022-02-02 (×2): 3.125 mg via ORAL
  Filled 2022-02-01 (×2): qty 1

## 2022-02-01 MED ORDER — CEFAZOLIN SODIUM-DEXTROSE 2-4 GM/100ML-% IV SOLN
2.0000 g | Freq: Three times a day (TID) | INTRAVENOUS | Status: AC
  Administered 2022-02-01 – 2022-02-02 (×2): 2 g via INTRAVENOUS
  Filled 2022-02-01 (×2): qty 100

## 2022-02-01 MED ORDER — HYDROMORPHONE HCL 1 MG/ML IJ SOLN
INTRAMUSCULAR | Status: DC | PRN
  Administered 2022-02-01 (×2): .5 mg via INTRAVENOUS

## 2022-02-01 MED ORDER — SODIUM CHLORIDE 0.9% FLUSH
3.0000 mL | Freq: Two times a day (BID) | INTRAVENOUS | Status: DC
  Administered 2022-02-01: 3 mL via INTRAVENOUS

## 2022-02-01 MED ORDER — HYDROMORPHONE HCL 1 MG/ML IJ SOLN
INTRAMUSCULAR | Status: AC
  Filled 2022-02-01: qty 1

## 2022-02-01 MED ORDER — EPHEDRINE SULFATE-NACL 50-0.9 MG/10ML-% IV SOSY
PREFILLED_SYRINGE | INTRAVENOUS | Status: DC | PRN
  Administered 2022-02-01 (×2): 25 mg via INTRAVENOUS

## 2022-02-01 MED ORDER — PROPOFOL 500 MG/50ML IV EMUL
INTRAVENOUS | Status: DC | PRN
  Administered 2022-02-01: 75 ug/kg/min via INTRAVENOUS

## 2022-02-01 MED ORDER — ACETAMINOPHEN 325 MG PO TABS: 650.0000 mg | ORAL_TABLET | ORAL | Status: DC | PRN

## 2022-02-01 MED ORDER — THROMBIN 5000 UNITS EX SOLR
OROMUCOSAL | Status: DC | PRN
  Administered 2022-02-01: 5 mL via TOPICAL

## 2022-02-01 MED ORDER — HYDROMORPHONE HCL 1 MG/ML IJ SOLN
INTRAMUSCULAR | Status: AC
  Filled 2022-02-01: qty 0.5

## 2022-02-01 MED ORDER — ROCURONIUM BROMIDE 10 MG/ML (PF) SYRINGE
PREFILLED_SYRINGE | INTRAVENOUS | Status: AC
  Filled 2022-02-01: qty 30

## 2022-02-01 MED ORDER — LIDOCAINE 2% (20 MG/ML) 5 ML SYRINGE
INTRAMUSCULAR | Status: DC | PRN
  Administered 2022-02-01: 20 mg via INTRAVENOUS

## 2022-02-01 MED ORDER — EPHEDRINE 5 MG/ML INJ
INTRAVENOUS | Status: AC
  Filled 2022-02-01: qty 10

## 2022-02-01 MED ORDER — CHLORHEXIDINE GLUCONATE CLOTH 2 % EX PADS: 6.0000 | MEDICATED_PAD | Freq: Once | CUTANEOUS | Status: DC

## 2022-02-01 MED ORDER — VANCOMYCIN HCL IN DEXTROSE 1-5 GM/200ML-% IV SOLN
1000.0000 mg | INTRAVENOUS | Status: AC
  Administered 2022-02-01: 1000 mg via INTRAVENOUS
  Filled 2022-02-01: qty 200

## 2022-02-01 MED ORDER — MORPHINE SULFATE (PF) 4 MG/ML IV SOLN: 4.0000 mg | INTRAVENOUS | Status: DC | PRN

## 2022-02-01 MED ORDER — MEPERIDINE HCL 25 MG/ML IJ SOLN: 6.2500 mg | INTRAMUSCULAR | Status: DC | PRN

## 2022-02-01 MED ORDER — AMLODIPINE BESYLATE 2.5 MG PO TABS
2.5000 mg | ORAL_TABLET | Freq: Every day | ORAL | Status: DC
  Filled 2022-02-01: qty 1

## 2022-02-01 MED ORDER — OXYCODONE HCL 5 MG/5ML PO SOLN: 5.0000 mg | Freq: Once | ORAL | Status: DC | PRN

## 2022-02-01 MED ORDER — HYDROMORPHONE HCL 1 MG/ML IJ SOLN
0.2500 mg | INTRAMUSCULAR | Status: DC | PRN
  Administered 2022-02-01: 0.5 mg via INTRAVENOUS

## 2022-02-01 MED ORDER — DEXAMETHASONE SODIUM PHOSPHATE 10 MG/ML IJ SOLN
INTRAMUSCULAR | Status: DC | PRN
  Administered 2022-02-01: 10 mg via INTRAVENOUS

## 2022-02-01 MED ORDER — THROMBIN 20000 UNITS EX SOLR
CUTANEOUS | Status: AC
  Filled 2022-02-01: qty 20000

## 2022-02-01 MED ORDER — BUPIVACAINE LIPOSOME 1.3 % IJ SUSP
INTRAMUSCULAR | Status: AC
  Filled 2022-02-01: qty 20

## 2022-02-01 MED ORDER — DEXAMETHASONE SODIUM PHOSPHATE 10 MG/ML IJ SOLN
INTRAMUSCULAR | Status: AC
  Filled 2022-02-01: qty 2

## 2022-02-01 MED ORDER — MIDAZOLAM HCL 2 MG/2ML IJ SOLN
INTRAMUSCULAR | Status: AC
  Filled 2022-02-01: qty 2

## 2022-02-01 MED ORDER — MIDAZOLAM HCL 2 MG/2ML IJ SOLN
INTRAMUSCULAR | Status: DC | PRN
  Administered 2022-02-01: 2 mg via INTRAVENOUS

## 2022-02-01 MED ORDER — ONDANSETRON HCL 4 MG/2ML IJ SOLN
INTRAMUSCULAR | Status: DC | PRN
  Administered 2022-02-01: 4 mg via INTRAVENOUS

## 2022-02-01 MED ORDER — PROMETHAZINE HCL 25 MG/ML IJ SOLN: 6.2500 mg | INTRAMUSCULAR | Status: DC | PRN

## 2022-02-01 MED ORDER — OXYCODONE HCL 5 MG PO TABS
10.0000 mg | ORAL_TABLET | ORAL | Status: DC | PRN
  Administered 2022-02-01 – 2022-02-02 (×7): 10 mg via ORAL
  Filled 2022-02-01 (×7): qty 2

## 2022-02-01 SURGICAL SUPPLY — 71 items
BAG COUNTER SPONGE SURGICOUNT (BAG) ×1 IMPLANT
BASKET BONE COLLECTION (BASKET) ×1 IMPLANT
BENZOIN TINCTURE PRP APPL 2/3 (GAUZE/BANDAGES/DRESSINGS) ×1 IMPLANT
BLADE CLIPPER SURG (BLADE) IMPLANT
BONE FIBERS PLIAFX 10 (Bone Implant) ×2 IMPLANT
BUR MATCHSTICK NEURO 3.0 LAGG (BURR) ×1 IMPLANT
BUR PRECISION FLUTE 6.0 (BURR) ×1 IMPLANT
CANISTER SUCT 3000ML PPV (MISCELLANEOUS) ×1 IMPLANT
CANNULA FEN OPEN EXPEDIUM (Anchor) IMPLANT
CNTNR URN SCR LID CUP LEK RST (MISCELLANEOUS) ×1 IMPLANT
CONT SPEC 4OZ STRL OR WHT (MISCELLANEOUS) ×1
COVER BACK TABLE 60X90IN (DRAPES) ×1 IMPLANT
DRAPE C-ARM 42X72 X-RAY (DRAPES) ×2 IMPLANT
DRAPE HALF SHEET 40X57 (DRAPES) ×1 IMPLANT
DRAPE LAPAROTOMY 100X72X124 (DRAPES) ×1 IMPLANT
DRAPE SURG 17X23 STRL (DRAPES) ×4 IMPLANT
DRSG OPSITE POSTOP 4X10 (GAUZE/BANDAGES/DRESSINGS) IMPLANT
DRSG OPSITE POSTOP 4X6 (GAUZE/BANDAGES/DRESSINGS) ×1 IMPLANT
ELECT BLADE 4.0 EZ CLEAN MEGAD (MISCELLANEOUS) ×1
ELECT REM PT RETURN 9FT ADLT (ELECTROSURGICAL) ×1
ELECTRODE BLDE 4.0 EZ CLN MEGD (MISCELLANEOUS) ×1 IMPLANT
ELECTRODE REM PT RTRN 9FT ADLT (ELECTROSURGICAL) ×1 IMPLANT
EVACUATOR 1/8 PVC DRAIN (DRAIN) IMPLANT
GAUZE 4X4 16PLY ~~LOC~~+RFID DBL (SPONGE) ×1 IMPLANT
GLOVE BIO SURGEON STRL SZ 6 (GLOVE) ×1 IMPLANT
GLOVE BIO SURGEON STRL SZ 6.5 (GLOVE) IMPLANT
GLOVE BIO SURGEON STRL SZ8 (GLOVE) ×2 IMPLANT
GLOVE BIO SURGEON STRL SZ8.5 (GLOVE) ×2 IMPLANT
GLOVE BIOGEL PI IND STRL 6 (GLOVE) IMPLANT
GLOVE BIOGEL PI IND STRL 6.5 (GLOVE) ×1 IMPLANT
GLOVE BIOGEL PI IND STRL 8.5 (GLOVE) IMPLANT
GLOVE EXAM NITRILE XL STR (GLOVE) IMPLANT
GOWN STRL REUS W/ TWL LRG LVL3 (GOWN DISPOSABLE) ×1 IMPLANT
GOWN STRL REUS W/ TWL XL LVL3 (GOWN DISPOSABLE) ×2 IMPLANT
GOWN STRL REUS W/TWL 2XL LVL3 (GOWN DISPOSABLE) IMPLANT
GOWN STRL REUS W/TWL LRG LVL3 (GOWN DISPOSABLE) ×1
GOWN STRL REUS W/TWL XL LVL3 (GOWN DISPOSABLE) ×2
GRAFT BNE FBR PLIAFX PRIME 10 (Bone Implant) IMPLANT
HEMOSTAT POWDER KIT SURGIFOAM (HEMOSTASIS) ×1 IMPLANT
HEMOSTAT POWDER SURGIFOAM 1G (HEMOSTASIS) IMPLANT
KIT BASIN OR (CUSTOM PROCEDURE TRAY) ×1 IMPLANT
KIT GRAFTMAG DEL NEURO DISP (NEUROSURGERY SUPPLIES) IMPLANT
KIT TURNOVER KIT B (KITS) ×1 IMPLANT
NDL HYPO 21X1.5 SAFETY (NEEDLE) IMPLANT
NEEDLE HYPO 21X1.5 SAFETY (NEEDLE) IMPLANT
NEEDLE HYPO 22GX1.5 SAFETY (NEEDLE) ×1 IMPLANT
NS IRRIG 1000ML POUR BTL (IV SOLUTION) ×1 IMPLANT
PACK LAMINECTOMY NEURO (CUSTOM PROCEDURE TRAY) ×1 IMPLANT
PAD ARMBOARD 7.5X6 YLW CONV (MISCELLANEOUS) ×3 IMPLANT
PATTIES SURGICAL .5 X1 (DISPOSABLE) IMPLANT
ROD VIPER 2 STRAIGHT 480 (Rod) IMPLANT
SCREW CORT FIX FEN 5.5X7X40MM (Screw) IMPLANT
SCREW SET SINGLE INNER (Screw) IMPLANT
SLEEVE SURGEON STRL (DRAPES) IMPLANT
SPIKE FLUID TRANSFER (MISCELLANEOUS) ×1 IMPLANT
SPONGE NEURO XRAY DETECT 1X3 (DISPOSABLE) IMPLANT
SPONGE SURGIFOAM ABS GEL 100 (HEMOSTASIS) IMPLANT
SPONGE T-LAP 4X18 ~~LOC~~+RFID (SPONGE) IMPLANT
STRIP CLOSURE SKIN 1/2X4 (GAUZE/BANDAGES/DRESSINGS) ×1 IMPLANT
SUT VIC AB 1 CT1 18XBRD ANBCTR (SUTURE) ×2 IMPLANT
SUT VIC AB 1 CT1 8-18 (SUTURE) ×3
SUT VIC AB 2-0 CP2 18 (SUTURE) ×2 IMPLANT
SUT VIC AB 2-0 CT1 18 (SUTURE) IMPLANT
SYR 20ML LL LF (SYRINGE) IMPLANT
SYS CONFIDENCE SPINAL CEMENT (Cement) ×1 IMPLANT
SYSTEM CONFIDENCE SPINAL CEMNT (Cement) IMPLANT
TAP EXPEDIUM DL 6.0 (INSTRUMENTS) IMPLANT
TOWEL GREEN STERILE (TOWEL DISPOSABLE) ×1 IMPLANT
TOWEL GREEN STERILE FF (TOWEL DISPOSABLE) ×1 IMPLANT
TRAY FOLEY MTR SLVR 16FR STAT (SET/KITS/TRAYS/PACK) ×1 IMPLANT
WATER STERILE IRR 1000ML POUR (IV SOLUTION) ×1 IMPLANT

## 2022-02-01 NOTE — H&P (Signed)
Subjective: Patient is a 77 year old white female on whom I previous performed lumbar fusions.  She has developed worsening back pain.  She was worked up with a thoracic and lumbar CT which demonstrated proximal junctional kyphosis and pseudoarthrosis.  I discussed the treatment options.  She has decided to proceed with surgery.  Past Medical History:  Diagnosis Date   Acute right MCA stroke ([place]) 08/01/2021   a.) noted on MRI head 08/01/2021 --> punctate acute RIGHT MCA territory infarcts affecting the frontal and parietal contex consistent with microembolic infarcts in the RIGHT carotid circulation   Anemia    Arthritis    Bilateral carotid artery disease ([place]) 08/18/2021   a.) CTA neck 08/18/2021: 65-46% RICA and 50% LICA   Cervicalgia    Chronic kidney disease    Chronic pain syndrome    Chronic, continuous use of opioids    a.) hydrocodone/APAP (Norco 10/325 mg)   Connective tissue disease ([place])    pt reports being diagnosed with undifferentiated connective tissue disease at some point- pt took steroids for this and has had no issues since- 10 years ago   Coronary artery disease    Cortical senile cataract    COVID 2021   DDD (degenerative disc disease), cervical    a.) s/p C4-C6 ACDF   DDD (degenerative disc disease), lumbar    a.) s/p laminectomy + posterior L2-S1 fusion   Dysrhythmia    A-fib   History of 2019 novel coronavirus disease (COVID-19) 12/07/2018   History of blood transfusion    History of heart murmur in childhood    History of rheumatic fever    HLD (hyperlipidemia)    Hypertension    Lower extremity weakness    a.) with position changes from sitting to standing   Osteoporosis    Pseudogout    Shingles    Skin cancer of face    Spondylolisthesis of lumbar region     Past Surgical History:  Procedure Laterality Date   ABDOMINAL HYSTERECTOMY  1981   AIKEN OSTEOTOMY Right 01/30/2018   Procedure: WEIL RIGHT 2ND AND 3RD;  Surgeon: [name], DPM;   Location: [place];  Service: Podiatry;  Laterality: Right;   ANTERIOR CERVICAL DECOMP/DISCECTOMY FUSION N/A 2003   C4-C6   BACK SURGERY     pt reports hx of 3 lumber back surgeries total   CERVICAL SPINE SURGERY     pt reports having 3 cervical fusions total   ENDARTERECTOMY Right 09/21/2021   Procedure: ENDARTERECTOMY CAROTID;  Surgeon: Katha Cabal, MD;  Location: [facility];  Service: Vascular;  Laterality: Right;  Right carotid   HAMMER TOE SURGERY Right 01/30/2018   Procedure: HAMMER TOE CORRECTION SECOND AND THIRD;  Surgeon: [name], DPM;  Location: [place];  Service: Podiatry;  Laterality: Right;  GENERAL WITH LOCAL   HAMMER TOE SURGERY Left 03/05/2019   Procedure: HAMMER TOE CORRECTION T1 AND T2;  Surgeon: [name], DPM;  Location: [place];  Service: Podiatry;  Laterality: Left;   POSTERIOR LUMBAR FUSION N/A 2001   L2-S1   ROTATOR CUFF REPAIR Right 1981   SACROPLASTY N/A 03/29/2017   S1   TOTAL KNEE ARTHROPLASTY Left 03/03/2013   TOTAL KNEE REVISION Left 07/31/2016   Procedure: TOTAL KNEE REVISION;  Surgeon: Dereck Leep, MD;  Location: [facility];  Service: Orthopedics;  Laterality: Left;   WEIL OSTEOTOMY Left 03/05/2019   Procedure: WEIL OSTEOTOMY X 2 LEFT;  Surgeon: [name], DPM;  [facility];  Service: Podiatry;  Laterality: Left;    Allergies  Allergen Reactions   Amitriptyline Hcl Swelling    Facial swelling   Duricef [Cefadroxil] Swelling    Facial swelling   Levofloxacin Other (See Comments)    Severe Shoulder Pain   Tetanus Toxoid Swelling    Arm swelling   Tramadol Itching   Erythromycin Rash and Other (See Comments)    Rash on bottom    Social History   Tobacco Use   Smoking status: Former    Types: Cigarettes    Quit date: 04/17/1979    Years since quitting: 42.8   Smokeless tobacco: Never  Substance Use Topics   Alcohol use: No    Alcohol/week: 0.0 standard drinks of alcohol     Family History  Problem Relation Age of Onset   Hypertension Brother    Hypertension Mother    Hypertension Sister    Cancer Sister    Diabetes Paternal Grandmother    Heart failure Maternal Grandmother    Prior to Admission medications   Medication Sig Start Date End Date Taking? Authorizing Provider  amLODipine (NORVASC) 2.5 MG tablet Take 2.5 mg by mouth in the morning and at bedtime.   Yes [provider]  carvedilol (COREG) 3.125 MG tablet Take 3.125 mg by mouth 2 (two) times daily with a meal.   Yes [provider]  HYDROcodone-acetaminophen (NORCO/VICODIN) 5-325 MG tablet Take 1 tablet by mouth in the morning, at noon, in the evening, and at bedtime. 08/03/21  Yes [provider]  pravastatin (PRAVACHOL) 40 MG tablet Take 40 mg by mouth at bedtime.   Yes [provider]  telmisartan (MICARDIS) 80 MG tablet Take 80 mg by mouth daily.   Yes [provider]  tiZANidine (ZANAFLEX) 4 MG tablet Take 4 mg by mouth 2 (two) times a week. 09/20/21  Yes [provider]  warfarin (COUMADIN) 2.5 MG tablet Take 2.5 mg by mouth every evening. 01/02/22  Yes [provider]  amoxicillin (AMOXIL) 500 MG capsule Take 4 tablets by mouth 1 hour prior to dental procedures. Patient not taking: Reported on 01/04/2022 01/26/20   Chrismon, Vickki Muff, PA-C  metoprolol tartrate (LOPRESSOR) 100 MG tablet Take 1 tablet (100 mg) by mouth 2 hours prior to your Cardiac CT 01/04/22   Vickie Epley, MD     Review of Systems  Positive ROS: As above  All other systems have been reviewed and were otherwise negative with the exception of those mentioned in the HPI and as above.  Objective: Vital signs in last 24 hours: Temp:  [98.2 F (36.8 C)] 98.2 F (36.8 C) (01/17 0646) Pulse Rate:  [72] 72 (01/17 0646) Resp:  [16] 16 (01/17 0646) BP: (151)/(80) 151/80 (01/17 0646) SpO2:  [97 %] 97 % (01/17 0646) Weight:  [68 kg] 68 kg (01/17  0646) Estimated body mass index is 27.44 kg/m as calculated from the following:   Height as of this encounter: '5\' 2"'$  (1.575 m).   Weight as of this encounter: 68 kg.   General Appearance: Alert Head: Normocephalic, without obvious abnormality, atraumatic Eyes: PERRL, conjunctiva/corneas clear, EOM's intact,    Ears: Normal  Throat: Normal  Neck: Supple, Back: The patient's incision is well-healed  lungs: Clear to auscultation bilaterally, respirations unlabored Heart: Regular rate and rhythm, no murmur, rub or gallop Abdomen: Soft, non-tender Extremities: Extremities normal, atraumatic, no cyanosis or edema Skin: unremarkable  NEUROLOGIC:   Mental status: alert and oriented,Motor Exam -  grossly normal Sensory Exam - grossly normal Reflexes:  Coordination - grossly normal Gait - grossly normal Balance - grossly normal Cranial Nerves: I: smell Not tested  II: visual acuity  OS: Normal  OD: Normal   II: visual fields Full to confrontation  II: pupils Equal, round, reactive to light  III,VII: ptosis None  III,IV,VI: extraocular muscles  Full ROM  V: mastication Normal  V: facial light touch sensation  Normal  V,VII: corneal reflex  Present  VII: facial muscle function - upper  Normal  VII: facial muscle function - lower Normal  VIII: hearing Not tested  IX: soft palate elevation  Normal  IX,X: gag reflex Present  XI: trapezius strength  5/5  XI: sternocleidomastoid strength 5/5  XI: neck flexion strength  5/5  XII: tongue strength  Normal    Data Review Lab Results  Component Value Date   WBC 8.1 02/01/2022   HGB 14.5 02/01/2022   HCT 42.4 02/01/2022   MCV 89.1 02/01/2022   PLT 287 02/01/2022   Lab Results  Component Value Date   NA 140 01/04/2022   K 4.5 01/04/2022   CL 104 01/04/2022   CO2 26 01/04/2022   BUN 21 01/04/2022   CREATININE 0.90 01/04/2022   GLUCOSE 100 (H) 01/04/2022   Lab Results  Component Value Date   INR 1.1 02/01/2022     Assessment/Plan: Thoracic pseudoarthrosis, proximal junctional kyphosis, thoracic compression fracture: I have discussed the situation with the patient.  I reviewed recommendations with her and pointed out the abnormalities.  We have discussed the various treatment options including surgery.  I have described the surgical treatment option of an exploration of fusion with likely vertebroplasty/kyphoplasty and extension to T8.  I have described the surgery to her.  I have shown her surgical models.  We have discussed the risk, benefits, alternatives, expected postop course, and likelihood of achieving her goals with surgery.  I have answered all her questions.  She has decided to proceed with surgery.   Ophelia Charter 02/01/2022 8:25 AM

## 2022-02-01 NOTE — Transfer of Care (Signed)
Immediate Anesthesia Transfer of Care Note  Patient: Molly Mitchell  Procedure(s) Performed: Thoracic Nine-Thoracic Ten Decompressive Laminectomy, Explore Fusion, Extend Fusion and Instrumentation to Thoracic Eight  Patient Location: PACU  Anesthesia Type:General  Level of Consciousness: awake and drowsy  Airway & Oxygen Therapy: Patient Spontanous Breathing and Patient connected to face mask oxygen  Post-op Assessment: Report given to RN and Post -op Vital signs reviewed and stable  Post vital signs: Reviewed and stable  Last Vitals:  Vitals Value Taken Time  BP    Temp    Pulse    Resp    SpO2      Last Pain:  Vitals:   02/01/22 0656  TempSrc:   PainSc: 6       Patients Stated Pain Goal: 4 (12/90/47 5339)  Complications: No notable events documented.

## 2022-02-01 NOTE — Anesthesia Postprocedure Evaluation (Signed)
Anesthesia Post Note  Patient: LYRICK WORLAND  Procedure(s) Performed: Thoracic Nine-Thoracic Ten Decompressive Laminectomy, Explore Fusion, Extend Fusion and Instrumentation to Thoracic Eight     Patient location during evaluation: PACU Anesthesia Type: General Level of consciousness: patient cooperative, oriented and sedated Pain management: pain level controlled Vital Signs Assessment: post-procedure vital signs reviewed and stable Respiratory status: spontaneous breathing, nonlabored ventilation, respiratory function stable and patient connected to nasal cannula oxygen Cardiovascular status: blood pressure returned to baseline and stable Postop Assessment: no apparent nausea or vomiting Anesthetic complications: no   No notable events documented.  Last Vitals:  Vitals:   02/01/22 1500 02/01/22 1515  BP: (!) 123/56 (!) 127/51  Pulse: 77 74  Resp: 13 14  Temp:    SpO2:  91%    Last Pain:  Vitals:   02/01/22 1515  TempSrc:   PainSc: Asleep                 Aspynn Clover,E. Asya Derryberry

## 2022-02-01 NOTE — Anesthesia Procedure Notes (Signed)
Procedure Name: Intubation Date/Time: 02/01/2022 8:50 AM  Performed by: Minerva Ends, CRNAPre-anesthesia Checklist: Patient identified, Emergency Drugs available, Suction available and Patient being monitored Patient Re-evaluated:Patient Re-evaluated prior to induction Oxygen Delivery Method: Circle system utilized Preoxygenation: Pre-oxygenation with 100% oxygen Induction Type: IV induction Ventilation: Oral airway inserted - appropriate to patient size and Mask ventilation with difficulty Laryngoscope Size: Mac and 3 Grade View: Grade II Tube type: Oral Tube size: 7.0 mm Number of attempts: 1 Airway Equipment and Method: Stylet and Oral airway Placement Confirmation: ETT inserted through vocal cords under direct vision, positive ETCO2 and breath sounds checked- equal and bilateral Secured at: 22 cm Tube secured with: Tape Dental Injury: Teeth and Oropharynx as per pre-operative assessment

## 2022-02-01 NOTE — Op Note (Signed)
Brief history: The patient is a 77 year old white female who has had previous lumbar fusion by another physician and subsequently by me.  The patient never quite made a good recovery after her last surgery.  She failed medical management.  She was worked up with a thoracic CT and thoracic MRI which demonstrated findings consistent with spinal stenosis and pseudoarthrosis/proximal junctional kyphosis.  I discussed the various treatment options.  She has decided proceed with surgery.  Preoperative diagnosis: Proximal junctional kyphosis, T10 and T9 compression fracture, thoracic spinal stenosis, thoracic spine pain, thoracic pseudoarthrosis  Postoperative diagnosis: The same  Procedure: Bilateral T9-10 laminotomy/foraminotomies/medial facetectomy to decompress the thecal sac; posterior segmental instrumentation from T7 to L4 with DePuy titanium pedicle screws and rods; posterior lateral arthrodesis at T7-8, T8-9, T9-10, T10-11, T11-12, T12-L1, L1-2 and L2-3 with local morselized autograft bone bone graft extender; exploration of fusion/removal of bilateral L5 and S1 pedicle screws; cement augmentation/vertebroplasty T7 and T8  Surgeon: Dr. Earle Gell  Asst.: Arnetha Massy, NP  Anesthesia: Gen. endotracheal  Estimated blood loss: 250 cc  Drains: 1 medium Hemovac drain  Complications: None  Description of procedure: The patient was brought to the operating room by the anesthesia team. General endotracheal anesthesia was induced. The patient was turned to the prone position on the [place] table.  Her arms were tucked by her side. The patient's thoracolumbar and sacral region was then prepared with Betadine scrub and Betadine solution. Sterile drapes were applied.  I then injected the area to be incised with Marcaine with epinephrine solution. I then used the scalpel to make a linear midline incision over the T7-8 to L5-S1 interspace, incising through the old surgical scar. I then used  electrocautery to perform a bilateral subperiosteal dissection exposing the spinous process and lamina of T7 to the upper sacrum, and exposing the old hardware from T10 to the sacrum.  We then inserted the Verstrac and cerebellar retractors to provide exposure.  We explored the fusion by removing the caps from the screws and the rod connectors then removed the rods.  I inspected the arthrodesis from T10 to the sacrum.  She appeared to have a good arthrodesis at L3-4, L4-5 and L5-S1.  I could not identify a posterior lateral arthrodesis at the other levels.  I began the decompression by using the high speed drill to perform laminotomies at T9-10 bilaterally. We then used the Kerrison punches to widen the laminotomy and removed the ligamentum flavum at T9-10 bilaterally, decompressing the thecal sac.  We now turned attention to the instrumentation. Under fluoroscopic guidance we cannulated the bilateral T7, T8 and T9 pedicles with the bone probe. We then removed the bone probe. We then tapped the pedicle with a 6.0 millimeter tap. We then removed the tap. We probed inside the tapped pedicle with a ball probe to rule out cortical breaches. We then inserted a 7.0 x 40 millimeter pedicle screw into the T7, T8 and T9 pedicles bilaterally under fluoroscopic guidance.  We then injected bone cement through the pedicle screws bilaterally into the vertebral body at T7 and T8 under fluoroscopic guidance.  We then connected the unilateral pedicle screws with the rod we contoured the appropriate configuration. We secured the rod in place with the caps. We then tightened the caps appropriately. This completed the instrumentation from T7-L4 bilaterally.  We now turned our attention to the posterior lateral arthrodesis at T7-8, T8-9, T9-10 and the redo arthrodesis at T10-T11, T11-12, T12-L1, L1-2 and L2-3. We cleared the soft tissue  from the facets transverse process, etc. at these levels.  We then used the high-speed drill  to decorticate the remainder of the facets, pars, transverse process at T7-8, T8-9, T9-10, T10-11, T11-12, T12-L1, L1-2 and L2-3. We then applied a combination of local morselized autograft bone and DePuy bone graft extender over these decorticated posterior lateral structures. This completed the posterior lateral arthrodesis at T7-8, T8-9, T9-10, T10-11, T11-12, T12-L1, L1-2 and L2-3.  We then obtained hemostasis using bipolar electrocautery. We irrigated the wound out with saline solution. We inspected the thecal sac and nerve roots and noted they were well decompressed. We then removed the retractor.  We injected Exparel .  We placed a medium Hemovac drain in the prevertebral space and tunneled out through separate stab wound.  We reapproximated patient's thoracolumbar fascia with interrupted #1 Vicryl suture. We reapproximated patient's subcutaneous tissue with interrupted 2-0 Vicryl suture. The reapproximated patient's skin with Steri-Strips and benzoin. The wound was then coated with bacitracin ointment. A sterile dressing was applied. The drapes were removed. The patient was subsequently returned to the supine position where they were extubated by the anesthesia team. He was then transported to the post anesthesia care unit in stable condition. All sponge instrument and needle counts were reportedly correct at the end of this case.

## 2022-02-02 LAB — CBC
HCT: 29.3 % — ABNORMAL LOW (ref 36.0–46.0)
Hemoglobin: 10.2 g/dL — ABNORMAL LOW (ref 12.0–15.0)
MCH: 30.8 pg (ref 26.0–34.0)
MCHC: 34.8 g/dL (ref 30.0–36.0)
MCV: 88.5 fL (ref 80.0–100.0)
Platelets: 248 10*3/uL (ref 150–400)
RBC: 3.31 MIL/uL — ABNORMAL LOW (ref 3.87–5.11)
RDW: 15.8 % — ABNORMAL HIGH (ref 11.5–15.5)
WBC: 15.3 10*3/uL — ABNORMAL HIGH (ref 4.0–10.5)
nRBC: 0 % (ref 0.0–0.2)

## 2022-02-02 LAB — BASIC METABOLIC PANEL
Anion gap: 8 (ref 5–15)
BUN: 24 mg/dL — ABNORMAL HIGH (ref 8–23)
CO2: 25 mmol/L (ref 22–32)
Calcium: 8.4 mg/dL — ABNORMAL LOW (ref 8.9–10.3)
Chloride: 100 mmol/L (ref 98–111)
Creatinine, Ser: 1.11 mg/dL — ABNORMAL HIGH (ref 0.44–1.00)
GFR, Estimated: 52 mL/min — ABNORMAL LOW (ref >60)
Glucose, Bld: 137 mg/dL — ABNORMAL HIGH (ref 70–99)
Potassium: 4.9 mmol/L (ref 3.5–5.1)
Sodium: 133 mmol/L — ABNORMAL LOW (ref 135–145)

## 2022-02-02 MED ORDER — OXYCODONE-ACETAMINOPHEN 5-325 MG PO TABS: 1.0000 | ORAL_TABLET | ORAL | Status: DC | PRN

## 2022-02-02 MED ORDER — DOCUSATE SODIUM 100 MG PO CAPS: 100.0000 mg | ORAL_CAPSULE | Freq: Two times a day (BID) | ORAL | 0 refills | Status: DC

## 2022-02-02 MED ORDER — TIZANIDINE HCL 4 MG PO TABS: 4.0000 mg | ORAL_TABLET | ORAL | 0 refills | Status: DC

## 2022-02-02 MED ORDER — OXYCODONE-ACETAMINOPHEN 5-325 MG PO TABS: 1.0000 | ORAL_TABLET | ORAL | 0 refills | Status: DC | PRN

## 2022-02-02 MED FILL — Thrombin For Soln 20000 Unit: CUTANEOUS | Qty: 1 | Status: AC

## 2022-02-02 NOTE — Plan of Care (Signed)
Pt doing well. Pt and husband given D/C instructions with verbal understanding. Rx's were sent to the pharmacy by MD. Pt's incision is clean and dry with no sign of infection. Pt's IV and Hemovac were removed prior to D/C. Pt D/C'd home via wheelchair per MD order. Pt is stable @ D/C and has no other needs at this time. Holli Humbles, RN

## 2022-02-02 NOTE — Evaluation (Signed)
Physical Therapy Evaluation  Patient Details Name: Molly Mitchell MRN: [id number] DOB: August 14, 1945 Today's Date: 02/02/2022  History of Present Illness  This 77 yo female s/p bilateral T9-10 laminotomy/foraminotomies/medial facetectomy to decompress the thecal sac; posterior segmental instrumentation from T7 to L4; posterior lateral arthrodesis T7-L3; exploration of fusion/removal of bilateral L5 and S1 pedicle screws; cement augmentation/vertebroplasty T7 and T8.   Clinical Impression  Pt admitted with above diagnosis. At the time of PT eval, pt was able to demonstrate transfers and ambulation with gross min guard assist to supervision for safety and RW for support. Pt was educated on precautions, brace application/wearing schedule, appropriate activity progression, and car transfer. Brace adjusted for optimal fit. Pt currently with functional limitations due to the deficits listed below (see PT Problem List). Pt will benefit from skilled PT to increase their independence and safety with mobility to allow discharge to the venue listed below.         Recommendations for follow up therapy are one component of a multi-disciplinary discharge planning process, led by the attending physician.  Recommendations may be updated based on patient status, additional functional criteria and insurance authorization.  Follow Up Recommendations No PT follow up      Assistance Recommended at Discharge PRN  Patient can return home with the following  A little help with walking and/or transfers;A little help with bathing/dressing/bathroom;Assistance with cooking/housework;Assist for transportation;Help with stairs or ramp for entrance    Equipment Recommendations None recommended by PT  Recommendations for Other Services       Functional Status Assessment Patient has had a recent decline in their functional status and demonstrates the ability to make significant improvements in function in a reasonable and  predictable amount of time.     Precautions / Restrictions Precautions Precautions: Back Precaution Booklet Issued: Yes (comment) Precaution Comments: Reviewed handout and pt was cued for precautions during functional mobility. Required Braces or Orthoses: Spinal Brace Spinal Brace: Lumbar corset Restrictions Weight Bearing Restrictions: No      Mobility  Bed Mobility Overal bed mobility: Modified Independent                  Transfers Overall transfer level: Needs assistance Equipment used: Rolling walker (2 wheels) Transfers: Sit to/from Stand Sit to Stand: Supervision           General transfer comment: Light supervision as pt powered up to full stand. No assist required and no unsteadiness noted. VC's for hand placement on seated surface for safety.    Ambulation/Gait Ambulation/Gait assistance: Supervision Gait Distance (Feet): 400 Feet Assistive device: Rolling walker (2 wheels) Gait Pattern/deviations: Step-through pattern, Decreased stride length, Trunk flexed Gait velocity: Decreased Gait velocity interpretation: 1.31 - 2.62 ft/sec, indicative of limited community ambulator   General Gait Details: VC's for improved posture, closer walker proximity, and forward gaze. No assist required.  Stairs            Wheelchair Mobility    Modified Rankin (Stroke Patients Only)       Balance Overall balance assessment: Needs assistance Sitting-balance support: Feet supported, No upper extremity supported Sitting balance-Leahy Scale: Fair     Standing balance support: During functional activity, Bilateral upper extremity supported, Reliant on assistive device for balance Standing balance-Leahy Scale: Fair Standing balance comment: Statically able to stand without UE support                             Pertinent  Vitals/Pain Pain Assessment Pain Assessment: Faces Faces Pain Scale: Hurts little more Pain Location: incisional Pain  Descriptors / Indicators: Aching, Sore Pain Intervention(s): Limited activity within patient's tolerance, Monitored during session, Repositioned    Home Living Family/patient expects to be discharged to:: Private residence Living Arrangements: Spouse/significant other Available Help at Discharge: Family;Available 24 hours/day Type of Home: House Home Access: Stairs to enter Entrance Stairs-Rails: Can reach both Entrance Stairs-Number of Steps: 4   Home Layout: One level Home Equipment: Cane - single point;Shower seat;Grab bars - tub/shower;Adaptive equipment;Toilet riser      Prior Function Prior Level of Function : Independent/Modified Independent                     Hand Dominance   Dominant Hand: Right    Extremity/Trunk Assessment   Upper Extremity Assessment Upper Extremity Assessment: Defer to OT evaluation    Lower Extremity Assessment Lower Extremity Assessment: Generalized weakness (Mild; consistent with pre-op diagnosis)    Cervical / Trunk Assessment Cervical / Trunk Assessment: Back Surgery  Communication   Communication: No difficulties  Cognition Arousal/Alertness: Awake/alert Behavior During Therapy: WFL for tasks assessed/performed Overall Cognitive Status: Within Functional Limits for tasks assessed                                          General Comments      Exercises     Assessment/Plan    PT Assessment Patient needs continued PT services  PT Problem List Decreased strength;Decreased range of motion;Decreased activity tolerance;Decreased balance;Decreased mobility;Decreased knowledge of use of DME;Decreased safety awareness;Decreased knowledge of precautions;Pain       PT Treatment Interventions Gait training;DME instruction;Stair training;Functional mobility training;Therapeutic activities;Therapeutic exercise;Patient/family education;Balance training    PT Goals (Current goals can be found in the Care Plan  section)  Acute Rehab PT Goals Patient Stated Goal: Home today PT Goal Formulation: With patient Time For Goal Achievement: 02/09/22 Potential to Achieve Goals: Good    Frequency Min 5X/week     Co-evaluation               AM-PAC PT "6 Clicks" Mobility  Outcome Measure Help needed turning from your back to your side while in a flat bed without using bedrails?: None Help needed moving from lying on your back to sitting on the side of a flat bed without using bedrails?: None Help needed moving to and from a bed to a chair (including a wheelchair)?: A Little Help needed standing up from a chair using your arms (e.g., wheelchair or bedside chair)?: A Little Help needed to walk in hospital room?: A Little Help needed climbing 3-5 steps with a railing? : A Little 6 Click Score: 20    End of Session Equipment Utilized During Treatment: Gait belt;Back brace Activity Tolerance: Patient tolerated treatment well Patient left: in bed;with call bell/phone within reach Nurse Communication: Mobility status PT Visit Diagnosis: Unsteadiness on feet (R26.81);Pain Pain - part of body:  (back)    Time: 4854-6270 PT Time Calculation (min) (ACUTE ONLY): 23 min   Charges:   PT Evaluation $PT Eval Low Complexity: 1 Low PT Treatments $Gait Training: 8-22 mins        Rolinda Roan, PT, DPT Acute Rehabilitation Services Secure Chat Preferred Office: [phone]   Thelma Comp 02/02/2022, 1:45 PM

## 2022-02-02 NOTE — Discharge Summary (Signed)
Physician Discharge Summary  Patient ID: Molly Mitchell MRN: [id number] DOB/AGE: 1945-03-12 77 y.o.  Admit date: 02/01/2022 Discharge date: 02/02/2022  Admission Diagnoses: Proximal junctional kyphosis, thoracic compression fracture, thoracic pseudoarthrosis, thoracic spinal stenosis, thoracic spine pain  Discharge Diagnoses: The same Principal Problem:   Pseudoarthrosis of thoracic spine after fusion procedure   Discharged Condition: good  Hospital Course: Perform an exploration of the patient's lumbar fusion with T9-10 decompression and instrumentation and fusion to T7 on 02/01/2022.  The surgery went well.  The patient's postoperative course was unremarkable.  On postoperative day #1 the patient felt much better and requested discharge to home.  The patient, and her husband, were given verbal and written discharge instructions.  All her questions were answered.  Consults: PT T, OT, care management Significant Diagnostic Studies: None Treatments: Exploration of lumbar fusion, T7-L4 instrumentation and fusion with cement augmentation Discharge Exam: Blood pressure (!) 102/41, pulse 65, temperature 98.4 F (36.9 C), temperature source Oral, resp. rate 16, height '5\' 2"'$  (1.575 m), weight 68 kg, SpO2 96 %. The patient is alert and pleasant.  She looks well.  Her dressing is clean and dry.  Her strength is normal.  Disposition: Home  Discharge Instructions     Call MD for:  difficulty breathing, headache or visual disturbances   Complete by: As directed    Call MD for:  extreme fatigue   Complete by: As directed    Call MD for:  hives   Complete by: As directed    Call MD for:  persistant dizziness or light-headedness   Complete by: As directed    Call MD for:  persistant nausea and vomiting   Complete by: As directed    Call MD for:  redness, tenderness, or signs of infection (pain, swelling, redness, odor or green/yellow discharge around incision site)   Complete by: As directed     Call MD for:  severe uncontrolled pain   Complete by: As directed    Call MD for:  temperature >100.4   Complete by: As directed    Diet - low sodium heart healthy   Complete by: As directed    Discharge instructions   Complete by: As directed    Call [phone] for a followup appointment. Take a stool softener while you are using pain medications.   Driving Restrictions   Complete by: As directed    Do not drive for 2 weeks.   Increase activity slowly   Complete by: As directed    Lifting restrictions   Complete by: As directed    Do not lift more than 5 pounds. No excessive bending or twisting.   May shower / Bathe   Complete by: As directed    Remove the dressing for 3 days after surgery.  You may shower, but leave the incision alone.   Remove dressing in 48 hours   Complete by: As directed       Allergies as of 02/02/2022       Reactions   Amitriptyline Hcl Swelling   Facial swelling   Duricef [cefadroxil] Swelling   Facial swelling   Levofloxacin Other (See Comments)   Severe Shoulder Pain   Tetanus Toxoid Swelling   Arm swelling   Tramadol Itching   Erythromycin Rash, Other (See Comments)   Rash on bottom        Medication List     STOP taking these medications    HYDROcodone-acetaminophen 5-325 MG tablet Commonly known as: NORCO/VICODIN  TAKE these medications    amLODipine 2.5 MG tablet Commonly known as: NORVASC Take 2.5 mg by mouth in the morning and at bedtime.   amoxicillin 500 MG capsule Commonly known as: AMOXIL Take 4 tablets by mouth 1 hour prior to dental procedures.   carvedilol 3.125 MG tablet Commonly known as: COREG Take 3.125 mg by mouth 2 (two) times daily with a meal.   docusate sodium 100 MG capsule Commonly known as: COLACE Take 1 capsule (100 mg total) by mouth 2 (two) times daily.   metoprolol tartrate 100 MG tablet Commonly known as: LOPRESSOR Take 1 tablet (100 mg) by mouth 2 hours prior to your Cardiac  CT   oxyCODONE-acetaminophen 5-325 MG tablet Commonly known as: PERCOCET/ROXICET Take 1-2 tablets by mouth every 4 (four) hours as needed for moderate pain.   pravastatin 40 MG tablet Commonly known as: PRAVACHOL Take 40 mg by mouth at bedtime.   telmisartan 80 MG tablet Commonly known as: MICARDIS Take 80 mg by mouth daily.   tiZANidine 4 MG tablet Commonly known as: ZANAFLEX Take 1 tablet (4 mg total) by mouth 2 (two) times a week. Start taking on: February 06, 2022   warfarin 2.5 MG tablet Commonly known as: COUMADIN Take 2.5 mg by mouth every evening.         Signed: Ophelia Charter 02/02/2022, 12:51 PM

## 2022-02-02 NOTE — Evaluation (Signed)
Occupational Therapy Evaluation and Discharge Patient Details Name: Molly Mitchell MRN: [id number] DOB: 04-09-1945 Today's Date: 02/02/2022   History of Present Illness This 77 yo female s/p bilateral T9-10 laminotomy/foraminotomies/medial facetectomy to decompress the thecal sac; posterior segmental instrumentation from T7 to L4; posterior lateral arthrodesis at T7-8, T8-9, T9-10, T10-11, T11-12, T12-L1, L1-2 and L2-3; exploration of fusion/removal of bilateral L5 and S1 pedicle screws; cement augmentation/vertebroplasty T7 and T8.   Clinical Impression   This 77 yo female admitted with above presents to acute OT with all education completed, we will D/C from acute OT.      Recommendations for follow up therapy are one component of a multi-disciplinary discharge planning process, led by the attending physician.  Recommendations may be updated based on patient status, additional functional criteria and insurance authorization.   Follow Up Recommendations  No OT follow up     Assistance Recommended at Discharge PRN  Patient can return home with the following A little help with bathing/dressing/bathroom;Assistance with cooking/housework;Assist for transportation    Functional Status Assessment  Patient has had a recent decline in their functional status and demonstrates the ability to make significant improvements in function in a reasonable and predictable amount of time. (but does not require additional OT services, all education completed)  Equipment Recommendations  None recommended by OT       Precautions / Restrictions Precautions Precautions: Back Precaution Booklet Issued: Yes (comment) Required Braces or Orthoses: Spinal Brace Spinal Brace: Lumbar corset Restrictions Weight Bearing Restrictions: No      Mobility Bed Mobility Overal bed mobility: Modified Independent                          ADL either performed or assessed with clinical judgement   ADL                                          General ADL Comments: Educated pt on use of sock aid to donn socks. Pt already has a reacher for other LBD. Pt has a toilet riser that she has been using. Educated on how she should try to side step into tub instead of forward stepping, using 2 cups for brushing teeth. Pt reports her husband can A her prn.     Vision Baseline Vision/History: 1 Wears glasses Patient Visual Report: No change from baseline              Pertinent Vitals/Pain Pain Assessment Pain Assessment: Faces Faces Pain Scale: Hurts little more Pain Location: incisional Pain Descriptors / Indicators: Aching, Sore Pain Intervention(s): Limited activity within patient's tolerance, Monitored during session, Repositioned     Hand Dominance Right   Extremity/Trunk Assessment Upper Extremity Assessment Upper Extremity Assessment: Overall WFL for tasks assessed           Communication Communication Communication: No difficulties   Cognition Arousal/Alertness: Awake/alert Behavior During Therapy: WFL for tasks assessed/performed Overall Cognitive Status: Within Functional Limits for tasks assessed                                                  Home Living Family/patient expects to be discharged to:: Private residence Living Arrangements: Spouse/significant other Available Help at Discharge: Family;Available 24  hours/day Type of Home: House Home Access: Stairs to enter [employer] of Steps: 4 Entrance Stairs-Rails: Can reach both Home Layout: One level     Bathroom Shower/Tub: Teacher, early years/pre: Standard     Home Equipment: Cane - single point;Shower seat;Grab bars - tub/shower;Adaptive equipment;Toilet riser Adaptive Equipment: Reacher;Long-handled sponge        Prior Functioning/Environment Prior Level of Function : Independent/Modified Independent                        OT  Problem List: Decreased strength;Decreased range of motion;Impaired balance (sitting and/or standing);Pain         OT Goals(Current goals can be found in the care plan section) Acute Rehab OT Goals Patient Stated Goal: to go home today         AM-PAC OT "6 Clicks" Daily Activity     Outcome Measure Help from another person eating meals?: None Help from another person taking care of personal grooming?: None Help from another person toileting, which includes using toliet, bedpan, or urinal?: None Help from another person bathing (including washing, rinsing, drying)?: A Little   Help from another person to put on and taking off regular lower body clothing?: A Little 6 Click Score: 18   End of Session Nurse Communication:  (no further OT needs)  Activity Tolerance: Patient tolerated treatment well Patient left: in bed;with call bell/phone within reach  OT Visit Diagnosis: Pain Pain - part of body:  (incisional)                Time: 1552-0802 OT Time Calculation (min): 22 min Charges:  OT General Charges $OT Visit: 1 Visit OT Evaluation $OT Eval Moderate Complexity: 1 Mod  [place] Circle, OTR/L [facility] [phone] Office [phone]    Almon Register 02/02/2022, 12:28 PM

## 2022-02-08 MED FILL — Heparin Sodium (Porcine) Inj 1000 Unit/ML: INTRAMUSCULAR | Qty: 30 | Status: AC

## 2022-02-08 MED FILL — Sodium Chloride IV Soln 0.9%: INTRAVENOUS | Qty: 2000 | Status: AC

## 2022-02-14 ENCOUNTER — Encounter: Payer: Self-pay | Admitting: Cardiology

## 2022-02-21 MED ORDER — APIXABAN 5 MG PO TABS: 5.0000 mg | ORAL_TABLET | Freq: Two times a day (BID) | ORAL | Status: DC

## 2022-03-07 ENCOUNTER — Telehealth: Payer: Self-pay

## 2022-03-07 ENCOUNTER — Other Ambulatory Visit: Payer: Self-pay

## 2022-03-07 DIAGNOSIS — I4891 Unspecified atrial fibrillation: Secondary | ICD-10-CM

## 2022-03-07 NOTE — Telephone Encounter (Signed)
See 02/14/22 MyChart messages.  Called for an update after ortho appointments last week.  Left message to call back.

## 2022-03-07 NOTE — Telephone Encounter (Signed)
Molly Mitchell reports she saw Dr. Arnoldo Morale last week and he said she is doing wonderfully and healing as expected.  Confirmed she is still on Eliquis. She wishes to proceed with LAAO on 4/4 with shared decision visit with Dr. Audie Box as scheduled 3/6.  Will request clearance letter from Dr. Arnoldo Morale that is OK from Neurosurgery perspective to proceed with Watchman implant as planned on 04/20/22. Return clearance to:  [facility] Attn: Valetta Fuller, RN Phone: [phone] Fax: [phone]

## 2022-03-10 ENCOUNTER — Telehealth: Payer: Self-pay

## 2022-03-20 NOTE — Progress Notes (Unsigned)
Cardiology Office Note:   Date:  03/22/2022  NAME:  Molly Mitchell    MRN: [id number] DOB:  1946/01/11   PCP:  [place]  Cardiologist:  None  Electrophysiologist:  Vickie Epley, MD   Referring MD: [place]   Chief Complaint  Patient presents with   Atrial Fibrillation    History of Present Illness:   Molly Mitchell is a 77 y.o. female with a hx of paroxysmal A-fib, stroke, carotid artery disease status post right CEA, coronary calcium who presents for shared watchman decision.  Plan for left atrial appendage occlusion in April.  She had a one-time episode of atrial fibrillation after carotid endarterectomy surgery.  Has had no further recurrence.  She reports she is struggled with GI bleed as well as cost of medication.  She reports she does not want to be on a blood thinner long-term due to the risk of bleeding as well as cost.  She reports no chest pain or trouble breathing.  She reports she does have back issues.  She cannot take NSAIDs while on Eliquis.  Again this is another reason to come off Eliquis and pursue watchman.  Her examination is unremarkable.  EKG shows sinus rhythm.  She is well versed in the Watchman procedure.  She is willing to proceed.  I think this is quite reasonable.  She has a very high CHA2DS2-VASc score.  She had a stroke.  She follows with [facility] cardiology.  Her blood pressure is a bit elevated today.  She tells me she did not take her medication.  She should keep an eye on this as values have been high on recent checks.  Problem List Paroxysmal Afib  -CHADSVASC=7 CVA Carotid artery disease  -R CEA 09/21/2021 4. GI bleed  5. Coronary calcium -CAC 43.7 (48th percentile) -T chol 213, HDL 61, LDL 116. TG 184  Past Medical History: Past Medical History:  Diagnosis Date   Acute right MCA stroke ([place]) 08/01/2021   a.) noted on MRI head 08/01/2021 --> punctate acute RIGHT MCA territory infarcts affecting the frontal and parietal  contex consistent with microembolic infarcts in the RIGHT carotid circulation   Anemia    Arthritis    Bilateral carotid artery disease ([place]) 08/18/2021   a.) CTA neck 08/18/2021: [id number] RICA and [id number] LICA   Cervicalgia    Chronic kidney disease    Chronic pain syndrome    Chronic, continuous use of opioids    a.) hydrocodone/APAP (Norco 10/325 mg)   Connective tissue disease ([place])    pt reports being diagnosed with undifferentiated connective tissue disease at some point- pt took steroids for this and has had no issues since- 10 years ago   Coronary artery disease    Cortical senile cataract    COVID 2021   DDD (degenerative disc disease), cervical    a.) s/p C4-C6 ACDF   DDD (degenerative disc disease), lumbar    a.) s/p laminectomy + posterior L2-S1 fusion   Dysrhythmia    A-fib   History of 2019 novel coronavirus disease (COVID-19) 12/07/2018   History of blood transfusion    History of heart murmur in childhood    History of rheumatic fever    HLD (hyperlipidemia)    Hypertension    Lower extremity weakness    a.) with position changes from sitting to standing   Osteoporosis    Pseudogout    Shingles    Skin cancer of face    Spondylolisthesis  of lumbar region     Past Surgical History: Past Surgical History:  Procedure Laterality Date   ABDOMINAL HYSTERECTOMY  1981   AIKEN OSTEOTOMY Right 01/30/2018   Procedure: WEIL RIGHT 2ND AND 3RD;  Surgeon: [name], DPM;  Location: [facility];  Service: Podiatry;  Laterality: Right;   ANTERIOR CERVICAL DECOMP/DISCECTOMY FUSION N/A 2003   C4-C6   BACK SURGERY     pt reports hx of 3 lumber back surgeries total   CERVICAL SPINE SURGERY     pt reports having 3 cervical fusions total   ENDARTERECTOMY Right 09/21/2021   Procedure: ENDARTERECTOMY CAROTID;  Surgeon: Katha Cabal, MD;  Location: [facility];  Service: Vascular;  Laterality: Right;  Right carotid   HAMMER TOE SURGERY Right 01/30/2018   Procedure:  HAMMER TOE CORRECTION SECOND AND THIRD;  Surgeon: [name], DPM;  Location: [place];  Service: Podiatry;  Laterality: Right;  GENERAL WITH LOCAL   HAMMER TOE SURGERY Left 03/05/2019   Procedure: HAMMER TOE CORRECTION T1 AND T2;  Surgeon: [name], DPM;  Location: [place];  Service: Podiatry;  Laterality: Left;   POSTERIOR LUMBAR FUSION N/A 2001   L2-S1   ROTATOR CUFF REPAIR Right 1981   SACROPLASTY N/A 03/29/2017   S1   TOTAL KNEE ARTHROPLASTY Left 03/03/2013   TOTAL KNEE REVISION Left 07/31/2016   Procedure: TOTAL KNEE REVISION;  Surgeon: Dereck Leep, MD;  Location: [facility];  Service: Orthopedics;  Laterality: Left;   WEIL OSTEOTOMY Left 03/05/2019   Procedure: WEIL OSTEOTOMY X 2 LEFT;  Surgeon: [name], DPM;  Location: [place];  Service: Podiatry;  Laterality: Left;    Current Medications: Current Meds  Medication Sig   amLODipine (NORVASC) 2.5 MG tablet Take 2.5 mg by mouth in the morning and at bedtime.   amoxicillin (AMOXIL) 500 MG capsule Take 4 tablets by mouth 1 hour prior to dental procedures.   apixaban (ELIQUIS) 5 MG TABS tablet Take 1 tablet (5 mg total) by mouth 2 (two) times daily.   carvedilol (COREG) 3.125 MG tablet Take 3.125 mg by mouth 2 (two) times daily with a meal.   HYDROcodone-acetaminophen (NORCO/VICODIN) 5-325 MG tablet Take 2 tablets by mouth every 6 (six) hours as needed.   pravastatin (PRAVACHOL) 40 MG tablet Take 40 mg by mouth at bedtime.   telmisartan (MICARDIS) 80 MG tablet Take 80 mg by mouth daily.   tiZANidine (ZANAFLEX) 4 MG tablet Take 1 tablet (4 mg total) by mouth 2 (two) times a week.     Allergies:    Amitriptyline hcl, Duricef [cefadroxil], Levofloxacin, Tetanus toxoid, Tramadol, and Erythromycin   Social History: Social History   Socioeconomic History   Marital status: Married    Spouse name: Eddie Dibbles   Number of children: 2   Years of education: Not on file   Highest education  level: High school graduate  Occupational History   Occupation: retired   Occupation: Retired [employer]  Tobacco Use   Smoking status: Former    Packs/day: 1.00    Years: 15.00    Total pack years: 15.00    Types: Cigarettes    Quit date: 04/17/1979    Years since quitting: 42.9   Smokeless tobacco: Never  Vaping Use   Vaping Use: Never used  Substance and Sexual Activity   Alcohol use: No    Alcohol/week: 0.0 standard drinks of alcohol   Drug use: No   Sexual activity: Not  on file  Other Topics Concern   Not on file  Social History Narrative   Not on file   Social Determinants of Health   Financial Resource Strain: Low Risk  (03/23/2020)   Overall Financial Resource Strain (CARDIA)    Difficulty of Paying Living Expenses: Not hard at all  Food Insecurity: No Food Insecurity (09/21/2021)   Hunger Vital Sign    Worried About Running Out of Food in the Last Year: Never true    Ran Out of Food in the Last Year: Never true  Transportation Needs: No Transportation Needs (09/21/2021)   PRAPARE - Hydrologist (Medical): No    Lack of Transportation (Non-Medical): No  Physical Activity: Sufficiently Active (03/23/2020)   Exercise Vital Sign    Days of Exercise per Week: 3 days    Minutes of Exercise per Session: 60 min  Stress: No Stress Concern Present (03/23/2020)   [place]    Feeling of Stress : Not at all  Social Connections: [place] (03/23/2020)   Social Connection and Isolation Panel [NHANES]    Frequency of Communication with Friends and Family: Never    Frequency of Social Gatherings with Friends and Family: More than three times a week    Attends Religious Services: More than 4 times per year    Active Member of [employer] or Organizations: Yes    Attends Music therapist: More than 4 times per year    Marital Status: Married      Family History: The patient's family history includes Cancer in her sister; Diabetes in her paternal grandmother; Heart failure in her maternal grandmother; Hypertension in her brother, mother, and sister.  ROS:   All other ROS reviewed and negative. Pertinent positives noted in the HPI.     EKGs/Labs/Other Studies Reviewed:   The following studies were personally reviewed by me today:  EKG:  EKG is ordered today.  The ekg ordered today demonstrates normal sinus rhythm heart rate 84, no acute ischemic changes or evidence of infarction, and was personally reviewed by me.   Recent Labs: 09/22/2021: Magnesium 2.4; TSH 0.410 02/02/2022: BUN 24; Creatinine, Ser 1.11; Hemoglobin 10.2; Platelets 248; Potassium 4.9; Sodium 133   Recent Lipid Panel    Component Value Date/Time   CHOL 220 (H) 05/31/2020 1007   TRIG 100 05/31/2020 1007   HDL 76 05/31/2020 1007   CHOLHDL 2.9 05/31/2020 1007   CHOLHDL 3.1 09/26/2016 1108   LDLCALC 127 (H) 05/31/2020 1007   LDLCALC 113 (H) 09/26/2016 1108    Physical Exam:   VS:  BP (!) 162/86 (BP Location: Left Arm, Patient Position: Sitting, Cuff Size: Normal)   Pulse 84   Ht '5\' 2"'$  (1.575 m)   Wt 155 lb 12.8 oz (70.7 kg)   SpO2 97%   BMI 28.50 kg/m    Wt Readings from Last 3 Encounters:  03/22/22 155 lb 12.8 oz (70.7 kg)  02/01/22 150 lb (68 kg)  01/04/22 155 lb 8 oz (70.5 kg)    General: Well nourished, well developed, in no acute distress Head: Atraumatic, normal size  Eyes: PEERLA, EOMI  Neck: Supple, no JVD Endocrine: No thryomegaly Cardiac: Normal S1, S2; RRR; no murmurs, rubs, or gallops Lungs: Clear to auscultation bilaterally, no wheezing, rhonchi or rales  Abd: Soft, nontender, no hepatomegaly  Ext: No edema, pulses 2+ Musculoskeletal: No deformities, BUE and BLE strength normal and equal Skin: Warm and  dry, no rashes   Neuro: Alert and oriented to person, place, time, and situation, CNII-XII grossly intact, no focal deficits  Psych:  Normal mood and affect   ASSESSMENT:   Molly Mitchell is a 77 y.o. female who presents for the following: 1. Paroxysmal atrial fibrillation (HCC)   2. Acquired thrombophilia ([place])     PLAN:   1. Paroxysmal atrial fibrillation (HCC) 2. Acquired thrombophilia ([place]) -Molly Mitchell is a 77 y.o. female is being referred to the [facility] Team for evaluation for Left Atrial Appendage Closure with Watchman device for the management of stroke risk resulting form non-valvular atrial fibrillation.  Based upon Molly Mitchell's history, she is felt to be a poor candidate for long-term anticoagulation because of a history of bleeding (e.g. intracerebral, subdural, GI, retro-peritoneal) and intolerance to oral anticoagulation.  The patient has a HAS-BLED score of 5 indicating a Yearly Major Bleeding Risk of 12.5%. Her CHADS2-VASc Score is 7 with an unadjusted Ischemic Stroke Rate (% per year) of 11.2%.   -Her stroke risk necessitates a strategy of stroke prevention with either long-term oral anticoagulation or left atrial appendage occlusion therapy. We have discussed their bleeding risk in the context of their comorbid medical problems, as well as the rationale for referral for evaluation of Watchman left atrial appendage occlusion therapy. While the patient is at high long-term bleeding risk, they may be appropriate for short-term anticoagulation. Based on this individual patient's stroke and bleeding risk, a shared decision has been made to refer the patient for consideration of Watchman left atrial appendage closure utilizing the [employer] of Cardiology shared decision tool. -She will continue current medications and keep an eye on her blood pressure.  She will see [place] back as needed.  He follows with the [facility] cardiology clinic. -She will complete preoperative blood work today.  Disposition: Return if symptoms worsen or fail to improve.  Medication Adjustments/Labs and Tests  Ordered: Current medicines are reviewed at length with the patient today.  Concerns regarding medicines are outlined above.  Orders Placed This Encounter  Procedures   Basic metabolic panel   CBC   EKG 12-Lead   No orders of the defined types were placed in this encounter.   Patient Instructions  Medication Instructions:  The current medical regimen is effective;  continue present plan and medications.  *If you need a refill on your cardiac medications before your next appointment, please call your pharmacy*   Lab Work: BMET, CBC today   If you have labs (blood work) drawn today and your tests are completely normal, you will receive your results only by: Eva (if you have MyChart) OR A paper copy in the mail If you have any lab test that is abnormal or we need to change your treatment, we will call you to review the results.   Follow-Up: At [facility], you and your health needs are our priority.  As part of our continuing mission to provide you with exceptional heart care, we have created designated Provider Care Teams.  These Care Teams include your primary Cardiologist (physician) and Advanced Practice Providers (APPs -  Physician Assistants and Nurse Practitioners) who all work together to provide you with the care you need, when you need it.  We recommend signing up for the patient portal called "MyChart".  Sign up information is provided on this After Visit Summary.  MyChart is used to connect with patients for Virtual Visits (Telemedicine).  Patients are  able to view lab/test results, encounter notes, upcoming appointments, etc.  Non-urgent messages can be sent to your provider as well.   To learn more about what you can do with MyChart, go to [email or web address].    Your next appointment:   As needed  Provider:   Eleonore Chiquito, MD      Time Spent with Patient: I have spent a total of 25 minutes with patient reviewing hospital notes, telemetry,  EKGs, labs and examining the patient as well as establishing an assessment and plan that was discussed with the patient.  > 50% of time was spent in direct patient care.  Signed, Addison Naegeli. Audie Box, MD, [place]  [address], [place], [place] [address] [phone]  03/22/2022 2:10 PM

## 2022-03-22 ENCOUNTER — Ambulatory Visit: Payer: Medicare HMO | Attending: Cardiovascular Disease | Admitting: Cardiovascular Disease

## 2022-03-22 ENCOUNTER — Encounter: Payer: Self-pay | Admitting: Cardiovascular Disease

## 2022-03-22 VITALS — BP 162/86 | HR 84 | Ht 62.0 in | Wt 155.8 lb

## 2022-03-22 DIAGNOSIS — D6869 Other thrombophilia: Secondary | ICD-10-CM

## 2022-03-22 DIAGNOSIS — I480 Paroxysmal atrial fibrillation: Secondary | ICD-10-CM

## 2022-03-22 NOTE — Patient Instructions (Signed)
Medication Instructions:  The current medical regimen is effective;  continue present plan and medications.  *If you need a refill on your cardiac medications before your next appointment, please call your pharmacy*   Lab Work: BMET, CBC today   If you have labs (blood work) drawn today and your tests are completely normal, you will receive your results only by: [place] (if you have MyChart) OR A paper copy in the mail If you have any lab test that is abnormal or we need to change your treatment, we will call you to review the results.   Follow-Up: At [facility], you and your health needs are our priority.  As part of our continuing mission to provide you with exceptional heart care, we have created designated Provider Care Teams.  These Care Teams include your primary Cardiologist (physician) and Advanced Practice Providers (APPs -  Physician Assistants and Nurse Practitioners) who all work together to provide you with the care you need, when you need it.  We recommend signing up for the patient portal called "MyChart".  Sign up information is provided on this After Visit Summary.  MyChart is used to connect with patients for Virtual Visits (Telemedicine).  Patients are able to view lab/test results, encounter notes, upcoming appointments, etc.  Non-urgent messages can be sent to your provider as well.   To learn more about what you can do with MyChart, go to [email or web address].    Your next appointment:   As needed  Provider:   Eleonore Chiquito, MD

## 2022-03-23 LAB — CBC
Hematocrit: 36.8 % (ref 34.0–46.6)
Hemoglobin: 12.1 g/dL (ref 11.1–15.9)
MCH: 29.8 pg (ref 26.6–33.0)
MCHC: 32.9 g/dL (ref 31.5–35.7)
MCV: 91 fL (ref 79–97)
Platelets: 340 10*3/uL (ref 150–450)
RBC: 4.06 x10E6/uL (ref 3.77–5.28)
RDW: 14.6 % (ref 11.7–15.4)
WBC: 9.6 10*3/uL (ref 3.4–10.8)

## 2022-03-23 LAB — BASIC METABOLIC PANEL
BUN/Creatinine Ratio: 20 (ref 12–28)
BUN: 23 mg/dL (ref 8–27)
CO2: 20 mmol/L (ref 20–29)
Calcium: 10.1 mg/dL (ref 8.7–10.3)
Chloride: 98 mmol/L (ref 96–106)
Creatinine, Ser: 1.14 mg/dL — ABNORMAL HIGH (ref 0.57–1.00)
Glucose: 92 mg/dL (ref 70–99)
Potassium: 5.1 mmol/L (ref 3.5–5.2)
Sodium: 137 mmol/L (ref 134–144)
eGFR: 50 mL/min/{1.73_m2} — ABNORMAL LOW (ref >59)

## 2022-04-11 ENCOUNTER — Encounter: Payer: Self-pay | Admitting: Cardiology

## 2022-04-17 ENCOUNTER — Telehealth: Payer: Self-pay

## 2022-04-17 NOTE — Telephone Encounter (Signed)
Confirmed procedure date of 04/20/2022. Confirmed arrival time of 0700 for procedure time at 0930. Reviewed pre-procedure instructions with patient. The patient understands to call if questions/concerns arise prior to procedure.

## 2022-04-20 ENCOUNTER — Inpatient Hospital Stay (HOSPITAL_COMMUNITY): Payer: Medicare HMO

## 2022-04-20 ENCOUNTER — Inpatient Hospital Stay (HOSPITAL_COMMUNITY): Payer: Medicare HMO | Admitting: Anesthesiology

## 2022-04-20 ENCOUNTER — Other Ambulatory Visit: Payer: Self-pay

## 2022-04-20 ENCOUNTER — Encounter (HOSPITAL_COMMUNITY): Payer: Self-pay | Admitting: Cardiology

## 2022-04-20 ENCOUNTER — Inpatient Hospital Stay (HOSPITAL_COMMUNITY)
Admission: RE | Admit: 2022-04-20 | Discharge: 2022-04-20 | DRG: 274 | Disposition: A | Discharge status: Home / Self Care | Payer: Medicare HMO | Attending: Cardiology | Admitting: Cardiology

## 2022-04-20 ENCOUNTER — Encounter (HOSPITAL_COMMUNITY)
Admission: RE | Disposition: A | Discharge status: Home / Self Care | Payer: Self-pay | Source: Home / Self Care | Attending: Cardiology

## 2022-04-20 DIAGNOSIS — M810 Age-related osteoporosis without current pathological fracture: Secondary | ICD-10-CM | POA: Diagnosis present

## 2022-04-20 DIAGNOSIS — E1122 Type 2 diabetes mellitus with diabetic chronic kidney disease: Secondary | ICD-10-CM | POA: Diagnosis not present

## 2022-04-20 DIAGNOSIS — M549 Dorsalgia, unspecified: Secondary | ICD-10-CM | POA: Diagnosis not present

## 2022-04-20 DIAGNOSIS — I2510 Atherosclerotic heart disease of native coronary artery without angina pectoris: Secondary | ICD-10-CM | POA: Diagnosis present

## 2022-04-20 DIAGNOSIS — Z96652 Presence of left artificial knee joint: Secondary | ICD-10-CM | POA: Diagnosis present

## 2022-04-20 DIAGNOSIS — Z981 Arthrodesis status: Secondary | ICD-10-CM

## 2022-04-20 DIAGNOSIS — I4891 Unspecified atrial fibrillation: Secondary | ICD-10-CM

## 2022-04-20 DIAGNOSIS — E785 Hyperlipidemia, unspecified: Secondary | ICD-10-CM | POA: Diagnosis present

## 2022-04-20 DIAGNOSIS — Z833 Family history of diabetes mellitus: Secondary | ICD-10-CM | POA: Diagnosis not present

## 2022-04-20 DIAGNOSIS — Z8673 Personal history of transient ischemic attack (TIA), and cerebral infarction without residual deficits: Secondary | ICD-10-CM | POA: Diagnosis not present

## 2022-04-20 DIAGNOSIS — Z8249 Family history of ischemic heart disease and other diseases of the circulatory system: Secondary | ICD-10-CM

## 2022-04-20 DIAGNOSIS — I129 Hypertensive chronic kidney disease with stage 1 through stage 4 chronic kidney disease, or unspecified chronic kidney disease: Secondary | ICD-10-CM | POA: Diagnosis not present

## 2022-04-20 DIAGNOSIS — Z87891 Personal history of nicotine dependence: Secondary | ICD-10-CM | POA: Diagnosis not present

## 2022-04-20 DIAGNOSIS — Z8719 Personal history of other diseases of the digestive system: Secondary | ICD-10-CM

## 2022-04-20 DIAGNOSIS — N183 Chronic kidney disease, stage 3 unspecified: Secondary | ICD-10-CM | POA: Diagnosis present

## 2022-04-20 DIAGNOSIS — Z85828 Personal history of other malignant neoplasm of skin: Secondary | ICD-10-CM

## 2022-04-20 DIAGNOSIS — Z006 Encounter for examination for normal comparison and control in clinical research program: Secondary | ICD-10-CM

## 2022-04-20 DIAGNOSIS — Z79891 Long term (current) use of opiate analgesic: Secondary | ICD-10-CM

## 2022-04-20 DIAGNOSIS — Z8619 Personal history of other infectious and parasitic diseases: Secondary | ICD-10-CM | POA: Diagnosis not present

## 2022-04-20 DIAGNOSIS — I10 Essential (primary) hypertension: Secondary | ICD-10-CM

## 2022-04-20 DIAGNOSIS — Z881 Allergy status to other antibiotic agents status: Secondary | ICD-10-CM

## 2022-04-20 DIAGNOSIS — Z7901 Long term (current) use of anticoagulants: Secondary | ICD-10-CM

## 2022-04-20 DIAGNOSIS — I6529 Occlusion and stenosis of unspecified carotid artery: Secondary | ICD-10-CM | POA: Diagnosis present

## 2022-04-20 DIAGNOSIS — N1831 Chronic kidney disease, stage 3a: Secondary | ICD-10-CM | POA: Diagnosis present

## 2022-04-20 DIAGNOSIS — Z95818 Presence of other cardiac implants and grafts: Secondary | ICD-10-CM

## 2022-04-20 DIAGNOSIS — H25019 Cortical age-related cataract, unspecified eye: Secondary | ICD-10-CM | POA: Diagnosis present

## 2022-04-20 DIAGNOSIS — Z791 Long term (current) use of non-steroidal anti-inflammatories (NSAID): Secondary | ICD-10-CM

## 2022-04-20 DIAGNOSIS — K922 Gastrointestinal hemorrhage, unspecified: Secondary | ICD-10-CM | POA: Diagnosis present

## 2022-04-20 DIAGNOSIS — Z888 Allergy status to other drugs, medicaments and biological substances status: Secondary | ICD-10-CM

## 2022-04-20 DIAGNOSIS — Z8616 Personal history of COVID-19: Secondary | ICD-10-CM | POA: Diagnosis not present

## 2022-04-20 DIAGNOSIS — Z887 Allergy status to serum and vaccine status: Secondary | ICD-10-CM

## 2022-04-20 DIAGNOSIS — I480 Paroxysmal atrial fibrillation: Principal | ICD-10-CM | POA: Diagnosis present

## 2022-04-20 DIAGNOSIS — M1990 Unspecified osteoarthritis, unspecified site: Secondary | ICD-10-CM | POA: Diagnosis present

## 2022-04-20 DIAGNOSIS — G894 Chronic pain syndrome: Secondary | ICD-10-CM | POA: Diagnosis present

## 2022-04-20 DIAGNOSIS — Z90710 Acquired absence of both cervix and uterus: Secondary | ICD-10-CM

## 2022-04-20 DIAGNOSIS — Z885 Allergy status to narcotic agent status: Secondary | ICD-10-CM

## 2022-04-20 HISTORY — DX: Presence of other cardiac implants and grafts: Z95.818

## 2022-04-20 HISTORY — PX: LEFT ATRIAL APPENDAGE OCCLUSION: EP1229

## 2022-04-20 HISTORY — PX: TEE WITHOUT CARDIOVERSION: SHX5443

## 2022-04-20 LAB — POCT ACTIVATED CLOTTING TIME: Activated Clotting Time: 363 seconds

## 2022-04-20 LAB — TYPE AND SCREEN
ABO/RH(D): A POS
Antibody Screen: NEGATIVE

## 2022-04-20 LAB — SURGICAL PCR SCREEN
MRSA, PCR: NEGATIVE
Staphylococcus aureus: NEGATIVE

## 2022-04-20 LAB — ECHO TEE

## 2022-04-20 SURGERY — LEFT ATRIAL APPENDAGE OCCLUSION
Anesthesia: General

## 2022-04-20 MED ORDER — MIDAZOLAM HCL 2 MG/2ML IJ SOLN
INTRAMUSCULAR | Status: DC | PRN
  Administered 2022-04-20: 2 mg via INTRAVENOUS

## 2022-04-20 MED ORDER — PROMETHAZINE HCL 25 MG/ML IJ SOLN
INTRAMUSCULAR | Status: DC | PRN
  Administered 2022-04-20: 12.5 mg via INTRAVENOUS

## 2022-04-20 MED ORDER — FENTANYL CITRATE (PF) 100 MCG/2ML IJ SOLN
INTRAMUSCULAR | Status: AC
  Filled 2022-04-20: qty 2

## 2022-04-20 MED ORDER — CHLORHEXIDINE GLUCONATE 0.12 % MT SOLN
OROMUCOSAL | Status: AC
  Administered 2022-04-20: 15 mL
  Filled 2022-04-20: qty 15

## 2022-04-20 MED ORDER — SODIUM CHLORIDE 0.9 % IV SOLN: INTRAVENOUS | Status: DC

## 2022-04-20 MED ORDER — ACETAMINOPHEN 325 MG PO TABS: 650.0000 mg | ORAL_TABLET | ORAL | Status: DC | PRN

## 2022-04-20 MED ORDER — HEPARIN SODIUM (PORCINE) 1000 UNIT/ML IJ SOLN
INTRAMUSCULAR | Status: DC | PRN
  Administered 2022-04-20: 10000 [IU] via INTRAVENOUS

## 2022-04-20 MED ORDER — PHENYLEPHRINE 80 MCG/ML (10ML) SYRINGE FOR IV PUSH (FOR BLOOD PRESSURE SUPPORT)
PREFILLED_SYRINGE | INTRAVENOUS | Status: DC | PRN
  Administered 2022-04-20: 80 ug via INTRAVENOUS

## 2022-04-20 MED ORDER — LIDOCAINE 2% (20 MG/ML) 5 ML SYRINGE
INTRAMUSCULAR | Status: DC | PRN
  Administered 2022-04-20: 80 mg via INTRAVENOUS

## 2022-04-20 MED ORDER — IOHEXOL 350 MG/ML SOLN
INTRAVENOUS | Status: DC | PRN
  Administered 2022-04-20: 20 mL

## 2022-04-20 MED ORDER — SODIUM CHLORIDE 0.9% FLUSH: 3.0000 mL | INTRAVENOUS | Status: DC | PRN

## 2022-04-20 MED ORDER — TIZANIDINE HCL 4 MG PO TABS: 4.0000 mg | ORAL_TABLET | Freq: Two times a day (BID) | ORAL | Status: DC

## 2022-04-20 MED ORDER — ONDANSETRON HCL 4 MG/2ML IJ SOLN: 4.0000 mg | Freq: Four times a day (QID) | INTRAMUSCULAR | Status: DC | PRN

## 2022-04-20 MED ORDER — ACETAMINOPHEN 500 MG PO TABS: 1000.0000 mg | ORAL_TABLET | Freq: Once | ORAL | Status: DC

## 2022-04-20 MED ORDER — PROTAMINE SULFATE 10 MG/ML IV SOLN
INTRAVENOUS | Status: DC | PRN
  Administered 2022-04-20: 30 mg via INTRAVENOUS

## 2022-04-20 MED ORDER — ROCURONIUM BROMIDE 10 MG/ML (PF) SYRINGE
PREFILLED_SYRINGE | INTRAVENOUS | Status: DC | PRN
  Administered 2022-04-20: 60 mg via INTRAVENOUS

## 2022-04-20 MED ORDER — MIDAZOLAM HCL 2 MG/2ML IJ SOLN
INTRAMUSCULAR | Status: AC
  Filled 2022-04-20: qty 2

## 2022-04-20 MED ORDER — ONDANSETRON HCL 4 MG/2ML IJ SOLN
INTRAMUSCULAR | Status: DC | PRN
  Administered 2022-04-20: 4 mg via INTRAVENOUS

## 2022-04-20 MED ORDER — HEPARIN (PORCINE) IN NACL 2000-0.9 UNIT/L-% IV SOLN
INTRAVENOUS | Status: DC | PRN
  Administered 2022-04-20: 1000 mL

## 2022-04-20 MED ORDER — HEPARIN (PORCINE) IN NACL 1000-0.9 UT/500ML-% IV SOLN
INTRAVENOUS | Status: DC | PRN
  Administered 2022-04-20: 500 mL

## 2022-04-20 MED ORDER — DEXAMETHASONE SODIUM PHOSPHATE 10 MG/ML IJ SOLN
INTRAMUSCULAR | Status: DC | PRN
  Administered 2022-04-20: 10 mg via INTRAVENOUS

## 2022-04-20 MED ORDER — PROPOFOL 500 MG/50ML IV EMUL
INTRAVENOUS | Status: DC | PRN
  Administered 2022-04-20: 50 ug/kg/min via INTRAVENOUS

## 2022-04-20 MED ORDER — PROMETHAZINE HCL 25 MG/ML IJ SOLN
INTRAMUSCULAR | Status: AC
  Filled 2022-04-20: qty 1

## 2022-04-20 MED ORDER — SUGAMMADEX SODIUM 200 MG/2ML IV SOLN
INTRAVENOUS | Status: DC | PRN
  Administered 2022-04-20: 131.6 mg via INTRAVENOUS

## 2022-04-20 MED ORDER — PHENYLEPHRINE HCL-NACL 20-0.9 MG/250ML-% IV SOLN
INTRAVENOUS | Status: DC | PRN
  Administered 2022-04-20: 40 ug/min via INTRAVENOUS

## 2022-04-20 MED ORDER — SODIUM CHLORIDE 0.9 % IV SOLN: 250.0000 mL | INTRAVENOUS | Status: DC | PRN

## 2022-04-20 MED ORDER — PROPOFOL 10 MG/ML IV BOLUS
INTRAVENOUS | Status: DC | PRN
  Administered 2022-04-20: 120 mg via INTRAVENOUS

## 2022-04-20 MED ORDER — VANCOMYCIN HCL IN DEXTROSE 1-5 GM/200ML-% IV SOLN
1000.0000 mg | INTRAVENOUS | Status: AC
  Administered 2022-04-20: 1000 mg via INTRAVENOUS
  Filled 2022-04-20: qty 200

## 2022-04-20 MED ORDER — FENTANYL CITRATE (PF) 250 MCG/5ML IJ SOLN
INTRAMUSCULAR | Status: DC | PRN
  Administered 2022-04-20: 50 ug via INTRAVENOUS

## 2022-04-20 MED ORDER — SODIUM CHLORIDE 0.9% FLUSH: 3.0000 mL | Freq: Two times a day (BID) | INTRAVENOUS | Status: DC

## 2022-04-20 MED ORDER — LACTATED RINGERS IV SOLN: INTRAVENOUS | Status: DC

## 2022-04-20 SURGICAL SUPPLY — 18 items
CATH INFINITI 5FR ANG PIGTAIL (CATHETERS) IMPLANT
CLOSURE PERCLOSE PROSTYLE (VASCULAR PRODUCTS) IMPLANT
DEVICE WATCHMAN FLX PROC (KITS) IMPLANT
DILATOR VESSEL 38 20CM 14FR (INTRODUCER) IMPLANT
KIT HEART LEFT (KITS) ×1 IMPLANT
KIT SHEA VERSACROSS LAAC CONNE (KITS) IMPLANT
PACK CARDIAC CATHETERIZATION (CUSTOM PROCEDURE TRAY) ×1 IMPLANT
PAD DEFIB RADIO PHYSIO CONN (PAD) ×1 IMPLANT
SHEATH PERFORMER 16FR 30 (SHEATH) IMPLANT
SHEATH PINNACLE 8F 10CM (SHEATH) IMPLANT
SHEATH PROBE COVER 6X72 (BAG) ×1 IMPLANT
SYS WATCHMAN FXD DBL (SHEATH) ×1
SYSTEM WATCHMAN FXD DBL (SHEATH) IMPLANT
TRANSDUCER W/STOPCOCK (MISCELLANEOUS) ×1 IMPLANT
TUBING CIL FLEX 10 FLL-RA (TUBING) ×1 IMPLANT
WATCHMAN FLX 27 (Prosthesis & Implant Heart) IMPLANT
WATCHMAN FLX PROCEDURE DEVICE (KITS) ×1 IMPLANT
WATCHMAN PROCED TRUSEAL ACCESS (SHEATH) IMPLANT

## 2022-04-20 NOTE — Progress Notes (Signed)
Ambulated along the  hallway with walker, tolerated well.

## 2022-04-20 NOTE — Discharge Summary (Signed)
[facility]    Patient ID: Molly Mitchell,  MRN: [id number], DOB/AGE: 06/20/1945 77 y.o.  Admit date: 04/20/2022 Discharge date: 04/20/2022  Primary Care Physician: [place]  Primary Cardiologist: None  Electrophysiologist: Vickie Epley, MD  Primary Discharge Diagnosis:  Paroxysmal Atrial Fibrillation Poor candidacy for long term anticoagulation due to h/o GI bleeding  Secondary Discharge Diagnosis:  -Carotid stenosis s/p R CEA -CVA  Procedures This Admission:  Transeptal Puncture Intra-procedural TEE which showed no LAA thrombus Left atrial appendage occlusive device placement on 04/20/22 by Dr. Quentin Ore.   Brief HPI: Molly Mitchell is a 77 y.o. female with a history of paroxysmal A-fib, stroke, carotid artery disease status post right CEA, coronary calcium who was referred to Dr. Quentin Ore for the evaluation of [place] closure with Watchman.   [name] had a one-time episode of atrial fibrillation after carotid endarterectomy surgery with no further recurrence however was placed on anticoagulation with Eliquis. She has issues with GI bleeding while on [facility] and was referred to Dr. Quentin Ore for Watchman evaluation to avoid long term AC.   CT imaging showed anatomy suitable for LAAO closure which was scheduled for 04/20/22.  Hospital Course:  The patient was admitted and underwent left atrial appendage occlusive device placement with 59mm Watchman FLX device. She was monitored post procedure and has done well therefore is being considered for same day discharge later today. Groin site has been without complication on the day of discharge. Wound care and restrictions were reviewed with the patient. The patient has been scheduled for post procedure follow up with Kathyrn Drown, NP in approximately 1 month. She will restart Eliquis later today and continue through 45days. At that time, she will stop and start Plavix 75mg  QD through 6 months. She will require dental  SBE during this time. Plan for repeat CT at 60 days post implant to ensure adequate device seal and no thrombus.   Physical Exam: Vitals:   04/20/22 1215 04/20/22 1300 04/20/22 1330 04/20/22 1400  BP: 139/61 129/70 92/80 (!) 105/48  Pulse: 74 69 85 62  Resp: 16 14 18 11   Temp:      TempSrc:      SpO2: 95% 97% 98% 96%  Weight:      Height:       General: Well developed, well nourished, NAD Lungs:Clear to ausculation bilaterally. No wheezes, rales, or rhonchi. Breathing is unlabored. Cardiovascular: RRR with S1 S2. No murmurs Extremities: No edema. Groin stable.  Neuro: Alert and oriented. No focal deficits. No facial asymmetry. MAE spontaneously. Psych: Responds to questions appropriately with normal affect.    Labs:   Lab Results  Component Value Date   WBC 9.6 03/22/2022   HGB 12.1 03/22/2022   HCT 36.8 03/22/2022   MCV 91 03/22/2022   PLT 340 03/22/2022   No results for input(s): "NA", "K", "CL", "CO2", "BUN", "CREATININE", "CALCIUM", "PROT", "BILITOT", "ALKPHOS", "ALT", "AST", "GLUCOSE" in the last 168 hours.  Invalid input(s): "LABALBU"   Discharge Medications:  Allergies as of 04/20/2022       Reactions   Amitriptyline Hcl Swelling   Facial swelling   Duricef [cefadroxil] Swelling   Facial swelling   Levofloxacin Other (See Comments)   Severe Shoulder Pain   Tetanus Toxoid Swelling   Arm swelling   Tramadol Itching   Erythromycin Rash, Other (See Comments)   Rash on bottom        Medication List  TAKE these medications    amLODipine 2.5 MG tablet Commonly known as: NORVASC Take 2.5-5 mg by mouth See admin instructions. Take 2.5 mg in the morning and 5 mg at bedtime   amoxicillin 500 MG capsule Commonly known as: AMOXIL Take 4 tablets by mouth 1 hour prior to dental procedures.   apixaban 5 MG Tabs tablet Commonly known as: ELIQUIS Take 1 tablet (5 mg total) by mouth 2 (two) times daily. Notes to patient: Restart Eliquis TONIGHT 04/20/22    carvedilol 3.125 MG tablet Commonly known as: COREG Take 3.125 mg by mouth 2 (two) times daily with a meal.   HYDROcodone-acetaminophen 5-325 MG tablet Commonly known as: NORCO/VICODIN Take 2 tablets by mouth 4 (four) times daily.   pravastatin 40 MG tablet Commonly known as: PRAVACHOL Take 40 mg by mouth at bedtime.   telmisartan 80 MG tablet Commonly known as: MICARDIS Take 80 mg by mouth daily.   tiZANidine 4 MG tablet Commonly known as: ZANAFLEX Take 1 tablet (4 mg total) by mouth 2 (two) times a week. What changed: when to take this        Disposition:  Home  Discharge Instructions     Diet - low sodium heart healthy   Complete by: As directed    Discharge instructions   Complete by: As directed    [facility] Procedure, Care After  Procedure MD: Dr. Benson [place] Clinical Coordinator: Lenice Llamas, RN  This sheet gives you information about how to care for yourself after your procedure. Your health care provider may also give you more specific instructions. If you have problems or questions, contact your health care provider.  What can I expect after the procedure? After the procedure, it is common to have: Bruising around your puncture site. Tenderness around your puncture site. Tiredness (fatigue).  Medication instructions It is very important to continue to take your blood thinner as directed by your doctor after the Watchman procedure. Call your procedure doctor's office with question or concerns. If you are on Coumadin (warfarin), you will have your INR checked the week after your procedure, with a goal INR of 2.0 - 3.0. Please follow your medication instructions on your discharge summary. Only take the medications listed on your discharge paperwork.  Follow up You will be seen in 1 month after your procedure You will have a repeat CT scan approximately 8 weeks after your procedure mark to check your device You will follow up the MD/APP who performed  your procedure 6 months after your procedure The Watchman Clinical Coordinator will check in with you from time to time, including 1 and 2 years after your procedure.    Follow these instructions at home: Puncture site care  Follow instructions from your health care provider about how to take care of your puncture site. Make sure you: If present, leave stitches (sutures), skin glue, or adhesive strips in place.  If a large square bandage is present, this may be removed 24 hours after surgery.  Check your puncture site every day for signs of infection. Check for: Redness, swelling, or pain. Fluid or blood. If your puncture site starts to bleed, lie down on your back, apply firm pressure to the area, and contact your health care provider. Warmth. Pus or a bad smell. Driving Do not drive yourself home if you received sedation Do not drive for at least 4 days after your procedure or however long your health care provider recommends. (Do not resume driving if you have previously  been instructed not to drive for other health reasons.) Do not spend greater than 1 hour at a time in a car for the first 3 days. Stop and take a break with a 5 minute walk at least every hour.  Do not drive or use heavy machinery while taking prescription pain medicine.  Activity Avoid activities that take a lot of effort, including exercise, for at least 7 days after your procedure. For the first 3 days, avoid sitting for longer than one hour at a time.  Avoid alcoholic beverages, signing paperwork, or participating in legal proceedings for 24 hours after receiving sedation Do not lift anything that is heavier than 10 lb (4.5 kg) for one week.  No sexual activity for 1 week.  Return to your normal activities as told by your health care provider. Ask your health care provider what activities are safe for you. General instructions Take over-the-counter and prescription medicines only as told by your health care  provider. Do not use any products that contain nicotine or tobacco, such as cigarettes and e-cigarettes. If you need help quitting, ask your health care provider. You may shower after 24 hours, but Do not take baths, swim, or use a hot tub for 1 week.  Do not drink alcohol for 24 hours after your procedure. Keep all follow-up visits as told by your health care provider. This is important. Dental Work: You will require antibiotics prior to any dental work, including cleanings, for 6 months after your Watchman implantation to help protect you from infection. After 6 months, antibiotics are no longer required. Contact a health care provider if: You have redness, mild swelling, or pain around your puncture site. You have soreness in your throat or at your puncture site that does not improve after several days You have fluid or blood coming from your puncture site that stops after applying firm pressure to the area. Your puncture site feels warm to the touch. You have pus or a bad smell coming from your puncture site. You have a fever. You have chest pain or discomfort that spreads to your neck, jaw, or arm. You are sweating a lot. You feel nauseous. You have a fast or irregular heartbeat. You have shortness of breath. You are dizzy or light-headed and feel the need to lie down. You have pain or numbness in the arm or leg closest to your puncture site. Get help right away if: Your puncture site suddenly swells. Your puncture site is bleeding and the bleeding does not stop after applying firm pressure to the area. These symptoms may represent a serious problem that is an emergency. Do not wait to see if the symptoms will go away. Get medical help right away. Call your local emergency services (911 in the [place] Do not drive yourself to the hospital. Summary After the procedure, it is normal to have bruising and tenderness at the puncture site in your groin, neck, or forearm. Check your puncture  site every day for signs of infection. Get help right away if your puncture site is bleeding and the bleeding does not stop after applying firm pressure to the area. This is a medical emergency.  This information is not intended to replace advice given to you by your health care provider. Make sure you discuss any questions you have with your health care provider.   Increase activity slowly   Complete by: As directed        Follow-up Information     Tommie Raymond,  NP Follow up on 05/22/2022.   Specialty: Cardiology Why: @ 0930am. Please arrive at 0915am. Contact information: [address] [place] [address] [phone]                 Duration of Discharge Encounter: Greater than 30 minutes including physician time.  Signed, Kathyrn Drown, NP  04/20/2022 2:29 PM

## 2022-04-20 NOTE — H&P (Signed)
Electrophysiology Office Note:     Date:  04/20/2022    ID:  Molly Mitchell, Molly Mitchell [birth year], MRN [id number]   PCP:  [place] Cardiologist:  None  [facility] Electrophysiologist:  Molly Epley, MD    Referring MD: Gladstone Lighter, MD    Chief Complaint: Atrial fibrillation   History of Present Illness:     Molly Mitchell is a 77 y.o. female who presents for an evaluation of atrial fibrillation at the request of Molly Mitchell. Their medical history includes MCA stroke, carotid artery disease, chronic kidney disease, coronary artery disease, hypertension, hyperlipidemia and atrial fibrillation.  The patient was seen by Molly Mitchell November 04, 2021.   The patient was hospitalized for right sided carotid endarterectomy on September 21, 2021 and had an episode of postop A-fib that converted with IV diltiazem.  There were no prolonged episodes of atrial fibrillation on a postdischarge heart monitor.  At the appointment with Molly Mitchell, Eliquis was started given her significantly elevated CHA2DS2-VASc, multiple TIAs, stroke and short episodes of atrial fibrillation on heart monitoring.   She is very interested in coming off anticoagulation long-term given her chronic use of NSAIDs for her chronic back pain.  She is unable to afford NOACs and is currently taking Coumadin.  She is also experienced GI bleeding in the past which makes her concerned about long-term anticoagulation use.   Presents for LAAO today. Procedure reviewed.   Objective      Past Medical History:  Diagnosis Date   Acute right MCA stroke ([place]) 08/01/2021    a.) noted on MRI head 08/01/2021 --> punctate acute RIGHT MCA territory infarcts affecting the frontal and parietal contex consistent with microembolic infarcts in the RIGHT carotid circulation   Anemia     Arthritis     Bilateral carotid artery disease ([place]) 08/18/2021    a.) CTA neck 08/18/2021: [id number] RICA and [id number] LICA    Cervicalgia     Chronic kidney disease     Chronic pain syndrome     Chronic, continuous use of opioids      a.) hydrocodone/APAP (Norco 10/325 mg)   Connective tissue disease ([place])      pt reports being diagnosed with undifferentiated connective tissue disease at some point- pt took steroids for this and has had no issues since- 10 years ago   Coronary artery disease     Cortical senile cataract     DDD (degenerative disc disease), cervical      a.) s/p C4-C6 ACDF   DDD (degenerative disc disease), lumbar      a.) s/p laminectomy + posterior L2-S1 fusion   History of 2019 novel coronavirus disease (COVID-19) 12/07/2018   History of blood transfusion     History of heart murmur in childhood     History of rheumatic fever     HLD (hyperlipidemia)     Hypertension     Lower extremity weakness      a.) with position changes from sitting to standing   Osteoporosis     Pseudogout     Shingles     Skin cancer of face     Spondylolisthesis of lumbar region             Past Surgical History:  Procedure Laterality Date   ABDOMINAL HYSTERECTOMY   1981   AIKEN OSTEOTOMY Right 01/30/2018    Procedure: WEIL RIGHT 2ND AND 3RD;  Surgeon: [name], DPM;  [facility];  Service: Podiatry;  Laterality: Right;   ANTERIOR CERVICAL DECOMP/DISCECTOMY FUSION N/A 2003    C4-C6   BACK SURGERY        pt reports hx of 3 lumber back surgeries total   CERVICAL SPINE SURGERY        pt reports having 3 cervical fusions total   ENDARTERECTOMY Right 09/21/2021    Procedure: ENDARTERECTOMY CAROTID;  Surgeon: Katha Cabal, MD;  Location: [facility];  Service: Vascular;  Laterality: Right;  Right carotid   HAMMER TOE SURGERY Right 01/30/2018    Procedure: HAMMER TOE CORRECTION SECOND AND THIRD;  Surgeon: [name], DPM;  Location: [place];  Service: Podiatry;  Laterality: Right;  GENERAL WITH LOCAL   HAMMER TOE SURGERY Left 03/05/2019    Procedure: HAMMER TOE  CORRECTION T1 AND T2;  Surgeon: [name], DPM;  Location: [place];  Service: Podiatry;  Laterality: Left;   POSTERIOR LUMBAR FUSION N/A 2001    L2-S1   ROTATOR CUFF REPAIR Right 1981   SACROPLASTY N/A 03/29/2017    S1   TOTAL KNEE ARTHROPLASTY Left 03/03/2013   TOTAL KNEE REVISION Left 07/31/2016    Procedure: TOTAL KNEE REVISION;  Surgeon: Dereck Leep, MD;  Location: [facility];  Service: Orthopedics;  Laterality: Left;   WEIL OSTEOTOMY Left 03/05/2019    Procedure: WEIL OSTEOTOMY X 2 LEFT;  Surgeon: [name], DPM;  Location: [place];  Service: Podiatry;  Laterality: Left;      Current Medications: Active Medications      Current Meds  Medication Sig   amLODipine (NORVASC) 2.5 MG tablet Take 2.5 mg by mouth in the morning and at bedtime.   carvedilol (COREG) 3.125 MG tablet Take 3.125 mg by mouth 2 (two) times daily with a meal.   HYDROcodone-acetaminophen (NORCO) 10-325 MG tablet Take 1 tablet by mouth every 6 (six) hours as needed.   pravastatin (PRAVACHOL) 40 MG tablet Take 40 mg by mouth at bedtime.   telmisartan (MICARDIS) 80 MG tablet Take 80 mg by mouth daily.   tiZANidine (ZANAFLEX) 4 MG tablet Take 4 mg by mouth 2 (two) times daily.   warfarin (COUMADIN) 2.5 MG tablet Take by mouth.        Allergies:   Amitriptyline hcl, Duricef [cefadroxil], Levofloxacin, Tetanus toxoid, Tramadol, and Erythromycin    Social History         Socioeconomic History   Marital status: Married      Spouse name: Eddie Dibbles   Number of children: 2   Years of education: Not on file   Highest education level: High school graduate  Occupational History   Occupation: retired  Tobacco Use   Smoking status: Former      Types: Cigarettes      Quit date: 04/17/1979      Years since quitting: 42.7   Smokeless tobacco: Never  Vaping Use   Vaping Use: Never used  Substance and Sexual Activity   Alcohol use: No      Alcohol/week: 0.0 standard drinks of alcohol    Drug use: No   Sexual activity: Not on file  Other Topics Concern   Not on file  Social History Narrative   Not on file    Social Determinants of Health        Financial Resource Strain: Low Risk  (03/23/2020)    Overall Financial Resource Strain (CARDIA)     Difficulty of Paying Living Expenses: Not hard at all  Food Insecurity: No Food Insecurity (09/21/2021)    Hunger Vital Sign     Worried About Running Out of Food in the Last Year: Never true     Ran Out of Food in the Last Year: Never true  Transportation Needs: No Transportation Needs (09/21/2021)    PRAPARE - Armed forces logistics/support/administrative officer (Medical): No     Lack of Transportation (Non-Medical): No  Physical Activity: Sufficiently Active (03/23/2020)    Exercise Vital Sign     Days of Exercise per Week: 3 days     Minutes of Exercise per Session: 60 min  Stress: No Stress Concern Present (03/23/2020)    [place]     Feeling of Stress : Not at all  Social Connections: [place] (03/23/2020)    Social Connection and Isolation Panel [NHANES]     Frequency of Communication with Friends and Family: Never     Frequency of Social Gatherings with Friends and Family: More than three times a week     Attends Religious Services: More than 4 times per year     Active Member of [employer] or Organizations: Yes     Attends Music therapist: More than 4 times per year     Marital Status: Married      Family History: The patient's family history includes Cancer in her sister; Diabetes in her paternal grandmother; Heart failure in her maternal grandmother; Hypertension in her brother, mother, and sister.   ROS:   Please see the history of present illness.    All other systems reviewed and are negative.   EKGs/Labs/Other Studies Reviewed:     The following studies were reviewed today:   EKG from September 22, 2021 shows atrial fibrillation with  rapid ventricular rate     Recent Labs: 09/22/2021: BUN 19; Creatinine, Ser 0.94; Magnesium 2.4; Potassium 5.0; Sodium 136; TSH 0.410 09/23/2021: Hemoglobin 9.6; Platelets 224  Recent Lipid Panel Labs (Brief)          Component Value Date/Time    CHOL 220 (H) 05/31/2020 1007    TRIG 100 05/31/2020 1007    HDL 76 05/31/2020 1007    CHOLHDL 2.9 05/31/2020 1007    CHOLHDL 3.1 09/26/2016 1108    LDLCALC 127 (H) 05/31/2020 1007    LDLCALC 113 (H) 09/26/2016 1108        Physical Exam:     VS:  BP (!) 158/56   Pulse 76   Ht 5\' 2"  (1.575 m)   Wt 155 lb 8 oz (70.5 kg)   SpO2 98%   BMI 28.44 kg/m         Wt Readings from Last 3 Encounters:  01/04/22 155 lb 8 oz (70.5 kg)  10/20/21 150 lb 12.8 oz (68.4 kg)  09/21/21 145 lb (65.8 kg)      GEN:  Well nourished, well developed in no acute distress HEENT: Normal NECK: No JVD; No carotid bruits LYMPHATICS: No lymphadenopathy CARDIAC: RRR, no murmurs, rubs, gallops RESPIRATORY:  Clear to auscultation without rales, wheezing or rhonchi  ABDOMEN: Soft, non-tender, non-distended MUSCULOSKELETAL:  No edema; No deformity  SKIN: Warm and dry NEUROLOGIC:  Alert and oriented x 3 PSYCHIATRIC:  Normal affect          Assessment ASSESSMENT:     1. Atrial fibrillation, unspecified type ([place])   2. Cerebrovascular accident (CVA), unspecified mechanism ([place])     PLAN:  In order of problems listed above:   #Atrial Fibrillation Low burden but with significantly elevated chadsvasc. She is very interested in avoiding long-term anticoagulation use given her chronic NSAID use and history of GI bleeding.  I think is very reasonable for her to pursue left atrial appendage occlusion.  I discussed the procedure in detail include the risks and she wishes to proceed with scheduling.   CHA2DS2-VASc Score = 7 (11.2% annual estimated stroke risk) The patient's score is based upon: CHF History: 0 HTN History: 1 Diabetes History: 0 Stroke  History: 2 Vascular Disease History: 1 Age Score: 2 Gender Score: 1   --------------------------------   I have seen Feliciana Rossetti in the office today who is being considered for a Watchman left atrial appendage closure device. I believe they will benefit from this procedure given their history of atrial fibrillation, CHA2DS2-VASc score of 7 and unadjusted ischemic stroke rate of 11.2% per year. Unfortunately, the patient is not felt to be a long term anticoagulation candidate secondary to history of GI bleeding and chronic NSAID use. The patient's chart has been reviewed and I feel that they would be a candidate for short term oral anticoagulation after Watchman implant.    It is my belief that after undergoing a LAA closure procedure, KANEESHA BEDDOE will not need long term anticoagulation which eliminates anticoagulation side effects and major bleeding risk.    Procedural risks for the Watchman implant have been reviewed with the patient including a 0.5% risk of stroke, <1% risk of perforation and <1% risk of device embolization. Other risks include bleeding, vascular damage, tamponade, worsening renal function, and death. The patient understands these risk and wishes to proceed.       The published clinical data on the safety and effectiveness of WATCHMAN include but are not limited to the following: - Holmes DR, Mechele Claude, Sick P et al. for the PROTECT AF Investigators. Percutaneous closure of the left atrial appendage versus warfarin therapy for prevention of stroke in patients with atrial fibrillation: a randomised non-inferiority trial. Lancet 2009; 374: 534-42. Mechele Claude, Doshi SK, Abelardo Diesel D et al. on behalf of the PROTECT AF Investigators. Percutaneous Left Atrial Appendage Closure for Stroke Prophylaxis in Patients With Atrial Fibrillation 2.3-Year Follow-up of the PROTECT AF (Watchman Left Atrial Appendage System for Embolic Protection in Patients With Atrial Fibrillation)  Trial. Circulation 2013; 127:720-729. - Alli O, Doshi S,  Kar S, Reddy VY, Sievert H et al. Quality of Life Assessment in the Randomized PROTECT AF (Percutaneous Closure of the Left Atrial Appendage Versus Warfarin Therapy for Prevention of Stroke in Patients With Atrial Fibrillation) Trial of Patients at Risk for Stroke With Nonvalvular Atrial Fibrillation. J Am Coll Cardiol 2013; P4788364. Vertell Limber DR, Tarri Abernethy, Price M, [place], Sievert H, Doshi S, Huber K, Reddy V. Prospective randomized evaluation of the Watchman left atrial appendage Device in patients with atrial fibrillation versus long-term warfarin therapy; the PREVAIL trial. Journal of the [employer] of Cardiology, Vol. 4, No. 1, 2014, 1-11. - Kar S, Doshi SK, Sadhu A, Horton R, Osorio J et al. Primary outcome evaluation of a next-generation left atrial appendage closure device: results from the PINNACLE FLX trial. Circulation 2021;143(18)1754-1762.      After today's visit with the patient which was dedicated solely for shared decision making visit regarding LAA closure device, the patient decided to proceed with the LAA appendage closure procedure scheduled to be done in the near future at [facility]. Prior to the procedure, I would like to obtain a gated CT scan of the chest with contrast timed for PV/LA visualization.        HAS-BLED score 5 Hypertension Yes  Abnormal renal and liver function (Dialysis, transplant, Cr >2.26 mg/dL /Cirrhosis or Bilirubin >2x Normal or AST/ALT/AP >3x Normal) No  Stroke Yes  Bleeding Yes  Labile INR (Unstable/high INR) No  Elderly (>65) Yes  Drugs or alcohol (? 8 drinks/week, anti-plt or NSAID) Yes    CHA2DS2-VASc Score = 7  The patient's score is based upon: CHF History: 0 HTN History: 1 Diabetes History: 0 Stroke History: 2 Vascular Disease History: 1 Age Score: 2 Gender Score: 1    Presents for LAAO today. Procedure reviewed.     Signed, Hilton Cork. Quentin Ore, MD, [facility],  [facility] 04/20/2022 Electrophysiology [facility]

## 2022-04-20 NOTE — Anesthesia Preprocedure Evaluation (Addendum)
Anesthesia Evaluation  Patient identified by MRN, date of birth, ID band Patient awake    Reviewed: Allergy & Precautions, NPO status , Patient's Chart, lab work & pertinent test results, reviewed documented beta blocker date and time   Airway Mallampati: II  TM Distance: >3 FB Neck ROM: Limited    Dental  (+) Dental Advisory Given, Caps, Missing   Pulmonary asthma , former smoker   Pulmonary exam normal breath sounds clear to auscultation       Cardiovascular hypertension, Pt. on medications and Pt. on home beta blockers + CAD and + Peripheral Vascular Disease  + dysrhythmias Atrial Fibrillation  Rhythm:Regular Rate:Normal  09/2021 ECHO: normal LVF, normal RVF, mild TR 10/2021 ETT: normal stress test   Neuro/Psych S/p ACDF  Neuromuscular disease CVA, No Residual Symptoms    GI/Hepatic negative GI ROS, Neg liver ROS,,,  Endo/Other  negative endocrine ROS    Renal/GU Renal InsufficiencyRenal disease     Musculoskeletal  (+) Arthritis ,    Abdominal   Peds  Hematology  (+) Blood dyscrasia (Eliquis)   Anesthesia Other Findings Day of surgery medications reviewed with the patient.  Reproductive/Obstetrics                             Anesthesia Physical Anesthesia Plan  ASA: 3  Anesthesia Plan: General   Post-op Pain Management: Tylenol PO (pre-op)*   Induction: Intravenous  PONV Risk Score and Plan: 3 and Dexamethasone and Ondansetron  Airway Management Planned: Oral ETT and Video Laryngoscope Planned  Additional Equipment: ClearSight  Intra-op Plan:   Post-operative Plan: Extubation in OR  Informed Consent: I have reviewed the patients History and Physical, chart, labs and discussed the procedure including the risks, benefits and alternatives for the proposed anesthesia with the patient or authorized representative who has indicated his/her understanding and acceptance.      Dental advisory given  Plan Discussed with: CRNA  Anesthesia Plan Comments: (2nd PIV)        Anesthesia Quick Evaluation

## 2022-04-20 NOTE — Transfer of Care (Signed)
Immediate Anesthesia Transfer of Care Note  Patient: GAILA SCALES  Procedure(s) Performed: LEFT ATRIAL APPENDAGE OCCLUSION TRANSESOPHAGEAL ECHOCARDIOGRAM  Patient Location: PACU  Anesthesia Type:General  Level of Consciousness: awake, alert , and oriented  Airway & Oxygen Therapy: Patient Spontanous Breathing and Patient connected to nasal cannula oxygen  Post-op Assessment: Report given to RN and Post -op Vital signs reviewed and stable  Post vital signs: Reviewed and stable  Last Vitals:  Vitals Value Taken Time  BP 116/98 04/20/22 1045  Temp 36.6 C 04/20/22 1043  Pulse 64 04/20/22 1047  Resp 19 04/20/22 1047  SpO2 98 % 04/20/22 1047  Vitals shown include unvalidated device data.  Last Pain:  Vitals:   04/20/22 1043  TempSrc: Temporal  PainSc: 0-No pain         Complications: There were no known notable events for this encounter.

## 2022-04-20 NOTE — Progress Notes (Signed)
Admission from the cath.lab by bed awake and alert. Right groin dressing  dry and intact. Instructed to avoid bending   the affected leg.  Bed rest emphasized for 4 hours till 4 pm. Continue to monitor.

## 2022-04-20 NOTE — Anesthesia Procedure Notes (Addendum)
Procedure Name: Intubation Date/Time: 04/20/2022 9:30 AM  Performed by: Gwyndolyn Saxon, CRNAPre-anesthesia Checklist: Patient identified, Emergency Drugs available, Suction available and Patient being monitored Patient Re-evaluated:Patient Re-evaluated prior to induction Oxygen Delivery Method: Circle system utilized Preoxygenation: Pre-oxygenation with 100% oxygen Induction Type: IV induction Ventilation: Mask ventilation without difficulty Laryngoscope Size: Mac, 4 and Glidescope Grade View: Grade I Tube type: Oral Tube size: 7.0 mm Number of attempts: 1 Airway Equipment and Method: Stylet Placement Confirmation: ETT inserted through vocal cords under direct vision, positive ETCO2 and breath sounds checked- equal and bilateral Secured at: 21 cm Tube secured with: Tape Dental Injury: Teeth and Oropharynx as per pre-operative assessment

## 2022-04-21 ENCOUNTER — Ambulatory Visit (INDEPENDENT_AMBULATORY_CARE_PROVIDER_SITE_OTHER): Payer: Medicare HMO | Admitting: Nurse Practitioner

## 2022-04-21 ENCOUNTER — Encounter (HOSPITAL_COMMUNITY): Payer: Self-pay | Admitting: Cardiology

## 2022-04-21 ENCOUNTER — Encounter (INDEPENDENT_AMBULATORY_CARE_PROVIDER_SITE_OTHER): Payer: Medicare HMO

## 2022-04-21 NOTE — Anesthesia Postprocedure Evaluation (Signed)
Anesthesia Post Note  Patient: Molly Mitchell  Procedure(s) Performed: LEFT ATRIAL APPENDAGE OCCLUSION TRANSESOPHAGEAL ECHOCARDIOGRAM     Patient location during evaluation: Cath Lab Anesthesia Type: General Level of consciousness: awake and alert Pain management: pain level controlled Vital Signs Assessment: post-procedure vital signs reviewed and stable Respiratory status: spontaneous breathing, nonlabored ventilation, respiratory function stable and patient connected to nasal cannula oxygen Cardiovascular status: blood pressure returned to baseline and stable Postop Assessment: no apparent nausea or vomiting Anesthetic complications: no   There were no known notable events for this encounter.  Last Vitals:  Vitals:   04/20/22 1400 04/20/22 1500  BP: (!) 105/48 (!) 128/53  Pulse: 62 63  Resp: 11 17  Temp:    SpO2: 96% 98%    Last Pain:  Vitals:   04/20/22 1530  TempSrc:   PainSc: 0-No pain                 Collene Schlichter

## 2022-04-25 ENCOUNTER — Encounter: Payer: Self-pay | Admitting: Cardiology

## 2022-05-03 ENCOUNTER — Other Ambulatory Visit: Payer: Self-pay

## 2022-05-03 ENCOUNTER — Encounter: Payer: Self-pay | Admitting: Emergency Medicine

## 2022-05-03 ENCOUNTER — Emergency Department: Payer: Medicare HMO

## 2022-05-03 ENCOUNTER — Inpatient Hospital Stay
Admission: EM | Admit: 2022-05-03 | Discharge: 2022-05-17 | DRG: 291 | Disposition: E | Payer: Medicare HMO | Attending: Student in an Organized Health Care Education/Training Program | Admitting: Student in an Organized Health Care Education/Training Program

## 2022-05-03 DIAGNOSIS — Z8616 Personal history of COVID-19: Secondary | ICD-10-CM | POA: Diagnosis not present

## 2022-05-03 DIAGNOSIS — I10 Essential (primary) hypertension: Secondary | ICD-10-CM | POA: Diagnosis present

## 2022-05-03 DIAGNOSIS — I5033 Acute on chronic diastolic (congestive) heart failure: Secondary | ICD-10-CM | POA: Diagnosis present

## 2022-05-03 DIAGNOSIS — N1831 Chronic kidney disease, stage 3a: Secondary | ICD-10-CM | POA: Diagnosis present

## 2022-05-03 DIAGNOSIS — E785 Hyperlipidemia, unspecified: Secondary | ICD-10-CM | POA: Diagnosis present

## 2022-05-03 DIAGNOSIS — R110 Nausea: Secondary | ICD-10-CM | POA: Diagnosis not present

## 2022-05-03 DIAGNOSIS — N179 Acute kidney failure, unspecified: Secondary | ICD-10-CM | POA: Diagnosis present

## 2022-05-03 DIAGNOSIS — I6523 Occlusion and stenosis of bilateral carotid arteries: Secondary | ICD-10-CM | POA: Diagnosis present

## 2022-05-03 DIAGNOSIS — Z7901 Long term (current) use of anticoagulants: Secondary | ICD-10-CM

## 2022-05-03 DIAGNOSIS — Z887 Allergy status to serum and vaccine status: Secondary | ICD-10-CM

## 2022-05-03 DIAGNOSIS — Z79899 Other long term (current) drug therapy: Secondary | ICD-10-CM

## 2022-05-03 DIAGNOSIS — Z515 Encounter for palliative care: Secondary | ICD-10-CM | POA: Diagnosis not present

## 2022-05-03 DIAGNOSIS — Z96652 Presence of left artificial knee joint: Secondary | ICD-10-CM | POA: Diagnosis present

## 2022-05-03 DIAGNOSIS — Z66 Do not resuscitate: Secondary | ICD-10-CM | POA: Diagnosis not present

## 2022-05-03 DIAGNOSIS — D649 Anemia, unspecified: Secondary | ICD-10-CM | POA: Diagnosis not present

## 2022-05-03 DIAGNOSIS — E875 Hyperkalemia: Secondary | ICD-10-CM | POA: Diagnosis present

## 2022-05-03 DIAGNOSIS — K219 Gastro-esophageal reflux disease without esophagitis: Secondary | ICD-10-CM | POA: Diagnosis present

## 2022-05-03 DIAGNOSIS — M549 Dorsalgia, unspecified: Secondary | ICD-10-CM | POA: Diagnosis present

## 2022-05-03 DIAGNOSIS — J4520 Mild intermittent asthma, uncomplicated: Secondary | ICD-10-CM | POA: Diagnosis not present

## 2022-05-03 DIAGNOSIS — J189 Pneumonia, unspecified organism: Secondary | ICD-10-CM | POA: Diagnosis present

## 2022-05-03 DIAGNOSIS — I4891 Unspecified atrial fibrillation: Secondary | ICD-10-CM

## 2022-05-03 DIAGNOSIS — J918 Pleural effusion in other conditions classified elsewhere: Secondary | ICD-10-CM | POA: Diagnosis present

## 2022-05-03 DIAGNOSIS — J45909 Unspecified asthma, uncomplicated: Secondary | ICD-10-CM | POA: Diagnosis present

## 2022-05-03 DIAGNOSIS — Z1152 Encounter for screening for COVID-19: Secondary | ICD-10-CM | POA: Diagnosis not present

## 2022-05-03 DIAGNOSIS — M810 Age-related osteoporosis without current pathological fracture: Secondary | ICD-10-CM | POA: Diagnosis present

## 2022-05-03 DIAGNOSIS — I2510 Atherosclerotic heart disease of native coronary artery without angina pectoris: Secondary | ICD-10-CM | POA: Diagnosis present

## 2022-05-03 DIAGNOSIS — R079 Chest pain, unspecified: Principal | ICD-10-CM

## 2022-05-03 DIAGNOSIS — I130 Hypertensive heart and chronic kidney disease with heart failure and stage 1 through stage 4 chronic kidney disease, or unspecified chronic kidney disease: Secondary | ICD-10-CM | POA: Diagnosis present

## 2022-05-03 DIAGNOSIS — D631 Anemia in chronic kidney disease: Secondary | ICD-10-CM | POA: Diagnosis present

## 2022-05-03 DIAGNOSIS — Z881 Allergy status to other antibiotic agents status: Secondary | ICD-10-CM

## 2022-05-03 DIAGNOSIS — G894 Chronic pain syndrome: Secondary | ICD-10-CM | POA: Diagnosis present

## 2022-05-03 DIAGNOSIS — Z8249 Family history of ischemic heart disease and other diseases of the circulatory system: Secondary | ICD-10-CM

## 2022-05-03 DIAGNOSIS — Z888 Allergy status to other drugs, medicaments and biological substances status: Secondary | ICD-10-CM

## 2022-05-03 DIAGNOSIS — Z9889 Other specified postprocedural states: Secondary | ICD-10-CM

## 2022-05-03 DIAGNOSIS — I959 Hypotension, unspecified: Secondary | ICD-10-CM | POA: Diagnosis not present

## 2022-05-03 DIAGNOSIS — Z981 Arthrodesis status: Secondary | ICD-10-CM

## 2022-05-03 DIAGNOSIS — I482 Chronic atrial fibrillation, unspecified: Secondary | ICD-10-CM | POA: Diagnosis present

## 2022-05-03 DIAGNOSIS — J9601 Acute respiratory failure with hypoxia: Secondary | ICD-10-CM | POA: Diagnosis not present

## 2022-05-03 DIAGNOSIS — Z8673 Personal history of transient ischemic attack (TIA), and cerebral infarction without residual deficits: Secondary | ICD-10-CM

## 2022-05-03 DIAGNOSIS — R001 Bradycardia, unspecified: Secondary | ICD-10-CM | POA: Diagnosis not present

## 2022-05-03 DIAGNOSIS — G4700 Insomnia, unspecified: Secondary | ICD-10-CM | POA: Diagnosis present

## 2022-05-03 DIAGNOSIS — Z7401 Bed confinement status: Secondary | ICD-10-CM

## 2022-05-03 DIAGNOSIS — F411 Generalized anxiety disorder: Secondary | ICD-10-CM | POA: Diagnosis present

## 2022-05-03 DIAGNOSIS — G8929 Other chronic pain: Secondary | ICD-10-CM | POA: Diagnosis present

## 2022-05-03 DIAGNOSIS — A419 Sepsis, unspecified organism: Secondary | ICD-10-CM | POA: Diagnosis not present

## 2022-05-03 DIAGNOSIS — Z95818 Presence of other cardiac implants and grafts: Secondary | ICD-10-CM

## 2022-05-03 DIAGNOSIS — Z833 Family history of diabetes mellitus: Secondary | ICD-10-CM

## 2022-05-03 DIAGNOSIS — Z87891 Personal history of nicotine dependence: Secondary | ICD-10-CM

## 2022-05-03 DIAGNOSIS — I5032 Chronic diastolic (congestive) heart failure: Secondary | ICD-10-CM | POA: Diagnosis present

## 2022-05-03 DIAGNOSIS — Z85828 Personal history of other malignant neoplasm of skin: Secondary | ICD-10-CM

## 2022-05-03 LAB — COMPREHENSIVE METABOLIC PANEL
ALT: 55 U/L — ABNORMAL HIGH (ref 0–44)
AST: 58 U/L — ABNORMAL HIGH (ref 15–41)
Albumin: 3.4 g/dL — ABNORMAL LOW (ref 3.5–5.0)
Alkaline Phosphatase: 312 U/L — ABNORMAL HIGH (ref 38–126)
Anion gap: 8 (ref 5–15)
BUN: 42 mg/dL — ABNORMAL HIGH (ref 8–23)
CO2: 24 mmol/L (ref 22–32)
Calcium: 9 mg/dL (ref 8.9–10.3)
Chloride: 102 mmol/L (ref 98–111)
Creatinine, Ser: 1.23 mg/dL — ABNORMAL HIGH (ref 0.44–1.00)
GFR, Estimated: 46 mL/min — ABNORMAL LOW (ref >60)
Glucose, Bld: 140 mg/dL — ABNORMAL HIGH (ref 70–99)
Potassium: 4.9 mmol/L (ref 3.5–5.1)
Sodium: 134 mmol/L — ABNORMAL LOW (ref 135–145)
Total Bilirubin: 0.6 mg/dL (ref 0.3–1.2)
Total Protein: 7.6 g/dL (ref 6.5–8.1)

## 2022-05-03 LAB — URINALYSIS, ROUTINE W REFLEX MICROSCOPIC
Bacteria, UA: NONE SEEN
Bilirubin Urine: NEGATIVE
Glucose, UA: NEGATIVE mg/dL
Hgb urine dipstick: NEGATIVE
Ketones, ur: NEGATIVE mg/dL
Nitrite: NEGATIVE
Protein, ur: 30 mg/dL — AB
Specific Gravity, Urine: 1.018 (ref 1.005–1.030)
pH: 5 (ref 5.0–8.0)

## 2022-05-03 LAB — STREP PNEUMONIAE URINARY ANTIGEN: Strep Pneumo Urinary Antigen: NEGATIVE

## 2022-05-03 LAB — CBC WITH DIFFERENTIAL/PLATELET
Abs Immature Granulocytes: 0.07 10*3/uL (ref 0.00–0.07)
Basophils Absolute: 0.1 10*3/uL (ref 0.0–0.1)
Basophils Relative: 0 %
Eosinophils Absolute: 0.2 10*3/uL (ref 0.0–0.5)
Eosinophils Relative: 1 %
HCT: 32.7 % — ABNORMAL LOW (ref 36.0–46.0)
Hemoglobin: 10.5 g/dL — ABNORMAL LOW (ref 12.0–15.0)
Immature Granulocytes: 1 %
Lymphocytes Relative: 16 %
Lymphs Abs: 2.1 10*3/uL (ref 0.7–4.0)
MCH: 29.2 pg (ref 26.0–34.0)
MCHC: 32.1 g/dL (ref 30.0–36.0)
MCV: 90.8 fL (ref 80.0–100.0)
Monocytes Absolute: 1.3 10*3/uL — ABNORMAL HIGH (ref 0.1–1.0)
Monocytes Relative: 10 %
Neutro Abs: 9.9 10*3/uL — ABNORMAL HIGH (ref 1.7–7.7)
Neutrophils Relative %: 72 %
Platelets: 455 10*3/uL — ABNORMAL HIGH (ref 150–400)
RBC: 3.6 MIL/uL — ABNORMAL LOW (ref 3.87–5.11)
RDW: 15 % (ref 11.5–15.5)
WBC: 13.6 10*3/uL — ABNORMAL HIGH (ref 4.0–10.5)
nRBC: 0 % (ref 0.0–0.2)

## 2022-05-03 LAB — TSH: TSH: 1.501 u[IU]/mL (ref 0.350–4.500)

## 2022-05-03 LAB — BRAIN NATRIURETIC PEPTIDE: B Natriuretic Peptide: 716.4 pg/mL — ABNORMAL HIGH (ref 0.0–100.0)

## 2022-05-03 LAB — PROCALCITONIN: Procalcitonin: 0.19 ng/mL

## 2022-05-03 LAB — RESP PANEL BY RT-PCR (RSV, FLU A&B, COVID)  RVPGX2
Influenza A by PCR: NEGATIVE
Influenza B by PCR: NEGATIVE
Resp Syncytial Virus by PCR: NEGATIVE
SARS Coronavirus 2 by RT PCR: NEGATIVE

## 2022-05-03 LAB — LACTIC ACID, PLASMA: Lactic Acid, Venous: 1.3 mmol/L (ref 0.5–1.9)

## 2022-05-03 LAB — TROPONIN I (HIGH SENSITIVITY)
Troponin I (High Sensitivity): 6 ng/L (ref <18)
Troponin I (High Sensitivity): 5 ng/L (ref <18)

## 2022-05-03 LAB — T4, FREE: Free T4: 1.12 ng/dL (ref 0.61–1.12)

## 2022-05-03 LAB — LIPASE, BLOOD: Lipase: 26 U/L (ref 11–51)

## 2022-05-03 MED ORDER — ALBUTEROL SULFATE (2.5 MG/3ML) 0.083% IN NEBU
3.0000 mL | INHALATION_SOLUTION | RESPIRATORY_TRACT | Status: DC | PRN
  Administered 2022-05-15: 3 mL via RESPIRATORY_TRACT
  Filled 2022-05-03: qty 3

## 2022-05-03 MED ORDER — PRAVASTATIN SODIUM 40 MG PO TABS
40.0000 mg | ORAL_TABLET | Freq: Every day | ORAL | Status: DC
  Administered 2022-05-03 – 2022-05-14 (×12): 40 mg via ORAL
  Filled 2022-05-03 (×11): qty 1
  Filled 2022-05-03: qty 2

## 2022-05-03 MED ORDER — APIXABAN 5 MG PO TABS
5.0000 mg | ORAL_TABLET | Freq: Two times a day (BID) | ORAL | Status: DC
  Administered 2022-05-03 – 2022-05-14 (×24): 5 mg via ORAL
  Filled 2022-05-03 (×13): qty 1
  Filled 2022-05-03: qty 2
  Filled 2022-05-03 (×10): qty 1

## 2022-05-03 MED ORDER — TIZANIDINE HCL 4 MG PO TABS
4.0000 mg | ORAL_TABLET | Freq: Two times a day (BID) | ORAL | Status: DC
  Administered 2022-05-03 – 2022-05-08 (×10): 4 mg via ORAL
  Filled 2022-05-03: qty 1
  Filled 2022-05-03: qty 2
  Filled 2022-05-03 (×4): qty 1
  Filled 2022-05-03: qty 2
  Filled 2022-05-03: qty 1
  Filled 2022-05-03: qty 2
  Filled 2022-05-03 (×2): qty 1

## 2022-05-03 MED ORDER — DILTIAZEM HCL-DEXTROSE 125-5 MG/125ML-% IV SOLN (PREMIX)
5.0000 mg/h | INTRAVENOUS | Status: DC
  Administered 2022-05-03 (×3): 5 mg/h via INTRAVENOUS
  Filled 2022-05-03 (×2): qty 125

## 2022-05-03 MED ORDER — LORAZEPAM 2 MG/ML IJ SOLN
0.5000 mg | Freq: Once | INTRAMUSCULAR | Status: AC
  Administered 2022-05-03: 0.5 mg via INTRAVENOUS
  Filled 2022-05-03: qty 1

## 2022-05-03 MED ORDER — HYDROCODONE-ACETAMINOPHEN 5-325 MG PO TABS
2.0000 | ORAL_TABLET | Freq: Four times a day (QID) | ORAL | Status: DC | PRN
  Administered 2022-05-03 – 2022-05-07 (×5): 2 via ORAL
  Filled 2022-05-03 (×6): qty 2

## 2022-05-03 MED ORDER — SODIUM CHLORIDE 0.9 % IV SOLN
100.0000 mg | Freq: Once | INTRAVENOUS | Status: AC
  Administered 2022-05-03: 100 mg via INTRAVENOUS
  Filled 2022-05-03: qty 100

## 2022-05-03 MED ORDER — SODIUM CHLORIDE 0.9 % IV BOLUS
500.0000 mL | Freq: Once | INTRAVENOUS | Status: AC
  Administered 2022-05-03: 500 mL via INTRAVENOUS

## 2022-05-03 MED ORDER — HYDRALAZINE HCL 20 MG/ML IJ SOLN: 5.0000 mg | INTRAMUSCULAR | Status: DC | PRN

## 2022-05-03 MED ORDER — DM-GUAIFENESIN ER 30-600 MG PO TB12
1.0000 | ORAL_TABLET | Freq: Two times a day (BID) | ORAL | Status: DC | PRN
  Administered 2022-05-09 – 2022-05-10 (×2): 1 via ORAL
  Filled 2022-05-03 (×4): qty 1

## 2022-05-03 MED ORDER — DOXYCYCLINE HYCLATE 100 MG PO TABS
100.0000 mg | ORAL_TABLET | Freq: Two times a day (BID) | ORAL | Status: DC
  Administered 2022-05-03 – 2022-05-07 (×8): 100 mg via ORAL
  Filled 2022-05-03 (×8): qty 1

## 2022-05-03 MED ORDER — ONDANSETRON HCL 4 MG/2ML IJ SOLN
4.0000 mg | Freq: Three times a day (TID) | INTRAMUSCULAR | Status: DC | PRN
  Administered 2022-05-05 – 2022-05-15 (×8): 4 mg via INTRAVENOUS
  Filled 2022-05-03 (×8): qty 2

## 2022-05-03 MED ORDER — CARVEDILOL 3.125 MG PO TABS
3.1250 mg | ORAL_TABLET | Freq: Two times a day (BID) | ORAL | Status: DC
  Administered 2022-05-03 – 2022-05-04 (×2): 3.125 mg via ORAL
  Filled 2022-05-03 (×2): qty 1

## 2022-05-03 MED ORDER — SODIUM CHLORIDE 0.9 % IV SOLN
2.0000 g | INTRAVENOUS | Status: AC
  Administered 2022-05-03 – 2022-05-09 (×7): 2 g via INTRAVENOUS
  Filled 2022-05-03: qty 2
  Filled 2022-05-03 (×6): qty 20

## 2022-05-03 MED ORDER — DILTIAZEM HCL 25 MG/5ML IV SOLN
10.0000 mg | Freq: Once | INTRAVENOUS | Status: AC
  Administered 2022-05-03: 10 mg via INTRAVENOUS
  Filled 2022-05-03: qty 5

## 2022-05-03 MED ORDER — ACETAMINOPHEN 325 MG PO TABS
650.0000 mg | ORAL_TABLET | Freq: Four times a day (QID) | ORAL | Status: DC | PRN
  Administered 2022-05-07 – 2022-05-09 (×3): 650 mg via ORAL
  Filled 2022-05-03 (×4): qty 2

## 2022-05-03 NOTE — ED Notes (Signed)
Diltiazem drip stopped and restarted on new channel. Previous channel had an error

## 2022-05-03 NOTE — H&P (Signed)
History and Physical    Molly Mitchell SJG:[id number] DOB: Aug 31, 1945 DOA: 04/23/2022  Referring MD/NP/PA:   PCP: [facility]   Patient coming from:  The patient is coming from home.    Chief Complaint: cough, SOB, chest pain.    HPI: Molly Mitchell is a 77 y.o. female with medical history significant of A-fib on Eliquis, s/p of Watchman left atrial appendage closure device placement on 04/20/23, hypertension, hyperlipidemia, asthma, diastolic CHF, stroke, CKD-3A, chronic pain, carotid artery stenosis, who presents with cough, shortness of breath, chest pain.  Patient recently had Watchman left atrial appendage closure device placement on 04/20/23. She states that she has dry cough, short of breath, chest pain in the past 2 days.  Chest pain is located in the front chest, intermittent, sharp, mild to moderate, nonradiating.  Not aggravated or alleviated by any known factors.  Patient does not have fever or chills.  She has nausea and vomited yesterday, no abdominal pain or diarrhea.  Currently no active vomiting.  She also reports mild dysuria sometimes.  No hematuria.  Patient was found to have atrial fibrillation with RVR, heart rate up to 137.  Patient was given 10 mg of Cardizem with temporary improved heart rate, but heart rates increased again, and started on cardizem in ED.  Data reviewed independently and ED Course: pt was found to have negative PCR for COVID, flu and RSV, WBC 13.7, lactic acid 1.3, procalcitonin 0.19. Urinalysis (cloudy appearance, small amount of leukocyte, negative nitrite, WBC 0-5 with squamous cell contamination 21-50), slightly worsening renal function, BNP 716, trop 6 --> 5. Temperature normal, blood pressure 102/74, RR 25-37, oxygen saturation 97% on room air. Pt is admitted PCU as inpt.  CXR Mild bilateral lower lobe opacities, left greater than right, atelectasis versus pneumonia.  Small left pleural effusion.   EKG: I have personally reviewed.  A-fib,  heart rate 137, QTc 446, poor R wave progression, low voltage, T wave inversion in inferior leads V5-V6.   Review of Systems:   General: no fevers, chills, no body weight gain, has fatigue HEENT: no blurry vision, hearing changes or sore throat Respiratory: has dyspnea, coughing, no wheezing CV: has chest pain, no palpitations GI: has nausea, vomiting, no abdominal pain, diarrhea, constipation GU: has dysuria, no burning on urination, increased urinary frequency, hematuria  Ext: no leg edema Neuro: no unilateral weakness, numbness, or tingling, no vision change or hearing loss Skin: no rash, no skin tear. MSK: No muscle spasm, no deformity, no limitation of range of movement in spin Heme: No easy bruising.  Travel history: No recent long distant travel.   Allergy:  Allergies  Allergen Reactions   Amitriptyline Hcl Swelling    Facial swelling   Duricef [Cefadroxil] Swelling    Facial swelling   Levofloxacin Other (See Comments)    Severe Shoulder Pain   Tetanus Toxoid Swelling    Arm swelling   Tramadol Itching   Erythromycin Rash and Other (See Comments)    Rash on bottom    Past Medical History:  Diagnosis Date   Acute right MCA stroke 08/01/2021   a.) noted on MRI head 08/01/2021 --> punctate acute RIGHT MCA territory infarcts affecting the frontal and parietal contex consistent with microembolic infarcts in the RIGHT carotid circulation   Anemia    Arthritis    Bilateral carotid artery disease 08/18/2021   a.) CTA neck 08/18/2021: 70-80% RICA and 40% LICA   Cervicalgia    Chronic kidney disease  Chronic pain syndrome    Chronic, continuous use of opioids    a.) hydrocodone/APAP (Norco 10/325 mg)   Connective tissue disease    pt reports being diagnosed with undifferentiated connective tissue disease at some point- pt took steroids for this and has had no issues since- 10 years ago   Coronary artery disease    Cortical senile cataract    COVID 2021   DDD  (degenerative disc disease), cervical    a.) s/p C4-C6 ACDF   DDD (degenerative disc disease), lumbar    a.) s/p laminectomy + posterior L2-S1 fusion   Dysrhythmia    A-fib   History of 2019 novel coronavirus disease (COVID-19) 12/07/2018   History of blood transfusion    History of heart murmur in childhood    History of rheumatic fever    HLD (hyperlipidemia)    Hypertension    Lower extremity weakness    a.) with position changes from sitting to standing   Osteoporosis    Presence of Watchman left atrial appendage closure device 04/20/2022   27mm Watchman FLX placed by Dr. Lalla Brothers   Pseudogout    Shingles    Skin cancer of face    Spondylolisthesis of lumbar region     Past Surgical History:  Procedure Laterality Date   ABDOMINAL HYSTERECTOMY  1981   AIKEN OSTEOTOMY Right 01/30/2018   Procedure: WEIL RIGHT 2ND AND 3RD;  Surgeon: Gwyneth Revels, DPM;  Location: [facility];  Service: Podiatry;  Laterality: Right;   ANTERIOR CERVICAL DECOMP/DISCECTOMY FUSION N/A 2003   C4-C6   BACK SURGERY     pt reports hx of 3 lumber back surgeries total   CERVICAL SPINE SURGERY     pt reports having 3 cervical fusions total   ENDARTERECTOMY Right 09/21/2021   Procedure: ENDARTERECTOMY CAROTID;  Surgeon: Renford Dills, MD;  Location: [facility];  Service: Vascular;  Laterality: Right;  Right carotid   HAMMER TOE SURGERY Right 01/30/2018   Procedure: HAMMER TOE CORRECTION SECOND AND THIRD;  Surgeon: Gwyneth Revels, DPM;  Location: [facility];  Service: Podiatry;  Laterality: Right;  GENERAL WITH LOCAL   HAMMER TOE SURGERY Left 03/05/2019   Procedure: HAMMER TOE CORRECTION T1 AND T2;  Surgeon: Gwyneth Revels, DPM;  Location: [facility];  Service: Podiatry;  Laterality: Left;   LEFT ATRIAL APPENDAGE OCCLUSION N/A 04/20/2022   Procedure: LEFT ATRIAL APPENDAGE OCCLUSION;  Surgeon: Lanier Prude, MD;  Location: [facility]AS[facility] CV [facility];  Service: Cardiovascular;   Laterality: N/A;   POSTERIOR LUMBAR FUSION N/A 2001   L2-S1   ROTATOR CUFF REPAIR Right 1981   SACROPLASTY N/A 03/29/2017   S1   TEE WITHOUT CARDIOVERSION N/A 04/20/2022   Procedure: TRANSESOPHAGEAL ECHOCARDIOGRAM;  Surgeon: Lanier Prude, MD;  Location: [facility] INVASIVE CV LAB;  Service: Cardiovascular;  Laterality: N/A;   TOTAL KNEE ARTHROPLASTY Left 03/03/2013   TOTAL KNEE REVISION Left 07/31/2016   Procedure: TOTAL KNEE REVISION;  Surgeon: Donato Heinz, MD;  Location: [facility];  Service: Orthopedics;  Laterality: Left;   WEIL OSTEOTOMY Left 03/05/2019   Procedure: WEIL OSTEOTOMY X 2 LEFT;  Surgeon: Gwyneth Revels, DPM;  Location: [facility];  Service: Podiatry;  Laterality: Left;    Social History:  reports that she quit smoking about 43 years ago. Her smoking use included cigarettes. She has a 15.00 pack-year smoking history. She has never used smokeless tobacco. She reports that she does not drink alcohol and does not use drugs.  Family History:  Family History  Problem Relation Age of Onset   Hypertension Brother    Hypertension Mother    Hypertension Sister    Cancer Sister    Diabetes Paternal Grandmother    Heart failure Maternal Grandmother      Prior to Admission medications   Medication Sig Start Date End Date Taking? Authorizing Provider  amLODipine (NORVASC) 2.5 MG tablet Take 2.5-5 mg by mouth See admin instructions. Take 2.5 mg in the morning and 5 mg at bedtime    [provider]  amoxicillin (AMOXIL) 500 MG capsule Take 4 tablets by mouth 1 hour prior to dental procedures. 01/26/20   Chrismon, Jodell Cipro, PA-C  apixaban (ELIQUIS) 5 MG TABS tablet Take 1 tablet (5 mg total) by mouth 2 (two) times daily. 02/21/22   Georgie Chard D, NP  carvedilol (COREG) 3.125 MG tablet Take 3.125 mg by mouth 2 (two) times daily with a meal.    [provider]  HYDROcodone-acetaminophen (NORCO/VICODIN) 5-325 MG tablet Take 2 tablets by mouth 4 (four) times  daily. 03/13/22   [provider]  pravastatin (PRAVACHOL) 40 MG tablet Take 40 mg by mouth at bedtime.    [provider]  telmisartan (MICARDIS) 80 MG tablet Take 80 mg by mouth daily.    [provider]  tiZANidine (ZANAFLEX) 4 MG tablet Take 1 tablet (4 mg total) by mouth 2 (two) times daily. 04/20/22   Lanier Prude, MD    Physical Exam: Vitals:   05/04/2022 1209 04/22/2022 1300 04/28/2022 1311 05/05/2022 1400  BP:  (!) 97/47  (!) 93/54  Pulse: (!) 114  92 70  Resp: (!) 26  (!) 22 (!) 21  Temp:      TempSrc:      SpO2: 94%  95% 95%  Weight:      Height:       General: Not in acute distress HEENT:       Eyes: PERRL, EOMI, no scleral icterus.       ENT: No discharge from the ears and nose, no pharynx injection, no tonsillar enlargement.        Neck: No JVD, no bruit, no mass felt. Heme: No neck lymph node enlargement. Cardiac: S1/S2, RRR, No murmurs, No gallops or rubs. Respiratory: No rales, wheezing, rhonchi or rubs. GI: Soft, nondistended, nontender, no rebound pain, no organomegaly, BS present. GU: No hematuria Ext: No pitting leg edema bilaterally. 1+DP/PT pulse bilaterally. Musculoskeletal: No joint deformities, No joint redness or warmth, no limitation of ROM in spin. Skin: No rashes.  Neuro: Alert, oriented X3, cranial nerves II-XII grossly intact, moves all extremities normally. Muscle strength 5/5 in all extremities, sensation to light touch intact. Brachial reflex 2+ bilaterally. Knee reflex 1+ bilaterally. Negative Babinski's sign. Normal finger to nose test. Psych: Patient is not psychotic, no suicidal or hemocidal ideation.  Labs on Admission: I have personally reviewed following labs and imaging studies  CBC: Recent Labs  Lab 04/28/2022 0329  WBC 13.6*  NEUTROABS 9.9*  HGB 10.5*  HCT 32.7*  MCV 90.8  PLT 455*   Basic Metabolic Panel: Recent Labs  Lab 04/27/2022 0329  NA 134*  K 4.9  CL 102  CO2 24  GLUCOSE 140*  BUN 42*   CREATININE 1.23*  CALCIUM 9.0   GFR: Estimated Creatinine Clearance: 34.6 mL/min (A) (by C-G formula based on SCr of 1.23 mg/dL (H)). Liver Function Tests: Recent Labs  Lab 04/30/2022 0329  AST 58*  ALT 55*  ALKPHOS 312*  BILITOT 0.6  PROT 7.6  ALBUMIN 3.4*   Recent Labs  Lab 04/29/2022 0329  LIPASE 26   No results for input(s): "AMMONIA" in the last 168 hours. Coagulation Profile: No results for input(s): "INR", "PROTIME" in the last 168 hours. Cardiac Enzymes: No results for input(s): "CKTOTAL", "CKMB", "CKMBINDEX", "TROPONINI" in the last 168 hours. BNP (last 3 results) No results for input(s): "PROBNP" in the last 8760 hours. HbA1C: No results for input(s): "HGBA1C" in the last 72 hours. CBG: No results for input(s): "GLUCAP" in the last 168 hours. Lipid Profile: No results for input(s): "CHOL", "HDL", "LDLCALC", "TRIG", "CHOLHDL", "LDLDIRECT" in the last 72 hours. Thyroid Function Tests: Recent Labs    05/02/2022 0329  TSH 1.501  FREET4 1.12   Anemia Panel: No results for input(s): "VITAMINB12", "FOLATE", "FERRITIN", "TIBC", "IRON", "RETICCTPCT" in the last 72 hours. Urine analysis:    Component Value Date/Time   COLORURINE YELLOW (A) 05/14/2022 0811   APPEARANCEUR CLOUDY (A) 05/09/2022 0811   APPEARANCEUR Clear 02/19/2013 0916   LABSPEC 1.018 04/21/2022 0811   LABSPEC 1.010 02/19/2013 0916   PHURINE 5.0 05/11/2022 0811   GLUCOSEU NEGATIVE 04/28/2022 0811   GLUCOSEU Negative 02/19/2013 0916   HGBUR NEGATIVE 05/14/2022 0811   BILIRUBINUR NEGATIVE 04/30/2022 0811   BILIRUBINUR negative 10/23/2017 1324   BILIRUBINUR Negative 02/19/2013 0916   KETONESUR NEGATIVE 05/12/2022 0811   PROTEINUR 30 (A) 05/06/2022 0811   UROBILINOGEN 0.2 10/23/2017 1324   NITRITE NEGATIVE 05/02/2022 0811   LEUKOCYTESUR SMALL (A) 05/13/2022 0811   LEUKOCYTESUR Negative 02/19/2013 0916   Sepsis Labs: (procalcitonin:4,lacticidven:4) ) Recent Results (from the past 240  hour(s))  Resp panel by RT-PCR (RSV, Flu A&B, Covid) Anterior Nasal Swab     Status: None   Collection Time: 04/20/2022  5:37 AM   Specimen: Anterior Nasal Swab  Result Value Ref Range Status   SARS Coronavirus 2 by RT PCR NEGATIVE NEGATIVE Final    Comment: (NOTE) SARS-CoV-2 target nucleic acids are NOT DETECTED.  The SARS-CoV-2 RNA is generally detectable in upper respiratory specimens during the acute phase of infection. The lowest concentration of SARS-CoV-2 viral copies this assay can detect is 138 copies/mL. A negative result does not preclude SARS-Cov-2 infection and should not be used as the sole basis for treatment or other patient management decisions. A negative result may occur with  improper specimen collection/handling, submission of specimen other than nasopharyngeal swab, presence of viral mutation(s) within the areas targeted by this assay, and inadequate number of viral copies(<138 copies/mL). A negative result must be combined with clinical observations, patient history, and epidemiological information. The expected result is Negative.  Fact Sheet for Patients:  BloggerCourse.com  Fact Sheet for Healthcare Providers:  SeriousBroker.it  This test is no t yet approved or cleared by the [place] FDA and  has been authorized for detection and/or diagnosis of SARS-CoV-2 by FDA under an Emergency Use Authorization (EUA). This EUA will remain  in effect (meaning this test can be used) for the duration of the COVID-19 declaration under Section 564(b)(1) of the Act, 21 U.S.C.section 360bbb-3(b)(1), unless the authorization is terminated  or revoked sooner.       Influenza A by PCR NEGATIVE NEGATIVE Final   Influenza B by PCR NEGATIVE NEGATIVE Final    Comment: (NOTE) The Xpert Xpress SARS-CoV-2/FLU/RSV plus assay is intended as an aid in the diagnosis of influenza from Nasopharyngeal swab specimens and should not  be used as a sole basis for treatment.  Nasal washings and aspirates are unacceptable for Xpert Xpress SARS-CoV-2/FLU/RSV testing.  Fact Sheet for Patients: BloggerCourse.com  Fact Sheet for Healthcare Providers: SeriousBroker.it  This test is not yet approved or cleared by the [place] FDA and has been authorized for detection and/or diagnosis of SARS-CoV-2 by FDA under an Emergency Use Authorization (EUA). This EUA will remain in effect (meaning this test can be used) for the duration of the COVID-19 declaration under Section 564(b)(1) of the Act, 21 U.S.C. section 360bbb-3(b)(1), unless the authorization is terminated or revoked.     Resp Syncytial Virus by PCR NEGATIVE NEGATIVE Final    Comment: (NOTE) Fact Sheet for Patients: BloggerCourse.com  Fact Sheet for Healthcare Providers: SeriousBroker.it  This test is not yet approved or cleared by the [place] FDA and has been authorized for detection and/or diagnosis of SARS-CoV-2 by FDA under an Emergency Use Authorization (EUA). This EUA will remain in effect (meaning this test can be used) for the duration of the COVID-19 declaration under Section 564(b)(1) of the Act, 21 U.S.C. section 360bbb-3(b)(1), unless the authorization is terminated or revoked.  Performed at [facility], [address] [place], [place] [address]      Radiological Exams on Admission: DG Chest [facility] 1 View  Result Date: 05/08/2022 CLINICAL DATA:  Chest pain EXAM: PORTABLE CHEST 1 VIEW COMPARISON:  04/20/2022 FINDINGS: Mild bilateral lower lobe opacities, left greater than right, atelectasis versus pneumonia. Small left pleural effusion, new. No frank interstitial edema. Cardiomegaly. Thoracolumbar spine fixation hardware. Cervical spine fixation hardware, incompletely visualized. IMPRESSION: Mild bilateral lower lobe opacities, left  greater than right, atelectasis versus pneumonia. Small left pleural effusion. Electronically Signed   By: Charline Bills M.D.   On: 04/25/2022 03:37      Assessment/Plan Principal Problem:   CAP (community acquired pneumonia) Active Problems:   Sepsis   Chronic diastolic CHF (congestive heart failure)   Chronic atrial fibrillation with RVR   Asthma   HTN (hypertension)   Chronic pain   HLD (hyperlipidemia)   Chronic kidney disease, stage 3a   Normocytic anemia   Assessment and Plan:   Sepsis due to CAP (community acquired pneumonia): Patient meet criteria sepsis with WBC 13.6, heart rate up to 137 and RR 25-37.  Lactic acid normal 1.3.  Procalcitonin 0.19.  Patient has slightly worsening renal function with creatinine 1.23, BUN 42, GFR 46 (recent baseline creatinine 1.14 on 03/22/2022).  Her sepsis triggered A-fib with RVR.  - Will admit to PCU as inpt - IV Rocephin and doxycycline - Mucinex for cough  - Bronchodilators - Urine legionella and S. pneumococcal antigen - IVF: 500 ml of NS bolus in ED  Chronic diastolic CHF (congestive heart failure): 2D echo on 04/20/2022 showed EF of 55-60%.  Patient has elevated BNP 716, but patient does not have any leg edema, no JVD, does not seem to have CHF exacerbation. -Watch volume status closely  Chronic atrial fibrillation with RVR: Heart rate up to 137 --> 80s now -Coreg -Eliquis  Asthma -Bronchodilators  HTN (hypertension): -Hold amlodipine and Micardis since patient has risk of developing hypotension due to sepsis -IV hydralazine as needed -Continue Coreg  Chronic pain -Continue as needed Norco, -As needed Tylenol  HLD (hyperlipidemia) -Pravastatin  Chronic kidney disease, stage 3a: Slightly worsening renal function.  Baseline creatinine 1.14 on 03/22/2022 --> 1.23 today.  BUN 42, GFR 46 -monitoring volume status closely by BMP  Normocytic anemia: Hemoglobin 10.5 (12.1 on 03/22/2022), no active bleeding. -Follow-up with  CBC    DVT ppx: on Eliquis  Code Status: Full code per pt  Family Communication: I have tried to call her husband who did not pick up the phone.  I left a message to him   Disposition Plan:  Anticipate discharge back to previous environment  Consults called:  none  Admission status and Level of care: Progressive:   as inpt      Dispo: The patient is from: Home              Anticipated d/c is to: Home              Anticipated d/c date is: 2 days              Patient currently is not medically stable to d/c.    Severity of Illness:  The appropriate patient status for this patient is INPATIENT. Inpatient status is judged to be reasonable and necessary in order to provide the required intensity of service to ensure the patient's safety. The patient's presenting symptoms, physical exam findings, and initial radiographic and laboratory data in the context of their chronic comorbidities is felt to place them at high risk for further clinical deterioration. Furthermore, it is not anticipated that the patient will be medically stable for discharge from the hospital within 2 midnights of admission.   * I certify that at the point of admission it is my clinical judgment that the patient will require inpatient hospital care spanning beyond 2 midnights from the point of admission due to high intensity of service, high risk for further deterioration and high frequency of surveillance required.*       Date of Service 05/14/2022    Lorretta Harp [facility]   If 7PM-7AM, please contact night-coverage [email or web address] 05/13/2022, 3:45 PM

## 2022-05-03 NOTE — ED Provider Notes (Addendum)
[facility] Provider Note    None    (approximate)   History   Chest Pain   HPI  Molly Mitchell is a 77 y.o. female who presents to the ED from home with a chief complaint of chest pain, dizziness and shortness of breath.  Patient with a history of atrial fibrillation on Eliquis.  Has a watchman but has not been wearing her watch for the past several days.  Symptomatic x 2 days, worse tonight.  Denies recent fever/chills, cough, abdominal pain, nausea, vomiting or dysuria.     Past Medical History   Past Medical History:  Diagnosis Date   Acute right MCA stroke 08/01/2021   a.) noted on MRI head 08/01/2021 --> punctate acute RIGHT MCA territory infarcts affecting the frontal and parietal contex consistent with microembolic infarcts in the RIGHT carotid circulation   Anemia    Arthritis    Bilateral carotid artery disease 08/18/2021   a.) CTA neck 08/18/2021: 70-80% RICA and 40% LICA   Cervicalgia    Chronic kidney disease    Chronic pain syndrome    Chronic, continuous use of opioids    a.) hydrocodone/APAP (Norco 10/325 mg)   Connective tissue disease    pt reports being diagnosed with undifferentiated connective tissue disease at some point- pt took steroids for this and has had no issues since- 10 years ago   Coronary artery disease    Cortical senile cataract    COVID 2021   DDD (degenerative disc disease), cervical    a.) s/p C4-C6 ACDF   DDD (degenerative disc disease), lumbar    a.) s/p laminectomy + posterior L2-S1 fusion   Dysrhythmia    A-fib   History of 2019 novel coronavirus disease (COVID-19) 12/07/2018   History of blood transfusion    History of heart murmur in childhood    History of rheumatic fever    HLD (hyperlipidemia)    Hypertension    Lower extremity weakness    a.) with position changes from sitting to standing   Osteoporosis    Presence of Watchman left atrial appendage closure device 04/20/2022   27mm Watchman  FLX placed by Dr. Lalla Brothers   Pseudogout    Shingles    Skin cancer of face    Spondylolisthesis of lumbar region      Active Problem List   Patient Active Problem List   Diagnosis Date Noted   Rapid atrial fibrillation 04/23/2022   Atrial fibrillation 04/20/2022   Presence of Watchman left atrial appendage closure device 04/20/2022   Pseudoarthrosis of thoracic spine after fusion procedure 02/01/2022   Carotid stenosis, asymptomatic 09/21/2021   DJD (degenerative joint disease) of cervical spine 08/23/2021   GI bleed 08/23/2021   Symptomatic carotid artery stenosis with infarction 08/15/2021   Carotid stenosis 08/14/2021   Lumbar adjacent segment disease with spondylolisthesis 06/02/2021   Left ventricular systolic dysfunction 04/01/2020   Acute right-sided low back pain with right-sided sciatica 06/11/2019   Fracture of sacrum 05/15/2017   CKD (chronic kidney disease) stage 3, GFR 30-59 ml/min 02/01/2017   Hypokalemia 02/01/2017   Spondylolisthesis of lumbar region 12/18/2016   Spinal stenosis of lumbar region 10/23/2016   S/P total knee arthroplasty 07/31/2016   Bradycardia 12/20/2015   Encounter for Medicare annual wellness exam 07/27/2015   Acute bronchitis 05/13/2014   Dysfunction of eustachian tube 05/13/2014   Cardiac conduction disorder 05/13/2014   Ache in joint 05/13/2014   AB (asthmatic bronchitis) 05/13/2014  Gonalgia 05/13/2014   Asthma, cough variant 05/13/2014   Screening for depression 05/13/2014   Electrolyte imbalance 05/13/2014   Fatigue 05/13/2014   H/O total knee replacement 05/13/2014   HLD (hyperlipidemia) 05/13/2014   Elevated WBC count 05/13/2014   Drug withdrawal syndrome to opium 05/13/2014   Arthritis of knee, degenerative 05/13/2014   Osteopenia 05/13/2014   Abdominal pain, right upper quadrant 05/13/2014   Herpes zona 05/13/2014   Periapical abscess without sinus tract 05/13/2014   Pain in thoracic spine 05/13/2014   MI (mitral  incompetence) 12/29/2013   Idiopathic insomnia 10/16/2013   Polypharmacy 10/16/2013   Dizziness 07/29/2013   Long term current use of systemic steroids 07/21/2013   History of prolonged Q-T interval on ECG 05/22/2013   Benign essential HTN 05/22/2013   Personal history of other diseases of the circulatory system 05/22/2013   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 04/21/2013   Combined fat and carbohydrate induced hyperlipemia 03/31/2013   S/P lumbar spine operation 06/20/2012   Cervical post-laminectomy syndrome 06/20/2012   Status post lumbar spine operation 06/20/2012   Tumoral calcinosis 10/31/2011   Connective tissue disease, undifferentiated 10/31/2011   Effusion of knee 10/31/2011   Chondrocalcinosis due to dicalcium phosphate crystals 10/31/2011   Undifferentiated connective tissue disease 10/31/2011   Chronic cervical pain 03/28/2011   Arthralgia of ankle or foot 11/30/2010   Neoplasm of breast 10/26/2008   Acne 07/24/2008   Dizziness and giddiness 07/24/2008   Cervical pain 05/12/2008   Dyssomnia 05/12/2008   Essential (primary) hypertension 05/12/2008     Past Surgical History   Past Surgical History:  Procedure Laterality Date   ABDOMINAL HYSTERECTOMY  1981   AIKEN OSTEOTOMY Right 01/30/2018   Procedure: WEIL RIGHT 2ND AND 3RD;  Surgeon: Gwyneth Revels, DPM;  Location: [facility];  Service: Podiatry;  Laterality: Right;   ANTERIOR CERVICAL DECOMP/DISCECTOMY FUSION N/A 2003   C4-C6   BACK SURGERY     pt reports hx of 3 lumber back surgeries total   CERVICAL SPINE SURGERY     pt reports having 3 cervical fusions total   ENDARTERECTOMY Right 09/21/2021   Procedure: ENDARTERECTOMY CAROTID;  Surgeon: Renford Dills, MD;  Location: [facility];  Service: Vascular;  Laterality: Right;  Right carotid   HAMMER TOE SURGERY Right 01/30/2018   Procedure: HAMMER TOE CORRECTION SECOND AND THIRD;  Surgeon: Gwyneth Revels, DPM;  Location: [facility];  Service: Podiatry;  Laterality: Right;  GENERAL WITH LOCAL   HAMMER TOE SURGERY Left 03/05/2019   Procedure: HAMMER TOE CORRECTION T1 AND T2;  Surgeon: Gwyneth Revels, DPM;  Location: [facility];  Service: Podiatry;  Laterality: Left;   LEFT ATRIAL APPENDAGE OCCLUSION N/A 04/20/2022   Procedure: LEFT ATRIAL APPENDAGE OCCLUSION;  Surgeon: Lanier Prude, MD;  Location: [facility];  Service: Cardiovascular;  Laterality: N/A;   POSTERIOR LUMBAR FUSION N/A 2001   L2-S1   ROTATOR CUFF REPAIR Right 1981   SACROPLASTY N/A 03/29/2017   S1   TEE WITHOUT CARDIOVERSION N/A 04/20/2022   Procedure: TRANSESOPHAGEAL ECHOCARDIOGRAM;  Surgeon: Lanier Prude, MD;  Location: [facility];  Service: Cardiovascular;  Laterality: N/A;   TOTAL KNEE ARTHROPLASTY Left 03/03/2013   TOTAL KNEE REVISION Left 07/31/2016   Procedure: TOTAL KNEE REVISION;  Surgeon: Donato Heinz, MD;  Location: [facility];  Service: Orthopedics;  Laterality: Left;   WEIL OSTEOTOMY Left 03/05/2019   Procedure: WEIL OSTEOTOMY X 2 LEFT;  Surgeon: Gwyneth Revels,  DPM;  Location: [facility];  Service: Podiatry;  Laterality: Left;     Home Medications   Prior to Admission medications   Medication Sig Start Date End Date Taking? Authorizing Provider  amLODipine (NORVASC) 2.5 MG tablet Take 2.5-5 mg by mouth See admin instructions. Take 2.5 mg in the morning and 5 mg at bedtime    [provider]  amoxicillin (AMOXIL) 500 MG capsule Take 4 tablets by mouth 1 hour prior to dental procedures. 01/26/20   Chrismon, Jodell Cipro, PA-C  apixaban (ELIQUIS) 5 MG TABS tablet Take 1 tablet (5 mg total) by mouth 2 (two) times daily. 02/21/22   Georgie Chard D, NP  carvedilol (COREG) 3.125 MG tablet Take 3.125 mg by mouth 2 (two) times daily with a meal.    [provider]  HYDROcodone-acetaminophen (NORCO/VICODIN) 5-325 MG tablet Take 2 tablets by mouth 4 (four) times daily. 03/13/22   [provider]  pravastatin (PRAVACHOL) 40 MG tablet Take 40 mg by mouth at bedtime.    [provider]  telmisartan (MICARDIS) 80 MG tablet Take 80 mg by mouth daily.    [provider]  tiZANidine (ZANAFLEX) 4 MG tablet Take 1 tablet (4 mg total) by mouth 2 (two) times daily. 04/20/22   Lanier Prude, MD     Allergies  Amitriptyline hcl, Duricef [cefadroxil], Levofloxacin, Tetanus toxoid, Tramadol, and Erythromycin   Family History   Family History  Problem Relation Age of Onset   Hypertension Brother    Hypertension Mother    Hypertension Sister    Cancer Sister    Diabetes Paternal Grandmother    Heart failure Maternal Grandmother      Physical Exam  Triage Vital Signs: ED Triage Vitals  Enc Vitals Group     BP 05/06/2022 0315 111/71     Pulse Rate 05/16/2022 0315 (!) 120     Resp 05/13/2022 0315 18     Temp 04/20/2022 0315 97.8 F (36.6 C)     Temp Source 04/21/2022 0315 Oral     SpO2 04/22/2022 0315 95 %     Weight 04/28/2022 0312 145 lb (65.8 kg)     Height 04/22/2022 0312 5\' 2"  (1.575 m)     Head Circumference --      Peak Flow --      Pain Score 04/18/2022 0312 7     Pain Loc --      Pain Edu? --      Excl. in GC? --     Updated Vital Signs: BP 102/74   Pulse 84   Temp 97.8 F (36.6 C) (Oral)   Resp (!) 22   Ht 5\' 2"  (1.575 m)   Wt 65.8 kg   SpO2 97%   BMI 26.52 kg/m    General: Awake, moderate distress.  CV:  Tachycardic, irregularly irregular.  Good peripheral perfusion.  Resp:  Normal effort.  CTAB. Abd:  Nontender.  No distention.  Other:  No thyromegaly.  No pedal edema.   ED Results / Procedures / Treatments  Labs (all labs ordered are listed, but only abnormal results are displayed) Labs Reviewed  CBC WITH DIFFERENTIAL/PLATELET - Abnormal; Notable for the following components:      Result Value   WBC 13.6 (*)    RBC 3.60 (*)    Hemoglobin 10.5 (*)    HCT 32.7 (*)    Platelets 455 (*)    Neutro Abs 9.9 (*)    Monocytes  Absolute 1.3 (*)  All other components within normal limits  COMPREHENSIVE METABOLIC PANEL - Abnormal; Notable for the following components:   Sodium 134 (*)    Glucose, Bld 140 (*)    BUN 42 (*)    Creatinine, Ser 1.23 (*)    Albumin 3.4 (*)    AST 58 (*)    ALT 55 (*)    Alkaline Phosphatase 312 (*)    GFR, Estimated 46 (*)    All other components within normal limits  BRAIN NATRIURETIC PEPTIDE - Abnormal; Notable for the following components:   B Natriuretic Peptide 716.4 (*)    All other components within normal limits  RESP PANEL BY RT-PCR (RSV, FLU A&B, COVID)  [id number]  CULTURE, BLOOD (ROUTINE X 2)  CULTURE, BLOOD (ROUTINE X 2)  LIPASE, BLOOD  TSH  T4, FREE  LACTIC ACID, PLASMA  URINALYSIS, ROUTINE W REFLEX MICROSCOPIC  PROCALCITONIN  TROPONIN I (HIGH SENSITIVITY)  TROPONIN I (HIGH SENSITIVITY)     EKG  ED ECG REPORT I, Axzel Rockhill J, the attending physician, personally viewed and interpreted this ECG.   Date: 05/06/2022  EKG Time: 0314  Rate: 137  Rhythm: atrial fibrillation, rate 137  Axis: Normal  Intervals:none  ST&T Change: Nonspecific    RADIOLOGY I have independently visualized and interpreted patient's x-ray as well as noted the radiology interpretation:  Chest x-ray: Mild bilateral lower lobe opacities atelectasis versus pneumonia, small left pleural effusion  Official radiology report(s): DG Chest Port 1 View  Result Date: 05/01/2022 CLINICAL DATA:  Chest pain EXAM: PORTABLE CHEST 1 VIEW COMPARISON:  04/20/2022 FINDINGS: Mild bilateral lower lobe opacities, left greater than right, atelectasis versus pneumonia. Small left pleural effusion, new. No frank interstitial edema. Cardiomegaly. Thoracolumbar spine fixation hardware. Cervical spine fixation hardware, incompletely visualized. IMPRESSION: Mild bilateral lower lobe opacities, left greater than right, atelectasis versus pneumonia. Small left pleural effusion. Electronically Signed   By: Charline Bills M.D.   On: 05/02/2022 03:37     PROCEDURES:  Critical Care performed: Yes, see critical care procedure note(s)  CRITICAL CARE Performed by: Irean Hong   Total critical care time: 45 minutes  Critical care time was exclusive of separately billable procedures and treating other patients.  Critical care was necessary to treat or prevent imminent or life-threatening deterioration.  Critical care was time spent personally by me on the following activities: development of treatment plan with patient and/or surrogate as well as nursing, discussions with consultants, evaluation of patient's response to treatment, examination of patient, obtaining history from patient or surrogate, ordering and performing treatments and interventions, ordering and review of laboratory studies, ordering and review of radiographic studies, pulse oximetry and re-evaluation of patient's condition.   Marland Kitchen1-3 Lead EKG Interpretation  Performed by: Irean Hong, MD Authorized by: Irean Hong, MD     Interpretation: abnormal     ECG rate:  137   ECG rate assessment: tachycardic     Rhythm: atrial fibrillation     Ectopy: none     Conduction: normal   Comments:     Patient placed on cardiac monitor to evaluate for arrhythmias    MEDICATIONS ORDERED IN ED: Medications  diltiazem (CARDIZEM) 125 mg in dextrose 5% 125 mL (1 mg/mL) infusion (5 mg/hr Intravenous New Bag/Given 04/23/2022 0635)  doxycycline (VIBRAMYCIN) 100 mg in sodium chloride 0.9 % 250 mL IVPB (100 mg Intravenous New Bag/Given 05/02/2022 0544)  LORazepam (ATIVAN) injection 0.5 mg (has no administration in time range)  diltiazem (CARDIZEM) injection 10 mg (  10 mg Intravenous Given 05/12/2022 0339)  sodium chloride 0.9 % bolus 500 mL (500 mLs Intravenous New Bag/Given 05/01/2022 0533)  LORazepam (ATIVAN) injection 0.5 mg (0.5 mg Intravenous Given 04/27/2022 0630)     IMPRESSION / MDM / ASSESSMENT AND PLAN / ED COURSE  I reviewed the triage vital signs  and the nursing notes.                             77 year old female presenting with chest pain, shortness of breath and dizziness. Differential diagnosis includes, but is not limited to, ACS, aortic dissection, pulmonary embolism, cardiac tamponade, pneumothorax, pneumonia, pericarditis, myocarditis, GI-related causes including esophagitis/gastritis, and musculoskeletal chest wall pain.   I have personally reviewed patient's records and note a dentist visit on 04/26/2022 for oral facial pain.  Also note recent Watchman implantation on 04/20/2022.  Patient's presentation is most consistent with acute presentation with potential threat to life or bodily function.  Patient is on cardiac monitor.  Will obtain cardiac panel, chest x-ray.  Administer IV Cardizem and reassess.  Clinical Course as of 04/20/2022 0651  Wed May 03, 2022  0356 Heart rate 84 after ministration of IV Cardizem.  Will obtain repeat EKG. [JS]  0417 Heart rate back up to 130s on repeat EKG.  Will initiate Cardizem infusion. [JS]  0457 Heart rate 70s to 80s before initiation of Cardizem drip.  Patient still feels bad.  Chest x-ray demonstrates atelectasis versus pneumonia.  Patient denies fever but states she has been coughing.  Will obtain lactic acid, blood cultures, respiratory panel.  Cover empirically with IV antibiotics.  Will consult hospital services for evaluation and admission. [JS]  [id number] Patient appears anxious, sitting at the edge of the bed complaining of shortness of breath.  Room air saturation 98%.  Heart rate 150s.  Extremely anxious.  Will administer low-dose IV Ativan, trial nonrebreather. [JS]  [id number] Some improvement after low-dose IV Ativan.  Patient tells me she usually has to take an anxiolytic before she goes to the doctor or emergency room.  Will give another 0.5 mg dose of IV Ativan. [JS]    Clinical Course User Index [JS] Irean Hong, MD     FINAL CLINICAL IMPRESSION(S) / ED DIAGNOSES   Final  diagnoses:  Chest pain, unspecified type  Atrial fibrillation with rapid ventricular response  Community acquired pneumonia, unspecified laterality  AKI (acute kidney injury)     Rx / DC Orders   ED Discharge Orders     None        Note:  This document was prepared using Dragon voice recognition software and may include unintentional dictation errors.   Irean Hong, MD 05/14/2022 1610    Irean Hong, MD 04/19/2022 9604    Irean Hong, MD 05/14/2022 [phone]

## 2022-05-03 NOTE — ED Triage Notes (Signed)
Pt to triage via w/c with no distress noted; pt reports upper CP accomp by dizziness, SHOB; st hx afib

## 2022-05-04 DIAGNOSIS — A419 Sepsis, unspecified organism: Secondary | ICD-10-CM

## 2022-05-04 DIAGNOSIS — G894 Chronic pain syndrome: Secondary | ICD-10-CM

## 2022-05-04 DIAGNOSIS — J189 Pneumonia, unspecified organism: Secondary | ICD-10-CM | POA: Diagnosis not present

## 2022-05-04 DIAGNOSIS — N1831 Chronic kidney disease, stage 3a: Secondary | ICD-10-CM

## 2022-05-04 DIAGNOSIS — I10 Essential (primary) hypertension: Secondary | ICD-10-CM | POA: Diagnosis not present

## 2022-05-04 DIAGNOSIS — J4520 Mild intermittent asthma, uncomplicated: Secondary | ICD-10-CM | POA: Diagnosis not present

## 2022-05-04 LAB — CBC
HCT: 31.4 % — ABNORMAL LOW (ref 36.0–46.0)
Hemoglobin: 10.1 g/dL — ABNORMAL LOW (ref 12.0–15.0)
MCH: 29.4 pg (ref 26.0–34.0)
MCHC: 32.2 g/dL (ref 30.0–36.0)
MCV: 91.5 fL (ref 80.0–100.0)
Platelets: 419 10*3/uL — ABNORMAL HIGH (ref 150–400)
RBC: 3.43 MIL/uL — ABNORMAL LOW (ref 3.87–5.11)
RDW: 15.4 % (ref 11.5–15.5)
WBC: 13.6 10*3/uL — ABNORMAL HIGH (ref 4.0–10.5)
nRBC: 0 % (ref 0.0–0.2)

## 2022-05-04 LAB — BASIC METABOLIC PANEL
Anion gap: 10 (ref 5–15)
BUN: 35 mg/dL — ABNORMAL HIGH (ref 8–23)
CO2: 23 mmol/L (ref 22–32)
Calcium: 8.7 mg/dL — ABNORMAL LOW (ref 8.9–10.3)
Chloride: 104 mmol/L (ref 98–111)
Creatinine, Ser: 1.19 mg/dL — ABNORMAL HIGH (ref 0.44–1.00)
GFR, Estimated: 47 mL/min — ABNORMAL LOW (ref >60)
Glucose, Bld: 145 mg/dL — ABNORMAL HIGH (ref 70–99)
Potassium: 4.6 mmol/L (ref 3.5–5.1)
Sodium: 137 mmol/L (ref 135–145)

## 2022-05-04 LAB — CULTURE, BLOOD (ROUTINE X 2): Special Requests: ADEQUATE

## 2022-05-04 LAB — LEGIONELLA PNEUMOPHILA SEROGP 1 UR AG: L. pneumophila Serogp 1 Ur Ag: NEGATIVE

## 2022-05-04 LAB — MAGNESIUM: Magnesium: 2.5 mg/dL — ABNORMAL HIGH (ref 1.7–2.4)

## 2022-05-04 MED ORDER — ALPRAZOLAM 0.25 MG PO TABS
0.2500 mg | ORAL_TABLET | Freq: Three times a day (TID) | ORAL | Status: DC | PRN
  Administered 2022-05-04 – 2022-05-07 (×6): 0.25 mg via ORAL
  Filled 2022-05-04 (×6): qty 1

## 2022-05-04 MED ORDER — AMIODARONE LOAD VIA INFUSION
150.0000 mg | Freq: Once | INTRAVENOUS | Status: AC
  Administered 2022-05-04: 150 mg via INTRAVENOUS
  Filled 2022-05-04: qty 83.34

## 2022-05-04 MED ORDER — DILTIAZEM HCL 60 MG PO TABS: 30.0000 mg | ORAL_TABLET | Freq: Four times a day (QID) | ORAL | Status: DC

## 2022-05-04 MED ORDER — METOPROLOL TARTRATE 25 MG PO TABS
12.5000 mg | ORAL_TABLET | Freq: Four times a day (QID) | ORAL | Status: DC
  Administered 2022-05-04 – 2022-05-05 (×4): 12.5 mg via ORAL
  Filled 2022-05-04 (×4): qty 1

## 2022-05-04 MED ORDER — AMIODARONE HCL IN DEXTROSE 360-4.14 MG/200ML-% IV SOLN
60.0000 mg/h | INTRAVENOUS | Status: AC
  Administered 2022-05-04 (×2): 60 mg/h via INTRAVENOUS
  Filled 2022-05-04 (×2): qty 200

## 2022-05-04 MED ORDER — CLONAZEPAM 0.125 MG PO TBDP
0.1250 mg | ORAL_TABLET | Freq: Once | ORAL | Status: AC
  Administered 2022-05-04: 0.125 mg via ORAL
  Filled 2022-05-04: qty 1

## 2022-05-04 MED ORDER — FUROSEMIDE 10 MG/ML IJ SOLN
40.0000 mg | Freq: Once | INTRAMUSCULAR | Status: AC
  Administered 2022-05-04: 40 mg via INTRAVENOUS
  Filled 2022-05-04: qty 4

## 2022-05-04 MED ORDER — DIAZEPAM 5 MG PO TABS
5.0000 mg | ORAL_TABLET | Freq: Four times a day (QID) | ORAL | Status: DC | PRN
  Administered 2022-05-04: 5 mg via ORAL
  Filled 2022-05-04: qty 1

## 2022-05-04 MED ORDER — DILTIAZEM HCL 30 MG PO TABS
30.0000 mg | ORAL_TABLET | Freq: Four times a day (QID) | ORAL | Status: DC
  Administered 2022-05-04 – 2022-05-05 (×2): 30 mg via ORAL
  Filled 2022-05-04 (×2): qty 1

## 2022-05-04 MED ORDER — BUSPIRONE HCL 10 MG PO TABS
5.0000 mg | ORAL_TABLET | Freq: Three times a day (TID) | ORAL | Status: DC | PRN
  Administered 2022-05-05 – 2022-05-07 (×4): 5 mg via ORAL
  Filled 2022-05-04 (×4): qty 1

## 2022-05-04 MED ORDER — DIAZEPAM 2 MG PO TABS: 2.0000 mg | ORAL_TABLET | Freq: Four times a day (QID) | ORAL | Status: DC | PRN

## 2022-05-04 MED ORDER — AMIODARONE HCL IN DEXTROSE 360-4.14 MG/200ML-% IV SOLN
30.0000 mg/h | INTRAVENOUS | Status: AC
  Administered 2022-05-04 – 2022-05-05 (×3): 30 mg/h via INTRAVENOUS
  Filled 2022-05-04 (×2): qty 200

## 2022-05-04 NOTE — ED Notes (Signed)
Patient is extremely anxious. She has woken up and called out multiple times stating she cannot breath. Oxygen sats have remained above 93%. Patient has taken brief naps and wakes up tearful and panicked, requesting something for anxiety. This nurse has used verbal calming and distraction to help alleviate anxiety.

## 2022-05-04 NOTE — Care Management Important Message (Signed)
Important Message  Patient Details  Name: Molly Mitchell MRN: [id number] Date of Birth: [birth year]   Medicare Important Message Given:  N/A - LOS <3 / Initial given by admissions     Johnell Comings 05/04/2022, 4:25 PM

## 2022-05-04 NOTE — Progress Notes (Signed)
PROGRESS NOTE    Molly Mitchell   VWU:[id number] DOB: 1945-02-12  DOA: 04/30/2022 Date of Service: 05/04/22 PCP: [facility]     Brief Narrative / Hospital Course:  Molly Mitchell is a 77 y.o. female with medical history significant of A-fib on Eliquis, s/p of Watchman left atrial appendage closure device placement on 04/20/23, hypertension, hyperlipidemia, asthma, diastolic CHF, stroke, CKD-3A, chronic pain, carotid artery stenosis, who presents to the ED from home 04/29/2022 with cough, shortness of breath, chest pain x2 days. Patient recently had Watchman left atrial appendage closure device placement on 04/20/23.  04/17: atrial fibrillation with RVR, heart rate up to 137.  Patient was given 10 mg of Cardizem with temporary improved heart rate, but heart rates increased again, and started on cardizem drip in ED. Negative PCR for COVID, flu and RSV, WBC 13.7, lactic acid 1.3, procalcitonin 0.19. Urinalysis (cloudy appearance, small amount of leukocyte, negative nitrite, WBC 0-5 with squamous cell contamination 21-50), slightly worsening renal function, BNP 716, trop 6 --> 5. Temperature normal, blood pressure 102/74, RR 25-37, oxygen saturation 97% on room air. Pt is admitted PCU as inpt. Tx for Afib RVR and sepsis d/t CAP. Hr initially improved to 80s. 04/18: remains on cardizem gtt, significantly anxious and subsequently sedated w/ benzodiazepine, low BP, improved in the afternoon. Echo records reviewed from Watchman device placement - TEE 04/20/22 LVEF 55-60% normal LV and RV fxn, LA mod dilated. Cardiology following to adjust rate/rhythm control.   Consultants:  Cardiology   Procedures: None       ASSESSMENT & PLAN:   Principal Problem:   CAP (community acquired pneumonia) Active Problems:   Sepsis   Chronic diastolic CHF (congestive heart failure)   Chronic atrial fibrillation with RVR   Asthma   HTN (hypertension)   Chronic pain   HLD (hyperlipidemia)   Chronic  kidney disease, stage 3a   Normocytic anemia  Sepsis due to CAP (community acquired pneumonia) vs SIRS d/t Afib RVR and anxiety  Elevated HR and RR can be either/both etiology Patient meets criteria with WBC 13.6, heart rate up to 137 and RR 25-37.  Lactic acid normal 1.3.  Procalcitonin 0.19.  Patient has slightly worsening renal function with creatinine 1.23, BUN 42, GFR 46 (recent baseline creatinine 1.14 on 03/22/2022).    IV Rocephin and doxycycline to cover PNA Antianxiety  Trial Lasix - question rales on exam / pulmonary edema on CXR  Mucinex for cough  Bronchodilators Urine legionella and S. pneumococcal antigen IVF: 500 ml of NS bolus in ED - hold any further fluids at this time   Acute hypoxic respiratory failure  Supplemental O2 as needed   Chronic diastolic CHF (congestive heart failure):  2D echo on 04/20/2022 showed EF of 55-60%.   Patient has elevated BNP 716, but patient does not have any leg edema, no JVD, does not seem to have CHF exacerbation. Watch volume status closely   Chronic atrial fibrillation with acute RVR:  Heart rate up to 137 --> 80s --> 130s again Coreg Diltiazem gtt per cardiology  Eliquis Cardiology consult    Asthma Bronchodilators   HTN (hypertension): Hold amlodipine and Micardis since patient has risk of developing hypotension due to sepsis IV hydralazine as needed Continue Coreg   Chronic pain Continue as needed Norco, As needed Tylenol   HLD (hyperlipidemia) Pravastatin   Chronic kidney disease, stage 3a:  Slightly worsening renal function.   Baseline creatinine 1.14 on 03/22/2022 --> 1.23 on admission.  BUN 42, GFR 46 monitoring BMP   Normocytic anemia:  Hemoglobin 10.5 (12.1 on 03/22/2022), no active bleeding. Follow-up with CBC   DVT prophylaxis: Eliquis (home med for Afib) Pertinent IV fluids/nutrition: no continuous IV fluids at this time  Central lines / invasive devices: none  Code Status: FULL CODE ACP documents  reviewed: none on file   Current Admission Status: inpatient, progressive unit d/t dilt gtt   TOC needs / Dispo plan: anticipate d/c to previous home environment, TBD Barriers to discharge / significant pending items: pending clinical improvement expect will be here 1-2 more days              Subjective / Brief ROS:  Patient reports severely anxious, feels SOB and asked to describe further, other than she cannot get a good breath, nothing makes better or worse, constant  Denies CP Pain controlled.  Denies new weakness.  Reports no concerns w/ urination/defecation.   Family Communication: husband at bedside on rounds     Objective Findings:  Vitals:   05/04/22 1139 05/04/22 1230 05/04/22 1400 05/04/22 1417  BP: (!) 85/52 99/71 125/83   Pulse:  (!) 116 (!) 121   Resp: (!) 21 (!) 22 (!) 28   Temp:    97.9 F (36.6 C)  TempSrc:    Oral  SpO2:  97% 98%   Weight:      Height:       No intake or output data in the 24 hours ending 05/04/22 1425  Filed Weights   04/22/2022 0312  Weight: 65.8 kg    Examination:  Physical Exam Constitutional:      Appearance: She is not toxic-appearing.  Cardiovascular:     Rate and Rhythm: Tachycardia present. Rhythm irregular.     Heart sounds: Murmur heard.     Systolic murmur is present.  Pulmonary:     Effort: Tachypnea present. No respiratory distress.     Breath sounds: Examination of the right-lower field reveals rales. Examination of the left-lower field reveals rales. Rales present.  Musculoskeletal:     Right lower leg: No edema.     Left lower leg: No edema.  Skin:    General: Skin is warm and dry.  Neurological:     General: No focal deficit present.     Mental Status: She is alert and oriented to person, place, and time.  Psychiatric:        Mood and Affect: Mood is anxious.          Scheduled Medications:   apixaban  5 mg Oral BID   diltiazem  30 mg Oral Q6H   doxycycline  100 mg Oral Q12H    metoprolol tartrate  12.5 mg Oral Q6H   pravastatin  40 mg Oral QHS   tiZANidine  4 mg Oral BID    Continuous Infusions:  cefTRIAXone (ROCEPHIN)  IV Stopped (05/04/22 1202)    PRN Medications:  acetaminophen, albuterol, dextromethorphan-guaiFENesin, hydrALAZINE, HYDROcodone-acetaminophen, ondansetron (ZOFRAN) IV  Antimicrobials from admission:  Anti-infectives (From admission, onward)    Start     Dose/Rate Route Frequency Ordered Stop   05/02/2022 1900  doxycycline (VIBRA-TABS) tablet 100 mg        100 mg Oral Every 12 hours 05/09/2022 1129 05/10/22 2159   05/07/2022 1200  cefTRIAXone (ROCEPHIN) 2 g in sodium chloride 0.9 % 100 mL IVPB        2 g 200 mL/hr over 30 Minutes Intravenous Every 24 hours 05/04/2022 1129 05/10/22 1159  05/12/2022 0600  doxycycline (VIBRAMYCIN) 100 mg in sodium chloride 0.9 % 250 mL IVPB        100 mg 125 mL/hr over 120 Minutes Intravenous  Once 05/07/2022 0459 04/20/2022 0810           Data Reviewed:  I have personally reviewed the following...  CBC: Recent Labs  Lab 04/17/2022 0329 05/04/22 0418  WBC 13.6* 13.6*  NEUTROABS 9.9*  --   HGB 10.5* 10.1*  HCT 32.7* 31.4*  MCV 90.8 91.5  PLT 455* 419*   Basic Metabolic Panel: Recent Labs  Lab 04/17/2022 0329 05/04/22 0418  NA 134* 137  K 4.9 4.6  CL 102 104  CO2 24 23  GLUCOSE 140* 145*  BUN 42* 35*  CREATININE 1.23* 1.19*  CALCIUM 9.0 8.7*  MG  --  2.5*   GFR: Estimated Creatinine Clearance: 35.8 mL/min (A) (by C-G formula based on SCr of 1.19 mg/dL (H)). Liver Function Tests: Recent Labs  Lab 04/24/2022 0329  AST 58*  ALT 55*  ALKPHOS 312*  BILITOT 0.6  PROT 7.6  ALBUMIN 3.4*   Recent Labs  Lab 05/10/2022 0329  LIPASE 26   No results for input(s): "AMMONIA" in the last 168 hours. Coagulation Profile: No results for input(s): "INR", "PROTIME" in the last 168 hours. Cardiac Enzymes: No results for input(s): "CKTOTAL", "CKMB", "CKMBINDEX", "TROPONINI" in the last 168 hours. BNP  (last 3 results) No results for input(s): "PROBNP" in the last 8760 hours. HbA1C: No results for input(s): "HGBA1C" in the last 72 hours. CBG: No results for input(s): "GLUCAP" in the last 168 hours. Lipid Profile: No results for input(s): "CHOL", "HDL", "LDLCALC", "TRIG", "CHOLHDL", "LDLDIRECT" in the last 72 hours. Thyroid Function Tests: Recent Labs    04/21/2022 0329  TSH 1.501  FREET4 1.12   Anemia Panel: No results for input(s): "VITAMINB12", "FOLATE", "FERRITIN", "TIBC", "IRON", "RETICCTPCT" in the last 72 hours. Most Recent Urinalysis On File:     Component Value Date/Time   COLORURINE YELLOW (A) 04/25/2022 0811   APPEARANCEUR CLOUDY (A) 05/04/2022 0811   APPEARANCEUR Clear 02/19/2013 0916   LABSPEC 1.018 05/06/2022 0811   LABSPEC 1.010 02/19/2013 0916   PHURINE 5.0 05/02/2022 0811   GLUCOSEU NEGATIVE 05/07/2022 0811   GLUCOSEU Negative 02/19/2013 0916   HGBUR NEGATIVE 04/28/2022 0811   BILIRUBINUR NEGATIVE 04/25/2022 0811   BILIRUBINUR negative 10/23/2017 1324   BILIRUBINUR Negative 02/19/2013 0916   KETONESUR NEGATIVE 04/29/2022 0811   PROTEINUR 30 (A) 05/02/2022 0811   UROBILINOGEN 0.2 10/23/2017 1324   NITRITE NEGATIVE 05/05/2022 0811   LEUKOCYTESUR SMALL (A) 04/28/2022 0811   LEUKOCYTESUR Negative 02/19/2013 0916   Sepsis Labs: (procalcitonin:4,lacticidven:4) Microbiology: Recent Results (from the past 240 hour(s))  Culture, blood (routine x 2)     Status: None (Preliminary result)   Collection Time: 04/18/2022  5:37 AM   Specimen: BLOOD RIGHT ARM  Result Value Ref Range Status   Specimen Description BLOOD RIGHT ARM  Final   Special Requests   Final    BOTTLES DRAWN AEROBIC AND ANAEROBIC Blood Culture adequate volume   Culture   Final    NO GROWTH 1 DAY Performed at [facility], [address] [place], [place] [address]    Report Status PENDING  Incomplete  Resp panel by RT-PCR (RSV, Flu A&B, Covid) Anterior Nasal Swab     Status:  None   Collection Time: 05/06/2022  5:37 AM   Specimen: Anterior Nasal Swab  Result Value Ref Range Status  SARS Coronavirus 2 by RT PCR NEGATIVE NEGATIVE Final    Comment: (NOTE) SARS-CoV-2 target nucleic acids are NOT DETECTED.  The SARS-CoV-2 RNA is generally detectable in upper respiratory specimens during the acute phase of infection. The lowest concentration of SARS-CoV-2 viral copies this assay can detect is 138 copies/mL. A negative result does not preclude SARS-Cov-2 infection and should not be used as the sole basis for treatment or other patient management decisions. A negative result may occur with  improper specimen collection/handling, submission of specimen other than nasopharyngeal swab, presence of viral mutation(s) within the areas targeted by this assay, and inadequate number of viral copies(<138 copies/mL). A negative result must be combined with clinical observations, patient history, and epidemiological information. The expected result is Negative.  Fact Sheet for Patients:  BloggerCourse.com  Fact Sheet for Healthcare Providers:  SeriousBroker.it  This test is no t yet approved or cleared by the [place] FDA and  has been authorized for detection and/or diagnosis of SARS-CoV-2 by FDA under an Emergency Use Authorization (EUA). This EUA will remain  in effect (meaning this test can be used) for the duration of the COVID-19 declaration under Section 564(b)(1) of the Act, 21 U.S.C.section 360bbb-3(b)(1), unless the authorization is terminated  or revoked sooner.       Influenza A by PCR NEGATIVE NEGATIVE Final   Influenza B by PCR NEGATIVE NEGATIVE Final    Comment: (NOTE) The Xpert Xpress SARS-CoV-2/FLU/RSV plus assay is intended as an aid in the diagnosis of influenza from Nasopharyngeal swab specimens and should not be used as a sole basis for treatment. Nasal washings and aspirates are unacceptable  for Xpert Xpress SARS-CoV-2/FLU/RSV testing.  Fact Sheet for Patients: BloggerCourse.com  Fact Sheet for Healthcare Providers: SeriousBroker.it  This test is not yet approved or cleared by the [place] FDA and has been authorized for detection and/or diagnosis of SARS-CoV-2 by FDA under an Emergency Use Authorization (EUA). This EUA will remain in effect (meaning this test can be used) for the duration of the COVID-19 declaration under Section 564(b)(1) of the Act, 21 U.S.C. section 360bbb-3(b)(1), unless the authorization is terminated or revoked.     Resp Syncytial Virus by PCR NEGATIVE NEGATIVE Final    Comment: (NOTE) Fact Sheet for Patients: BloggerCourse.com  Fact Sheet for Healthcare Providers: SeriousBroker.it  This test is not yet approved or cleared by the [place] FDA and has been authorized for detection and/or diagnosis of SARS-CoV-2 by FDA under an Emergency Use Authorization (EUA). This EUA will remain in effect (meaning this test can be used) for the duration of the COVID-19 declaration under Section 564(b)(1) of the Act, 21 U.S.C. section 360bbb-3(b)(1), unless the authorization is terminated or revoked.  Performed at [facility], [address] [place], [place] [address]       Radiology Studies last 3 days: [facility] 1 View  Result Date: 05/16/2022 CLINICAL DATA:  Chest pain EXAM: PORTABLE CHEST 1 VIEW COMPARISON:  04/20/2022 FINDINGS: Mild bilateral lower lobe opacities, left greater than right, atelectasis versus pneumonia. Small left pleural effusion, new. No frank interstitial edema. Cardiomegaly. Thoracolumbar spine fixation hardware. Cervical spine fixation hardware, incompletely visualized. IMPRESSION: Mild bilateral lower lobe opacities, left greater than right, atelectasis versus pneumonia. Small left pleural effusion.  Electronically Signed   By: Charline Bills M.D.   On: 04/17/2022 03:37             LOS: 1 day    Time spent: 50 min  Sunnie Nielsen, DO [facility] 05/04/2022, 2:25 PM    Dictation software may have been used to generate the above note. Typos may occur and escape review in typed/dictated notes. Please contact Dr Lyn Hollingshead directly for clarity if needed.  Staff may message me via secure chat in Epic  but this may not receive an immediate response,  please page me for urgent matters!  If 7PM-7AM, please contact night coverage [email or web address]

## 2022-05-04 NOTE — Consult Note (Signed)
[facility] CLINIC CARDIOLOGY CONSULT NOTE       Patient ID: Molly Mitchell MRN: [id number] DOB/AGE: [birth year] 77 y.o.  Admit date: 05/14/2022 Referring Physician Dr. Sunnie Nielsen  Primary Physician Dr. Nemiah Commander Primary Cardiologist Minda Ditto, PA-C  Reason for Consultation AF RVR  HPI: Molly Mitchell is a 77yoF with a PMH of paroxysmal AF s/p recent Watchman (LAAO closure) placement 4/4, HFpEF, right CEA (09/2021), s/p spinal fusion who presented to [facility] ED 05/05/2022 with cough, shortness of breath, and chest pain for 2 days. Initial concern for CAP, but symptoms ultimately felt to be more related to acute on chronic HFpEF and decompensation from atrial fibrillation with RVR.  Cardiology is consulted for further assistance.  History is obtained entirely from the patient's chart as she had recently received some Valium and is very sedated, awakens to sternal rub and tells me she "feels confused" and has to be reoriented to place and situation.  On telemetry she is in atrial fibrillation with variable rate from high 90s to 110 at rest with paroxysms to the 130s to 140s while on a diltiazem infusion.  On admission she admitted to cough, shortness of breath, chest pain for 2 days initial concern was for an acquired pneumonia with slight leukocytosis to 13K, and CXR with bilateral lower lobe opacities c/f either atelectasis vs PNA and a small pleural effusion. She was extremely anxious while in AF RVR. BNP elevated to 700 and a slight AKI. HR back up to the 140s requiring uptitration of diltiazem infusion, and c/f acute decompensated HFpEF, thus prompting cardiology consult.  Review of systems limited by patient's sedation.     Past Medical History:  Diagnosis Date   Acute right MCA stroke 08/01/2021   a.) noted on MRI head 08/01/2021 --> punctate acute RIGHT MCA territory infarcts affecting the frontal and parietal contex consistent with microembolic infarcts in the RIGHT carotid  circulation   Anemia    Arthritis    Bilateral carotid artery disease 08/18/2021   a.) CTA neck 08/18/2021: 70-80% RICA and 40% LICA   Cervicalgia    Chronic kidney disease    Chronic pain syndrome    Chronic, continuous use of opioids    a.) hydrocodone/APAP (Norco 10/325 mg)   Connective tissue disease    pt reports being diagnosed with undifferentiated connective tissue disease at some point- pt took steroids for this and has had no issues since- 10 years ago   Coronary artery disease    Cortical senile cataract    COVID 2021   DDD (degenerative disc disease), cervical    a.) s/p C4-C6 ACDF   DDD (degenerative disc disease), lumbar    a.) s/p laminectomy + posterior L2-S1 fusion   Dysrhythmia    A-fib   History of 2019 novel coronavirus disease (COVID-19) 12/07/2018   History of blood transfusion    History of heart murmur in childhood    History of rheumatic fever    HLD (hyperlipidemia)    Hypertension    Lower extremity weakness    a.) with position changes from sitting to standing   Osteoporosis    Presence of Watchman left atrial appendage closure device 04/20/2022   27mm Watchman FLX placed by Dr. Lalla Brothers   Pseudogout    Shingles    Skin cancer of face    Spondylolisthesis of lumbar region     Past Surgical History:  Procedure Laterality Date   ABDOMINAL HYSTERECTOMY  1981   AIKEN OSTEOTOMY Right 01/30/2018  Procedure: WEIL RIGHT 2ND AND 3RD;  Surgeon: Gwyneth Revels, DPM;  Location: [facility];  Service: Podiatry;  Laterality: Right;   ANTERIOR CERVICAL DECOMP/DISCECTOMY FUSION N/A 2003   C4-C6   BACK SURGERY     pt reports hx of 3 lumber back surgeries total   CERVICAL SPINE SURGERY     pt reports having 3 cervical fusions total   ENDARTERECTOMY Right 09/21/2021   Procedure: ENDARTERECTOMY CAROTID;  Surgeon: Renford Dills, MD;  Location: [facility] ORS;  Service: Vascular;  Laterality: Right;  Right carotid   HAMMER TOE SURGERY Right 01/30/2018    Procedure: HAMMER TOE CORRECTION SECOND AND THIRD;  Surgeon: Gwyneth Revels, DPM;  Location: [facility];  Service: Podiatry;  Laterality: Right;  GENERAL WITH LOCAL   HAMMER TOE SURGERY Left 03/05/2019   Procedure: HAMMER TOE CORRECTION T1 AND T2;  Surgeon: Gwyneth Revels, DPM;  Location: [facility];  Service: Podiatry;  Laterality: Left;   LEFT ATRIAL APPENDAGE OCCLUSION N/A 04/20/2022   Procedure: LEFT ATRIAL APPENDAGE OCCLUSION;  Surgeon: Lanier Prude, MD;  Location: [facility] INVASIVE CV LAB;  Service: Cardiovascular;  Laterality: N/A;   POSTERIOR LUMBAR FUSION N/A 2001   L2-S1   ROTATOR CUFF REPAIR Right 1981   SACROPLASTY N/A 03/29/2017   S1   TEE WITHOUT CARDIOVERSION N/A 04/20/2022   Procedure: TRANSESOPHAGEAL ECHOCARDIOGRAM;  Surgeon: Lanier Prude, MD;  Location: [facility] INVASIVE CV LAB;  Service: Cardiovascular;  Laterality: N/A;   TOTAL KNEE ARTHROPLASTY Left 03/03/2013   TOTAL KNEE REVISION Left 07/31/2016   Procedure: TOTAL KNEE REVISION;  Surgeon: Donato Heinz, MD;  Location: [facility] ORS;  Service: Orthopedics;  Laterality: Left;   WEIL OSTEOTOMY Left 03/05/2019   Procedure: WEIL OSTEOTOMY X 2 LEFT;  Surgeon: Gwyneth Revels, DPM;  Location: [facility];  Service: Podiatry;  Laterality: Left;    (Not in a hospital admission)  Social History   Socioeconomic History   Marital status: Married    Spouse name: [name]   Number of children: 2   Years of education: Not on file   Highest education level: High school graduate  Occupational History   Occupation: retired   Occupation: Retired Engineer, [employer]  Tobacco Use   Smoking status: Former    Packs/day: 1.00    Years: 15.00    Additional pack years: 0.00    Total pack years: 15.00    Types: Cigarettes    Quit date: 04/17/1979    Years since quitting: 43.0   Smokeless tobacco: Never  Vaping Use   Vaping Use: Never used  Substance and Sexual Activity   Alcohol use: No     Alcohol/week: 0.0 standard drinks of alcohol   Drug use: No   Sexual activity: Not on file  Other Topics Concern   Not on file  Social History Narrative   Not on file   Social Determinants of Health   Financial Resource Strain: Low Risk  (03/23/2020)   Overall Financial Resource Strain (CARDIA)    Difficulty of Paying Living Expenses: Not hard at all  Food Insecurity: No Food Insecurity (05/04/2022)   Hunger Vital Sign    Worried About Running Out of Food in the Last Year: Never true    Ran Out of Food in the Last Year: Never true  Transportation Needs: No Transportation Needs (05/04/2022)   PRAPARE - Administrator, Civil Service (Medical): No    Lack of Transportation (Non-Medical): No  Physical  Activity: Sufficiently Active (03/23/2020)   Exercise Vital Sign    Days of Exercise per Week: 3 days    Minutes of Exercise per Session: 60 min  Stress: No Stress Concern Present (03/23/2020)   [employer] of Occupational Health - Occupational Stress Questionnaire    Feeling of Stress : Not at all  Social Connections: Socially Integrated (03/23/2020)   Social Connection and Isolation Panel [NHANES]    Frequency of Communication with Friends and Family: Never    Frequency of Social Gatherings with Friends and Family: More than three times a week    Attends Religious Services: More than 4 times per year    Active Member of Clubs or Organizations: Yes    Attends Banker Meetings: More than 4 times per year    Marital Status: Married  Catering manager Violence: Not At Risk (05/04/2022)   Humiliation, Afraid, Rape, and Kick questionnaire    Fear of Current or Ex-Partner: No    Emotionally Abused: No    Physically Abused: No    Sexually Abused: No    Family History  Problem Relation Age of Onset   Hypertension Brother    Hypertension Mother    Hypertension Sister    Cancer Sister    Diabetes Paternal Grandmother    Heart failure Maternal Grandmother      No  intake or output data in the 24 hours ending 05/04/22 1205  Vitals:   05/04/22 1100 05/04/22 1121 05/04/22 1130 05/04/22 1139  BP: (!) 101/52  (!) 79/51 (!) 85/52  Pulse: (!) 113     Resp: (!) 21  19 (!) 21  Temp:  (!) 97.5 F (36.4 C)    TempSrc:  Oral    SpO2: 98%     Weight:      Height:        PHYSICAL EXAM General: elderly caucasian female, sitting up in bed, in a deep sleep but awakens to sternal rub.  HEENT:  Normocephalic and atraumatic. Neck:  No JVD.  Lungs: Normal respiratory effort on O2 by [place]. Crackles in left base Heart: tachy irregular irregular . Normal S1 and S2 without gallops or murmurs.  Abdomen: Non-distended appearing.  Msk: Normal strength and tone for age. Extremities: Warm and well perfused. No clubbing, cyanosis. Trace bilateral LE edema.  Neuro: somnolent, awakens to sternal rub and falls asleep during interview Psych:  unable to assess   Labs: Basic Metabolic Panel: Recent Labs    04/30/2022 0329 05/04/22 0418  NA 134* 137  K 4.9 4.6  CL 102 104  CO2 24 23  GLUCOSE 140* 145*  BUN 42* 35*  CREATININE 1.23* 1.19*  CALCIUM 9.0 8.7*  MG  --  2.5*   Liver Function Tests: Recent Labs    05/02/2022 0329  AST 58*  ALT 55*  ALKPHOS 312*  BILITOT 0.6  PROT 7.6  ALBUMIN 3.4*   Recent Labs    05/14/2022 0329  LIPASE 26   CBC: Recent Labs    05/13/2022 0329 05/04/22 0418  WBC 13.6* 13.6*  NEUTROABS 9.9*  --   HGB 10.5* 10.1*  HCT 32.7* 31.4*  MCV 90.8 91.5  PLT 455* 419*   Cardiac Enzymes: Recent Labs    04/24/2022 0329 04/23/2022 0537  TROPONINIHS 6 5   BNP: Recent Labs    04/27/2022 0329  BNP 716.4*   D-Dimer: No results for input(s): "DDIMER" in the last 72 hours. Hemoglobin A1C: No results for input(s): "HGBA1C" in the last  72 hours. Fasting Lipid Panel: No results for input(s): "CHOL", "HDL", "LDLCALC", "TRIG", "CHOLHDL", "LDLDIRECT" in the last 72 hours. Thyroid Function Tests: Recent Labs    05/12/2022 0329  TSH 1.501    Anemia Panel: No results for input(s): "VITAMINB12", "FOLATE", "FERRITIN", "TIBC", "IRON", "RETICCTPCT" in the last 72 hours.   Radiology: [facility] Chest Port 1 View  Result Date: 04/21/2022 CLINICAL DATA:  Chest pain EXAM: PORTABLE CHEST 1 VIEW COMPARISON:  04/20/2022 FINDINGS: Mild bilateral lower lobe opacities, left greater than right, atelectasis versus pneumonia. Small left pleural effusion, new. No frank interstitial edema. Cardiomegaly. Thoracolumbar spine fixation hardware. Cervical spine fixation hardware, incompletely visualized. IMPRESSION: Mild bilateral lower lobe opacities, left greater than right, atelectasis versus pneumonia. Small left pleural effusion. Electronically Signed   By: Charline Bills M.D.   On: 04/19/2022 03:37   DG Chest 2 View  Result Date: 04/22/2022 CLINICAL DATA:  77 year old female, preoperative evaluation. EXAM: CHEST - 2 VIEW COMPARISON:  10/13/2013, 01/30/2022 FINDINGS: The mediastinal contours are within normal limits. No cardiomegaly. The lungs are clear bilaterally without evidence of focal consolidation, pleural effusion, or pneumothorax. Multilevel, incompletely visualized thoracolumbar fusion hardware in place. Lower cervical ACDF hardware in place. No acute osseous abnormality. IMPRESSION: No acute cardiopulmonary process. Electronically Signed   By: Marliss Coots M.D.   On: 04/22/2022 11:34   EP STUDY  Result Date: 04/20/2022 CONCLUSIONS: 1.Successful implantation of a WATCHMAN left atrial appendage occlusive device   2. TEE demonstrating no LAA thrombus 3. No early apparent complications. Post Implant Anticoagulation Strategy: Continue Eliquis 5mg  PO BID x 45 days after implant. After 45 days, stop Eliquis and start Plavix 75mg  PO daily to complete 6 months of post implant therapy. Plan for CT scan 60 days after implant to assess Watchman position.   ECHO TEE  Result Date: 04/20/2022    TRANSESOPHOGEAL ECHO REPORT   Patient Name:   LUVERNA JOHANNES Date of  Exam: 04/20/2022 Medical Rec #:  [id number]      Height:       62.0 in Accession #:    [id number]     Weight:       145.0 lb Date of Birth:  07/19/1945       BSA:          1.667 m Patient Age:    77 years       BP:           158/56 mmHg Patient Gender: F              HR:           76 bpm. Exam Location:  Inpatient Procedure: Transesophageal Echo, 3D Echo, Color Doppler and Cardiac Doppler Indications:     I48.2 Chronic atrial fibrillation  History:         Patient has prior history of Echocardiogram examinations, most                  recent 09/20/2021. Arrythmias:Atrial Fibrillation; Risk                  Factors:Hypertension and Dyslipidemia.  Sonographer:     Irving Burton Senior RDCS Referring Phys:  [id number] Lanier Prude Diagnosing Phys: Riley Lam MD  Sonographer Comments: 44mm Watchman FLX Implanted PROCEDURE: After discussion of the risks and benefits of a TEE, an informed consent was obtained from the patient. The transesophogeal probe was passed without difficulty through the esophogus of the patient. Sedation performed by different physician. The patient  was monitored while under deep sedation. The patient developed no complications during the procedure.  IMPRESSIONS  1. Prior to procedure, patent left atrial appendage. Maximal diameter 2.19 cm (ostial measurements). Sufficient sizing for a 27 mm Watchman FLX device.  2. A mid-posterior transeptal puncture was performed.  3. a 27 mm Watchman FLX was deployed. Greater than 1/2 shoulder seen. Small tissue compression seen at the anterior mitral valve annulus. No thrombus seen. Device recaptured.  4. At redeployment a 27 mm Watchman FLX was present. No peri-device leak. No thrombus. A 1/3-1/2 mitral shoulder was noted in the 135 view. Average compression ~ 15%.  5. No significant pericardial effusion seen throughout study.  6. Iatrogenic ASD present. Majority of flow was left to right, rare right to left shunting.  7. Left ventricular ejection fraction, by  estimation, is 55 to 60%. The left ventricle has normal function.  8. Right ventricular systolic function is normal. The right ventricular size is normal.  9. Left atrial size was moderately dilated. No left atrial/left atrial appendage thrombus was detected. 10. Right atrial size was mildly dilated. 11. The mitral valve is normal in structure. Mild mitral valve regurgitation. 12. The aortic valve is tricuspid. Aortic valve regurgitation is not visualized. No aortic stenosis is present. 13. Evidence of atrial level shunting detected by color flow Doppler. FINDINGS  Left Ventricle: Left ventricular ejection fraction, by estimation, is 55 to 60%. The left ventricle has normal function. The left ventricular internal cavity size was normal in size. Right Ventricle: The right ventricular size is normal. Right vetricular wall thickness was not well visualized. Right ventricular systolic function is normal. Left Atrium: Left atrial size was moderately dilated. No left atrial/left atrial appendage thrombus was detected. Right Atrium: Right atrial size was mildly dilated. Pericardium: There is no evidence of pericardial effusion. Mitral Valve: The mitral valve is normal in structure. Mild mitral valve regurgitation. Tricuspid Valve: The tricuspid valve is normal in structure. Tricuspid valve regurgitation is trivial. No evidence of tricuspid stenosis. Aortic Valve: The aortic valve is tricuspid. Aortic valve regurgitation is not visualized. No aortic stenosis is present. Pulmonic Valve: The pulmonic valve was normal in structure. Pulmonic valve regurgitation is not visualized. No evidence of pulmonic stenosis. Aorta: The aortic root, ascending aorta, aortic arch and descending aorta are all structurally normal, with no evidence of dilitation or obstruction. IAS/Shunts: Evidence of atrial level shunting detected by color flow Doppler. Additional Comments: Spectral Doppler performed. Riley Lam MD Electronically  signed by Riley Lam MD Signature Date/Time: 04/20/2022/10:50:03 AM    Final     ECHO - TEE A04/04/2022  1. Prior to procedure, patent left atrial appendage. Maximal diameter  2.19 cm (ostial measurements). Sufficient sizing for a 27 mm Watchman FLX  device.   2. A mid-posterior transeptal puncture was performed.   3. a 27 mm Watchman FLX was deployed. Greater than 1/2 shoulder seen.  Small tissue compression seen at the anterior mitral valve annulus. No  thrombus seen. Device recaptured.   4. At redeployment a 27 mm Watchman FLX was present. No peri-device leak.  No thrombus. A 1/3-1/2 mitral shoulder was noted in the 135 view. Average  compression ~ 15%.   5. No significant pericardial effusion seen throughout study.   6. Iatrogenic ASD present. Majority of flow was left to right, rare right  to left shunting.   7. Left ventricular ejection fraction, by estimation, is 55 to 60%. The  left ventricle has normal function.   8. Right  ventricular systolic function is normal. The right ventricular  size is normal.   9. Left atrial size was moderately dilated. No left atrial/left atrial  appendage thrombus was detected.  10. Right atrial size was mildly dilated.  11. The mitral valve is normal in structure. Mild mitral valve  regurgitation.  12. The aortic valve is tricuspid. Aortic valve regurgitation is not  visualized. No aortic stenosis is present.  13. Evidence of atrial level shunting detected by color flow Doppler.    TELEMETRY reviewed by me (LT) 05/04/2022 : AF rates 90s- 110s with increase to 120s-140s this afternoon   EKG reviewed by me: NSR 74 nonspecific TW abnormality   Data reviewed by me (LT) 05/04/2022: EP note, ed note, admission H&P, hospitalist progress note, nursing notes last 24h vitals tele labs imaging I/O    Principal Problem:   CAP (community acquired pneumonia) Active Problems:   HLD (hyperlipidemia)   Chronic atrial fibrillation with RVR   HTN  (hypertension)   Chronic diastolic CHF (congestive heart failure)   Chronic kidney disease, stage 3a   Chronic pain   Asthma   Normocytic anemia   Sepsis    ASSESSMENT AND PLAN:  Reda Citron is a 79yoF with a PMH of paroxysmal AF s/p recent Watchman (LAAO closure) placement 4/4, HFpEF, right CEA (09/2021), s/p spinal fusion who presented to [facility] ED 05/14/2022 with cough, shortness of breath, and chest pain for 2 days. Initial concern for CAP, but symptoms ultimately felt to be more related to acute on chronic HFpEF and decompensation from atrial fibrillation with RVR.  Cardiology is consulted for further assistance.  # paroxysmal AF RVR # s/p Watchman LAAO closure device 04/20/2022 Presents with shortness of breath and chest pain x 2 days, in AF RVR with difficult to control HR. Very symptomatic with anxiety. Earlier this AM was in AF with rates primarily in the 90s-low 110s, back up to 120s-140s this afternoon. - stop diltiazem infusion with borderline low BP - change to PO diltiazem 30mg  q6h - change coreg to metoprolol tartrate 12.5mg  PO q6h - start IV amiodarone bolus and continuous infusion for rhythm control - continue eliquis 5mg  BID x 45 days following Watchman as per EP  - check baseline LFTs tomorrow AM - TSH 1.5, free T4 1.12 - careful dosing of anxiolytics as patient became very sedated on valium   # acute on chronic HFpEF  BNP elevated at 700. Clinically somewhat volume up with trace peripheral edema. Cxr with small pleural effusion.  -likely diurese with IV lasix tomorrow AM   This patient's plan of care was discussed and created with Dr. Darrold Junker and he is in agreement.  Signed: Rebeca Allegra , PA-C 05/04/2022, 12:05 PM [facility]

## 2022-05-04 NOTE — ED Notes (Signed)
ED TO INPATIENT HANDOFF REPORT  ED Nurse Name and Phone #: Jen Mow [phone]  S Name/Age/Gender Molly Mitchell 77 y.o. female Room/Bed: ED34A/ED34A  Code Status   Code Status: Full Code  Home/SNF/Other Home Patient oriented to: self, place, time, and situation Is this baseline? Yes   Triage Complete: Triage complete  Chief Complaint Rapid atrial fibrillation [I48.91] CAP (community acquired pneumonia) [J18.9]  Triage Note Pt to triage via w/c with no distress noted; pt reports upper CP accomp by dizziness, SHOB; st hx afib   Allergies Allergies  Allergen Reactions   Amitriptyline Hcl Swelling    Facial swelling   Duricef [Cefadroxil] Swelling    Facial swelling   Levofloxacin Other (See Comments)    Severe Shoulder Pain   Tetanus Toxoid Swelling    Arm swelling   Tramadol Itching   Erythromycin Rash and Other (See Comments)    Rash on bottom    Level of Care/Admitting Diagnosis ED Disposition     ED Disposition  Admit   Condition  --   Comment  Hospital Area: [facility] [[id number]]  Level of Care: Progressive [102]  Admit to Progressive based on following criteria: CARDIOVASCULAR & THORACIC of moderate stability with acute coronary syndrome symptoms/low risk myocardial infarction/hypertensive urgency/arrhythmias/heart failure potentially compromising stability and stable post cardiovascular intervention patients.  Covid Evaluation: Confirmed COVID Negative  Diagnosis: CAP (community acquired pneumonia) [[id number]]  Admitting Physician: Lorretta Harp [[id number]]  Attending Physician: Lorretta Harp [phone]  Certification:: I certify this patient will need inpatient services for at least 2 midnights          B Medical/Surgery History Past Medical History:  Diagnosis Date   Acute right MCA stroke 08/01/2021   a.) noted on MRI head 08/01/2021 --> punctate acute RIGHT MCA territory infarcts affecting the frontal and parietal contex consistent with  microembolic infarcts in the RIGHT carotid circulation   Anemia    Arthritis    Bilateral carotid artery disease 08/18/2021   a.) CTA neck 08/18/2021: 70-80% RICA and 40% LICA   Cervicalgia    Chronic kidney disease    Chronic pain syndrome    Chronic, continuous use of opioids    a.) hydrocodone/APAP (Norco 10/325 mg)   Connective tissue disease    pt reports being diagnosed with undifferentiated connective tissue disease at some point- pt took steroids for this and has had no issues since- 10 years ago   Coronary artery disease    Cortical senile cataract    COVID 2021   DDD (degenerative disc disease), cervical    a.) s/p C4-C6 ACDF   DDD (degenerative disc disease), lumbar    a.) s/p laminectomy + posterior L2-S1 fusion   Dysrhythmia    A-fib   History of 2019 novel coronavirus disease (COVID-19) 12/07/2018   History of blood transfusion    History of heart murmur in childhood    History of rheumatic fever    HLD (hyperlipidemia)    Hypertension    Lower extremity weakness    a.) with position changes from sitting to standing   Osteoporosis    Presence of Watchman left atrial appendage closure device 04/20/2022   27mm Watchman FLX placed by Dr. Lalla Brothers   Pseudogout    Shingles    Skin cancer of face    Spondylolisthesis of lumbar region    Past Surgical History:  Procedure Laterality Date   ABDOMINAL HYSTERECTOMY  1981   AIKEN OSTEOTOMY Right 01/30/2018   Procedure: Gerhard Munch  RIGHT 2ND AND 3RD;  Surgeon: Gwyneth Revels, DPM;  Location: [facility];  Service: Podiatry;  Laterality: Right;   ANTERIOR CERVICAL DECOMP/DISCECTOMY FUSION N/A 2003   C4-C6   BACK SURGERY     pt reports hx of 3 lumber back surgeries total   CERVICAL SPINE SURGERY     pt reports having 3 cervical fusions total   ENDARTERECTOMY Right 09/21/2021   Procedure: ENDARTERECTOMY CAROTID;  Surgeon: Renford Dills, MD;  Location: [facility];  Service: Vascular;  Laterality: Right;  Right carotid    HAMMER TOE SURGERY Right 01/30/2018   Procedure: HAMMER TOE CORRECTION SECOND AND THIRD;  Surgeon: Gwyneth Revels, DPM;  Location: [facility];  Service: Podiatry;  Laterality: Right;  GENERAL WITH LOCAL   HAMMER TOE SURGERY Left 03/05/2019   Procedure: HAMMER TOE CORRECTION T1 AND T2;  Surgeon: Gwyneth Revels, DPM;  Location: [facility];  Service: Podiatry;  Laterality: Left;   LEFT ATRIAL APPENDAGE OCCLUSION N/A 04/20/2022   Procedure: LEFT ATRIAL APPENDAGE OCCLUSION;  Surgeon: Lanier Prude, MD;  Location: [facility];  Service: Cardiovascular;  Laterality: N/A;   POSTERIOR LUMBAR FUSION N/A 2001   L2-S1   ROTATOR CUFF REPAIR Right 1981   SACROPLASTY N/A 03/29/2017   S1   TEE WITHOUT CARDIOVERSION N/A 04/20/2022   Procedure: TRANSESOPHAGEAL ECHOCARDIOGRAM;  Surgeon: Lanier Prude, MD;  Location: [facility];  Service: Cardiovascular;  Laterality: N/A;   TOTAL KNEE ARTHROPLASTY Left 03/03/2013   TOTAL KNEE REVISION Left 07/31/2016   Procedure: TOTAL KNEE REVISION;  Surgeon: Donato Heinz, MD;  Location: [facility];  Service: Orthopedics;  Laterality: Left;   WEIL OSTEOTOMY Left 03/05/2019   Procedure: WEIL OSTEOTOMY X 2 LEFT;  Surgeon: Gwyneth Revels, DPM;  Location: [facility];  Service: Podiatry;  Laterality: Left;     A IV Location/Drains/Wounds Patient Lines/Drains/Airways Status     Active Line/Drains/Airways     Name Placement date Placement time Site Days   Peripheral IV 05/12/2022 20 G Distal;Posterior;Right Forearm 05/08/2022  0335  Forearm  1   Peripheral IV 05/04/22 20 G 1.88" Anterior;Distal;Left;Upper Arm 05/04/22  1041  Arm  less than 1            Intake/Output Last 24 hours No intake or output data in the 24 hours ending 05/04/22 1249  Labs/Imaging Results for orders placed or performed during the hospital encounter of 05/14/2022 (from the past 48 hour(s))  CBC with Differential     Status: Abnormal   Collection  Time: 05/12/2022  3:29 AM  Result Value Ref Range   WBC 13.6 (H) 4.0 - 10.5 K/uL   RBC 3.60 (L) 3.87 - 5.11 MIL/uL   Hemoglobin 10.5 (L) 12.0 - 15.0 g/dL   HCT 16.1 (L) 09.6 - 04.5 %   MCV 90.8 80.0 - 100.0 fL   MCH 29.2 26.0 - 34.0 pg   MCHC 32.1 30.0 - 36.0 g/dL   RDW 40.9 81.1 - 91.4 %   Platelets 455 (H) 150 - 400 K/uL   nRBC 0.0 0.0 - 0.2 %   Neutrophils Relative % 72 %   Neutro Abs 9.9 (H) 1.7 - 7.7 K/uL   Lymphocytes Relative 16 %   Lymphs Abs 2.1 0.7 - 4.0 K/uL   Monocytes Relative 10 %   Monocytes Absolute 1.3 (H) 0.1 - 1.0 K/uL   Eosinophils Relative 1 %   Eosinophils Absolute 0.2 0.0 - 0.5 K/uL   Basophils Relative  0 %   Basophils Absolute 0.1 0.0 - 0.1 K/uL   Immature Granulocytes 1 %   Abs Immature Granulocytes 0.07 0.00 - 0.07 K/uL    Comment: Performed at [facility], [address] [place], [place] [address]  Comprehensive metabolic panel     Status: Abnormal   Collection Time: 04/28/2022  3:29 AM  Result Value Ref Range   Sodium 134 (L) 135 - 145 mmol/L   Potassium 4.9 3.5 - 5.1 mmol/L   Chloride 102 98 - 111 mmol/L   CO2 24 22 - 32 mmol/L   Glucose, Bld 140 (H) 70 - 99 mg/dL    Comment: Glucose reference range applies only to samples taken after fasting for at least 8 hours.   BUN 42 (H) 8 - 23 mg/dL   Creatinine, Ser 4.01 (H) 0.44 - 1.00 mg/dL   Calcium 9.0 8.9 - 02.7 mg/dL   Total Protein 7.6 6.5 - 8.1 g/dL   Albumin 3.4 (L) 3.5 - 5.0 g/dL   AST 58 (H) 15 - 41 U/L   ALT 55 (H) 0 - 44 U/L   Alkaline Phosphatase 312 (H) 38 - 126 U/L   Total Bilirubin 0.6 0.3 - 1.2 mg/dL   GFR, Estimated 46 (L) >60 mL/min    Comment: (NOTE) Calculated using the CKD-EPI Creatinine Equation (2021)    Anion gap 8 5 - 15    Comment: Performed at [facility], [address] [place], [place] [address]  Troponin I (High Sensitivity)     Status: None   Collection Time: 04/21/2022  3:29 AM  Result Value Ref Range   Troponin I (High Sensitivity) 6 <18 ng/L     Comment: (NOTE) Elevated high sensitivity troponin I (hsTnI) values and significant  changes across serial measurements may suggest ACS but many other  chronic and acute conditions are known to elevate hsTnI results.  Refer to the "Links" section for chest pain algorithms and additional  guidance. Performed at [facility], [address] [place], [place] [address]   Lipase, blood     Status: None   Collection Time: 04/30/2022  3:29 AM  Result Value Ref Range   Lipase 26 11 - 51 U/L    Comment: Performed at [facility], [address] [place], [place] [address]  TSH     Status: None   Collection Time: 04/18/2022  3:29 AM  Result Value Ref Range   TSH 1.501 0.350 - 4.500 uIU/mL    Comment: Performed by a 3rd Generation assay with a functional sensitivity of <=0.01 uIU/mL. Performed at [facility], [address] [place], [place] [address]   T4, free     Status: None   Collection Time: 05/01/2022  3:29 AM  Result Value Ref Range   Free T4 1.12 0.61 - 1.12 ng/dL    Comment: (NOTE) Biotin ingestion may interfere with free T4 tests. If the results are inconsistent with the TSH level, previous test results, or the clinical presentation, then consider biotin interference. If needed, order repeat testing after stopping biotin. Performed at [facility], [address] [place], [place] [address]   Brain natriuretic peptide     Status: Abnormal   Collection Time: 05/16/2022  3:29 AM  Result Value Ref Range   B Natriuretic Peptide 716.4 (H) 0.0 - 100.0 pg/mL    Comment: Performed at [facility], [address], [place], [place] [address]  Troponin I (High Sensitivity)  Status: None   Collection Time: 05/08/2022  5:37 AM  Result Value Ref Range   Troponin I (High Sensitivity) 5 <18 ng/L    Comment: (NOTE) Elevated high sensitivity troponin I (hsTnI) values and significant  changes across serial measurements may suggest ACS but many  other  chronic and acute conditions are known to elevate hsTnI results.  Refer to the "Links" section for chest pain algorithms and additional  guidance. Performed at [facility], [address] [place], [place] [address]   Lactic acid, plasma     Status: None   Collection Time: 04/29/2022  5:37 AM  Result Value Ref Range   Lactic Acid, Venous 1.3 0.5 - 1.9 mmol/L    Comment: Performed at [facility], [address] [place], [place] [address]  Procalcitonin     Status: None   Collection Time: 05/14/2022  5:37 AM  Result Value Ref Range   Procalcitonin 0.19 ng/mL    Comment:        Interpretation: PCT (Procalcitonin) <= 0.5 ng/mL: Systemic infection (sepsis) is not likely. Local bacterial infection is possible. (NOTE)       Sepsis PCT Algorithm           Lower Respiratory Tract                                      Infection PCT Algorithm    ----------------------------     ----------------------------         PCT < 0.25 ng/mL                PCT < 0.10 ng/mL          Strongly encourage             Strongly discourage   discontinuation of antibiotics    initiation of antibiotics    ----------------------------     -----------------------------       PCT 0.25 - 0.50 ng/mL            PCT 0.10 - 0.25 ng/mL               OR       >80% decrease in PCT            Discourage initiation of                                            antibiotics      Encourage discontinuation           of antibiotics    ----------------------------     -----------------------------         PCT >= 0.50 ng/mL              PCT 0.26 - 0.50 ng/mL               AND        <80% decrease in PCT             Encourage initiation of                                             antibiotics       Encourage  continuation           of antibiotics    ----------------------------     -----------------------------        PCT >= 0.50 ng/mL                  PCT > 0.50 ng/mL               AND          increase in PCT                  Strongly encourage                                      initiation of antibiotics    Strongly encourage escalation           of antibiotics                                     -----------------------------                                           PCT <= 0.25 ng/mL                                                 OR                                        > 80% decrease in PCT                                      Discontinue / Do not initiate                                             antibiotics  Performed at [facility], [address] [place], [place] [address]   Resp panel by RT-PCR (RSV, Flu A&B, Covid) Anterior Nasal Swab     Status: None   Collection Time: 04/25/2022  5:37 AM   Specimen: Anterior Nasal Swab  Result Value Ref Range   SARS Coronavirus 2 by RT PCR NEGATIVE NEGATIVE    Comment: (NOTE) SARS-CoV-2 target nucleic acids are NOT DETECTED.  The SARS-CoV-2 RNA is generally detectable in upper respiratory specimens during the acute phase of infection. The lowest concentration of SARS-CoV-2 viral copies this assay can detect is 138 copies/mL. A negative result does not preclude SARS-Cov-2 infection and should not be used as the sole basis for treatment or other patient management decisions. A negative result may occur with  improper specimen collection/handling, submission of specimen other than nasopharyngeal swab, presence of viral mutation(s) within the areas targeted by this assay, and inadequate number of viral copies(<138 copies/mL). A negative result must be combined with clinical observations, patient history, and epidemiological information. The expected  result is Negative.  Fact Sheet for Patients:  BloggerCourse.com  Fact Sheet for Healthcare Providers:  SeriousBroker.it  This test is no t yet approved or cleared by the [place] FDA and  has been authorized  for detection and/or diagnosis of SARS-CoV-2 by FDA under an Emergency Use Authorization (EUA). This EUA will remain  in effect (meaning this test can be used) for the duration of the COVID-19 declaration under Section 564(b)(1) of the Act, 21 U.S.C.section 360bbb-3(b)(1), unless the authorization is terminated  or revoked sooner.       Influenza A by PCR NEGATIVE NEGATIVE   Influenza B by PCR NEGATIVE NEGATIVE    Comment: (NOTE) The Xpert Xpress SARS-CoV-2/FLU/RSV plus assay is intended as an aid in the diagnosis of influenza from Nasopharyngeal swab specimens and should not be used as a sole basis for treatment. Nasal washings and aspirates are unacceptable for Xpert Xpress SARS-CoV-2/FLU/RSV testing.  Fact Sheet for Patients: BloggerCourse.com  Fact Sheet for Healthcare Providers: SeriousBroker.it  This test is not yet approved or cleared by the [place] FDA and has been authorized for detection and/or diagnosis of SARS-CoV-2 by FDA under an Emergency Use Authorization (EUA). This EUA will remain in effect (meaning this test can be used) for the duration of the COVID-19 declaration under Section 564(b)(1) of the Act, 21 U.S.C. section 360bbb-3(b)(1), unless the authorization is terminated or revoked.     Resp Syncytial Virus by PCR NEGATIVE NEGATIVE    Comment: (NOTE) Fact Sheet for Patients: BloggerCourse.com  Fact Sheet for Healthcare Providers: SeriousBroker.it  This test is not yet approved or cleared by the [place] FDA and has been authorized for detection and/or diagnosis of SARS-CoV-2 by FDA under an Emergency Use Authorization (EUA). This EUA will remain in effect (meaning this test can be used) for the duration of the COVID-19 declaration under Section 564(b)(1) of the Act, 21 U.S.C. section 360bbb-3(b)(1), unless the authorization is terminated  or revoked.  Performed at [facility], [address] [place], [place] [address]   Urinalysis, Routine w reflex microscopic -Urine, Clean Catch     Status: Abnormal   Collection Time: 05/14/2022  8:11 AM  Result Value Ref Range   Color, Urine YELLOW (A) YELLOW   APPearance CLOUDY (A) CLEAR   Specific Gravity, Urine 1.018 1.005 - 1.030   pH 5.0 5.0 - 8.0   Glucose, UA NEGATIVE NEGATIVE mg/dL   Hgb urine dipstick NEGATIVE NEGATIVE   Bilirubin Urine NEGATIVE NEGATIVE   Ketones, ur NEGATIVE NEGATIVE mg/dL   Protein, ur 30 (A) NEGATIVE mg/dL   Nitrite NEGATIVE NEGATIVE   Leukocytes,Ua SMALL (A) NEGATIVE   RBC / HPF 0-5 0 - 5 RBC/hpf   WBC, UA 0-5 0 - 5 WBC/hpf   Bacteria, UA NONE SEEN NONE SEEN   Squamous Epithelial / HPF 21-50 0 - 5 /HPF   Mucus PRESENT    Hyaline Casts, UA PRESENT     Comment: Performed at [facility], [address] [place], [place] [address]  Strep pneumoniae urinary antigen     Status: None   Collection Time: 05/09/2022  8:11 AM  Result Value Ref Range   Strep Pneumo Urinary Antigen NEGATIVE NEGATIVE    Comment:        Infection due to S. pneumoniae cannot be absolutely ruled out since the antigen present may be below the detection limit of the test. Performed at [facility], [address] [place], [place] [address]  Basic metabolic panel     Status: Abnormal   Collection Time: 05/04/22  4:18 AM  Result Value Ref Range   Sodium 137 135 - 145 mmol/L   Potassium 4.6 3.5 - 5.1 mmol/L   Chloride 104 98 - 111 mmol/L   CO2 23 22 - 32 mmol/L   Glucose, Bld 145 (H) 70 - 99 mg/dL    Comment: Glucose reference range applies only to samples taken after fasting for at least 8 hours.   BUN 35 (H) 8 - 23 mg/dL   Creatinine, Ser 1.61 (H) 0.44 - 1.00 mg/dL   Calcium 8.7 (L) 8.9 - 10.3 mg/dL   GFR, Estimated 47 (L) >60 mL/min    Comment: (NOTE) Calculated using the CKD-EPI Creatinine Equation (2021)    Anion gap 10 5 - 15     Comment: Performed at [facility], [address] [place], [place] [address]  CBC     Status: Abnormal   Collection Time: 05/04/22  4:18 AM  Result Value Ref Range   WBC 13.6 (H) 4.0 - 10.5 K/uL   RBC 3.43 (L) 3.87 - 5.11 MIL/uL   Hemoglobin 10.1 (L) 12.0 - 15.0 g/dL   HCT 54.0 (L) 98.1 - 19.1 %   MCV 91.5 80.0 - 100.0 fL   MCH 29.4 26.0 - 34.0 pg   MCHC 32.2 30.0 - 36.0 g/dL   RDW 47.8 29.5 - 62.1 %   Platelets 419 (H) 150 - 400 K/uL   nRBC 0.0 0.0 - 0.2 %    Comment: Performed at [facility], [address] [place], [place] [address]  Magnesium     Status: Abnormal   Collection Time: 05/04/22  4:18 AM  Result Value Ref Range   Magnesium 2.5 (H) 1.7 - 2.4 mg/dL    Comment: Performed at [facility], [address] [place], [place] [address]   DG Chest Port 1 View  Result Date: 05/13/2022 CLINICAL DATA:  Chest pain EXAM: PORTABLE CHEST 1 VIEW COMPARISON:  04/20/2022 FINDINGS: Mild bilateral lower lobe opacities, left greater than right, atelectasis versus pneumonia. Small left pleural effusion, new. No frank interstitial edema. Cardiomegaly. Thoracolumbar spine fixation hardware. Cervical spine fixation hardware, incompletely visualized. IMPRESSION: Mild bilateral lower lobe opacities, left greater than right, atelectasis versus pneumonia. Small left pleural effusion. Electronically Signed   By: Charline Bills M.D.   On: 04/28/2022 03:37    Pending Labs Unresulted Labs (From admission, onward)     Start     Ordered   05/04/22 0030  Culture, blood (routine x 2)  BLOOD CULTURE X 2,   STAT        05/04/22 0001   05/13/2022 0841  Expectorated Sputum Assessment w Gram Stain, Rflx to Resp Cult  (COPD / Pneumonia / Cellulitis / Lower Extremity Wound)  Once,   R        05/16/2022 0843   05/12/2022 0841  Legionella Pneumophila Serogp 1 Ur Ag  (COPD / Pneumonia / Cellulitis / Lower Extremity Wound)  Once,   R        05/01/2022 0843   04/26/2022 0449  Culture,  blood (routine x 2)  BLOOD CULTURE X 2,   STAT,   Status:  Canceled      04/29/2022 0448            Vitals/Pain Today's Vitals   05/04/22 1121 05/04/22 1130 05/04/22 1139 05/04/22 1230  BP:  (!) 79/51 (!) 85/52 99/71  Pulse:    Marland Kitchen)  116  Resp:  19 (!) 21 (!) 22  Temp: (!) 97.5 F (36.4 C)     TempSrc: Oral     SpO2:    97%  Weight:      Height:      PainSc:        Isolation Precautions No active isolations  Medications Medications  HYDROcodone-acetaminophen (NORCO/VICODIN) 5-325 MG per tablet 2 tablet (2 tablets Oral Given 05/02/2022 1944)  albuterol (PROVENTIL) (2.5 MG/3ML) 0.083% nebulizer solution 3 mL (has no administration in time range)  dextromethorphan-guaiFENesin (MUCINEX DM) 30-600 MG per 12 hr tablet 1 tablet (has no administration in time range)  ondansetron (ZOFRAN) injection 4 mg (has no administration in time range)  acetaminophen (TYLENOL) tablet 650 mg (has no administration in time range)  hydrALAZINE (APRESOLINE) injection 5 mg (has no administration in time range)  pravastatin (PRAVACHOL) tablet 40 mg (40 mg Oral Given 04/27/2022 2104)  apixaban (ELIQUIS) tablet 5 mg (5 mg Oral Given 05/04/22 1035)  tiZANidine (ZANAFLEX) tablet 4 mg (4 mg Oral Given 05/04/22 1035)  cefTRIAXone (ROCEPHIN) 2 g in sodium chloride 0.9 % 100 mL IVPB (0 g Intravenous Stopped 05/04/22 1202)  doxycycline (VIBRA-TABS) tablet 100 mg (100 mg Oral Given 05/04/22 1035)  metoprolol tartrate (LOPRESSOR) tablet 12.5 mg (has no administration in time range)  diltiazem (CARDIZEM) tablet 30 mg (has no administration in time range)  diltiazem (CARDIZEM) injection 10 mg (10 mg Intravenous Given 05/16/2022 0339)  doxycycline (VIBRAMYCIN) 100 mg in sodium chloride 0.9 % 250 mL IVPB (0 mg Intravenous Stopped 05/01/2022 0810)  sodium chloride 0.9 % bolus 500 mL (0 mLs Intravenous Stopped 04/18/2022 0735)  LORazepam (ATIVAN) injection 0.5 mg (0.5 mg Intravenous Given 04/17/2022 0630)  LORazepam (ATIVAN) injection 0.5  mg (0.5 mg Intravenous Given 05/10/2022 0735)  clonazepam (KLONOPIN) disintegrating tablet 0.125 mg (0.125 mg Oral Given 05/04/22 5784)  furosemide (LASIX) injection 40 mg (40 mg Intravenous Given 05/04/22 1041)    Mobility walks     Focused Assessments Cardiac Assessment Handoff:  Cardiac Rhythm: Atrial fibrillation No results found for: "CKTOTAL", "CKMB", "CKMBINDEX", "TROPONINI" No results found for: "DDIMER" Does the Patient currently have chest pain? No    R Recommendations: See Admitting Provider Note  Report given to:   Additional Notes: Diltiazem stopped d/t hypotension. Awaiting better BP for PO medications

## 2022-05-04 NOTE — ED Notes (Signed)
Pt appears very sleepy after Valium administration. PA at bedside and able to see pt. While sleeping she was found to have BP 70's/50's. I entered pt room and rechecked and found her to be 85/52. Diltiazem drip stopped and PA Lily T. Notified.

## 2022-05-04 NOTE — Hospital Course (Addendum)
Molly Mitchell is a 77 y.o. female with medical history significant of A-fib on Eliquis, s/p of Watchman left atrial appendage closure device placement on 04/20/23, hypertension, hyperlipidemia, asthma, diastolic CHF, stroke, CKD-3A, chronic pain, carotid artery stenosis, who presents to the ED from home 05/03/2022 with cough, shortness of breath, chest pain x2 days. Patient recently had Watchman left atrial appendage closure device placement on 04/20/23.  04/17: atrial fibrillation with RVR, heart rate up to 137. Required cardizem drip in ED. Negative PCR for COVID, flu and RSV, WBC 13.7, lactic acid 1.3, procalcitonin 0.19. Urinalysis (cloudy appearance, small amount of leukocyte, negative nitrite, WBC 0-5 with squamous cell contamination 21-50), slightly worsening renal function, BNP 716, trop 6 --> 5. Temperature normal, blood pressure 102/74, RR 25-37, oxygen saturation 97% on room air. Pt is admitted PCU as inpt. Tx for Afib RVR and sepsis d/t CAP.  04/18: significantly anxious, became over sedated w/ valium, low BP, improved in afternoon. Echo reviewed from Watchman device placement - TEE 04/20/22 LVEF 55-60% normal LV and RV fxn, LA mod dilated. Cardiology to adjust rate/rhythm control --> d/c dilt gtt, start po diltiazem, change coreg to metoprolol 12.5 mg po q6h, started amiodarone bolus/gtt. Remained very anxious - alprazolam lower dose administered.  04/19:  converted to sinus rhythm about mid-day. Concern worsening infiltrate on CXR, add azithromycin. 04/20: amio gtt d/c overnight d/t hypotension/bradycardia.CXR personally reviewed - infiltrate worse on L appears more c/w edema/vascular congestion, trial another dose lasix 40 mg IV diuresis. RVR recurred, restarted amiodarone gtt lower dose since had po amio also, BP a bit low so holding metoprolol po for now. Echo ordered.  04/21: Echo reviewed - EF 60-65%, indeterminate diastolic parameters, enlarged RV, moderately dilated LA and RA, small  pericardial effusion w/o tamponade. BP soft. Maintain amio gtt, added midodrine rather than add fluids.  04/22: Overnight HR to 146, given IV metoprolol and converted to sinus but hypotensive, got IV fluids, amio gtt held. Converted to sinus. Somnolent but rousable, reduced/adjusted sedating medications.  04/23: remains in/out Afib RVR. Increased metoprolol frequency. Cardiology hesitant on DCCV - may need repeat imaging to r/o thrombus with recent Watchman placement   Consultants:  Cardiology   Procedures: None       ASSESSMENT & PLAN:   Principal Problem:   CAP (community acquired pneumonia) Active Problems:   Sepsis   Chronic diastolic CHF (congestive heart failure)   Chronic atrial fibrillation with RVR   Asthma   HTN (hypertension)   Chronic pain   HLD (hyperlipidemia)   Chronic kidney disease, stage 3a   Normocytic anemia   Sepsis due to CAP (community acquired pneumonia)  vs SIRS d/t Afib RVR and anxiety  Elevated HR and RR can be either/both etiology Met criteria with WBC 13.6, heart rate up to 137 and RR 25-37.  Lactic acid normal 1.3.  Procalcitonin 0.19.   Slightly worsening renal function on admission with creatinine 1.23, BUN 42, GFR 46 (recent baseline creatinine 1.14 on 03/22/2022).    Urine legionella and S. pneumococcal antigen - neg Treat as below   Community Acquired Pneumonia  IV Rocephin and doxycycline to cover PNA, changed doxy to azithromycin Mucinex for cough  Bronchodilators CXR prn  Chronic atrial fibrillation with acute RVR Amiodarone gtt per cardiology d/c as of 01:00 05/08/22  PO amio  BID for 14 days, then  daily thereafter.  Restarted metoprolol and titrating this  Eliquis 5 mg po bid --> continue eliquis  BID x 45 days following  Watchman as per EP  Cardiology following, appreciate assistance    Acute hypoxic respiratory failure  Supplemental O2 as needed   Chronic diastolic CHF (congestive heart failure), w/  exacerbation  2D echo on 04/20/2022 showed EF of 55-60%.  S/p lasix, which seems to have helped respiratory status somewhat Strict I&O ordered but no significant output has been documented   Severe anxiety Sensitivity to benzodiazepine (oversedation w/ Valium)  Scheduled BuSpar  Nausea Compazine given refractory Consider phenergan qhs prn   Asthma Bronchodilators   HTN (hypertension): IV hydralazine as needed Meds as above    Chronic pain Continue as needed Norco, reduced dose  Scheduled reduced dose tizanidine  As needed Tylenol   HLD (hyperlipidemia) Pravastatin   AKI on Chronic kidney disease, stage 3a:  Monitor BMP   Normocytic anemia:  Hemoglobin 10.5 (12.1 on 03/22/2022), no active bleeding. Follow-up with CBC   DVT prophylaxis: Eliquis Pertinent IV fluids/nutrition: no continuous IV fluids at this time  Central lines / invasive devices: none  Code Status: FULL CODE ACP documents reviewed: none on file   Current Admission Status: inpatient, progressive unit d/t RVR difficult to control TOC needs / Dispo plan: anticipate d/c to previous home environment w/ HH Barriers to discharge / significant pending items: pending clinical improvement expect will be here 1-2 more days / pending cardiology clearance and possible repeat imaging / DCCV

## 2022-05-05 ENCOUNTER — Inpatient Hospital Stay: Payer: Medicare HMO

## 2022-05-05 DIAGNOSIS — J189 Pneumonia, unspecified organism: Secondary | ICD-10-CM | POA: Diagnosis not present

## 2022-05-05 DIAGNOSIS — I10 Essential (primary) hypertension: Secondary | ICD-10-CM | POA: Diagnosis not present

## 2022-05-05 DIAGNOSIS — J4520 Mild intermittent asthma, uncomplicated: Secondary | ICD-10-CM | POA: Diagnosis not present

## 2022-05-05 DIAGNOSIS — A419 Sepsis, unspecified organism: Secondary | ICD-10-CM | POA: Diagnosis not present

## 2022-05-05 LAB — CBC
HCT: 31.7 % — ABNORMAL LOW (ref 36.0–46.0)
Hemoglobin: 10.2 g/dL — ABNORMAL LOW (ref 12.0–15.0)
MCH: 29.1 pg (ref 26.0–34.0)
MCHC: 32.2 g/dL (ref 30.0–36.0)
MCV: 90.6 fL (ref 80.0–100.0)
Platelets: 490 10*3/uL — ABNORMAL HIGH (ref 150–400)
RBC: 3.5 MIL/uL — ABNORMAL LOW (ref 3.87–5.11)
RDW: 15.3 % (ref 11.5–15.5)
WBC: 19.4 10*3/uL — ABNORMAL HIGH (ref 4.0–10.5)
nRBC: 0 % (ref 0.0–0.2)

## 2022-05-05 LAB — COMPREHENSIVE METABOLIC PANEL
ALT: 32 U/L (ref 0–44)
AST: 20 U/L (ref 15–41)
Albumin: 3.3 g/dL — ABNORMAL LOW (ref 3.5–5.0)
Alkaline Phosphatase: 248 U/L — ABNORMAL HIGH (ref 38–126)
Anion gap: 14 (ref 5–15)
BUN: 45 mg/dL — ABNORMAL HIGH (ref 8–23)
CO2: 22 mmol/L (ref 22–32)
Calcium: 8.7 mg/dL — ABNORMAL LOW (ref 8.9–10.3)
Chloride: 101 mmol/L (ref 98–111)
Creatinine, Ser: 1.57 mg/dL — ABNORMAL HIGH (ref 0.44–1.00)
GFR, Estimated: 34 mL/min — ABNORMAL LOW (ref >60)
Glucose, Bld: 137 mg/dL — ABNORMAL HIGH (ref 70–99)
Potassium: 4.5 mmol/L (ref 3.5–5.1)
Sodium: 137 mmol/L (ref 135–145)
Total Bilirubin: 0.4 mg/dL (ref 0.3–1.2)
Total Protein: 7.4 g/dL (ref 6.5–8.1)

## 2022-05-05 LAB — CULTURE, BLOOD (ROUTINE X 2)

## 2022-05-05 LAB — TSH: TSH: 0.677 u[IU]/mL (ref 0.350–4.500)

## 2022-05-05 MED ORDER — AMIODARONE HCL 200 MG PO TABS
200.0000 mg | ORAL_TABLET | Freq: Two times a day (BID) | ORAL | Status: DC
  Administered 2022-05-06: 200 mg via ORAL
  Filled 2022-05-05: qty 1

## 2022-05-05 MED ORDER — METOPROLOL TARTRATE 25 MG PO TABS
25.0000 mg | ORAL_TABLET | Freq: Four times a day (QID) | ORAL | Status: DC
  Administered 2022-05-05 – 2022-05-06 (×4): 25 mg via ORAL
  Filled 2022-05-05 (×4): qty 1

## 2022-05-05 MED ORDER — DILTIAZEM HCL 30 MG PO TABS
60.0000 mg | ORAL_TABLET | Freq: Four times a day (QID) | ORAL | Status: DC
  Administered 2022-05-05 – 2022-05-06 (×3): 60 mg via ORAL
  Filled 2022-05-05 (×3): qty 2

## 2022-05-05 MED ORDER — AMIODARONE HCL 200 MG PO TABS: 200.0000 mg | ORAL_TABLET | Freq: Every day | ORAL | Status: DC

## 2022-05-05 MED ORDER — SODIUM CHLORIDE 0.9 % IV SOLN
500.0000 mg | INTRAVENOUS | Status: DC
  Administered 2022-05-05 – 2022-05-07 (×3): 500 mg via INTRAVENOUS
  Filled 2022-05-05: qty 500
  Filled 2022-05-05: qty 5
  Filled 2022-05-05: qty 500
  Filled 2022-05-05: qty 5

## 2022-05-05 NOTE — TOC Progression Note (Signed)
Transition of Care [facility]) - Progression Note    Patient Details  Name: Molly Mitchell MRN: [id number] Date of Birth: 09-22-1945  [facility] of [facility]) CM/SW Contact  Truddie Hidden, RN Phone Number: 05/05/2022, 2:23 PM  Clinical Narrative:    Case reviewed for DME needs and changes in discharge disposition.         Expected Discharge Plan and Services                                               Social Determinants of Health (SDOH) Interventions SDOH Screenings   Food Insecurity: No Food Insecurity (05/04/2022)  Housing: Low Risk  (05/04/2022)  Transportation Needs: No Transportation Needs (05/04/2022)  Utilities: Not At Risk (05/04/2022)  Alcohol Screen: Low Risk  (06/03/2020)  Depression (PHQ2-9): Low Risk  (06/03/2020)  Financial Resource Strain: Low Risk  (03/23/2020)  Physical Activity: Sufficiently Active (03/23/2020)  Social Connections: Socially Integrated (03/23/2020)  Stress: No Stress Concern Present (03/23/2020)  Tobacco Use: Medium Risk (05/03/2022)    Readmission Risk Interventions     No data to display

## 2022-05-05 NOTE — Progress Notes (Signed)
PROGRESS NOTE    [name]   EAV:[id number] DOB: [birth year]  DOA: 05/10/2022 Date of Service: 05/05/22 PCP: [facility]     Brief Narrative / Hospital Course:  Molly Mitchell is a 77 y.o. female with medical history significant of A-fib on Eliquis, s/p of Watchman left atrial appendage closure device placement on 04/20/23, hypertension, hyperlipidemia, asthma, diastolic CHF, stroke, CKD-3A, chronic pain, carotid artery stenosis, who presents to the ED from home 04/24/2022 with cough, shortness of breath, chest pain x2 days. Patient recently had Watchman left atrial appendage closure device placement on 04/20/23.  04/17: atrial fibrillation with RVR, heart rate up to 137.  Patient was given 10 mg of Cardizem with temporary improved heart rate, but heart rates increased again, and started on cardizem drip in ED. Negative PCR for COVID, flu and RSV, WBC 13.7, lactic acid 1.3, procalcitonin 0.19. Urinalysis (cloudy appearance, small amount of leukocyte, negative nitrite, WBC 0-5 with squamous cell contamination 21-50), slightly worsening renal function, BNP 716, trop 6 --> 5. Temperature normal, blood pressure 102/74, RR 25-37, oxygen saturation 97% on room air. Pt is admitted PCU as inpt. Tx for Afib RVR and sepsis d/t CAP. Hr initially improved to 80s. 04/18: significantly anxious, became over sedated w/ valium, low BP, improved in afternoon. Echo reviewed from Watchman device placement - TEE 04/20/22 LVEF 55-60% normal LV and RV fxn, LA mod dilated. Cardiology to adjust rate/rhythm control --> d/c dilt gtt, start po diltiazem, change coreg to metoprolol 12.5 mg po q6h, started amiodarone bolus/gtt. Remained very anxious - alprazolam lower dose administered.  04/19:  converted to sinus rhythm about mid-day. Concern for worsening infiltrate on CXR, add azithromycin, repeat CXR in AM.  Consultants:  Cardiology   Procedures: None       ASSESSMENT & PLAN:   Principal Problem:   CAP  (community acquired pneumonia) Active Problems:   Sepsis   Chronic diastolic CHF (congestive heart failure)   Chronic atrial fibrillation with RVR   Asthma   HTN (hypertension)   Chronic pain   HLD (hyperlipidemia)   Chronic kidney disease, stage 3a   Normocytic anemia  Sepsis due to CAP (community acquired pneumonia) vs SIRS d/t Afib RVR and anxiety  Elevated HR and RR can be either/both etiology Patient meets criteria with WBC 13.6, heart rate up to 137 and RR 25-37.  Lactic acid normal 1.3.  Procalcitonin 0.19.  Patient has slightly worsening renal function with creatinine 1.23, BUN 42, GFR 46 (recent baseline creatinine 1.14 on 03/22/2022).    IV Rocephin and doxycycline to cover PNA Antianxiety medications as toelrated Trial Lasix - question rales on exam / pulmonary edema on CXR vs PNA Mucinex for cough  Bronchodilators Urine legionella and S. pneumococcal antigen IVF: 500 ml of NS bolus in ED - hold any further fluids at this time  CXR concerning for worsening pneumonia today - added azithromycin  Chronic atrial fibrillation with acute RVR:  Heart rate up to 137 --> 80s --> 130s again Amiodarone gtt per cardiology --> Continue infusion until tomorrow AM, then start PO amio  BID for 14 days, then  daily thereafter.  Metoprolol increased to 25 mg po q6h Diltiazem  po q6h Eliquis 5 mg po bid --> continue eliquis  BID x 45 days following Watchman as per EP  Cardiology following, appreciate assistance    Acute hypoxic respiratory failure  Supplemental O2 as needed   Chronic diastolic CHF (congestive heart failure):  2D echo on 04/20/2022  showed EF of 55-60%.   Patient has elevated BNP 716, but patient does not have any leg edema, no JVD, does not seem to have CHF exacerbation. Watch volume status closely S/p lasix Consider repeat lasix pending BMP and CXR tomorrow  CXR in AM    Severe anxiety Sensitivity to benzodiazepine (oversedation w/ Valium)  Scheduled  BuSpar Alprazolam 0.25 mg tid prn  Asthma Bronchodilators   HTN (hypertension): IV hydralazine as needed Metoprolol and diltiazem as above    Chronic pain Continue as needed Norco, As needed Tylenol   HLD (hyperlipidemia) Pravastatin   AKI on Chronic kidney disease, stage 3a:  Slightly worsening renal function.   Baseline creatinine 1.14 on 03/22/2022 --> 1.23 on admission.  BUN 42, GFR 46 Monitor BMP Consider repeat lasix in AM    Normocytic anemia:  Hemoglobin 10.5 (12.1 on 03/22/2022), no active bleeding. Follow-up with CBC   DVT prophylaxis: Eliquis Pertinent IV fluids/nutrition: no continuous IV fluids at this time  Central lines / invasive devices: none  Code Status: FULL CODE ACP documents reviewed: none on file   Current Admission Status: inpatient, progressive unit d/t amio gtt   TOC needs / Dispo plan: anticipate d/c to previous home environment, TBD Barriers to discharge / significant pending items: pending clinical improvement expect will be here 1-2 more days              Subjective / Brief ROS:  Patient is resting in bed, sleeping and a bit difficult to awaken but when awake is alert ans coherent, reports still anxious no chest pain at this time  Denies SOB Pain controlled.  Denies new weakness.    Family Communication: none at this time     Objective Findings:  Vitals:   05/05/22 0447 05/05/22 0500 05/05/22 0546 05/05/22 1219  BP:   (!) 145/96 110/82  Pulse:   (!) 120 (!) 124  Resp:      Temp:  98.1 F (36.7 C)    TempSrc:  Oral    SpO2:      Weight: 68.6 kg     Height:        Intake/Output Summary (Last 24 hours) at 05/05/2022 1519 Last data filed at 05/05/2022 1046 Gross per 24 hour  Intake 797.75 ml  Output --  Net 797.75 ml    Filed Weights   04/21/2022 0312 05/05/22 0447  Weight: 65.8 kg 68.6 kg    Examination:  Physical Exam Constitutional:      Appearance: She is not toxic-appearing.  Cardiovascular:     Rate  and Rhythm: Tachycardia present. Rhythm irregular.     Heart sounds: Murmur heard.     Systolic murmur is present.  Pulmonary:     Effort: Tachypnea present. No respiratory distress.     Breath sounds: Examination of the right-lower field reveals rales. Examination of the left-lower field reveals rales. Rales present.  Musculoskeletal:     Right lower leg: No edema.     Left lower leg: No edema.  Skin:    General: Skin is warm and dry.  Neurological:     General: No focal deficit present.     Mental Status: She is alert and oriented to person, place, and time.  Psychiatric:        Mood and Affect: Mood is anxious.          Scheduled Medications:   [START ON 05/06/2022] amiodarone  200 mg Oral BID   Followed by   Melene Muller ON 05/20/2022]  amiodarone  200 mg Oral Daily   apixaban  5 mg Oral BID   diltiazem  60 mg Oral Q6H   doxycycline  100 mg Oral Q12H   metoprolol tartrate  25 mg Oral Q6H   pravastatin  40 mg Oral QHS   tiZANidine  4 mg Oral BID    Continuous Infusions:  amiodarone 30 mg/hr (05/05/22 0534)   azithromycin     cefTRIAXone (ROCEPHIN)  IV 2 g (05/05/22 1217)    PRN Medications:  acetaminophen, albuterol, ALPRAZolam, busPIRone, dextromethorphan-guaiFENesin, hydrALAZINE, HYDROcodone-acetaminophen, ondansetron (ZOFRAN) IV  Antimicrobials from admission:  Anti-infectives (From admission, onward)    Start     Dose/Rate Route Frequency Ordered Stop   05/05/22 1600  azithromycin (ZITHROMAX) 500 mg in sodium chloride 0.9 % 250 mL IVPB        500 mg 250 mL/hr over 60 Minutes Intravenous Every 24 hours 05/05/22 1515 05/10/22 1559   04/22/2022 1900  doxycycline (VIBRA-TABS) tablet 100 mg        100 mg Oral Every 12 hours 05/12/2022 1129 05/10/22 2159   04/18/2022 1200  cefTRIAXone (ROCEPHIN) 2 g in sodium chloride 0.9 % 100 mL IVPB        2 g 200 mL/hr over 30 Minutes Intravenous Every 24 hours 05/02/2022 1129 05/10/22 1159   04/29/2022 0600  doxycycline (VIBRAMYCIN) 100 mg  in sodium chloride 0.9 % 250 mL IVPB        100 mg 125 mL/hr over 120 Minutes Intravenous  Once 04/21/2022 0459 05/02/2022 0810           Data Reviewed:  I have personally reviewed the following...  CBC: Recent Labs  Lab 05/08/2022 0329 05/04/22 0418 05/05/22 0502  WBC 13.6* 13.6* 19.4*  NEUTROABS 9.9*  --   --   HGB 10.5* 10.1* 10.2*  HCT 32.7* 31.4* 31.7*  MCV 90.8 91.5 90.6  PLT 455* 419* 490*   Basic Metabolic Panel: Recent Labs  Lab 05/16/2022 0329 05/04/22 0418 05/05/22 0502  NA 134* 137 137  K 4.9 4.6 4.5  CL 102 104 101  CO2 24 23 22   GLUCOSE 140* 145* 137*  BUN 42* 35* 45*  CREATININE 1.23* 1.19* 1.57*  CALCIUM 9.0 8.7* 8.7*  MG  --  2.5*  --    GFR: Estimated Creatinine Clearance: 27.7 mL/min (A) (by C-G formula based on SCr of 1.57 mg/dL (H)). Liver Function Tests: Recent Labs  Lab 04/24/2022 0329 05/05/22 0502  AST 58* 20  ALT 55* 32  ALKPHOS 312* 248*  BILITOT 0.6 0.4  PROT 7.6 7.4  ALBUMIN 3.4* 3.3*   Recent Labs  Lab 04/26/2022 0329  LIPASE 26   No results for input(s): "AMMONIA" in the last 168 hours. Coagulation Profile: No results for input(s): "INR", "PROTIME" in the last 168 hours. Cardiac Enzymes: No results for input(s): "CKTOTAL", "CKMB", "CKMBINDEX", "TROPONINI" in the last 168 hours. BNP (last 3 results) No results for input(s): "PROBNP" in the last 8760 hours. HbA1C: No results for input(s): "HGBA1C" in the last 72 hours. CBG: No results for input(s): "GLUCAP" in the last 168 hours. Lipid Profile: No results for input(s): "CHOL", "HDL", "LDLCALC", "TRIG", "CHOLHDL", "LDLDIRECT" in the last 72 hours. Thyroid Function Tests: Recent Labs    04/24/2022 0329 05/05/22 0502  TSH 1.501 0.677  FREET4 1.12  --    Anemia Panel: No results for input(s): "VITAMINB12", "FOLATE", "FERRITIN", "TIBC", "IRON", "RETICCTPCT" in the last 72 hours. Most Recent Urinalysis On File:  Component Value Date/Time   COLORURINE YELLOW (A)  04/17/2022 0811   APPEARANCEUR CLOUDY (A) 04/19/2022 0811   APPEARANCEUR Clear 02/19/2013 0916   LABSPEC 1.018 04/19/2022 0811   LABSPEC 1.010 02/19/2013 0916   PHURINE 5.0 04/22/2022 0811   GLUCOSEU NEGATIVE 04/26/2022 0811   GLUCOSEU Negative 02/19/2013 0916   HGBUR NEGATIVE 04/18/2022 0811   BILIRUBINUR NEGATIVE 05/01/2022 0811   BILIRUBINUR negative 10/23/2017 1324   BILIRUBINUR Negative 02/19/2013 0916   KETONESUR NEGATIVE 05/04/2022 0811   PROTEINUR 30 (A) 05/08/2022 0811   UROBILINOGEN 0.2 10/23/2017 1324   NITRITE NEGATIVE 04/27/2022 0811   LEUKOCYTESUR SMALL (A) 04/27/2022 0811   LEUKOCYTESUR Negative 02/19/2013 0916   Sepsis Labs: @LABRCNTIP (procalcitonin:4,lacticidven:4) Microbiology: Recent Results (from the past 240 hour(s))  Culture, blood (routine x 2)     Status: None (Preliminary result)   Collection Time: 04/20/2022  5:37 AM   Specimen: BLOOD RIGHT ARM  Result Value Ref Range Status   Specimen Description BLOOD RIGHT ARM  Final   Special Requests   Final    BOTTLES DRAWN AEROBIC AND ANAEROBIC Blood Culture adequate volume   Culture   Final    NO GROWTH 2 DAYS Performed at [facility], [address] [place], [place] [address]    Report Status PENDING  Incomplete  Resp panel by RT-PCR (RSV, Flu A&B, Covid) Anterior Nasal Swab     Status: None   Collection Time: 05/02/2022  5:37 AM   Specimen: Anterior Nasal Swab  Result Value Ref Range Status   SARS Coronavirus 2 by RT PCR NEGATIVE NEGATIVE Final    Comment: (NOTE) SARS-CoV-2 target nucleic acids are NOT DETECTED.  The SARS-CoV-2 RNA is generally detectable in upper respiratory specimens during the acute phase of infection. The lowest concentration of SARS-CoV-2 viral copies this assay can detect is 138 copies/mL. A negative result does not preclude SARS-Cov-2 infection and should not be used as the sole basis for treatment or other patient management decisions. A negative result may occur with   improper specimen collection/handling, submission of specimen other than nasopharyngeal swab, presence of viral mutation(s) within the areas targeted by this assay, and inadequate number of viral copies(<138 copies/mL). A negative result must be combined with clinical observations, patient history, and epidemiological information. The expected result is Negative.  Fact Sheet for Patients:  BloggerCourse.com  Fact Sheet for Healthcare Providers:  SeriousBroker.it  This test is no t yet approved or cleared by the [place] FDA and  has been authorized for detection and/or diagnosis of SARS-CoV-2 by FDA under an Emergency Use Authorization (EUA). This EUA will remain  in effect (meaning this test can be used) for the duration of the COVID-19 declaration under Section 564(b)(1) of the Act, 21 U.S.C.section 360bbb-3(b)(1), unless the authorization is terminated  or revoked sooner.       Influenza A by PCR NEGATIVE NEGATIVE Final   Influenza B by PCR NEGATIVE NEGATIVE Final    Comment: (NOTE) The Xpert Xpress SARS-CoV-2/FLU/RSV plus assay is intended as an aid in the diagnosis of influenza from Nasopharyngeal swab specimens and should not be used as a sole basis for treatment. Nasal washings and aspirates are unacceptable for Xpert Xpress SARS-CoV-2/FLU/RSV testing.  Fact Sheet for Patients: BloggerCourse.com  Fact Sheet for Healthcare Providers: SeriousBroker.it  This test is not yet approved or cleared by the [place] FDA and has been authorized for detection and/or diagnosis of SARS-CoV-2 by FDA under an Emergency Use Authorization (EUA). This EUA will remain  in effect (meaning this test can be used) for the duration of the COVID-19 declaration under Section 564(b)(1) of the Act, 21 U.S.C. section 360bbb-3(b)(1), unless the authorization is terminated or revoked.      Resp Syncytial Virus by PCR NEGATIVE NEGATIVE Final    Comment: (NOTE) Fact Sheet for Patients: BloggerCourse.com  Fact Sheet for Healthcare Providers: SeriousBroker.it  This test is not yet approved or cleared by the [place] FDA and has been authorized for detection and/or diagnosis of SARS-CoV-2 by FDA under an Emergency Use Authorization (EUA). This EUA will remain in effect (meaning this test can be used) for the duration of the COVID-19 declaration under Section 564(b)(1) of the Act, 21 U.S.C. section 360bbb-3(b)(1), unless the authorization is terminated or revoked.  Performed at [facility], [address] [place], [place] [address]   Culture, blood (routine x 2)     Status: None (Preliminary result)   Collection Time: 05/04/22  2:54 PM   Specimen: BLOOD  Result Value Ref Range Status   Specimen Description BLOOD BLOOD LEFT HAND  Final   Special Requests   Final    BOTTLES DRAWN AEROBIC AND ANAEROBIC Blood Culture adequate volume   Culture   Final    NO GROWTH < 24 HOURS Performed at [facility], [address], [place], [place] [address]    Report Status PENDING  Incomplete      Radiology Studies last 3 days: DG Chest Port 1 View  Result Date: 05/05/2022 CLINICAL DATA:  Shortness of breath. EXAM: PORTABLE CHEST 1 VIEW COMPARISON:  04/23/2022. FINDINGS: Increasing bibasilar opacities, left-greater-than-right, suspicious for aspiration or multifocal pneumonia. Unchanged small left pleural effusion. Stable cardiac and mediastinal contours. No pneumothorax. IMPRESSION: Increasing bibasilar opacities, greater on the left, suspicious for aspiration or multifocal pneumonia. Electronically Signed   By: Orvan Falconer M.D.   On: 05/05/2022 13:20   DG Chest Port 1 View  Result Date: 05/12/2022 CLINICAL DATA:  Chest pain EXAM: PORTABLE CHEST 1 VIEW COMPARISON:  04/20/2022 FINDINGS: Mild bilateral  lower lobe opacities, left greater than right, atelectasis versus pneumonia. Small left pleural effusion, new. No frank interstitial edema. Cardiomegaly. Thoracolumbar spine fixation hardware. Cervical spine fixation hardware, incompletely visualized. IMPRESSION: Mild bilateral lower lobe opacities, left greater than right, atelectasis versus pneumonia. Small left pleural effusion. Electronically Signed   By: Charline Bills M.D.   On: 05/01/2022 03:37             LOS: 2 days    Time spent: 50 min    Sunnie Nielsen, DO [facility] 05/05/2022, 3:19 PM    Dictation software may have been used to generate the above note. Typos may occur and escape review in typed/dictated notes. Please contact Dr Lyn Hollingshead directly for clarity if needed.  Staff may message me via secure chat in Epic  but this may not receive an immediate response,  please page me for urgent matters!  If 7PM-7AM, please contact night coverage [email or web address]

## 2022-05-05 NOTE — Progress Notes (Signed)
[facility] CLINIC CARDIOLOGY CONSULT NOTE       Patient ID: Molly Mitchell MRN: [id number] DOB/AGE: Sep 21, 1945 77 y.o.  Admit date: 05/02/2022 Referring Physician Dr. Sunnie Nielsen  Primary Physician Dr. Nemiah Commander Primary Cardiologist Minda Ditto, PA-C  Reason for Consultation AF RVR  HPI: Molly Mitchell is a 77yoF with a PMH of paroxysmal AF s/p recent Watchman (LAAO closure) placement 4/4, HFpEF, right CEA (09/2021), s/p spinal fusion who presented to [facility] ED 05/14/2022 with cough, shortness of breath, and chest pain for 2 days. Initial concern for CAP, but symptoms ultimately felt to be more related to acute on chronic HFpEF and decompensation from atrial fibrillation with RVR.  Cardiology is consulted for further assistance.  Interval History:  - interval events noted, remained in AF RVR yesterday PM despite PO metoprolol and diltiazem. IV amiodarone started  - Husband at bedside this AM, reported patient was very anxious again and had just received xanax prior to my arrival. She is sleeping comfortably  - after AM meds, rate a little better controlled in the 90s-110s with paroxysms up to the 120s - leukocytosis from 13 --19K today, afebrile.   Review of systems limited by patient's sedation.     Past Medical History:  Diagnosis Date   Acute right MCA stroke 08/01/2021   a.) noted on MRI head 08/01/2021 --> punctate acute RIGHT MCA territory infarcts affecting the frontal and parietal contex consistent with microembolic infarcts in the RIGHT carotid circulation   Anemia    Arthritis    Bilateral carotid artery disease 08/18/2021   a.) CTA neck 08/18/2021: 70-80% RICA and 40% LICA   Cervicalgia    Chronic kidney disease    Chronic pain syndrome    Chronic, continuous use of opioids    a.) hydrocodone/APAP (Norco 10/325 mg)   Connective tissue disease    pt reports being diagnosed with undifferentiated connective tissue disease at some point- pt took steroids for this and  has had no issues since- 10 years ago   Coronary artery disease    Cortical senile cataract    COVID 2021   DDD (degenerative disc disease), cervical    a.) s/p C4-C6 ACDF   DDD (degenerative disc disease), lumbar    a.) s/p laminectomy + posterior L2-S1 fusion   Dysrhythmia    A-fib   History of 2019 novel coronavirus disease (COVID-19) 12/07/2018   History of blood transfusion    History of heart murmur in childhood    History of rheumatic fever    HLD (hyperlipidemia)    Hypertension    Lower extremity weakness    a.) with position changes from sitting to standing   Osteoporosis    Presence of Watchman left atrial appendage closure device 04/20/2022   27mm Watchman FLX placed by Dr. Lalla Brothers   Pseudogout    Shingles    Skin cancer of face    Spondylolisthesis of lumbar region     Past Surgical History:  Procedure Laterality Date   ABDOMINAL HYSTERECTOMY  1981   AIKEN OSTEOTOMY Right 01/30/2018   Procedure: WEIL RIGHT 2ND AND 3RD;  Surgeon: Gwyneth Revels, DPM;  Location: [facility];  Service: Podiatry;  Laterality: Right;   ANTERIOR CERVICAL DECOMP/DISCECTOMY FUSION N/A 2003   C4-C6   BACK SURGERY     pt reports hx of 3 lumber back surgeries total   CERVICAL SPINE SURGERY     pt reports having 3 cervical fusions total   ENDARTERECTOMY Right 09/21/2021  Procedure: ENDARTERECTOMY CAROTID;  Surgeon: Renford Dills, MD;  Location: [facility] OR[facility];  Service: Vascular;  Laterality: Right;  Right carotid   HAMMER TOE SURGERY Right 01/30/2018   Procedure: HAMMER TOE CORRECTION SECOND AND THIRD;  Surgeon: Gwyneth Revels, DPM;  Location: [facility];  Service: Podiatry;  Laterality: Right;  GENERAL WITH LOCAL   HAMMER TOE SURGERY Left 03/05/2019   Procedure: HAMMER TOE CORRECTION T1 AND T2;  Surgeon: Gwyneth Revels, DPM;  Location: [facility];  Service: Podiatry;  Laterality: Left;   LEFT ATRIAL APPENDAGE OCCLUSION N/A 04/20/2022   Procedure: LEFT ATRIAL  APPENDAGE OCCLUSION;  Surgeon: Lanier Prude, MD;  Location: [facility]AS[facility] CV [facility];  Service: Cardiovascular;  Laterality: N/A;   POSTERIOR LUMBAR FUSION N/A 2001   L2-S1   ROTATOR CUFF REPAIR Right 1981   SACROPLASTY N/A 03/29/2017   S1   TEE WITHOUT CARDIOVERSION N/A 04/20/2022   Procedure: TRANSESOPHAGEAL ECHOCARDIOGRAM;  Surgeon: Lanier Prude, MD;  Location: [facility] INVASIVE CV LAB;  Service: Cardiovascular;  Laterality: N/A;   TOTAL KNEE ARTHROPLASTY Left 03/03/2013   TOTAL KNEE REVISION Left 07/31/2016   Procedure: TOTAL KNEE REVISION;  Surgeon: Donato Heinz, MD;  Location: [facility];  Service: Orthopedics;  Laterality: Left;   WEIL OSTEOTOMY Left 03/05/2019   Procedure: WEIL OSTEOTOMY X 2 LEFT;  Surgeon: Gwyneth Revels, DPM;  Location: [facility];  Service: Podiatry;  Laterality: Left;    Medications Prior to Admission  Medication Sig Dispense Refill Last Dose   amLODipine (NORVASC) 2.5 MG tablet Take 2.5-5 mg by mouth See admin instructions. Take 2.5 mg in the morning and 5 mg at bedtime   Past Week   amoxicillin (AMOXIL) 500 MG capsule Take 4 tablets by mouth 1 hour prior to dental procedures. 12 capsule 3 unknown   apixaban (ELIQUIS) 5 MG TABS tablet Take 1 tablet (5 mg total) by mouth 2 (two) times daily. 60 tablet  05/02/2022   carvedilol (COREG) 3.125 MG tablet Take 3.125 mg by mouth 2 (two) times daily with a meal.   Past Week   HYDROcodone-acetaminophen (NORCO/VICODIN) 5-325 MG tablet Take 2 tablets by mouth 4 (four) times daily.   Past Week   pravastatin (PRAVACHOL) 40 MG tablet Take 40 mg by mouth at bedtime.   05/02/2022   telmisartan (MICARDIS) 80 MG tablet Take 80 mg by mouth daily.   Past Week   tiZANidine (ZANAFLEX) 4 MG tablet Take 1 tablet (4 mg total) by mouth 2 (two) times daily.   05/02/2022    Social History   Socioeconomic History   Marital status: Married    Spouse name: [name]   Number of children: 2   Years of education: Not on file   Highest  education level: High school graduate  Occupational History   Occupation: retired   Occupation: Retired Engineer, [employer]  Tobacco Use   Smoking status: Former    Packs/day: 1.00    Years: 15.00    Additional pack years: 0.00    Total pack years: 15.00    Types: Cigarettes    Quit date: 04/17/1979    Years since quitting: 43.0   Smokeless tobacco: Never  Vaping Use   Vaping Use: Never used  Substance and Sexual Activity   Alcohol use: No    Alcohol/week: 0.0 standard drinks of alcohol   Drug use: No   Sexual activity: Not on file  Other Topics Concern   Not on file  Social History  Narrative   Not on file   Social Determinants of Health   Financial Resource Strain: Low Risk  (03/23/2020)   Overall Financial Resource Strain (CARDIA)    Difficulty of Paying Living Expenses: Not hard at all  Food Insecurity: No Food Insecurity (05/04/2022)   Hunger Vital Sign    Worried About Running Out of Food in the Last Year: Never true    Ran Out of Food in the Last Year: Never true  Transportation Needs: No Transportation Needs (05/04/2022)   PRAPARE - Administrator, Civil Service (Medical): No    Lack of Transportation (Non-Medical): No  Physical Activity: Sufficiently Active (03/23/2020)   Exercise Vital Sign    Days of Exercise per Week: 3 days    Minutes of Exercise per Session: 60 min  Stress: No Stress Concern Present (03/23/2020)   [employer] of Occupational Health - Occupational Stress Questionnaire    Feeling of Stress : Not at all  Social Connections: Socially Integrated (03/23/2020)   Social Connection and Isolation Panel [NHANES]    Frequency of Communication with Friends and Family: Never    Frequency of Social Gatherings with Friends and Family: More than three times a week    Attends Religious Services: More than 4 times per year    Active Member of Clubs or Organizations: Yes    Attends Banker Meetings: More than 4 times per  year    Marital Status: Married  Catering manager Violence: Not At Risk (05/04/2022)   Humiliation, Afraid, Rape, and Kick questionnaire    Fear of Current or Ex-Partner: No    Emotionally Abused: No    Physically Abused: No    Sexually Abused: No    Family History  Problem Relation Age of Onset   Hypertension Brother    Hypertension Mother    Hypertension Sister    Cancer Sister    Diabetes Paternal Grandmother    Heart failure Maternal Grandmother       Intake/Output Summary (Last 24 hours) at 05/05/2022 1046 Last data filed at 05/05/2022 1046 Gross per 24 hour  Intake 797.75 ml  Output --  Net 797.75 ml    Vitals:   05/05/22 0349 05/05/22 0447 05/05/22 0500 05/05/22 0546  BP:    (!) 145/96  Pulse: (!) 155   (!) 120  Resp: (!) 21     Temp: 98.1 F (36.7 C)  98.1 F (36.7 C)   TempSrc: Oral  Oral   SpO2: 91%     Weight:  68.6 kg    Height:        PHYSICAL EXAM General: elderly caucasian female, sitting up in bed, comfortably sleeping after receiving xanax. Husband at bedside. HEENT:  Normocephalic and atraumatic. Neck:  No JVD.  Lungs: Normal respiratory effort on O2 by [place]. Crackles in left base Heart: tachy irregular irregular . Normal S1 and S2 without gallops or murmurs. Left radial pulse 2+  Abdomen: Non-distended appearing.  Msk: Normal strength and tone for age. Extremities: Warm and well perfused. No clubbing, cyanosis. Trace bilateral LE edema.  Neuro: somnolent Psych:  unable to assess   Labs: Basic Metabolic Panel: Recent Labs    05/04/22 0418 05/05/22 0502  NA 137 137  K 4.6 4.5  CL 104 101  CO2 23 22  GLUCOSE 145* 137*  BUN 35* 45*  CREATININE 1.19* 1.57*  CALCIUM 8.7* 8.7*  MG 2.5*  --     Liver Function Tests: Recent Labs  04/17/2022 0329 05/05/22 0502  AST 58* 20  ALT 55* 32  ALKPHOS 312* 248*  BILITOT 0.6 0.4  PROT 7.6 7.4  ALBUMIN 3.4* 3.3*    Recent Labs    05/10/2022 0329  LIPASE 26    CBC: Recent Labs     04/30/2022 0329 05/04/22 0418 05/05/22 0502  WBC 13.6* 13.6* 19.4*  NEUTROABS 9.9*  --   --   HGB 10.5* 10.1* 10.2*  HCT 32.7* 31.4* 31.7*  MCV 90.8 91.5 90.6  PLT 455* 419* 490*    Cardiac Enzymes: Recent Labs    04/27/2022 0329 05/14/2022 0537  TROPONINIHS 6 5    BNP: Recent Labs    04/30/2022 0329  BNP 716.4*    D-Dimer: No results for input(s): "DDIMER" in the last 72 hours. Hemoglobin A1C: No results for input(s): "HGBA1C" in the last 72 hours. Fasting Lipid Panel: No results for input(s): "CHOL", "HDL", "LDLCALC", "TRIG", "CHOLHDL", "LDLDIRECT" in the last 72 hours. Thyroid Function Tests: Recent Labs    05/05/22 0502  TSH 0.677    Anemia Panel: No results for input(s): "VITAMINB12", "FOLATE", "FERRITIN", "TIBC", "IRON", "RETICCTPCT" in the last 72 hours.   Radiology: [facility] Chest Port 1 View  Result Date: 05/06/2022 CLINICAL DATA:  Chest pain EXAM: PORTABLE CHEST 1 VIEW COMPARISON:  04/20/2022 FINDINGS: Mild bilateral lower lobe opacities, left greater than right, atelectasis versus pneumonia. Small left pleural effusion, new. No frank interstitial edema. Cardiomegaly. Thoracolumbar spine fixation hardware. Cervical spine fixation hardware, incompletely visualized. IMPRESSION: Mild bilateral lower lobe opacities, left greater than right, atelectasis versus pneumonia. Small left pleural effusion. Electronically Signed   By: Charline Bills M.D.   On: 04/28/2022 03:37   DG Chest 2 View  Result Date: 04/22/2022 CLINICAL DATA:  77 year old female, preoperative evaluation. EXAM: CHEST - 2 VIEW COMPARISON:  10/13/2013, 01/30/2022 FINDINGS: The mediastinal contours are within normal limits. No cardiomegaly. The lungs are clear bilaterally without evidence of focal consolidation, pleural effusion, or pneumothorax. Multilevel, incompletely visualized thoracolumbar fusion hardware in place. Lower cervical ACDF hardware in place. No acute osseous abnormality. IMPRESSION: No acute  cardiopulmonary process. Electronically Signed   By: Marliss Coots M.D.   On: 04/22/2022 11:34   EP STUDY  Result Date: 04/20/2022 CONCLUSIONS: 1.Successful implantation of a WATCHMAN left atrial appendage occlusive device   2. TEE demonstrating no LAA thrombus 3. No early apparent complications. Post Implant Anticoagulation Strategy: Continue Eliquis 5mg  PO BID x 45 days after implant. After 45 days, stop Eliquis and start Plavix 75mg  PO daily to complete 6 months of post implant therapy. Plan for CT scan 60 days after implant to assess Watchman position.   ECHO TEE  Result Date: 04/20/2022    TRANSESOPHOGEAL ECHO REPORT   Patient Name:   Molly Mitchell Date of Exam: 04/20/2022 Medical Rec #:  [id number]      Height:       62.0 in Accession #:    [id number]     Weight:       145.0 lb Date of Birth:  15-Dec-1945       BSA:          1.667 m Patient Age:    77 years       BP:           158/56 mmHg Patient Gender: F              HR:           76 bpm. Exam Location:  Inpatient Procedure: Transesophageal Echo, 3D Echo, Color Doppler and Cardiac Doppler Indications:     I48.2 Chronic atrial fibrillation  History:         Patient has prior history of Echocardiogram examinations, most                  recent 09/20/2021. Arrythmias:Atrial Fibrillation; Risk                  Factors:Hypertension and Dyslipidemia.  Sonographer:     Irving Burton Senior RDCS Referring Phys:  [id number] Lanier Prude Diagnosing Phys: Riley Lam MD  Sonographer Comments: 27mm Watchman FLX Implanted PROCEDURE: After discussion of the risks and benefits of a TEE, an informed consent was obtained from the patient. The transesophogeal probe was passed without difficulty through the esophogus of the patient. Sedation performed by different physician. The patient was monitored while under deep sedation. The patient developed no complications during the procedure.  IMPRESSIONS  1. Prior to procedure, patent left atrial appendage. Maximal diameter 2.19  cm (ostial measurements). Sufficient sizing for a 27 mm Watchman FLX device.  2. A mid-posterior transeptal puncture was performed.  3. a 27 mm Watchman FLX was deployed. Greater than 1/2 shoulder seen. Small tissue compression seen at the anterior mitral valve annulus. No thrombus seen. Device recaptured.  4. At redeployment a 27 mm Watchman FLX was present. No peri-device leak. No thrombus. A 1/3-1/2 mitral shoulder was noted in the 135 view. Average compression ~ 15%.  5. No significant pericardial effusion seen throughout study.  6. Iatrogenic ASD present. Majority of flow was left to right, rare right to left shunting.  7. Left ventricular ejection fraction, by estimation, is 55 to 60%. The left ventricle has normal function.  8. Right ventricular systolic function is normal. The right ventricular size is normal.  9. Left atrial size was moderately dilated. No left atrial/left atrial appendage thrombus was detected. 10. Right atrial size was mildly dilated. 11. The mitral valve is normal in structure. Mild mitral valve regurgitation. 12. The aortic valve is tricuspid. Aortic valve regurgitation is not visualized. No aortic stenosis is present. 13. Evidence of atrial level shunting detected by color flow Doppler. FINDINGS  Left Ventricle: Left ventricular ejection fraction, by estimation, is 55 to 60%. The left ventricle has normal function. The left ventricular internal cavity size was normal in size. Right Ventricle: The right ventricular size is normal. Right vetricular wall thickness was not well visualized. Right ventricular systolic function is normal. Left Atrium: Left atrial size was moderately dilated. No left atrial/left atrial appendage thrombus was detected. Right Atrium: Right atrial size was mildly dilated. Pericardium: There is no evidence of pericardial effusion. Mitral Valve: The mitral valve is normal in structure. Mild mitral valve regurgitation. Tricuspid Valve: The tricuspid valve is normal  in structure. Tricuspid valve regurgitation is trivial. No evidence of tricuspid stenosis. Aortic Valve: The aortic valve is tricuspid. Aortic valve regurgitation is not visualized. No aortic stenosis is present. Pulmonic Valve: The pulmonic valve was normal in structure. Pulmonic valve regurgitation is not visualized. No evidence of pulmonic stenosis. Aorta: The aortic root, ascending aorta, aortic arch and descending aorta are all structurally normal, with no evidence of dilitation or obstruction. IAS/Shunts: Evidence of atrial level shunting detected by color flow Doppler. Additional Comments: Spectral Doppler performed. Riley Lam MD Electronically signed by Riley Lam MD Signature Date/Time: 04/20/2022/10:50:03 AM    Final     ECHO - TEE A04/04/2022  1. Prior to procedure, patent  left atrial appendage. Maximal diameter  2.19 cm (ostial measurements). Sufficient sizing for a 27 mm Watchman FLX  device.   2. A mid-posterior transeptal puncture was performed.   3. a 27 mm Watchman FLX was deployed. Greater than 1/2 shoulder seen.  Small tissue compression seen at the anterior mitral valve annulus. No  thrombus seen. Device recaptured.   4. At redeployment a 27 mm Watchman FLX was present. No peri-device leak.  No thrombus. A 1/3-1/2 mitral shoulder was noted in the 135 view. Average  compression ~ 15%.   5. No significant pericardial effusion seen throughout study.   6. Iatrogenic ASD present. Majority of flow was left to right, rare right  to left shunting.   7. Left ventricular ejection fraction, by estimation, is 55 to 60%. The  left ventricle has normal function.   8. Right ventricular systolic function is normal. The right ventricular  size is normal.   9. Left atrial size was moderately dilated. No left atrial/left atrial  appendage thrombus was detected.  10. Right atrial size was mildly dilated.  11. The mitral valve is normal in structure. Mild mitral valve   regurgitation.  12. The aortic valve is tricuspid. Aortic valve regurgitation is not  visualized. No aortic stenosis is present.  13. Evidence of atrial level shunting detected by color flow Doppler.    TELEMETRY reviewed by me (LT) 05/05/2022 : overnight AF RVR rates 120-130, this AM AF rates 90s- 110s   EKG reviewed by me: NSR 74 nonspecific TW abnormality   Data reviewed by me (LT) 05/05/2022:  hospitalist progress note, nursing notes last 24h vitals tele labs imaging I/O    Principal Problem:   CAP (community acquired pneumonia) Active Problems:   HLD (hyperlipidemia)   Chronic atrial fibrillation with RVR   HTN (hypertension)   Chronic diastolic CHF (congestive heart failure)   Chronic kidney disease, stage 3a   Chronic pain   Asthma   Normocytic anemia   Sepsis    ASSESSMENT AND PLAN:  Molly Mitchell is a 73yoF with a PMH of paroxysmal AF s/p recent Watchman (LAAO closure) placement 4/4, HFpEF, right CEA (09/2021), s/p spinal fusion who presented to [facility] ED 04/25/2022 with cough, shortness of breath, and chest pain for 2 days. Initial concern for CAP, but symptoms ultimately felt to be more related to acute on chronic HFpEF and decompensation from atrial fibrillation with RVR.  Cardiology is consulted for further assistance.  # paroxysmal AF RVR # s/p Watchman LAAO closure device 04/20/2022 Presents with shortness of breath and chest pain x 2 days, in AF RVR with difficult to control HR. Very symptomatic with anxiety. Rate better controlled this AM in the 90s-110s.  - s/p diltiazem infusion with borderline low BP - increase PO diltiazem to  q6h - increase metoprolol tartrate  to  PO q6h - s/p IV amiodarone bolus. Continue infusion until tomorrow AM, then start PO amio  BID for 14 days, then  daily thereafter. - continue eliquis  BID x 45 days following Watchman as per EP  - baseline LFTs: AST/ALT 20/32 respectively  - TSH 1.5, free T4 1.12 - careful  dosing of anxiolytics as patient became very sedated on valium  - hesitant to consider DCCV before repeat imaging to r/o thrombus with recent Watchman placement    # acute on chronic HFpEF  BNP elevated at 700. Clinically somewhat volume up with trace peripheral edema and an oxygen requirement. Initial Cxr with small pleural effusion.  -  s/p IV lasix 40mg  x 1 yesterday -repeat CXR, consider additional lasix dosing ?tomorrow   # AKI on CKD 3 Renal function worsening with lasix yesterday PM. Continue to monitor closely.   This patient's plan of care was discussed and created with Dr. Juliann Pares and he is in agreement.  Signed: Rebeca Allegra , PA-C 05/05/2022, 10:46 AM [facility]

## 2022-05-06 ENCOUNTER — Inpatient Hospital Stay: Payer: Medicare HMO

## 2022-05-06 ENCOUNTER — Inpatient Hospital Stay
Admit: 2022-05-06 | Discharge: 2022-05-06 | Disposition: A | Discharge status: Home / Self Care | Payer: Medicare HMO | Attending: Cardiovascular Disease | Admitting: Cardiovascular Disease

## 2022-05-06 DIAGNOSIS — I482 Chronic atrial fibrillation, unspecified: Secondary | ICD-10-CM | POA: Diagnosis not present

## 2022-05-06 DIAGNOSIS — J4520 Mild intermittent asthma, uncomplicated: Secondary | ICD-10-CM | POA: Diagnosis not present

## 2022-05-06 DIAGNOSIS — A419 Sepsis, unspecified organism: Secondary | ICD-10-CM | POA: Diagnosis not present

## 2022-05-06 DIAGNOSIS — I10 Essential (primary) hypertension: Secondary | ICD-10-CM | POA: Diagnosis not present

## 2022-05-06 DIAGNOSIS — J189 Pneumonia, unspecified organism: Secondary | ICD-10-CM | POA: Diagnosis not present

## 2022-05-06 LAB — BASIC METABOLIC PANEL
Anion gap: 12 (ref 5–15)
BUN: 45 mg/dL — ABNORMAL HIGH (ref 8–23)
CO2: 22 mmol/L (ref 22–32)
Calcium: 8 mg/dL — ABNORMAL LOW (ref 8.9–10.3)
Chloride: 102 mmol/L (ref 98–111)
Creatinine, Ser: 1.53 mg/dL — ABNORMAL HIGH (ref 0.44–1.00)
GFR, Estimated: 35 mL/min — ABNORMAL LOW (ref >60)
Glucose, Bld: 110 mg/dL — ABNORMAL HIGH (ref 70–99)
Potassium: 4.1 mmol/L (ref 3.5–5.1)
Sodium: 136 mmol/L (ref 135–145)

## 2022-05-06 LAB — ECHOCARDIOGRAM COMPLETE
AR max vel: 1.77 cm2
AV Peak grad: 10.9 mmHg
Ao pk vel: 1.65 m/s
Area-P 1/2: 2.94 cm2
Height: 62 in
S' Lateral: 2.7 cm
Weight: 2398.6 oz

## 2022-05-06 MED ORDER — FUROSEMIDE 10 MG/ML IJ SOLN
40.0000 mg | Freq: Once | INTRAMUSCULAR | Status: AC
  Administered 2022-05-06: 40 mg via INTRAVENOUS
  Filled 2022-05-06: qty 4

## 2022-05-06 MED ORDER — AMIODARONE HCL IN DEXTROSE 360-4.14 MG/200ML-% IV SOLN
30.0000 mg/h | INTRAVENOUS | Status: DC
  Administered 2022-05-06 – 2022-05-07 (×3): 30 mg/h via INTRAVENOUS
  Filled 2022-05-06 (×3): qty 200

## 2022-05-06 NOTE — Progress Notes (Signed)
  Echocardiogram 2D Echocardiogram has been performed.  Molly Mitchell 05/06/2022, 3:34 PM

## 2022-05-06 NOTE — Progress Notes (Signed)
At approximately 2346 hrs, RN notified NP Jon Billings about pt's HR sustaining between 48-55 (SB), and BP: 86/50. Pt on continuous Amiodarone drip (30 mg/hr) and per NP, the Amiodarone drip was stopped due to pt's bradycardia and hypotension.

## 2022-05-06 NOTE — Progress Notes (Signed)
PROGRESS NOTE    Molly Mitchell   ZOX:[id number] DOB: 1945/05/14  DOA: 04/20/2022 Date of Service: 05/06/22 PCP: [facility]     Brief Narrative / Hospital Course:  Molly Mitchell is a 78 y.o. female with medical history significant of A-fib on Eliquis, s/p of Watchman left atrial appendage closure device placement on 04/20/23, hypertension, hyperlipidemia, asthma, diastolic CHF, stroke, CKD-3A, chronic pain, carotid artery stenosis, who presents to the ED from home 05/16/2022 with cough, shortness of breath, chest pain x2 days. Patient recently had Watchman left atrial appendage closure device placement on 04/20/23.  04/17: atrial fibrillation with RVR, heart rate up to 137.  Patient was given 10 mg of Cardizem with temporary improved heart rate, but heart rates increased again, and started on cardizem drip in ED. Negative PCR for COVID, flu and RSV, WBC 13.7, lactic acid 1.3, procalcitonin 0.19. Urinalysis (cloudy appearance, small amount of leukocyte, negative nitrite, WBC 0-5 with squamous cell contamination 21-50), slightly worsening renal function, BNP 716, trop 6 --> 5. Temperature normal, blood pressure 102/74, RR 25-37, oxygen saturation 97% on room air. Pt is admitted PCU as inpt. Tx for Afib RVR and sepsis d/t CAP. Hr initially improved to 80s. 04/18: significantly anxious, became over sedated w/ valium, low BP, improved in afternoon. Echo reviewed from Watchman device placement - TEE 04/20/22 LVEF 55-60% normal LV and RV fxn, LA mod dilated. Cardiology to adjust rate/rhythm control --> d/c dilt gtt, start po diltiazem, change coreg to metoprolol 12.5 mg po q6h, started amiodarone bolus/gtt. Remained very anxious - alprazolam lower dose administered.  04/19:  converted to sinus rhythm about mid-day. Concern for worsening infiltrate on CXR, add azithromycin, consider repeat CXR in AM. 04/20: amio gtt d/c overnight d/t hypotension/bradycardia. BP and HR normalized. CXR personally  reviewed - infiltrate worse on L appears more c/w edema/vascular congestion, trial another dose lasix 40 mg IV diuresis. RVR recurred, restarted amiodarone gtt lower dise since had po amio also, BP a bit low so holding metoprolol po for now. Cardiology following.   Consultants:  Cardiology   Procedures: None       ASSESSMENT & PLAN:   Principal Problem:   CAP (community acquired pneumonia) Active Problems:   Sepsis   Chronic diastolic CHF (congestive heart failure)   Chronic atrial fibrillation with RVR   Asthma   HTN (hypertension)   Chronic pain   HLD (hyperlipidemia)   Chronic kidney disease, stage 3a   Normocytic anemia  Sepsis due to CAP (community acquired pneumonia) vs SIRS d/t Afib RVR and anxiety  Elevated HR and RR can be either/both etiology Met criteria with WBC 13.6, heart rate up to 137 and RR 25-37.  Lactic acid normal 1.3.  Procalcitonin 0.19.   Slightly worsening renal function with creatinine 1.23, BUN 42, GFR 46 (recent baseline creatinine 1.14 on 03/22/2022).    Urine legionella and S. pneumococcal antigen - neg IV Rocephin and doxycycline to cover PNA Antianxiety medications as tolerated Trial re-dose Lasix - question rales on exam / pulmonary edema on CXR vs PNA Mucinex for cough  Bronchodilators CXR concerning for worsening pneumonia - added azithromycin yesterday but suspect may be more related to CHF   Chronic atrial fibrillation with acute RVR:  Heart rate up to 137 --> 80s --> 130s again Amiodarone gtt per cardiology --> was held d/t low BP/HR then restarted d/t recurrence RVR  start PO amio 200mg  BID for 14 days, then 200mg  daily thereafter.  Metoprolol had been  increased to 25 mg po q6h yesterday --> low BP, holding this for now  Eliquis 5 mg po bid --> continue eliquis  BID x 45 days following Watchman as per EP  Cardiology following, appreciate assistance    Acute hypoxic respiratory failure  Supplemental O2 as needed   Chronic  diastolic CHF (congestive heart failure):  2D echo on 04/20/2022 showed EF of 55-60%.  = S/p lasix, repeat today 40 mg Strict I&O ordered but no output has been documented   Severe anxiety Sensitivity to benzodiazepine (oversedation w/ Valium)  Scheduled BuSpar Alprazolam 0.25 mg tid prn  Asthma Bronchodilators   HTN (hypertension): IV hydralazine as needed Meds as above    Chronic pain Continue as needed Norco, As needed Tylenol   HLD (hyperlipidemia) Pravastatin   AKI on Chronic kidney disease, stage 3a:  Slightly worsening renal function.   Baseline creatinine 1.14 on 03/22/2022 --> 1.23 on admission.  BUN 42, GFR 46 Monitor BMP   Normocytic anemia:  Hemoglobin 10.5 (12.1 on 03/22/2022), no active bleeding. Follow-up with CBC   DVT prophylaxis: Eliquis Pertinent IV fluids/nutrition: no continuous IV fluids at this time  Central lines / invasive devices: none  Code Status: FULL CODE ACP documents reviewed: none on file   Current Admission Status: inpatient, progressive unit d/t amio gtt   TOC needs / Dispo plan: anticipate d/c to previous home environment, TBD Barriers to discharge / significant pending items: pending clinical improvement expect will be here 1-2 more days              Subjective / Brief ROS:  Patient is alert, reports anxiety, reports SOB is better, no chest pain Pain controlled.  Denies new weakness.    Family Communication: husband at bedside on rounds     Objective Findings:  Vitals:   05/06/22 0757 05/06/22 1000 05/06/22 1159 05/06/22 1245  BP: 103/60  (!) 86/67 95/65  Pulse: 88  (!) 125 78  Resp: Temp: 97.6 F (36.4 C)   (!) 97.5 F (36.4 C)  TempSrc: Oral   Oral  SpO2: 100%  97% 98%  Weight:  68 kg    Height:        Intake/Output Summary (Last 24 hours) at 05/06/2022 1334 Last data filed at 05/06/2022 1259 Gross per 24 hour  Intake 778.98 ml  Output 300 ml  Net 478.98 ml    Filed Weights   04/25/2022  0312 05/05/22 0447 05/06/22 1000  Weight: 65.8 kg 68.6 kg 68 kg    Examination:  Physical Exam Constitutional:      Appearance: She is not toxic-appearing.  Cardiovascular:     Rate and Rhythm: Tachycardia present. Rhythm irregular.     Heart sounds: Murmur heard.     Systolic murmur is present.  Pulmonary:     Effort: Tachypnea present. No respiratory distress.     Breath sounds: Examination of the right-lower field reveals rales. Examination of the left-lower field reveals rales. Rales present.  Musculoskeletal:     Right lower leg: No edema.     Left lower leg: No edema.  Skin:    General: Skin is warm and dry.  Neurological:     General: No focal deficit present.     Mental Status: She is alert and oriented to person, place, and time.  Psychiatric:        Mood and Affect: Mood is anxious.          Scheduled Medications:  apixaban  5 mg Oral BID   doxycycline  100 mg Oral Q12H   pravastatin  40 mg Oral QHS   tiZANidine  4 mg Oral BID    Continuous Infusions:  amiodarone     azithromycin 500 mg (05/05/22 1646)   cefTRIAXone (ROCEPHIN)  IV 2 g (05/06/22 1210)    PRN Medications:  acetaminophen, albuterol, ALPRAZolam, busPIRone, dextromethorphan-guaiFENesin, HYDROcodone-acetaminophen, ondansetron (ZOFRAN) IV  Antimicrobials from admission:  Anti-infectives (From admission, onward)    Start     Dose/Rate Route Frequency Ordered Stop   05/05/22 1600  azithromycin (ZITHROMAX) 500 mg in sodium chloride 0.9 % 250 mL IVPB        500 mg 250 mL/hr over 60 Minutes Intravenous Every 24 hours 05/05/22 1515 05/10/22 1559   05/07/2022 1900  doxycycline (VIBRA-TABS) tablet 100 mg        100 mg Oral Every 12 hours 04/22/2022 1129 05/10/22 2159   05/05/2022 1200  cefTRIAXone (ROCEPHIN) 2 g in sodium chloride 0.9 % 100 mL IVPB        2 g 200 mL/hr over 30 Minutes Intravenous Every 24 hours 04/19/2022 1129 05/10/22 1159   05/13/2022 0600  doxycycline (VIBRAMYCIN) 100 mg in sodium  chloride 0.9 % 250 mL IVPB        100 mg 125 mL/hr over 120 Minutes Intravenous  Once 05/11/2022 0459 05/02/2022 0810           Data Reviewed:  I have personally reviewed the following...  CBC: Recent Labs  Lab 05/04/2022 0329 05/04/22 0418 05/05/22 0502  WBC 13.6* 13.6* 19.4*  NEUTROABS 9.9*  --   --   HGB 10.5* 10.1* 10.2*  HCT 32.7* 31.4* 31.7*  MCV 90.8 91.5 90.6  PLT 455* 419* 490*   Basic Metabolic Panel: Recent Labs  Lab 05/08/2022 0329 05/04/22 0418 05/05/22 0502 05/06/22 0435  NA 134* 137 137 136  K 4.9 4.6 4.5 4.1  CL 102 104 101 102  CO2 24 23 22 22   GLUCOSE 140* 145* 137* 110*  BUN 42* 35* 45* 45*  CREATININE 1.23* 1.19* 1.57* 1.53*  CALCIUM 9.0 8.7* 8.7* 8.0*  MG  --  2.5*  --   --    GFR: Estimated Creatinine Clearance: 28.3 mL/min (A) (by C-G formula based on SCr of 1.53 mg/dL (H)). Liver Function Tests: Recent Labs  Lab 05/06/2022 0329 05/05/22 0502  AST 58* 20  ALT 55* 32  ALKPHOS 312* 248*  BILITOT 0.6 0.4  PROT 7.6 7.4  ALBUMIN 3.4* 3.3*   Recent Labs  Lab 05/08/2022 0329  LIPASE 26   No results for input(s): "AMMONIA" in the last 168 hours. Coagulation Profile: No results for input(s): "INR", "PROTIME" in the last 168 hours. Cardiac Enzymes: No results for input(s): "CKTOTAL", "CKMB", "CKMBINDEX", "TROPONINI" in the last 168 hours. BNP (last 3 results) No results for input(s): "PROBNP" in the last 8760 hours. HbA1C: No results for input(s): "HGBA1C" in the last 72 hours. CBG: No results for input(s): "GLUCAP" in the last 168 hours. Lipid Profile: No results for input(s): "CHOL", "HDL", "LDLCALC", "TRIG", "CHOLHDL", "LDLDIRECT" in the last 72 hours. Thyroid Function Tests: Recent Labs    05/05/22 0502  TSH 0.677   Anemia Panel: No results for input(s): "VITAMINB12", "FOLATE", "FERRITIN", "TIBC", "IRON", "RETICCTPCT" in the last 72 hours. Most Recent Urinalysis On File:     Component Value Date/Time   COLORURINE YELLOW (A)  04/22/2022 0811   APPEARANCEUR CLOUDY (A) 04/21/2022 0811   APPEARANCEUR Clear  02/19/2013 0916   LABSPEC 1.018 05/05/2022 0811   LABSPEC 1.010 02/19/2013 0916   PHURINE 5.0 04/20/2022 0811   GLUCOSEU NEGATIVE 05/10/2022 0811   GLUCOSEU Negative 02/19/2013 0916   HGBUR NEGATIVE 05/13/2022 0811   BILIRUBINUR NEGATIVE 04/20/2022 0811   BILIRUBINUR negative 10/23/2017 1324   BILIRUBINUR Negative 02/19/2013 0916   KETONESUR NEGATIVE 04/21/2022 0811   PROTEINUR 30 (A) 05/04/2022 0811   UROBILINOGEN 0.2 10/23/2017 1324   NITRITE NEGATIVE 05/14/2022 0811   LEUKOCYTESUR SMALL (A) 05/05/2022 0811   LEUKOCYTESUR Negative 02/19/2013 0916   Sepsis Labs: (procalcitonin:4,lacticidven:4) Microbiology: Recent Results (from the past 240 hour(s))  Culture, blood (routine x 2)     Status: None (Preliminary result)   Collection Time: 05/10/2022  5:37 AM   Specimen: BLOOD RIGHT ARM  Result Value Ref Range Status   Specimen Description BLOOD RIGHT ARM  Final   Special Requests   Final    BOTTLES DRAWN AEROBIC AND ANAEROBIC Blood Culture adequate volume   Culture   Final    NO GROWTH 3 DAYS Performed at [facility], [address] [place], [place] [address]    Report Status PENDING  Incomplete  Resp panel by RT-PCR (RSV, Flu A&B, Covid) Anterior Nasal Swab     Status: None   Collection Time: 04/28/2022  5:37 AM   Specimen: Anterior Nasal Swab  Result Value Ref Range Status   SARS Coronavirus 2 by RT PCR NEGATIVE NEGATIVE Final    Comment: (NOTE) SARS-CoV-2 target nucleic acids are NOT DETECTED.  The SARS-CoV-2 RNA is generally detectable in upper respiratory specimens during the acute phase of infection. The lowest concentration of SARS-CoV-2 viral copies this assay can detect is 138 copies/mL. A negative result does not preclude SARS-Cov-2 infection and should not be used as the sole basis for treatment or other patient management decisions. A negative result may occur with   improper specimen collection/handling, submission of specimen other than nasopharyngeal swab, presence of viral mutation(s) within the areas targeted by this assay, and inadequate number of viral copies(<138 copies/mL). A negative result must be combined with clinical observations, patient history, and epidemiological information. The expected result is Negative.  Fact Sheet for Patients:  BloggerCourse.com  Fact Sheet for Healthcare Providers:  SeriousBroker.it  This test is no t yet approved or cleared by the [place] FDA and  has been authorized for detection and/or diagnosis of SARS-CoV-2 by FDA under an Emergency Use Authorization (EUA). This EUA will remain  in effect (meaning this test can be used) for the duration of the COVID-19 declaration under Section 564(b)(1) of the Act, 21 U.S.C.section 360bbb-3(b)(1), unless the authorization is terminated  or revoked sooner.       Influenza A by PCR NEGATIVE NEGATIVE Final   Influenza B by PCR NEGATIVE NEGATIVE Final    Comment: (NOTE) The Xpert Xpress SARS-CoV-2/FLU/RSV plus assay is intended as an aid in the diagnosis of influenza from Nasopharyngeal swab specimens and should not be used as a sole basis for treatment. Nasal washings and aspirates are unacceptable for Xpert Xpress SARS-CoV-2/FLU/RSV testing.  Fact Sheet for Patients: BloggerCourse.com  Fact Sheet for Healthcare Providers: SeriousBroker.it  This test is not yet approved or cleared by the [place] FDA and has been authorized for detection and/or diagnosis of SARS-CoV-2 by FDA under an Emergency Use Authorization (EUA). This EUA will remain in effect (meaning this test can be used) for the duration of the COVID-19 declaration under Section 564(b)(1) of the Act, 21  U.S.C. section 360bbb-3(b)(1), unless the authorization is terminated or revoked.      Resp Syncytial Virus by PCR NEGATIVE NEGATIVE Final    Comment: (NOTE) Fact Sheet for Patients: BloggerCourse.com  Fact Sheet for Healthcare Providers: SeriousBroker.it  This test is not yet approved or cleared by the [place] FDA and has been authorized for detection and/or diagnosis of SARS-CoV-2 by FDA under an Emergency Use Authorization (EUA). This EUA will remain in effect (meaning this test can be used) for the duration of the COVID-19 declaration under Section 564(b)(1) of the Act, 21 U.S.C. section 360bbb-3(b)(1), unless the authorization is terminated or revoked.  Performed at [facility], [address] [place], [place] [address]   Culture, blood (routine x 2)     Status: None (Preliminary result)   Collection Time: 05/04/22  2:54 PM   Specimen: BLOOD  Result Value Ref Range Status   Specimen Description BLOOD BLOOD LEFT HAND  Final   Special Requests   Final    BOTTLES DRAWN AEROBIC AND ANAEROBIC Blood Culture adequate volume   Culture   Final    NO GROWTH 2 DAYS Performed at [facility], [address] [place], [place] [address]    Report Status PENDING  Incomplete      Radiology Studies last 3 days: DG Chest Port 1 View  Result Date: 05/06/2022 CLINICAL DATA:  Shortness of breath EXAM: PORTABLE CHEST 1 VIEW COMPARISON:  05/05/2018 FINDINGS: Cardiac shadow is enlarged but stable. Watchman device is again seen. Postsurgical changes in the thoracolumbar spine are noted. Slight increased central vascular congestion is noted with mild interstitial edema. Small effusions are noted bilaterally. IMPRESSION: Slight increase in vascular congestion with mild interstitial edema. Small effusions are again noted. Electronically Signed   By: Alcide Clever M.D.   On: 05/06/2022 09:29   DG Chest Port 1 View  Result Date: 05/05/2022 CLINICAL DATA:  Shortness of breath. EXAM: PORTABLE CHEST 1 VIEW  COMPARISON:  04/23/2022. FINDINGS: Increasing bibasilar opacities, left-greater-than-right, suspicious for aspiration or multifocal pneumonia. Unchanged small left pleural effusion. Stable cardiac and mediastinal contours. No pneumothorax. IMPRESSION: Increasing bibasilar opacities, greater on the left, suspicious for aspiration or multifocal pneumonia. Electronically Signed   By: Orvan Falconer M.D.   On: 05/05/2022 13:20   DG Chest Port 1 View  Result Date: 04/21/2022 CLINICAL DATA:  Chest pain EXAM: PORTABLE CHEST 1 VIEW COMPARISON:  04/20/2022 FINDINGS: Mild bilateral lower lobe opacities, left greater than right, atelectasis versus pneumonia. Small left pleural effusion, new. No frank interstitial edema. Cardiomegaly. Thoracolumbar spine fixation hardware. Cervical spine fixation hardware, incompletely visualized. IMPRESSION: Mild bilateral lower lobe opacities, left greater than right, atelectasis versus pneumonia. Small left pleural effusion. Electronically Signed   By: Charline Bills M.D.   On: 05/16/2022 03:37             LOS: 3 days    Time spent: 50 min    Sunnie Nielsen, DO [facility] 05/06/2022, 1:34 PM    Dictation software may have been used to generate the above note. Typos may occur and escape review in typed/dictated notes. Please contact Dr Lyn Hollingshead directly for clarity if needed.  Staff may message me via secure chat in Epic  but this may not receive an immediate response,  please page me for urgent matters!  If 7PM-7AM, please contact night coverage [email or web address]

## 2022-05-06 NOTE — Progress Notes (Signed)
SUBJECTIVE: Patient is very short of breath and restless and has been anxious.   Vitals:   05/06/22 0413 05/06/22 0632 05/06/22 0757 05/06/22 1000  BP: (!) 105/57 111/64 103/60   Pulse: (!) 58  88   Resp:   18   Temp: 98 F (36.7 C)  97.6 F (36.4 C)   TempSrc: Oral  Oral   SpO2: 100%  100%   Weight:    68 kg  Height:        Intake/Output Summary (Last 24 hours) at 05/06/2022 1112 Last data filed at 05/05/2022 2346 Gross per 24 hour  Intake 778.98 ml  Output --  Net 778.98 ml    LABS: Basic Metabolic Panel: Recent Labs    05/04/22 0418 05/05/22 0502 05/06/22 0435  NA 137 137 136  K 4.6 4.5 4.1  CL 104 101 102  CO2 GLUCOSE 145* 137* 110*  BUN 35* 45* 45*  CREATININE 1.19* 1.57* 1.53*  CALCIUM 8.7* 8.7* 8.0*  MG 2.5*  --   --    Liver Function Tests: Recent Labs    05/05/22 0502  AST 20  ALT 32  ALKPHOS 248*  BILITOT 0.4  PROT 7.4  ALBUMIN 3.3*   No results for input(s): "LIPASE", "AMYLASE" in the last 72 hours. CBC: Recent Labs    05/04/22 0418 05/05/22 0502  WBC 13.6* 19.4*  HGB 10.1* 10.2*  HCT 31.4* 31.7*  MCV 91.5 90.6  PLT 419* 490*   Cardiac Enzymes: No results for input(s): "CKTOTAL", "CKMB", "CKMBINDEX", "TROPONINI" in the last 72 hours. BNP: Invalid input(s): "POCBNP" D-Dimer: No results for input(s): "DDIMER" in the last 72 hours. Hemoglobin A1C: No results for input(s): "HGBA1C" in the last 72 hours. Fasting Lipid Panel: No results for input(s): "CHOL", "HDL", "LDLCALC", "TRIG", "CHOLHDL", "LDLDIRECT" in the last 72 hours. Thyroid Function Tests: Recent Labs    05/05/22 0502  TSH 0.677   Anemia Panel: No results for input(s): "VITAMINB12", "FOLATE", "FERRITIN", "TIBC", "IRON", "RETICCTPCT" in the last 72 hours.   PHYSICAL EXAM General: Well developed, well nourished, in no acute distress HEENT:  Normocephalic and atramatic Neck:  No JVD.  Lungs: Clear bilaterally to auscultation and percussion. Heart: HRRR .  Normal S1 and S2 without gallops or murmurs.  Abdomen: Bowel sounds are positive, abdomen soft and non-tender  Msk:  Back normal, normal gait. Normal strength and tone for age. Extremities: No clubbing, cyanosis or edema.   Neuro: Alert and oriented X 3. Psych:  Good affect, responds appropriately  TELEMETRY: Atrial fibrillation with ventricular rate 118   ASSESSMENT AND PLAN: Patient presented with community-acquired pneumonia along with atrial fibrillation with rapid ventricular response rate and shortness of breath most likely due to HFpEF.  Amiodarone drip was discontinued due to hypotension and slow ventricular rate A-fib.  This morning her heart rate is back to 118 and blood pressure is reasonable thus I will restart IV amiodarone drip.  Agree with giving Lasix intermittently IV.   ICD-10-CM   1. Chest pain, unspecified type  R07.9     2. Atrial fibrillation with rapid ventricular response  I48.91     3. Community acquired pneumonia, unspecified laterality  J18.9     4. AKI (acute kidney injury)  N17.9       Principal Problem:   CAP (community acquired pneumonia) Active Problems:   HLD (hyperlipidemia)   Chronic atrial fibrillation with RVR   HTN (hypertension)   Chronic diastolic CHF (congestive heart failure)  Chronic kidney disease, stage 3a   Chronic pain   Asthma   Normocytic anemia   Sepsis    Molly Blackwater, MD, [facility] 05/06/2022 11:12 AM

## 2022-05-07 DIAGNOSIS — I482 Chronic atrial fibrillation, unspecified: Secondary | ICD-10-CM | POA: Diagnosis not present

## 2022-05-07 DIAGNOSIS — J4520 Mild intermittent asthma, uncomplicated: Secondary | ICD-10-CM | POA: Diagnosis not present

## 2022-05-07 DIAGNOSIS — I10 Essential (primary) hypertension: Secondary | ICD-10-CM | POA: Diagnosis not present

## 2022-05-07 DIAGNOSIS — A419 Sepsis, unspecified organism: Secondary | ICD-10-CM | POA: Diagnosis not present

## 2022-05-07 DIAGNOSIS — J189 Pneumonia, unspecified organism: Secondary | ICD-10-CM | POA: Diagnosis not present

## 2022-05-07 LAB — BASIC METABOLIC PANEL
Anion gap: 12 (ref 5–15)
BUN: 40 mg/dL — ABNORMAL HIGH (ref 8–23)
CO2: 24 mmol/L (ref 22–32)
Calcium: 8.3 mg/dL — ABNORMAL LOW (ref 8.9–10.3)
Chloride: 100 mmol/L (ref 98–111)
Creatinine, Ser: 1.21 mg/dL — ABNORMAL HIGH (ref 0.44–1.00)
GFR, Estimated: 46 mL/min — ABNORMAL LOW (ref >60)
Glucose, Bld: 105 mg/dL — ABNORMAL HIGH (ref 70–99)
Potassium: 3.5 mmol/L (ref 3.5–5.1)
Sodium: 136 mmol/L (ref 135–145)

## 2022-05-07 LAB — CULTURE, BLOOD (ROUTINE X 2): Special Requests: ADEQUATE

## 2022-05-07 LAB — CBC
HCT: 30.7 % — ABNORMAL LOW (ref 36.0–46.0)
Hemoglobin: 9.7 g/dL — ABNORMAL LOW (ref 12.0–15.0)
MCH: 29 pg (ref 26.0–34.0)
MCHC: 31.6 g/dL (ref 30.0–36.0)
MCV: 91.6 fL (ref 80.0–100.0)
Platelets: 407 10*3/uL — ABNORMAL HIGH (ref 150–400)
RBC: 3.35 MIL/uL — ABNORMAL LOW (ref 3.87–5.11)
RDW: 14.9 % (ref 11.5–15.5)
WBC: 10.2 10*3/uL (ref 4.0–10.5)
nRBC: 0 % (ref 0.0–0.2)

## 2022-05-07 MED ORDER — METOPROLOL TARTRATE 5 MG/5ML IV SOLN
5.0000 mg | Freq: Once | INTRAVENOUS | Status: AC
  Administered 2022-05-07: 5 mg via INTRAVENOUS
  Filled 2022-05-07: qty 5

## 2022-05-07 MED ORDER — LACTATED RINGERS IV BOLUS
500.0000 mL | Freq: Once | INTRAVENOUS | Status: AC
  Administered 2022-05-07: 500 mL via INTRAVENOUS

## 2022-05-07 MED ORDER — MIDODRINE HCL 5 MG PO TABS
5.0000 mg | ORAL_TABLET | Freq: Three times a day (TID) | ORAL | Status: DC
  Administered 2022-05-07 – 2022-05-11 (×11): 5 mg via ORAL
  Filled 2022-05-07 (×14): qty 1

## 2022-05-07 NOTE — Progress Notes (Signed)
       CROSS COVER NOTE  NAME: AMAIA LAVALLIE MRN: [id number] DOB : Oct 05, 1945 ATTENDING PHYSICIAN: Sunnie Nielsen, DO    Date of Service   05/07/2022   HPI/Events of Note   Report/Request ***"135/87 (93) 95% on 3L, HR 146. Pt is here with PNA, has severe anxiety. Has been in a-fib but rate was relatively controlled until approximately 2140. It would occasionally bounce up to 120 or 130 but it never sustained until tonight " On Review of chart *** Bedside eval*** HPI***  Interventions   Assessment/Plan: 500 mL LR bolus, d/c gtt? *** X X    *** professional thanks      To reach the provider On-Call:   7AM- 7PM see care teams to locate the attending and reach out to them via [email or web address] Password: [email or web address] 7PM-7AM contact night-coverage If you still have difficulty reaching the appropriate provider, please page the [facility] (Director on Call) for [facility] on amion for assistance  This document was prepared using Conservation officer, historic buildings and may include unintentional dictation errors.  Bishop Limbo DNP, MBA, FNP-BC, PMHNP-BC Nurse Practitioner [facility] Pager [phone]

## 2022-05-07 NOTE — Progress Notes (Signed)
SUBJECTIVE: Molly Mitchell is a 77 y.o. female with medical history significant of A-fib on Eliquis, s/p of Watchman left atrial appendage closure device placement on 04/20/23, hypertension, hyperlipidemia, asthma, diastolic CHF, stroke, CKD-3A, chronic pain, carotid artery stenosis, who presented to the ED from home on 05/03/2022 with cough, shortness of breath, chest pain x2 days.   Patient found to have CAP and atrial fibrillation with RVR.   Vitals:   05/07/22 0145 05/07/22 0324 05/07/22 0745 05/07/22 1145  BP: (!) 96/57 110/63    Pulse: 90 86    Resp: 14 (!) 24    Temp:  98.2 F (36.8 C) 98.4 F (36.9 C) 97.7 F (36.5 C)  TempSrc:  Oral Oral Oral  SpO2: 99% 99%    Weight:  70 kg    Height:        Intake/Output Summary (Last 24 hours) at 05/07/2022 1525 Last data filed at 05/07/2022 0600 Gross per 24 hour  Intake 566.83 ml  Output --  Net 566.83 ml    LABS: Basic Metabolic Panel: Recent Labs    05/06/22 0435 05/07/22 0520  NA 136 136  K 4.1 3.5  CL 102 100  CO2 22 24  GLUCOSE 110* 105*  BUN 45* 40*  CREATININE 1.53* 1.21*  CALCIUM 8.0* 8.3*   Liver Function Tests: Recent Labs    05/05/22 0502  AST 20  ALT 32  ALKPHOS 248*  BILITOT 0.4  PROT 7.4  ALBUMIN 3.3*   No results for input(s): "LIPASE", "AMYLASE" in the last 72 hours. CBC: Recent Labs    05/05/22 0502 05/07/22 0520  WBC 19.4* 10.2  HGB 10.2* 9.7*  HCT 31.7* 30.7*  MCV 90.6 91.6  PLT 490* 407*   Cardiac Enzymes: No results for input(s): "CKTOTAL", "CKMB", "CKMBINDEX", "TROPONINI" in the last 72 hours. BNP: Invalid input(s): "POCBNP" D-Dimer: No results for input(s): "DDIMER" in the last 72 hours. Hemoglobin A1C: No results for input(s): "HGBA1C" in the last 72 hours. Fasting Lipid Panel: No results for input(s): "CHOL", "HDL", "LDLCALC", "TRIG", "CHOLHDL", "LDLDIRECT" in the last 72 hours. Thyroid Function Tests: Recent Labs    05/05/22 0502  TSH 0.677   Anemia Panel: No results  for input(s): "VITAMINB12", "FOLATE", "FERRITIN", "TIBC", "IRON", "RETICCTPCT" in the last 72 hours.   PHYSICAL EXAM General: Well developed, well nourished, in no acute distress HEENT:  Normocephalic and atramatic Neck:  No JVD.  Lungs: Clear bilaterally to auscultation and percussion. Heart: HRRR . Normal S1 and S2 without gallops or murmurs.  Abdomen: Bowel sounds are positive, abdomen soft and non-tender  Msk:  Back normal, normal gait. Normal strength and tone for age. Extremities: No clubbing, cyanosis or edema.   Neuro: Alert and oriented X 3. Psych:  Good affect, responds appropriately  TELEMETRY: atrial fibrillation, HR 121 bpm  ASSESSMENT AND PLAN: Patient resting in bed, states she is feeling better than yesterday. Patient is anxious, tearful, states she is ready to go home. Denies chest pain. Shortness of breath improving. HR continues to be elevated despite amiodarone infusion, d/t atrial fibrillation vs CAP . Patient on antibiotics for CAP. Will continue to follow.     ICD-10-CM   1. Chest pain, unspecified type  R07.9     2. Atrial fibrillation with rapid ventricular response  I48.91     3. Community acquired pneumonia, unspecified laterality  J18.9     4. AKI (acute kidney injury)  N17.9       Principal Problem:   CAP (community  acquired pneumonia) Active Problems:   HLD (hyperlipidemia)   Chronic atrial fibrillation with RVR   HTN (hypertension)   Chronic diastolic CHF (congestive heart failure)   Chronic kidney disease, stage 3a   Chronic pain   Asthma   Normocytic anemia   Sepsis    Adyan Palau, FNP-C 05/07/2022 3:25 PM

## 2022-05-07 NOTE — Progress Notes (Signed)
PROGRESS NOTE    XIMENNA FONSECA   WJX:[id number] DOB: 02-Aug-1945  DOA: 04/25/2022 Date of Service: 05/07/22 PCP: [facility]     Brief Narrative / Hospital Course:  DOREE KUEHNE is a 77 y.o. female with medical history significant of A-fib on Eliquis, s/p of Watchman left atrial appendage closure device placement on 04/20/23, hypertension, hyperlipidemia, asthma, diastolic CHF, stroke, CKD-3A, chronic pain, carotid artery stenosis, who presents to the ED from home 05/16/2022 with cough, shortness of breath, chest pain x2 days. Patient recently had Watchman left atrial appendage closure device placement on 04/20/23.  04/17: atrial fibrillation with RVR, heart rate up to 137.  Patient was given 10 mg of Cardizem with temporary improved heart rate, but heart rates increased again, and started on cardizem drip in ED. Negative PCR for COVID, flu and RSV, WBC 13.7, lactic acid 1.3, procalcitonin 0.19. Urinalysis (cloudy appearance, small amount of leukocyte, negative nitrite, WBC 0-5 with squamous cell contamination 21-50), slightly worsening renal function, BNP 716, trop 6 --> 5. Temperature normal, blood pressure 102/74, RR 25-37, oxygen saturation 97% on room air. Pt is admitted PCU as inpt. Tx for Afib RVR and sepsis d/t CAP. Hr initially improved to 80s. 04/18: significantly anxious, became over sedated w/ valium, low BP, improved in afternoon. Echo reviewed from Watchman device placement - TEE 04/20/22 LVEF 55-60% normal LV and RV fxn, LA mod dilated. Cardiology to adjust rate/rhythm control --> d/c dilt gtt, start po diltiazem, change coreg to metoprolol 12.5 mg po q6h, started amiodarone bolus/gtt. Remained very anxious - alprazolam lower dose administered.  04/19:  converted to sinus rhythm about mid-day. Concern for worsening infiltrate on CXR, add azithromycin, consider repeat CXR in AM. 04/20: amio gtt d/c overnight d/t hypotension/bradycardia. BP and HR normalized. CXR personally  reviewed - infiltrate worse on L appears more c/w edema/vascular congestion, trial another dose lasix 40 mg IV diuresis. RVR recurred, restarted amiodarone gtt lower dise since had po amio also, BP a bit low so holding metoprolol po for now. Cardiology following. Echo ordered.  04/21: Echo reviewed - EF 60-65%, indeterminate diastolic parameters, enlarged RV, moderately dilated LA and RA, small pericardial effusion w/o tamponade. This morning still 4L/min [place], BP soft. Maintain amio gtt, added midodrine rather than add fluids. Cardiology following, appreciate any further recs.   Consultants:  Cardiology   Procedures: None       ASSESSMENT & PLAN:   Principal Problem:   CAP (community acquired pneumonia) Active Problems:   Sepsis   Chronic diastolic CHF (congestive heart failure)   Chronic atrial fibrillation with RVR   Asthma   HTN (hypertension)   Chronic pain   HLD (hyperlipidemia)   Chronic kidney disease, stage 3a   Normocytic anemia  Sepsis due to CAP (community acquired pneumonia)  vs SIRS d/t Afib RVR and anxiety  Elevated HR and RR can be either/both etiology Met criteria with WBC 13.6, heart rate up to 137 and RR 25-37.  Lactic acid normal 1.3.  Procalcitonin 0.19.   Slightly worsening renal function on admission with creatinine 1.23, BUN 42, GFR 46 (recent baseline creatinine 1.14 on 03/22/2022).    Urine legionella and S. pneumococcal antigen - neg Treat as below   Community Acquired Pneumonia  IV Rocephin and doxycycline to cover PNA, changed doxy to azithromycin Mucinex for cough  Bronchodilators CXR prn  Chronic atrial fibrillation with acute RVR:  Heart rate up to 137 --> 80s --> 130s again Amiodarone gtt per cardiology -->  was briefly held d/t low BP/HR then restarted d/t recurrence RVR  start PO amio 200mg  BID for 14 days, then 200mg  daily thereafter.  holding metoprolol for now  Eliquis 5 mg po bid --> continue eliquis 5mg  BID x 45 days following Watchman  as per EP  Cardiology following, appreciate assistance    Acute hypoxic respiratory failure  Supplemental O2 as needed   Chronic diastolic CHF (congestive heart failure), w/ exacerbation  2D echo on 04/20/2022 showed EF of 55-60%.  S/p lasix, which seems to have helped respiratory status  Strict I&O ordered but no significant output has been documented   Severe anxiety Sensitivity to benzodiazepine (oversedation w/ Valium)  Scheduled BuSpar Alprazolam 0.25 mg tid prn  Asthma Bronchodilators   HTN (hypertension): IV hydralazine as needed Meds as above    Chronic pain Continue as needed Norco, As needed Tylenol   HLD (hyperlipidemia) Pravastatin   AKI on Chronic kidney disease, stage 3a:  Slightly worsening renal function.   Baseline creatinine 1.14 on 03/22/2022 --> 1.23 on admission.  BUN 42, GFR 46 Monitor BMP   Normocytic anemia:  Hemoglobin 10.5 (12.1 on 03/22/2022), no active bleeding. Follow-up with CBC   DVT prophylaxis: Eliquis Pertinent IV fluids/nutrition: no continuous IV fluids at this time  Central lines / invasive devices: none  Code Status: FULL CODE ACP documents reviewed: none on file   Current Admission Status: inpatient, progressive unit d/t amio gtt   TOC needs / Dispo plan: anticipate d/c to previous home environment, TBD Barriers to discharge / significant pending items: pending clinical improvement expect will be here 1-2 more days              Subjective / Brief ROS:  Patient is alert, reports anxiety a bit better today, reports SOB is better, no chest pain Pain controlled.  Denies new weakness.    Family Communication: husband at bedside on rounds     Objective Findings:  Vitals:   05/07/22 0145 05/07/22 0324 05/07/22 0745 05/07/22 1145  BP: (!) 96/57 110/63    Pulse: 90 86    Resp: 14 (!) 24    Temp:  98.2 F (36.8 C) 98.4 F (36.9 C) 97.7 F (36.5 C)  TempSrc:  Oral Oral Oral  SpO2: 99% 99%    Weight:  70 kg     Height:        Intake/Output Summary (Last 24 hours) at 05/07/2022 1221 Last data filed at 05/07/2022 0600 Gross per 24 hour  Intake 566.83 ml  Output 300 ml  Net 266.83 ml    Filed Weights   05/05/22 0447 05/06/22 1000 05/07/22 0324  Weight: 68.6 kg 68 kg 70 kg    Examination:  Physical Exam Constitutional:      Appearance: She is not toxic-appearing.  Cardiovascular:     Rate and Rhythm: Tachycardia present. Rhythm irregular.     Heart sounds: Murmur heard.     Systolic murmur is present.  Pulmonary:     Effort: No tachypnea, accessory muscle usage or respiratory distress.     Breath sounds: Examination of the right-lower field reveals rales. Examination of the left-lower field reveals rales. Rales present.  Musculoskeletal:     Right lower leg: No edema.     Left lower leg: No edema.  Skin:    General: Skin is warm and dry.  Neurological:     General: No focal deficit present.     Mental Status: She is alert and oriented to person,  place, and time.  Psychiatric:        Mood and Affect: Mood is anxious.          Scheduled Medications:   apixaban  5 mg Oral BID   midodrine  5 mg Oral TID WC   pravastatin  40 mg Oral QHS   tiZANidine  4 mg Oral BID    Continuous Infusions:  amiodarone 30 mg/hr (05/07/22 0444)   azithromycin Stopped (05/06/22 1642)   cefTRIAXone (ROCEPHIN)  IV 2 g (05/07/22 1147)    PRN Medications:  acetaminophen, albuterol, ALPRAZolam, busPIRone, dextromethorphan-guaiFENesin, HYDROcodone-acetaminophen, ondansetron (ZOFRAN) IV  Antimicrobials from admission:  Anti-infectives (From admission, onward)    Start     Dose/Rate Route Frequency Ordered Stop   05/05/22 1600  azithromycin (ZITHROMAX) 500 mg in sodium chloride 0.9 % 250 mL IVPB        500 mg 250 mL/hr over 60 Minutes Intravenous Every 24 hours 05/05/22 1515 05/10/22 1559   04/18/2022 1900  doxycycline (VIBRA-TABS) tablet 100 mg  Status:  Discontinued        100 mg Oral Every  12 hours 04/30/2022 1129 05/07/22 1219   04/21/2022 1200  cefTRIAXone (ROCEPHIN) 2 g in sodium chloride 0.9 % 100 mL IVPB        2 g 200 mL/hr over 30 Minutes Intravenous Every 24 hours 04/17/2022 1129 05/10/22 1159   04/28/2022 0600  doxycycline (VIBRAMYCIN) 100 mg in sodium chloride 0.9 % 250 mL IVPB        100 mg 125 mL/hr over 120 Minutes Intravenous  Once 04/22/2022 0459 04/22/2022 0810           Data Reviewed:  I have personally reviewed the following...  CBC: Recent Labs  Lab 04/25/2022 0329 05/04/22 0418 05/05/22 0502 05/07/22 0520  WBC 13.6* 13.6* 19.4* 10.2  NEUTROABS 9.9*  --   --   --   HGB 10.5* 10.1* 10.2* 9.7*  HCT 32.7* 31.4* 31.7* 30.7*  MCV 90.8 91.5 90.6 91.6  PLT 455* 419* 490* 407*   Basic Metabolic Panel: Recent Labs  Lab 05/11/2022 0329 05/04/22 0418 05/05/22 0502 05/06/22 0435 05/07/22 0520  NA 134* 137 137 136 136  K 4.9 4.6 4.5 4.1 3.5  CL 102 104 101 102 100  CO2 24 23 22 22 24   GLUCOSE 140* 145* 137* 110* 105*  BUN 42* 35* 45* 45* 40*  CREATININE 1.23* 1.19* 1.57* 1.53* 1.21*  CALCIUM 9.0 8.7* 8.7* 8.0* 8.3*  MG  --  2.5*  --   --   --    GFR: Estimated Creatinine Clearance: 36.3 mL/min (A) (by C-G formula based on SCr of 1.21 mg/dL (H)). Liver Function Tests: Recent Labs  Lab 05/04/2022 0329 05/05/22 0502  AST 58* 20  ALT 55* 32  ALKPHOS 312* 248*  BILITOT 0.6 0.4  PROT 7.6 7.4  ALBUMIN 3.4* 3.3*   Recent Labs  Lab 05/13/2022 0329  LIPASE 26   No results for input(s): "AMMONIA" in the last 168 hours. Coagulation Profile: No results for input(s): "INR", "PROTIME" in the last 168 hours. Cardiac Enzymes: No results for input(s): "CKTOTAL", "CKMB", "CKMBINDEX", "TROPONINI" in the last 168 hours. BNP (last 3 results) No results for input(s): "PROBNP" in the last 8760 hours. HbA1C: No results for input(s): "HGBA1C" in the last 72 hours. CBG: No results for input(s): "GLUCAP" in the last 168 hours. Lipid Profile: No results for  input(s): "CHOL", "HDL", "LDLCALC", "TRIG", "CHOLHDL", "LDLDIRECT" in the last 72 hours. Thyroid Function  Tests: Recent Labs    05/05/22 0502  TSH 0.677   Anemia Panel: No results for input(s): "VITAMINB12", "FOLATE", "FERRITIN", "TIBC", "IRON", "RETICCTPCT" in the last 72 hours. Most Recent Urinalysis On File:     Component Value Date/Time   COLORURINE YELLOW (A) 04/19/2022 0811   APPEARANCEUR CLOUDY (A) 04/24/2022 0811   APPEARANCEUR Clear 02/19/2013 0916   LABSPEC 1.018 04/30/2022 0811   LABSPEC 1.010 02/19/2013 0916   PHURINE 5.0 04/29/2022 0811   GLUCOSEU NEGATIVE 05/14/2022 0811   GLUCOSEU Negative 02/19/2013 0916   HGBUR NEGATIVE 05/10/2022 0811   BILIRUBINUR NEGATIVE 04/26/2022 0811   BILIRUBINUR negative 10/23/2017 1324   BILIRUBINUR Negative 02/19/2013 0916   KETONESUR NEGATIVE 04/25/2022 0811   PROTEINUR 30 (A) 05/09/2022 0811   UROBILINOGEN 0.2 10/23/2017 1324   NITRITE NEGATIVE 05/12/2022 0811   LEUKOCYTESUR SMALL (A) 05/06/2022 0811   LEUKOCYTESUR Negative 02/19/2013 0916   Sepsis Labs: @LABRCNTIP (procalcitonin:4,lacticidven:4) Microbiology: Recent Results (from the past 240 hour(s))  Culture, blood (routine x 2)     Status: None (Preliminary result)   Collection Time: 04/23/2022  5:37 AM   Specimen: BLOOD RIGHT ARM  Result Value Ref Range Status   Specimen Description BLOOD RIGHT ARM  Final   Special Requests   Final    BOTTLES DRAWN AEROBIC AND ANAEROBIC Blood Culture adequate volume   Culture   Final    NO GROWTH 4 DAYS Performed at [facility], [address], [place], [place] [address]    Report Status PENDING  Incomplete  Resp panel by RT-PCR (RSV, Flu A&B, Covid) Anterior Nasal Swab     Status: None   Collection Time: 04/30/2022  5:37 AM   Specimen: Anterior Nasal Swab  Result Value Ref Range Status   SARS Coronavirus 2 by RT PCR NEGATIVE NEGATIVE Final    Comment: (NOTE) SARS-CoV-2 target nucleic acids are NOT DETECTED.  The  SARS-CoV-2 RNA is generally detectable in upper respiratory specimens during the acute phase of infection. The lowest concentration of SARS-CoV-2 viral copies this assay can detect is 138 copies/mL. A negative result does not preclude SARS-Cov-2 infection and should not be used as the sole basis for treatment or other patient management decisions. A negative result may occur with  improper specimen collection/handling, submission of specimen other than nasopharyngeal swab, presence of viral mutation(s) within the areas targeted by this assay, and inadequate number of viral copies(<138 copies/mL). A negative result must be combined with clinical observations, patient history, and epidemiological information. The expected result is Negative.  Fact Sheet for Patients:  BloggerCourse.com  Fact Sheet for Healthcare Providers:  SeriousBroker.it  This test is no t yet approved or cleared by the [place] FDA and  has been authorized for detection and/or diagnosis of SARS-CoV-2 by FDA under an Emergency Use Authorization (EUA). This EUA will remain  in effect (meaning this test can be used) for the duration of the COVID-19 declaration under Section 564(b)(1) of the Act, 21 U.S.C.section 360bbb-3(b)(1), unless the authorization is terminated  or revoked sooner.       Influenza A by PCR NEGATIVE NEGATIVE Final   Influenza B by PCR NEGATIVE NEGATIVE Final    Comment: (NOTE) The Xpert Xpress SARS-CoV-2/FLU/RSV plus assay is intended as an aid in the diagnosis of influenza from Nasopharyngeal swab specimens and should not be used as a sole basis for treatment. Nasal washings and aspirates are unacceptable for Xpert Xpress SARS-CoV-2/FLU/RSV testing.  Fact Sheet for Patients: BloggerCourse.com  Fact Sheet for Healthcare  Providers: SeriousBroker.it  This test is not yet approved or  cleared by the [place] and has been authorized for detection and/or diagnosis of SARS-CoV-2 by FDA under an Emergency Use Authorization (EUA). This EUA will remain in effect (meaning this test can be used) for the duration of the COVID-19 declaration under Section 564(b)(1) of the Act, 21 U.S.C. section 360bbb-3(b)(1), unless the authorization is terminated or revoked.     Resp Syncytial Virus by PCR NEGATIVE NEGATIVE Final    Comment: (NOTE) Fact Sheet for Patients: BloggerCourse.com  Fact Sheet for Healthcare Providers: SeriousBroker.it  This test is not yet approved or cleared by the [place] FDA and has been authorized for detection and/or diagnosis of SARS-CoV-2 by FDA under an Emergency Use Authorization (EUA). This EUA will remain in effect (meaning this test can be used) for the duration of the COVID-19 declaration under Section 564(b)(1) of the Act, 21 U.S.C. section 360bbb-3(b)(1), unless the authorization is terminated or revoked.  Performed at [facility], [address] [place], [place] [address]   Culture, blood (routine x 2)     Status: None (Preliminary result)   Collection Time: 05/04/22  2:54 PM   Specimen: BLOOD  Result Value Ref Range Status   Specimen Description BLOOD BLOOD LEFT HAND  Final   Special Requests   Final    BOTTLES DRAWN AEROBIC AND ANAEROBIC Blood Culture adequate volume   Culture   Final    NO GROWTH 3 DAYS Performed at [facility], [address], [place], [place] [address]    Report Status PENDING  Incomplete      Radiology Studies last 3 days: ECHOCARDIOGRAM COMPLETE  Result Date: 05/06/2022    ECHOCARDIOGRAM REPORT   Patient Name:   MAKILAH DOWDA Date of Exam: 05/06/2022 Medical Rec #:  [id number]      Height:       62.0 in Accession #:    [id number]     Weight:       149.9 lb Date of Birth:  1945-07-23       BSA:          1.691 m Patient Age:     77 years       BP:           95/65 mmHg Patient Gender: F              HR:           107 bpm. Exam Location:  [facility] Procedure: 2D Echo Indications:     Atrial Fibrillation I48.91  History:         Patient has prior history of Echocardiogram examinations, most                  recent 04/20/2022.  Sonographer:     Overton Mam RDCS, FASE Referring Phys:  Lajuana Carry A KHAN Diagnosing Phys: Adrian Blackwater  Sonographer Comments: Technically difficult study due to poor echo windows. Image acquisition challenging due to respiratory motion. The patient had coughing spells throughout this study. IMPRESSIONS  1. Left ventricular ejection fraction, by estimation, is 60 to 65%. The left ventricle has normal function. The left ventricle has no regional wall motion abnormalities. There is mild concentric left ventricular hypertrophy. Left ventricular diastolic parameters are indeterminate.  2. Right ventricular systolic function is normal. The right ventricular size is moderately enlarged.  3. Left atrial size was moderately dilated.  4. Right atrial size was moderately dilated.  5.  A small pericardial effusion is present. The pericardial effusion is circumferential. There is no evidence of cardiac tamponade.  6. The mitral valve is normal in structure. Mild mitral valve regurgitation. No evidence of mitral stenosis.  7. The aortic valve is normal in structure. Aortic valve regurgitation is not visualized. No aortic stenosis is present.  8. The inferior vena cava is normal in size with greater than 50% respiratory variability, suggesting right atrial pressure of 3 mmHg. FINDINGS  Left Ventricle: Left ventricular ejection fraction, by estimation, is 60 to 65%. The left ventricle has normal function. The left ventricle has no regional wall motion abnormalities. The left ventricular internal cavity size was normal in size. There is  mild concentric left ventricular hypertrophy. Left ventricular diastolic parameters are  indeterminate. Right Ventricle: The right ventricular size is moderately enlarged. No increase in right ventricular wall thickness. Right ventricular systolic function is normal. Left Atrium: Left atrial size was moderately dilated. Right Atrium: Right atrial size was moderately dilated. Pericardium: A small pericardial effusion is present. The pericardial effusion is circumferential. There is no evidence of cardiac tamponade. Mitral Valve: The mitral valve is normal in structure. Mild mitral valve regurgitation. No evidence of mitral valve stenosis. Tricuspid Valve: The tricuspid valve is normal in structure. Tricuspid valve regurgitation is mild . No evidence of tricuspid stenosis. Aortic Valve: The aortic valve is normal in structure. Aortic valve regurgitation is not visualized. No aortic stenosis is present. Aortic valve peak gradient measures 10.9 mmHg. Pulmonic Valve: The pulmonic valve was normal in structure. Pulmonic valve regurgitation is not visualized. No evidence of pulmonic stenosis. Aorta: The aortic root is normal in size and structure. Venous: The inferior vena cava is normal in size with greater than 50% respiratory variability, suggesting right atrial pressure of 3 mmHg. IAS/Shunts: No atrial level shunt detected by color flow Doppler.  LEFT VENTRICLE PLAX 2D LVIDd:         3.80 cm   Diastology LVIDs:         2.70 cm   LV e' medial:    13.40 cm/s LV PW:         1.00 cm   LV E/e' medial:  6.7 LV IVS:        1.00 cm   LV e' lateral:   8.27 cm/s LVOT diam:     1.80 cm   LV E/e' lateral: 10.8 LV SV:         58 LV SV Index:   34 LVOT Area:     2.54 cm  RIGHT VENTRICLE RV Basal diam:  2.80 cm RV S prime:     12.10 cm/s TAPSE (M-mode): 1.0 cm LEFT ATRIUM           Index        RIGHT ATRIUM           Index LA diam:      3.60 cm 2.13 cm/m   RA Area:     16.20 cm LA Vol (A4C): 30.2 ml 17.86 ml/m  RA Volume:   45.30 ml  26.78 ml/m  AORTIC VALVE                 PULMONIC VALVE AV Area (Vmax): 1.77 cm      PV Vmax:        1.04 m/s AV Vmax:        165.00 cm/s  PV Peak grad:   4.3 mmHg AV Peak Grad:   10.9 mmHg  RVOT Peak grad: 3 mmHg LVOT Vmax:      115.00 cm/s LVOT Vmean:     75.800 cm/s LVOT VTI:       0.226 m  AORTA Ao Root diam: 3.00 cm Ao Asc diam:  3.20 cm MITRAL VALVE               TRICUSPID VALVE MV Area (PHT): 2.94 cm    TR Peak grad:   22.3 mmHg MV Decel Time: 258 msec    TR Vmax:        236.00 cm/s MV E velocity: 89.60 cm/s                            SHUNTS                            Systemic VTI:  0.23 m                            Systemic Diam: 1.80 cm Adrian Blackwater Electronically signed by Adrian Blackwater Signature Date/Time: 05/06/2022/11:04:36 PM    Final    DG Chest Port 1 View  Result Date: 05/06/2022 CLINICAL DATA:  Shortness of breath EXAM: PORTABLE CHEST 1 VIEW COMPARISON:  05/05/2018 FINDINGS: Cardiac shadow is enlarged but stable. Watchman device is again seen. Postsurgical changes in the thoracolumbar spine are noted. Slight increased central vascular congestion is noted with mild interstitial edema. Small effusions are noted bilaterally. IMPRESSION: Slight increase in vascular congestion with mild interstitial edema. Small effusions are again noted. Electronically Signed   By: Alcide Clever M.D.   On: 05/06/2022 09:29   DG Chest Port 1 View  Result Date: 05/05/2022 CLINICAL DATA:  Shortness of breath. EXAM: PORTABLE CHEST 1 VIEW COMPARISON:  04/24/2022. FINDINGS: Increasing bibasilar opacities, left-greater-than-right, suspicious for aspiration or multifocal pneumonia. Unchanged small left pleural effusion. Stable cardiac and mediastinal contours. No pneumothorax. IMPRESSION: Increasing bibasilar opacities, greater on the left, suspicious for aspiration or multifocal pneumonia. Electronically Signed   By: Orvan Falconer M.D.   On: 05/05/2022 13:20             LOS: 4 days    Time spent: 50 min    Sunnie Nielsen, DO [facility] 05/07/2022, 12:21 PM     Dictation software may have been used to generate the above note. Typos may occur and escape review in typed/dictated notes. Please contact Dr Lyn Hollingshead directly for clarity if needed.  Staff may message me via secure chat in Epic  but this may not receive an immediate response,  please page me for urgent matters!  If 7PM-7AM, please contact night coverage [email or web address]

## 2022-05-08 DIAGNOSIS — J4520 Mild intermittent asthma, uncomplicated: Secondary | ICD-10-CM | POA: Diagnosis not present

## 2022-05-08 DIAGNOSIS — J189 Pneumonia, unspecified organism: Secondary | ICD-10-CM | POA: Diagnosis not present

## 2022-05-08 DIAGNOSIS — A419 Sepsis, unspecified organism: Secondary | ICD-10-CM | POA: Diagnosis not present

## 2022-05-08 DIAGNOSIS — I10 Essential (primary) hypertension: Secondary | ICD-10-CM | POA: Diagnosis not present

## 2022-05-08 LAB — BASIC METABOLIC PANEL
Anion gap: 10 (ref 5–15)
BUN: 36 mg/dL — ABNORMAL HIGH (ref 8–23)
CO2: 25 mmol/L (ref 22–32)
Calcium: 8.4 mg/dL — ABNORMAL LOW (ref 8.9–10.3)
Chloride: 101 mmol/L (ref 98–111)
Creatinine, Ser: 1.32 mg/dL — ABNORMAL HIGH (ref 0.44–1.00)
GFR, Estimated: 42 mL/min — ABNORMAL LOW (ref >60)
Glucose, Bld: 117 mg/dL — ABNORMAL HIGH (ref 70–99)
Potassium: 3.8 mmol/L (ref 3.5–5.1)
Sodium: 136 mmol/L (ref 135–145)

## 2022-05-08 LAB — CULTURE, BLOOD (ROUTINE X 2)
Culture: NO GROWTH
Culture: NO GROWTH

## 2022-05-08 MED ORDER — METOPROLOL TARTRATE 25 MG PO TABS
12.5000 mg | ORAL_TABLET | Freq: Two times a day (BID) | ORAL | Status: DC
  Administered 2022-05-09: 12.5 mg via ORAL
  Filled 2022-05-08 (×2): qty 1

## 2022-05-08 MED ORDER — BUSPIRONE HCL 10 MG PO TABS
5.0000 mg | ORAL_TABLET | Freq: Three times a day (TID) | ORAL | Status: DC
  Administered 2022-05-08 – 2022-05-14 (×21): 5 mg via ORAL
  Filled 2022-05-08 (×21): qty 1

## 2022-05-08 MED ORDER — POTASSIUM CHLORIDE 20 MEQ PO PACK
40.0000 meq | PACK | Freq: Once | ORAL | Status: AC
  Administered 2022-05-08: 40 meq via ORAL
  Filled 2022-05-08: qty 2

## 2022-05-08 MED ORDER — HYDROCODONE-ACETAMINOPHEN 5-325 MG PO TABS
1.0000 | ORAL_TABLET | Freq: Four times a day (QID) | ORAL | Status: DC | PRN
  Administered 2022-05-08 – 2022-05-15 (×18): 1 via ORAL
  Filled 2022-05-08 (×19): qty 1

## 2022-05-08 MED ORDER — TIZANIDINE HCL 2 MG PO TABS
2.0000 mg | ORAL_TABLET | Freq: Three times a day (TID) | ORAL | Status: DC
  Administered 2022-05-08 – 2022-05-14 (×19): 2 mg via ORAL
  Filled 2022-05-08 (×21): qty 1

## 2022-05-08 MED ORDER — AZITHROMYCIN 250 MG PO TABS
500.0000 mg | ORAL_TABLET | Freq: Every day | ORAL | Status: AC
  Administered 2022-05-08 – 2022-05-09 (×2): 500 mg via ORAL
  Filled 2022-05-08 (×2): qty 2

## 2022-05-08 MED ORDER — FUROSEMIDE 10 MG/ML IJ SOLN
40.0000 mg | Freq: Once | INTRAMUSCULAR | Status: AC
  Administered 2022-05-08: 40 mg via INTRAVENOUS
  Filled 2022-05-08: qty 4

## 2022-05-08 MED ORDER — AMIODARONE HCL 200 MG PO TABS
200.0000 mg | ORAL_TABLET | Freq: Two times a day (BID) | ORAL | Status: DC
  Administered 2022-05-08 – 2022-05-09 (×4): 200 mg via ORAL
  Filled 2022-05-08 (×4): qty 1

## 2022-05-08 MED ORDER — AMIODARONE HCL 200 MG PO TABS: 200.0000 mg | ORAL_TABLET | Freq: Every day | ORAL | Status: DC

## 2022-05-08 NOTE — Progress Notes (Signed)
[facility] CARDIOLOGY CONSULT NOTE       Patient ID: Molly Mitchell MRN: [id number] DOB/AGE: [birth year] 77 y.o.  Admit date: 05/02/2022 Referring Physician Dr. Sunnie Nielsen  Primary Physician Dr. Nemiah Commander Primary Cardiologist Minda Ditto, PA-C  Reason for Consultation AF RVR  HPI: Molly Mitchell is a 77yoF with a PMH of paroxysmal AF s/p recent Watchman (LAAO closure) placement 4/4, HFpEF, right CEA (09/2021), s/p spinal fusion who presented to [facility] ED 04/30/2022 with cough, shortness of breath, and chest pain for 2 days. Initial concern for CAP, but symptoms ultimately felt to be more related to acute on chronic HFpEF and decompensation from atrial fibrillation with RVR.  Cardiology is consulted for further assistance.  Interval History:  - in and out of AF and NSR - very anxious and tearful, wants to go home  - mainly concerned about her breathing. No chest pain or palpitations   Past Medical History:  Diagnosis Date   Acute right MCA stroke 08/01/2021   a.) noted on MRI head 08/01/2021 --> punctate acute RIGHT MCA territory infarcts affecting the frontal and parietal contex consistent with microembolic infarcts in the RIGHT carotid circulation   Anemia    Arthritis    Bilateral carotid artery disease 08/18/2021   a.) CTA neck 08/18/2021: 70-80% RICA and 40% LICA   Cervicalgia    Chronic kidney disease    Chronic pain syndrome    Chronic, continuous use of opioids    a.) hydrocodone/APAP (Norco 10/325 mg)   Connective tissue disease    pt reports being diagnosed with undifferentiated connective tissue disease at some point- pt took steroids for this and has had no issues since- 10 years ago   Coronary artery disease    Cortical senile cataract    COVID 2021   DDD (degenerative disc disease), cervical    a.) s/p C4-C6 ACDF   DDD (degenerative disc disease), lumbar    a.) s/p laminectomy + posterior L2-S1 fusion   Dysrhythmia    A-fib   History of 2019 novel  coronavirus disease (COVID-19) 12/07/2018   History of blood transfusion    History of heart murmur in childhood    History of rheumatic fever    HLD (hyperlipidemia)    Hypertension    Lower extremity weakness    a.) with position changes from sitting to standing   Osteoporosis    Presence of Watchman left atrial appendage closure device 04/20/2022   27mm Watchman FLX placed by Dr. Lalla Brothers   Pseudogout    Shingles    Skin cancer of face    Spondylolisthesis of lumbar region     Past Surgical History:  Procedure Laterality Date   ABDOMINAL HYSTERECTOMY  1981   AIKEN OSTEOTOMY Right 01/30/2018   Procedure: WEIL RIGHT 2ND AND 3RD;  Surgeon: Gwyneth Revels, DPM;  Location: [facility];  Service: Podiatry;  Laterality: Right;   ANTERIOR CERVICAL DECOMP/DISCECTOMY FUSION N/A 2003   C4-C6   BACK SURGERY     pt reports hx of 3 lumber back surgeries total   CERVICAL SPINE SURGERY     pt reports having 3 cervical fusions total   ENDARTERECTOMY Right 09/21/2021   Procedure: ENDARTERECTOMY CAROTID;  Surgeon: Renford Dills, MD;  Location: [facility] OR[facility];  Service: Vascular;  Laterality: Right;  Right carotid   HAMMER TOE SURGERY Right 01/30/2018   Procedure: HAMMER TOE CORRECTION SECOND AND THIRD;  Surgeon: Gwyneth Revels, DPM;  Location: [facility];  Service: Podiatry;  Laterality: Right;  GENERAL WITH LOCAL   HAMMER TOE SURGERY Left 03/05/2019   Procedure: HAMMER TOE CORRECTION T1 AND T2;  Surgeon: Gwyneth Revels, DPM;  Location: [facility];  Service: Podiatry;  Laterality: Left;   LEFT ATRIAL APPENDAGE OCCLUSION N/A 04/20/2022   Procedure: LEFT ATRIAL APPENDAGE OCCLUSION;  Surgeon: Lanier Prude, MD;  Location: [facility];  Service: Cardiovascular;  Laterality: N/A;   POSTERIOR LUMBAR FUSION N/A 2001   L2-S1   ROTATOR CUFF REPAIR Right 1981   SACROPLASTY N/A 03/29/2017   S1   TEE WITHOUT CARDIOVERSION N/A 04/20/2022   Procedure: TRANSESOPHAGEAL  ECHOCARDIOGRAM;  Surgeon: Lanier Prude, MD;  Location: [facility] INVAS[facility] CV LAB;  Service: Cardiovascular;  Laterality: N/A;   TOTAL KNEE ARTHROPLASTY Left 03/03/2013   TOTAL KNEE REVISION Left 07/31/2016   Procedure: TOTAL KNEE REVISION;  Surgeon: Donato Heinz, MD;  Location: [facility] OR[facility];  Service: Orthopedics;  Laterality: Left;   WEIL OSTEOTOMY Left 03/05/2019   Procedure: WEIL OSTEOTOMY X 2 LEFT;  Surgeon: Gwyneth Revels, DPM;  Location: [facility];  Service: Podiatry;  Laterality: Left;    Medications Prior to Admission  Medication Sig Dispense Refill Last Dose   amLODipine (NORVASC) 2.5 MG tablet Take 2.5-5 mg by mouth See admin instructions. Take 2.5 mg in the morning and 5 mg at bedtime   Past Week   amoxicillin (AMOXIL) 500 MG capsule Take 4 tablets by mouth 1 hour prior to dental procedures. 12 capsule 3 unknown   apixaban (ELIQUIS) 5 MG TABS tablet Take 1 tablet (5 mg total) by mouth 2 (two) times daily. 60 tablet  05/02/2022   carvedilol (COREG) 3.125 MG tablet Take 3.125 mg by mouth 2 (two) times daily with a meal.   Past Week   HYDROcodone-acetaminophen (NORCO/VICODIN) 5-325 MG tablet Take 2 tablets by mouth 4 (four) times daily.   Past Week   pravastatin (PRAVACHOL) 40 MG tablet Take 40 mg by mouth at bedtime.   05/02/2022   telmisartan (MICARDIS) 80 MG tablet Take 80 mg by mouth daily.   Past Week   tiZANidine (ZANAFLEX) 4 MG tablet Take 1 tablet (4 mg total) by mouth 2 (two) times daily.   05/02/2022    Social History   Socioeconomic History   Marital status: Married    Spouse name: [name]   Number of children: 2   Years of education: Not on file   Highest education level: High school graduate  Occupational History   Occupation: retired   Occupation: Retired Engineer, [employer]  Tobacco Use   Smoking status: Former    Packs/day: 1.00    Years: 15.00    Additional pack years: 0.00    Total pack years: 15.00    Types: Cigarettes    Quit date:  04/17/1979    Years since quitting: 43.0   Smokeless tobacco: Never  Vaping Use   Vaping Use: Never used  Substance and Sexual Activity   Alcohol use: No    Alcohol/week: 0.0 standard drinks of alcohol   Drug use: No   Sexual activity: Not on file  Other Topics Concern   Not on file  Social History Narrative   Not on file   Social Determinants of Health   Financial Resource Strain: Low Risk  (03/23/2020)   Overall Financial Resource Strain (CARDIA)    Difficulty of Paying Living Expenses: Not hard at all  Food Insecurity: No Food Insecurity (05/04/2022)   Hunger Vital Sign  Worried About Programme researcher, broadcasting/film/video in the Last Year: Never true    Ran Out of Food in the Last Year: Never true  Transportation Needs: No Transportation Needs (05/04/2022)   PRAPARE - Administrator, Civil Service (Medical): No    Lack of Transportation (Non-Medical): No  Physical Activity: Sufficiently Active (03/23/2020)   Exercise Vital Sign    Days of Exercise per Week: 3 days    Minutes of Exercise per Session: 60 min  Stress: No Stress Concern Present (03/23/2020)   [employer] of Occupational Health - Occupational Stress Questionnaire    Feeling of Stress : Not at all  Social Connections: Socially Integrated (03/23/2020)   Social Connection and Isolation Panel [NHANES]    Frequency of Communication with Friends and Family: Never    Frequency of Social Gatherings with Friends and Family: More than three times a week    Attends Religious Services: More than 4 times per year    Active Member of Clubs or Organizations: Yes    Attends Banker Meetings: More than 4 times per year    Marital Status: Married  Catering manager Violence: Not At Risk (05/04/2022)   Humiliation, Afraid, Rape, and Kick questionnaire    Fear of Current or Ex-Partner: No    Emotionally Abused: No    Physically Abused: No    Sexually Abused: No    Family History  Problem Relation Age of Onset    Hypertension Brother    Hypertension Mother    Hypertension Sister    Cancer Sister    Diabetes Paternal Grandmother    Heart failure Maternal Grandmother       Intake/Output Summary (Last 24 hours) at 05/08/2022 1054 Last data filed at 05/08/2022 1049 Gross per 24 hour  Intake 443.33 ml  Output 300 ml  Net 143.33 ml     Vitals:   05/08/22 0105 05/08/22 0405 05/08/22 0500 05/08/22 0826  BP: 104/65 133/63  136/66  Pulse: 60 79  (!) 115  Resp: 15 15  20   Temp:  97.8 F (36.6 C)    TempSrc:  Oral  Oral  SpO2: 100% 95%  98%  Weight:   70.5 kg   Height:        PHYSICAL EXAM General: elderly caucasian female, laying at angle in bed, no family present HEENT:  Normocephalic and atraumatic. Neck:  No JVD.  Lungs: Normal respiratory effort on O2 by [place]. Crackles in left base Heart: tachy irregular irregular . Normal S1 and S2 without gallops or murmurs.  Abdomen: Non-distended appearing.  Msk: Normal strength and tone for age. Extremities: Warm and well perfused. No clubbing, cyanosis. Trace bilateral LE edema.  Neuro: alert and oriented  Psych:  anxious mood, tearful  Labs: Basic Metabolic Panel: Recent Labs    05/07/22 0520 05/08/22 0441  NA 136 136  K 3.5 3.8  CL 100 101  CO2 24 25  GLUCOSE 105* 117*  BUN 40* 36*  CREATININE 1.21* 1.32*  CALCIUM 8.3* 8.4*    Liver Function Tests: No results for input(s): "AST", "ALT", "ALKPHOS", "BILITOT", "PROT", "ALBUMIN" in the last 72 hours.  No results for input(s): "LIPASE", "AMYLASE" in the last 72 hours.  CBC: Recent Labs    05/07/22 0520  WBC 10.2  HGB 9.7*  HCT 30.7*  MCV 91.6  PLT 407*    Cardiac Enzymes: No results for input(s): "CKTOTAL", "CKMB", "CKMBINDEX", "TROPONINIHS" in the last 72 hours.  BNP: No results for  input(s): "BNP" in the last 72 hours.  D-Dimer: No results for input(s): "DDIMER" in the last 72 hours. Hemoglobin A1C: No results for input(s): "HGBA1C" in the last 72 hours. Fasting  Lipid Panel: No results for input(s): "CHOL", "HDL", "LDLCALC", "TRIG", "CHOLHDL", "LDLDIRECT" in the last 72 hours. Thyroid Function Tests: No results for input(s): "TSH", "T4TOTAL", "T3FREE", "THYROIDAB" in the last 72 hours.  Invalid input(s): "FREET3"  Anemia Panel: No results for input(s): "VITAMINB12", "FOLATE", "FERRITIN", "TIBC", "IRON", "RETICCTPCT" in the last 72 hours.   Radiology: ECHOCARDIOGRAM COMPLETE  Result Date: 05/06/2022    ECHOCARDIOGRAM REPORT   Patient Name:   TZIREL LEONOR Date of Exam: 05/06/2022 Medical Rec #:  [id number]      Height:       62.0 in Accession #:    [id number]     Weight:       149.9 lb Date of Birth:  [birth year]       BSA:          1.691 m Patient Age:    77 years       BP:           95/65 mmHg Patient Gender: F              HR:           107 bpm. Exam Location:  [facility] Procedure: 2D Echo Indications:     Atrial Fibrillation I48.91  History:         Patient has prior history of Echocardiogram examinations, most                  recent 04/20/2022.  Sonographer:     Overton Mam RDCS, FASE Referring Phys:  Lajuana Carry A KHAN Diagnosing Phys: Adrian Blackwater  Sonographer Comments: Technically difficult study due to poor echo windows. Image acquisition challenging due to respiratory motion. The patient had coughing spells throughout this study. IMPRESSIONS  1. Left ventricular ejection fraction, by estimation, is 60 to 65%. The left ventricle has normal function. The left ventricle has no regional wall motion abnormalities. There is mild concentric left ventricular hypertrophy. Left ventricular diastolic parameters are indeterminate.  2. Right ventricular systolic function is normal. The right ventricular size is moderately enlarged.  3. Left atrial size was moderately dilated.  4. Right atrial size was moderately dilated.  5. A small pericardial effusion is present. The pericardial effusion is circumferential. There is no evidence of cardiac tamponade.  6. The mitral  valve is normal in structure. Mild mitral valve regurgitation. No evidence of mitral stenosis.  7. The aortic valve is normal in structure. Aortic valve regurgitation is not visualized. No aortic stenosis is present.  8. The inferior vena cava is normal in size with greater than 50% respiratory variability, suggesting right atrial pressure of 3 mmHg. FINDINGS  Left Ventricle: Left ventricular ejection fraction, by estimation, is 60 to 65%. The left ventricle has normal function. The left ventricle has no regional wall motion abnormalities. The left ventricular internal cavity size was normal in size. There is  mild concentric left ventricular hypertrophy. Left ventricular diastolic parameters are indeterminate. Right Ventricle: The right ventricular size is moderately enlarged. No increase in right ventricular wall thickness. Right ventricular systolic function is normal. Left Atrium: Left atrial size was moderately dilated. Right Atrium: Right atrial size was moderately dilated. Pericardium: A small pericardial effusion is present. The pericardial effusion is circumferential. There is no evidence of cardiac tamponade. Mitral Valve: The mitral valve is  normal in structure. Mild mitral valve regurgitation. No evidence of mitral valve stenosis. Tricuspid Valve: The tricuspid valve is normal in structure. Tricuspid valve regurgitation is mild . No evidence of tricuspid stenosis. Aortic Valve: The aortic valve is normal in structure. Aortic valve regurgitation is not visualized. No aortic stenosis is present. Aortic valve peak gradient measures 10.9 mmHg. Pulmonic Valve: The pulmonic valve was normal in structure. Pulmonic valve regurgitation is not visualized. No evidence of pulmonic stenosis. Aorta: The aortic root is normal in size and structure. Venous: The inferior vena cava is normal in size with greater than 50% respiratory variability, suggesting right atrial pressure of 3 mmHg. IAS/Shunts: No atrial level shunt  detected by color flow Doppler.  LEFT VENTRICLE PLAX 2D LVIDd:         3.80 cm   Diastology LVIDs:         2.70 cm   LV e' medial:    13.40 cm/s LV PW:         1.00 cm   LV E/e' medial:  6.7 LV IVS:        1.00 cm   LV e' lateral:   8.27 cm/s LVOT diam:     1.80 cm   LV E/e' lateral: 10.8 LV SV:         58 LV SV Index:   34 LVOT Area:     2.54 cm  RIGHT VENTRICLE RV Basal diam:  2.80 cm RV S prime:     12.10 cm/s TAPSE (M-mode): 1.0 cm LEFT ATRIUM           Index        RIGHT ATRIUM           Index LA diam:      3.60 cm 2.13 cm/m   RA Area:     16.20 cm LA Vol (A4C): 30.2 ml 17.86 ml/m  RA Volume:   45.30 ml  26.78 ml/m  AORTIC VALVE                 PULMONIC VALVE AV Area (Vmax): 1.77 cm     PV Vmax:        1.04 m/s AV Vmax:        165.00 cm/s  PV Peak grad:   4.3 mmHg AV Peak Grad:   10.9 mmHg    RVOT Peak grad: 3 mmHg LVOT Vmax:      115.00 cm/s LVOT Vmean:     75.800 cm/s LVOT VTI:       0.226 m  AORTA Ao Root diam: 3.00 cm Ao Asc diam:  3.20 cm MITRAL VALVE               TRICUSPID VALVE MV Area (PHT): 2.94 cm    TR Peak grad:   22.3 mmHg MV Decel Time: 258 msec    TR Vmax:        236.00 cm/s MV E velocity: 89.60 cm/s                            SHUNTS                            Systemic VTI:  0.23 m                            Systemic Diam: 1.80 cm [employer] Electronically  signed by Adrian Blackwater Signature Date/Time: 05/06/2022/11:04:36 PM    Final    DG Chest Port 1 View  Result Date: 05/06/2022 CLINICAL DATA:  Shortness of breath EXAM: PORTABLE CHEST 1 VIEW COMPARISON:  05/05/2018 FINDINGS: Cardiac shadow is enlarged but stable. Watchman device is again seen. Postsurgical changes in the thoracolumbar spine are noted. Slight increased central vascular congestion is noted with mild interstitial edema. Small effusions are noted bilaterally. IMPRESSION: Slight increase in vascular congestion with mild interstitial edema. Small effusions are again noted. Electronically Signed   By: Alcide Clever M.D.    On: 05/06/2022 09:29   DG Chest Port 1 View  Result Date: 05/05/2022 CLINICAL DATA:  Shortness of breath. EXAM: PORTABLE CHEST 1 VIEW COMPARISON:  05/13/2022. FINDINGS: Increasing bibasilar opacities, left-greater-than-right, suspicious for aspiration or multifocal pneumonia. Unchanged small left pleural effusion. Stable cardiac and mediastinal contours. No pneumothorax. IMPRESSION: Increasing bibasilar opacities, greater on the left, suspicious for aspiration or multifocal pneumonia. Electronically Signed   By: Orvan Falconer M.D.   On: 05/05/2022 13:20   DG Chest Port 1 View  Result Date: 05/02/2022 CLINICAL DATA:  Chest pain EXAM: PORTABLE CHEST 1 VIEW COMPARISON:  04/20/2022 FINDINGS: Mild bilateral lower lobe opacities, left greater than right, atelectasis versus pneumonia. Small left pleural effusion, new. No frank interstitial edema. Cardiomegaly. Thoracolumbar spine fixation hardware. Cervical spine fixation hardware, incompletely visualized. IMPRESSION: Mild bilateral lower lobe opacities, left greater than right, atelectasis versus pneumonia. Small left pleural effusion. Electronically Signed   By: Charline Bills M.D.   On: 04/22/2022 03:37   DG Chest 2 View  Result Date: 04/22/2022 CLINICAL DATA:  77 year old female, preoperative evaluation. EXAM: CHEST - 2 VIEW COMPARISON:  10/13/2013, 01/30/2022 FINDINGS: The mediastinal contours are within normal limits. No cardiomegaly. The lungs are clear bilaterally without evidence of focal consolidation, pleural effusion, or pneumothorax. Multilevel, incompletely visualized thoracolumbar fusion hardware in place. Lower cervical ACDF hardware in place. No acute osseous abnormality. IMPRESSION: No acute cardiopulmonary process. Electronically Signed   By: Marliss Coots M.D.   On: 04/22/2022 11:34   EP STUDY  Result Date: 04/20/2022 CONCLUSIONS: 1.Successful implantation of a WATCHMAN left atrial appendage occlusive device   2. TEE demonstrating no  LAA thrombus 3. No early apparent complications. Post Implant Anticoagulation Strategy: Continue Eliquis 5mg  PO BID x 45 days after implant. After 45 days, stop Eliquis and start Plavix 75mg  PO daily to complete 6 months of post implant therapy. Plan for CT scan 60 days after implant to assess Watchman position.   ECHO TEE  Result Date: 04/20/2022    TRANSESOPHOGEAL ECHO REPORT   Patient Name:   MIREYA MEDITZ Date of Exam: 04/20/2022 Medical Rec #:  [id number]      Height:       62.0 in Accession #:    [id number]     Weight:       145.0 lb Date of Birth:  Jul 19, 1945       BSA:          1.667 m Patient Age:    77 years       BP:           158/56 mmHg Patient Gender: F              HR:           76 bpm. Exam Location:  Inpatient Procedure: Transesophageal Echo, 3D Echo, Color Doppler and Cardiac Doppler Indications:     I48.2 Chronic atrial fibrillation  History:         Patient has prior history of Echocardiogram examinations, most                  recent 09/20/2021. Arrythmias:Atrial Fibrillation; Risk                  Factors:Hypertension and Dyslipidemia.  Sonographer:     Irving Burton Senior RDCS Referring Phys:  [id number] Lanier Prude Diagnosing Phys: Riley Lam MD  Sonographer Comments: 27mm Watchman FLX Implanted PROCEDURE: After discussion of the risks and benefits of a TEE, an informed consent was obtained from the patient. The transesophogeal probe was passed without difficulty through the esophogus of the patient. Sedation performed by different physician. The patient was monitored while under deep sedation. The patient developed no complications during the procedure.  IMPRESSIONS  1. Prior to procedure, patent left atrial appendage. Maximal diameter 2.19 cm (ostial measurements). Sufficient sizing for a 27 mm Watchman FLX device.  2. A mid-posterior transeptal puncture was performed.  3. a 27 mm Watchman FLX was deployed. Greater than 1/2 shoulder seen. Small tissue compression seen at the anterior  mitral valve annulus. No thrombus seen. Device recaptured.  4. At redeployment a 27 mm Watchman FLX was present. No peri-device leak. No thrombus. A 1/3-1/2 mitral shoulder was noted in the 135 view. Average compression ~ 15%.  5. No significant pericardial effusion seen throughout study.  6. Iatrogenic ASD present. Majority of flow was left to right, rare right to left shunting.  7. Left ventricular ejection fraction, by estimation, is 55 to 60%. The left ventricle has normal function.  8. Right ventricular systolic function is normal. The right ventricular size is normal.  9. Left atrial size was moderately dilated. No left atrial/left atrial appendage thrombus was detected. 10. Right atrial size was mildly dilated. 11. The mitral valve is normal in structure. Mild mitral valve regurgitation. 12. The aortic valve is tricuspid. Aortic valve regurgitation is not visualized. No aortic stenosis is present. 13. Evidence of atrial level shunting detected by color flow Doppler. FINDINGS  Left Ventricle: Left ventricular ejection fraction, by estimation, is 55 to 60%. The left ventricle has normal function. The left ventricular internal cavity size was normal in size. Right Ventricle: The right ventricular size is normal. Right vetricular wall thickness was not well visualized. Right ventricular systolic function is normal. Left Atrium: Left atrial size was moderately dilated. No left atrial/left atrial appendage thrombus was detected. Right Atrium: Right atrial size was mildly dilated. Pericardium: There is no evidence of pericardial effusion. Mitral Valve: The mitral valve is normal in structure. Mild mitral valve regurgitation. Tricuspid Valve: The tricuspid valve is normal in structure. Tricuspid valve regurgitation is trivial. No evidence of tricuspid stenosis. Aortic Valve: The aortic valve is tricuspid. Aortic valve regurgitation is not visualized. No aortic stenosis is present. Pulmonic Valve: The pulmonic valve  was normal in structure. Pulmonic valve regurgitation is not visualized. No evidence of pulmonic stenosis. Aorta: The aortic root, ascending aorta, aortic arch and descending aorta are all structurally normal, with no evidence of dilitation or obstruction. IAS/Shunts: Evidence of atrial level shunting detected by color flow Doppler. Additional Comments: Spectral Doppler performed. Riley Lam MD Electronically signed by Riley Lam MD Signature Date/Time: 04/20/2022/10:50:03 AM    Final     ECHO - TEE A04/04/2022  1. Prior to procedure, patent left atrial appendage. Maximal diameter  2.19 cm (ostial measurements). Sufficient sizing for a 27 mm Watchman FLX  device.  2. A mid-posterior transeptal puncture was performed.   3. a 27 mm Watchman FLX was deployed. Greater than 1/2 shoulder seen.  Small tissue compression seen at the anterior mitral valve annulus. No  thrombus seen. Device recaptured.   4. At redeployment a 27 mm Watchman FLX was present. No peri-device leak.  No thrombus. A 1/3-1/2 mitral shoulder was noted in the 135 view. Average  compression ~ 15%.   5. No significant pericardial effusion seen throughout study.   6. Iatrogenic ASD present. Majority of flow was left to right, rare right  to left shunting.   7. Left ventricular ejection fraction, by estimation, is 55 to 60%. The  left ventricle has normal function.   8. Right ventricular systolic function is normal. The right ventricular  size is normal.   9. Left atrial size was moderately dilated. No left atrial/left atrial  appendage thrombus was detected.  10. Right atrial size was mildly dilated.  11. The mitral valve is normal in structure. Mild mitral valve  regurgitation.  12. The aortic valve is tricuspid. Aortic valve regurgitation is not  visualized. No aortic stenosis is present.  13. Evidence of atrial level shunting detected by color flow Doppler.    TELEMETRY reviewed by me (LT) 05/08/2022 :  overnight NSR rate 60s, converted back to AF RVR early AM with rates 130s-140s  EKG reviewed by me: NSR 74 nonspecific TW abnormality   Data reviewed by me (LT) 05/08/2022:  hospitalist progress note, nursing notes last 24h vitals tele labs imaging I/O    Principal Problem:   CAP (community acquired pneumonia) Active Problems:   HLD (hyperlipidemia)   Chronic atrial fibrillation with RVR   HTN (hypertension)   Chronic diastolic CHF (congestive heart failure)   Chronic kidney disease, stage 3a   Chronic pain   Asthma   Normocytic anemia   Sepsis    ASSESSMENT AND PLAN:  Tichina Koebel is a 63yoF with a PMH of paroxysmal AF s/p recent Watchman (LAAO closure) placement 4/4, HFpEF, right CEA (09/2021), s/p spinal fusion who presented to [facility] 05/14/2022 with cough, shortness of breath, and chest pain for 2 days. Initial concern for CAP, but symptoms ultimately felt to be more related to acute on chronic HFpEF and decompensation from atrial fibrillation with RVR.  Cardiology is consulted for further assistance.  # paroxysmal AF RVR # s/p Watchman LAAO closure device 04/20/2022 Presents with shortness of breath and chest pain x 2 days, in AF RVR with difficult to control HR. Very symptomatic with anxiety. Rate better controlled this AM in the 90s-110s.  - s/p diltiazem infusion with borderline low BP - continue metoprolol tartrate 12.5mg  BID - s/p IV amiodarone bolus and multiple days of IV infusion. Start PO amio 200mg  BID for 10 days, then 200mg  daily thereafter. - continue eliquis 5mg  BID x 45 days following Watchman as per EP  - baseline LFTs: AST/ALT 20/32 respectively  - TSH 1.5, free T4 1.12 - careful dosing of anxiolytics as patient became very sedated on valium  - hesitant to consider DCCV before repeat imaging to r/o thrombus with recent Watchman placement    # acute hypoxic respiratory failure  # ?CAP # acute on chronic HFpEF  BNP elevated at 700. Clinically somewhat volume  up with trace peripheral edema and an oxygen requirement. Initial Cxr with small pleural effusion. Remains on Abx for CAP.  - give another 40mg  IV lasix x 1 today - careful with medications the prolong the  QT interval while on amio   # AKI on CKD 3 Renal function worsening with lasix yesterday PM. Continue to monitor closely.   This patient's plan of care was discussed and created with Dr. Juliann Pares and he is in agreement.  Signed: Rebeca Allegra , PA-C 05/08/2022, 10:54 AM [facility]

## 2022-05-08 NOTE — Progress Notes (Signed)
PHARMACIST - PHYSICIAN COMMUNICATION  CONCERNING: Antibiotic IV to Oral Route Change Policy  RECOMMENDATION: This patient is receiving azithromycin by the intravenous route.  Based on criteria approved by the Pharmacy and Therapeutics Committee, the antibiotic(s) is/are being converted to the equivalent oral dose form(s).  DESCRIPTION: These criteria include: Patient being treated for a respiratory tract infection, urinary tract infection, cellulitis or clostridium difficile associated diarrhea if on metronidazole The patient is not neutropenic and does not exhibit a GI malabsorption state The patient is eating (either orally or via tube) and/or has been taking other orally administered medications for a least 24 hours The patient is improving clinically and has a Tmax < 100.5  If you have questions about this conversion, please contact the Pharmacy Department   Molly Mitchell B Job [place] 05/08/22   

## 2022-05-08 NOTE — Progress Notes (Signed)
PROGRESS NOTE    Molly Mitchell   ZOX:[id number] DOB: Mar 08, 1945  DOA: 04/18/2022 Date of Service: 05/08/22 PCP: [facility]     Brief Narrative / Hospital Course:  Molly Mitchell is a 77 y.o. female with medical history significant of A-fib on Eliquis, s/p of Watchman left atrial appendage closure device placement on 04/20/23, hypertension, hyperlipidemia, asthma, diastolic CHF, stroke, CKD-3A, chronic pain, carotid artery stenosis, who presents to the ED from home 04/19/2022 with cough, shortness of breath, chest pain x2 days. Patient recently had Watchman left atrial appendage closure device placement on 04/20/23.  04/17: atrial fibrillation with RVR, heart rate up to 137.  Patient was given 10 mg of Cardizem with temporary improved heart rate, but heart rates increased again, and started on cardizem drip in ED. Negative PCR for COVID, flu and RSV, WBC 13.7, lactic acid 1.3, procalcitonin 0.19. Urinalysis (cloudy appearance, small amount of leukocyte, negative nitrite, WBC 0-5 with squamous cell contamination 21-50), slightly worsening renal function, BNP 716, trop 6 --> 5. Temperature normal, blood pressure 102/74, RR 25-37, oxygen saturation 97% on room air. Pt is admitted PCU as inpt. Tx for Afib RVR and sepsis d/t CAP. Hr initially improved to 80s. 04/18: significantly anxious, became over sedated w/ valium, low BP, improved in afternoon. Echo reviewed from Watchman device placement - TEE 04/20/22 LVEF 55-60% normal LV and RV fxn, LA mod dilated. Cardiology to adjust rate/rhythm control --> d/c dilt gtt, start po diltiazem, change coreg to metoprolol 12.5 mg po q6h, started amiodarone bolus/gtt. Remained very anxious - alprazolam lower dose administered.  04/19:  converted to sinus rhythm about mid-day. Concern for worsening infiltrate on CXR, add azithromycin, consider repeat CXR in AM. 04/20: amio gtt d/c overnight d/t hypotension/bradycardia. BP and HR normalized. CXR personally  reviewed - infiltrate worse on L appears more c/w edema/vascular congestion, trial another dose lasix 40 mg IV diuresis. RVR recurred, restarted amiodarone gtt lower dise since had po amio also, BP a bit low so holding metoprolol po for now. Cardiology following. Echo ordered.  04/21: Echo reviewed - EF 60-65%, indeterminate diastolic parameters, enlarged RV, moderately dilated LA and RA, small pericardial effusion w/o tamponade. This morning still 4L/min [place], BP soft. Maintain amio gtt, added midodrine rather than add fluids. Cardiology following, appreciate any further recs.  04/22: Overnight HR to 146, given IV metoprolol and converted to sinus but hypotensive, got IV fluids, amio gtt held. Converted to sinus. Somnolent but rousable, reduced/adjusted sedating medications. PT/OT ordered today now that HR and BP improving.  Consultants:  Cardiology   Procedures: None       ASSESSMENT & PLAN:   Principal Problem:   CAP (community acquired pneumonia) Active Problems:   Sepsis   Chronic diastolic CHF (congestive heart failure)   Chronic atrial fibrillation with RVR   Asthma   HTN (hypertension)   Chronic pain   HLD (hyperlipidemia)   Chronic kidney disease, stage 3a   Normocytic anemia  Sepsis due to CAP (community acquired pneumonia)  vs SIRS d/t Afib RVR and anxiety  Elevated HR and RR can be either/both etiology Met criteria with WBC 13.6, heart rate up to 137 and RR 25-37.  Lactic acid normal 1.3.  Procalcitonin 0.19.   Slightly worsening renal function on admission with creatinine 1.23, BUN 42, GFR 46 (recent baseline creatinine 1.14 on 03/22/2022).    Urine legionella and S. pneumococcal antigen - neg Treat as below   Community Acquired Pneumonia  IV Rocephin and  doxycycline to cover PNA, changed doxy to azithromycin Mucinex for cough  Bronchodilators CXR prn  Chronic atrial fibrillation with acute RVR Amiodarone gtt per cardiology d/c as of 01:00 05/08/22  PO amio 200mg   BID for 14 days, then 200mg  daily thereafter.  holding metoprolol for now  Eliquis 5 mg po bid --> continue eliquis 5mg  BID x 45 days following Watchman as per EP  Cardiology following, appreciate assistance    Acute hypoxic respiratory failure  Supplemental O2 as needed   Chronic diastolic CHF (congestive heart failure), w/ exacerbation  2D echo on 04/20/2022 showed EF of 55-60%.  S/p lasix, which seems to have helped respiratory status  Strict I&O ordered but no significant output has been documented   Severe anxiety Sensitivity to benzodiazepine (oversedation w/ Valium)  Scheduled BuSpar  Asthma Bronchodilators   HTN (hypertension): IV hydralazine as needed Meds as above    Chronic pain Continue as needed Norco, reduced dose today  Scheduled reduced dose tizanidine  As needed Tylenol   HLD (hyperlipidemia) Pravastatin   AKI on Chronic kidney disease, stage 3a:  Monitor BMP   Normocytic anemia:  Hemoglobin 10.5 (12.1 on 03/22/2022), no active bleeding. Follow-up with CBC   DVT prophylaxis: Eliquis Pertinent IV fluids/nutrition: no continuous IV fluids at this time  Central lines / invasive devices: none  Code Status: FULL CODE ACP documents reviewed: none on file   Current Admission Status: inpatient, progressive unit d/t RVR difficult to control TOC needs / Dispo plan: anticipate d/c to previous home environment, TBD pending PT/OT Barriers to discharge / significant pending items: pending clinical improvement expect will be here 1-2 more days              Subjective / Brief ROS:  Patient is alert, reports anxiety a bit better today, reports SOB is better, no chest pain Pain controlled.  Denies new weakness.    Family Communication: husband at bedside on rounds     Objective Findings:  Vitals:   05/08/22 0105 05/08/22 0405 05/08/22 0500 05/08/22 0826  BP: 104/65 133/63  136/66  Pulse: 60 79  (!) 115  Resp: 15 15  20   Temp:  97.8 F (36.6  C)    TempSrc:  Oral  Oral  SpO2: 100% 95%  98%  Weight:   70.5 kg   Height:        Intake/Output Summary (Last 24 hours) at 05/08/2022 1154 Last data filed at 05/08/2022 1049 Gross per 24 hour  Intake 293.33 ml  Output 300 ml  Net -6.67 ml     Filed Weights   05/06/22 1000 05/07/22 0324 05/08/22 0500  Weight: 68 kg 70 kg 70.5 kg    Examination:  Physical Exam Constitutional:      Appearance: She is not toxic-appearing.  Cardiovascular:     Rate and Rhythm: Normal rate. Rhythm irregular.     Heart sounds: Murmur heard.     Systolic murmur is present.  Pulmonary:     Effort: No tachypnea, accessory muscle usage or respiratory distress.     Breath sounds: Examination of the right-lower field reveals rales. Examination of the left-lower field reveals rales. Rales present.  Musculoskeletal:     Right lower leg: No edema.     Left lower leg: No edema.  Skin:    General: Skin is warm and dry.  Neurological:     General: No focal deficit present.     Mental Status: She is alert and oriented to person, place,  and time.  Psychiatric:        Mood and Affect: Mood is anxious.          Scheduled Medications:   amiodarone  200 mg Oral BID   Followed by   Melene Muller ON 05/18/2022] amiodarone  200 mg Oral Daily   apixaban  5 mg Oral BID   busPIRone  5 mg Oral TID   furosemide  40 mg Intravenous Once   metoprolol tartrate  12.5 mg Oral BID   midodrine  5 mg Oral TID WC   potassium chloride  40 mEq Oral Once   pravastatin  40 mg Oral QHS   tiZANidine  2 mg Oral TID    Continuous Infusions:  azithromycin Stopped (05/07/22 1713)   cefTRIAXone (ROCEPHIN)  IV Stopped (05/07/22 1232)    PRN Medications:  acetaminophen, albuterol, dextromethorphan-guaiFENesin, HYDROcodone-acetaminophen, ondansetron (ZOFRAN) IV  Antimicrobials from admission:  Anti-infectives (From admission, onward)    Start     Dose/Rate Route Frequency Ordered Stop   05/05/22 1600  azithromycin  (ZITHROMAX) 500 mg in sodium chloride 0.9 % 250 mL IVPB        500 mg 250 mL/hr over 60 Minutes Intravenous Every 24 hours 05/05/22 1515 05/10/22 1559   05/02/2022 1900  doxycycline (VIBRA-TABS) tablet 100 mg  Status:  Discontinued        100 mg Oral Every 12 hours 05/11/2022 1129 05/07/22 1219   05/10/2022 1200  cefTRIAXone (ROCEPHIN) 2 g in sodium chloride 0.9 % 100 mL IVPB        2 g 200 mL/hr over 30 Minutes Intravenous Every 24 hours 05/11/2022 1129 05/10/22 1159   04/26/2022 0600  doxycycline (VIBRAMYCIN) 100 mg in sodium chloride 0.9 % 250 mL IVPB        100 mg 125 mL/hr over 120 Minutes Intravenous  Once 04/24/2022 0459 04/19/2022 0810           Data Reviewed:  I have personally reviewed the following...  CBC: Recent Labs  Lab 04/25/2022 0329 05/04/22 0418 05/05/22 0502 05/07/22 0520  WBC 13.6* 13.6* 19.4* 10.2  NEUTROABS 9.9*  --   --   --   HGB 10.5* 10.1* 10.2* 9.7*  HCT 32.7* 31.4* 31.7* 30.7*  MCV 90.8 91.5 90.6 91.6  PLT 455* 419* 490* 407*    Basic Metabolic Panel: Recent Labs  Lab 05/04/22 0418 05/05/22 0502 05/06/22 0435 05/07/22 0520 05/08/22 0441  NA 137 137 136 136 136  K 4.6 4.5 4.1 3.5 3.8  CL 104 101 102 100 101  CO2 23 22 22 24 25   GLUCOSE 145* 137* 110* 105* 117*  BUN 35* 45* 45* 40* 36*  CREATININE 1.19* 1.57* 1.53* 1.21* 1.32*  CALCIUM 8.7* 8.7* 8.0* 8.3* 8.4*  MG 2.5*  --   --   --   --     GFR: Estimated Creatinine Clearance: 33.4 mL/min (A) (by C-G formula based on SCr of 1.32 mg/dL (H)). Liver Function Tests: Recent Labs  Lab 05/08/2022 0329 05/05/22 0502  AST 58* 20  ALT 55* 32  ALKPHOS 312* 248*  BILITOT 0.6 0.4  PROT 7.6 7.4  ALBUMIN 3.4* 3.3*    Recent Labs  Lab 05/16/2022 0329  LIPASE 26    No results for input(s): "AMMONIA" in the last 168 hours. Coagulation Profile: No results for input(s): "INR", "PROTIME" in the last 168 hours. Cardiac Enzymes: No results for input(s): "CKTOTAL", "CKMB", "CKMBINDEX", "TROPONINI" in  the last 168 hours. BNP (last 3 results) No  results for input(s): "PROBNP" in the last 8760 hours. HbA1C: No results for input(s): "HGBA1C" in the last 72 hours. CBG: No results for input(s): "GLUCAP" in the last 168 hours. Lipid Profile: No results for input(s): "CHOL", "HDL", "LDLCALC", "TRIG", "CHOLHDL", "LDLDIRECT" in the last 72 hours. Thyroid Function Tests: No results for input(s): "TSH", "T4TOTAL", "FREET4", "T3FREE", "THYROIDAB" in the last 72 hours.  Anemia Panel: No results for input(s): "VITAMINB12", "FOLATE", "FERRITIN", "TIBC", "IRON", "RETICCTPCT" in the last 72 hours. Most Recent Urinalysis On File:     Component Value Date/Time   COLORURINE YELLOW (A) 05/05/2022 0811   APPEARANCEUR CLOUDY (A) 05/08/2022 0811   APPEARANCEUR Clear 02/19/2013 0916   LABSPEC 1.018 05/05/2022 0811   LABSPEC 1.010 02/19/2013 0916   PHURINE 5.0 05/16/2022 0811   GLUCOSEU NEGATIVE 05/16/2022 0811   GLUCOSEU Negative 02/19/2013 0916   HGBUR NEGATIVE 04/19/2022 0811   BILIRUBINUR NEGATIVE 05/07/2022 0811   BILIRUBINUR negative 10/23/2017 1324   BILIRUBINUR Negative 02/19/2013 0916   KETONESUR NEGATIVE 04/24/2022 0811   PROTEINUR 30 (A) 04/26/2022 0811   UROBILINOGEN 0.2 10/23/2017 1324   NITRITE NEGATIVE 05/14/2022 0811   LEUKOCYTESUR SMALL (A) 05/07/2022 0811   LEUKOCYTESUR Negative 02/19/2013 0916   Sepsis Labs: @LABRCNTIP (procalcitonin:4,lacticidven:4) Microbiology: Recent Results (from the past 240 hour(s))  Culture, blood (routine x 2)     Status: None (Preliminary result)   Collection Time: 05/02/2022  5:37 AM   Specimen: BLOOD RIGHT ARM  Result Value Ref Range Status   Specimen Description BLOOD RIGHT ARM  Final   Special Requests   Final    BOTTLES DRAWN AEROBIC AND ANAEROBIC Blood Culture adequate volume   Culture   Final    NO GROWTH 4 DAYS Performed at [facility], [address] [place], [place] [address]    Report Status PENDING  Incomplete  Resp panel  by RT-PCR (RSV, Flu A&B, Covid) Anterior Nasal Swab     Status: None   Collection Time: 05/08/2022  5:37 AM   Specimen: Anterior Nasal Swab  Result Value Ref Range Status   SARS Coronavirus 2 by RT PCR NEGATIVE NEGATIVE Final    Comment: (NOTE) SARS-CoV-2 target nucleic acids are NOT DETECTED.  The SARS-CoV-2 RNA is generally detectable in upper respiratory specimens during the acute phase of infection. The lowest concentration of SARS-CoV-2 viral copies this assay can detect is 138 copies/mL. A negative result does not preclude SARS-Cov-2 infection and should not be used as the sole basis for treatment or other patient management decisions. A negative result may occur with  improper specimen collection/handling, submission of specimen other than nasopharyngeal swab, presence of viral mutation(s) within the areas targeted by this assay, and inadequate number of viral copies(<138 copies/mL). A negative result must be combined with clinical observations, patient history, and epidemiological information. The expected result is Negative.  Fact Sheet for Patients:  BloggerCourse.com  Fact Sheet for Healthcare Providers:  SeriousBroker.it  This test is no t yet approved or cleared by the [place] FDA and  has been authorized for detection and/or diagnosis of SARS-CoV-2 by FDA under an Emergency Use Authorization (EUA). This EUA will remain  in effect (meaning this test can be used) for the duration of the COVID-19 declaration under Section 564(b)(1) of the Act, 21 U.S.C.section 360bbb-3(b)(1), unless the authorization is terminated  or revoked sooner.       Influenza A by PCR NEGATIVE NEGATIVE Final   Influenza B by PCR NEGATIVE NEGATIVE Final    Comment: (NOTE)  The Xpert Xpress SARS-CoV-2/FLU/RSV plus assay is intended as an aid in the diagnosis of influenza from Nasopharyngeal swab specimens and should not be used as a sole  basis for treatment. Nasal washings and aspirates are unacceptable for Xpert Xpress SARS-CoV-2/FLU/RSV testing.  Fact Sheet for Patients: BloggerCourse.com  Fact Sheet for Healthcare Providers: SeriousBroker.it  This test is not yet approved or cleared by the [place] FDA and has been authorized for detection and/or diagnosis of SARS-CoV-2 by FDA under an Emergency Use Authorization (EUA). This EUA will remain in effect (meaning this test can be used) for the duration of the COVID-19 declaration under Section 564(b)(1) of the Act, 21 U.S.C. section 360bbb-3(b)(1), unless the authorization is terminated or revoked.     Resp Syncytial Virus by PCR NEGATIVE NEGATIVE Final    Comment: (NOTE) Fact Sheet for Patients: BloggerCourse.com  Fact Sheet for Healthcare Providers: SeriousBroker.it  This test is not yet approved or cleared by the [place] FDA and has been authorized for detection and/or diagnosis of SARS-CoV-2 by FDA under an Emergency Use Authorization (EUA). This EUA will remain in effect (meaning this test can be used) for the duration of the COVID-19 declaration under Section 564(b)(1) of the Act, 21 U.S.C. section 360bbb-3(b)(1), unless the authorization is terminated or revoked.  Performed at [facility], [address] [place], [place] [address]   Culture, blood (routine x 2)     Status: None (Preliminary result)   Collection Time: 05/04/22  2:54 PM   Specimen: BLOOD  Result Value Ref Range Status   Specimen Description BLOOD BLOOD LEFT HAND  Final   Special Requests   Final    BOTTLES DRAWN AEROBIC AND ANAEROBIC Blood Culture adequate volume   Culture   Final    NO GROWTH 3 DAYS Performed at [facility], [address], [place], [place] [address]    Report Status PENDING  Incomplete      Radiology Studies last 3  days: ECHOCARDIOGRAM COMPLETE  Result Date: 05/06/2022    ECHOCARDIOGRAM REPORT   Patient Name:   ALLANTE BEANE Date of Exam: 05/06/2022 Medical Rec #:  [id number]      Height:       62.0 in Accession #:    [id number]     Weight:       149.9 lb Date of Birth:  May 24, 1945       BSA:          1.691 m Patient Age:    77 years       BP:           95/65 mmHg Patient Gender: F              HR:           107 bpm. Exam Location:  [facility] Procedure: 2D Echo Indications:     Atrial Fibrillation I48.91  History:         Patient has prior history of Echocardiogram examinations, most                  recent 04/20/2022.  Sonographer:     Overton Mam RDCS, FASE Referring Phys:  Lajuana Carry A KHAN Diagnosing Phys: Adrian Blackwater  Sonographer Comments: Technically difficult study due to poor echo windows. Image acquisition challenging due to respiratory motion. The patient had coughing spells throughout this study. IMPRESSIONS  1. Left ventricular ejection fraction, by estimation, is 60 to 65%. The left ventricle has normal function. The left  ventricle has no regional wall motion abnormalities. There is mild concentric left ventricular hypertrophy. Left ventricular diastolic parameters are indeterminate.  2. Right ventricular systolic function is normal. The right ventricular size is moderately enlarged.  3. Left atrial size was moderately dilated.  4. Right atrial size was moderately dilated.  5. A small pericardial effusion is present. The pericardial effusion is circumferential. There is no evidence of cardiac tamponade.  6. The mitral valve is normal in structure. Mild mitral valve regurgitation. No evidence of mitral stenosis.  7. The aortic valve is normal in structure. Aortic valve regurgitation is not visualized. No aortic stenosis is present.  8. The inferior vena cava is normal in size with greater than 50% respiratory variability, suggesting right atrial pressure of 3 mmHg. FINDINGS  Left Ventricle: Left ventricular  ejection fraction, by estimation, is 60 to 65%. The left ventricle has normal function. The left ventricle has no regional wall motion abnormalities. The left ventricular internal cavity size was normal in size. There is  mild concentric left ventricular hypertrophy. Left ventricular diastolic parameters are indeterminate. Right Ventricle: The right ventricular size is moderately enlarged. No increase in right ventricular wall thickness. Right ventricular systolic function is normal. Left Atrium: Left atrial size was moderately dilated. Right Atrium: Right atrial size was moderately dilated. Pericardium: A small pericardial effusion is present. The pericardial effusion is circumferential. There is no evidence of cardiac tamponade. Mitral Valve: The mitral valve is normal in structure. Mild mitral valve regurgitation. No evidence of mitral valve stenosis. Tricuspid Valve: The tricuspid valve is normal in structure. Tricuspid valve regurgitation is mild . No evidence of tricuspid stenosis. Aortic Valve: The aortic valve is normal in structure. Aortic valve regurgitation is not visualized. No aortic stenosis is present. Aortic valve peak gradient measures 10.9 mmHg. Pulmonic Valve: The pulmonic valve was normal in structure. Pulmonic valve regurgitation is not visualized. No evidence of pulmonic stenosis. Aorta: The aortic root is normal in size and structure. Venous: The inferior vena cava is normal in size with greater than 50% respiratory variability, suggesting right atrial pressure of 3 mmHg. IAS/Shunts: No atrial level shunt detected by color flow Doppler.  LEFT VENTRICLE PLAX 2D LVIDd:         3.80 cm   Diastology LVIDs:         2.70 cm   LV e' medial:    13.40 cm/s LV PW:         1.00 cm   LV E/e' medial:  6.7 LV IVS:        1.00 cm   LV e' lateral:   8.27 cm/s LVOT diam:     1.80 cm   LV E/e' lateral: 10.8 LV SV:         58 LV SV Index:   34 LVOT Area:     2.54 cm  RIGHT VENTRICLE RV Basal diam:  2.80 cm RV S  prime:     12.10 cm/s TAPSE (M-mode): 1.0 cm LEFT ATRIUM           Index        RIGHT ATRIUM           Index LA diam:      3.60 cm 2.13 cm/m   RA Area:     16.20 cm LA Vol (A4C): 30.2 ml 17.86 ml/m  RA Volume:   45.30 ml  26.78 ml/m  AORTIC VALVE  PULMONIC VALVE AV Area (Vmax): 1.77 cm     PV Vmax:        1.04 m/s AV Vmax:        165.00 cm/s  PV Peak grad:   4.3 mmHg AV Peak Grad:   10.9 mmHg    RVOT Peak grad: 3 mmHg LVOT Vmax:      115.00 cm/s LVOT Vmean:     75.800 cm/s LVOT VTI:       0.226 m  AORTA Ao Root diam: 3.00 cm Ao Asc diam:  3.20 cm MITRAL VALVE               TRICUSPID VALVE MV Area (PHT): 2.94 cm    TR Peak grad:   22.3 mmHg MV Decel Time: 258 msec    TR Vmax:        236.00 cm/s MV E velocity: 89.60 cm/s                            SHUNTS                            Systemic VTI:  0.23 m                            Systemic Diam: 1.80 cm Adrian Blackwater Electronically signed by Adrian Blackwater Signature Date/Time: 05/06/2022/11:04:36 PM    Final    DG Chest Port 1 View  Result Date: 05/06/2022 CLINICAL DATA:  Shortness of breath EXAM: PORTABLE CHEST 1 VIEW COMPARISON:  05/05/2018 FINDINGS: Cardiac shadow is enlarged but stable. Watchman device is again seen. Postsurgical changes in the thoracolumbar spine are noted. Slight increased central vascular congestion is noted with mild interstitial edema. Small effusions are noted bilaterally. IMPRESSION: Slight increase in vascular congestion with mild interstitial edema. Small effusions are again noted. Electronically Signed   By: Alcide Clever M.D.   On: 05/06/2022 09:29   DG Chest Port 1 View  Result Date: 05/05/2022 CLINICAL DATA:  Shortness of breath. EXAM: PORTABLE CHEST 1 VIEW COMPARISON:  04/20/2022. FINDINGS: Increasing bibasilar opacities, left-greater-than-right, suspicious for aspiration or multifocal pneumonia. Unchanged small left pleural effusion. Stable cardiac and mediastinal contours. No pneumothorax. IMPRESSION:  Increasing bibasilar opacities, greater on the left, suspicious for aspiration or multifocal pneumonia. Electronically Signed   By: Orvan Falconer M.D.   On: 05/05/2022 13:20             LOS: 5 days    Time spent: 50 min    Sunnie Nielsen, DO [facility] 05/08/2022, 11:54 AM    Dictation software may have been used to generate the above note. Typos may occur and escape review in typed/dictated notes. Please contact Dr Lyn Hollingshead directly for clarity if needed.  Staff may message me via secure chat in Epic  but this may not receive an immediate response,  please page me for urgent matters!  If 7PM-7AM, please contact night coverage [email or web address]

## 2022-05-08 NOTE — Evaluation (Signed)
Physical Therapy Evaluation Patient Details Name: Molly Mitchell MRN: [id number] DOB: 1945/08/18 Today's Date: 05/08/2022  History of Present Illness  Pt is a 77 y/o F admitted on 05/11/2022 after presenting with c/o SOB, cough, & chest pain x 2 days. Pt is being treated for sepsis 2/2 CAP vs SIRS 2/2 a-fib with RVR & anxiety. PMH: a-fib on eliquis, s/p watchman L atrial appendage closure device placement (04/20/22), HTN, HLD, asthma, diastolic CHF, stroke, CKD3A, chronic pain, carotid artery stenosis, spinal fusion (Jan 2024)  Clinical Impression  Pt seen for PT evaluation with pt agreeable to tx. Pt reports prior to admission she was ambulating with 2 canes, as she did not like RW. Pt notes she also "wasn't doing well" 2/2 back pain following back sx. On this date, pt is able to complete bed mobility with supervision with hospital bed features, transfers with RW & supervision, and short distance gait in room with RW & supervision. Pt engages in exercises for strengthening & endurance training. Educated pt on importance of OOB mobility & sitting in recliner. Continue to recommend PT services to address balance, strengthening, & progress gait with LRAD.     Recommendations for follow up therapy are one component of a multi-disciplinary discharge planning process, led by the attending physician.  Recommendations may be updated based on patient status, additional functional criteria and insurance authorization.  Follow Up Recommendations       Assistance Recommended at Discharge Intermittent Supervision/Assistance  Patient can return home with the following  A little help with bathing/dressing/bathroom;A little help with walking and/or transfers;Assistance with cooking/housework;Assist for transportation;Help with stairs or ramp for entrance    Equipment Recommendations Rolling walker (2 wheels)  Recommendations for Other Services       Functional Status Assessment Patient has had a recent decline  in their functional status and demonstrates the ability to make significant improvements in function in a reasonable and predictable amount of time.     Precautions / Restrictions Precautions Precautions: Fall Restrictions Weight Bearing Restrictions: No      Mobility  Bed Mobility Overal bed mobility: Needs Assistance Bed Mobility: Supine to Sit     Supine to sit: Supervision, HOB elevated          Transfers Overall transfer level: Needs assistance Equipment used: Rolling walker (2 wheels) Transfers: Sit to/from Stand, Bed to chair/wheelchair/BSC Sit to Stand: Min guard, Supervision   Step pivot transfers: Min guard       General transfer comment: educational cuing re: hand placement during STS    Ambulation/Gait Ambulation/Gait assistance: Supervision Gait Distance (Feet): 25 Feet (+ 25 ft) Assistive device: Rolling walker (2 wheels) Gait Pattern/deviations: Decreased step length - right, Decreased step length - left, Decreased stride length Gait velocity: decreased        Stairs            Wheelchair Mobility    Modified Rankin (Stroke Patients Only)       Balance Overall balance assessment: Needs assistance Sitting-balance support: Feet supported Sitting balance-Leahy Scale: Good     Standing balance support: During functional activity, Bilateral upper extremity supported, Reliant on assistive device for balance Standing balance-Leahy Scale: Fair                               Pertinent Vitals/Pain Pain Assessment Pain Assessment: No/denies pain    Home Living Family/patient expects to be discharged to:: Private residence Living Arrangements: Spouse/significant  other Available Help at Discharge: Family;Available 24 hours/day Type of Home: House Home Access: Stairs to enter Entrance Stairs-Rails: Right;Left;Can reach both Entrance Stairs-Number of Steps: 4   Home Layout: One level Home Equipment: Cane - single  point;Shower seat;Grab bars - tub/shower;Adaptive equipment;Toilet riser;Rollator (4 wheels)      Prior Function               Mobility Comments: Pt reports she "wasn't doing well" 2/2 back pain since back sx at the beginning of this year. Pt reports she was ambulating with 2 canes 2/2 not liking a walker.       Hand Dominance        Extremity/Trunk Assessment   Upper Extremity Assessment Upper Extremity Assessment: Generalized weakness    Lower Extremity Assessment Lower Extremity Assessment: Generalized weakness       Communication   Communication: No difficulties  Cognition Arousal/Alertness: Awake/alert Behavior During Therapy: WFL for tasks assessed/performed, Flat affect Overall Cognitive Status: Within Functional Limits for tasks assessed                                          General Comments General comments (skin integrity, edema, etc.): Pt on 3L/min via nasal cannula with SPO2 >90% but pt endorses SOB, PT educates pt on pursed lip breathing. BP at beginning of session 118/52 mmHg MAP 72    Exercises General Exercises - Lower Extremity Hip Flexion/Marching: AROM, Standing, Strengthening, Both, 10 reps (BUE on RW) Other Exercises Other Exercises: 5x STS without BUE support with supervision with task focusing on BLE strengthening & endurance training.   Assessment/Plan    PT Assessment Patient needs continued PT services  PT Problem List Decreased strength;Cardiopulmonary status limiting activity;Decreased activity tolerance;Decreased balance;Decreased knowledge of use of DME;Decreased mobility       PT Treatment Interventions Therapeutic exercise;Gait training;Balance training;DME instruction;Neuromuscular re-education;Stair training;Functional mobility training;Therapeutic activities;Patient/family education    PT Goals (Current goals can be found in the Care Plan section)  Acute Rehab PT Goals Patient Stated Goal: get  better/stronger PT Goal Formulation: With patient Time For Goal Achievement: 05/22/22 Potential to Achieve Goals: Good    Frequency Min 3X/week     Co-evaluation               AM-PAC PT "6 Clicks" Mobility  Outcome Measure Help needed turning from your back to your side while in a flat bed without using bedrails?: A Little Help needed moving from lying on your back to sitting on the side of a flat bed without using bedrails?: A Little Help needed moving to and from a bed to a chair (including a wheelchair)?: A Little Help needed standing up from a chair using your arms (e.g., wheelchair or bedside chair)?: A Little Help needed to walk in hospital room?: A Little Help needed climbing 3-5 steps with a railing? : A Little 6 Click Score: 18    End of Session Equipment Utilized During Treatment: Oxygen Activity Tolerance: Patient tolerated treatment well Patient left: with chair alarm set;in chair;with call bell/phone within reach Nurse Communication: Mobility status PT Visit Diagnosis: Muscle weakness (generalized) (M62.81);Difficulty in walking, not elsewhere classified (R26.2)    Time: 1610-9604 PT Time Calculation (min) (ACUTE ONLY): 23 min   Charges:   PT Evaluation $PT Eval Moderate Complexity: 1 Mod PT Treatments $Therapeutic Activity: 8-22 mins        [place]  Hyacinth Meeker, PT, DPT 05/08/22, 1:48 PM   Sandi Mariscal 05/08/2022, 1:47 PM

## 2022-05-08 NOTE — Evaluation (Signed)
Occupational Therapy Evaluation Patient Details Name: Molly Mitchell MRN: [id number] DOB: 02-Sep-1945 Today's Date: 05/08/2022   History of Present Illness Pt is a 77 y/o F admitted on 05/14/2022 after presenting with c/o SOB, cough, & chest pain x 2 days. Pt is being treated for sepsis 2/2 CAP vs SIRS 2/2 a-fib with RVR & anxiety. PMH: a-fib on eliquis, s/p watchman L atrial appendage closure device placement (04/20/22), HTN, HLD, asthma, diastolic CHF, stroke, CKD3A, chronic pain, carotid artery stenosis, spinal fusion (Jan 2024)   Clinical Impression   Patient presenting with decreased Ind in self care,balance, functional mobility/transfers, endurance, and safety awareness.  Patient reports being mod I at baseline with use of 2 canes for mobility and utilizes multiple pieces of equipment for LB ADLs secondary to spinal fusion in Jan 2024. Pt endorses husband is home to assist as needed and she really wants to get back to her gardening and cooking. Patient currently functioning a supervision - min guard for functional mobility tasks this session. She declined toileting needs but did stand and take several steps in room with min guard.  Patient will benefit from acute OT to increase overall independence in the areas of ADLs, functional mobility, and safety awareness in order to safely discharge.     Recommendations for follow up therapy are one component of a multi-disciplinary discharge planning process, led by the attending physician.  Recommendations may be updated based on patient status, additional functional criteria and insurance authorization.   Assistance Recommended at Discharge Intermittent Supervision/Assistance  Patient can return home with the following A little help with walking and/or transfers;A little help with bathing/dressing/bathroom;Assistance with cooking/housework;Assist for transportation;Help with stairs or ramp for entrance    Functional Status Assessment  Patient has had a  recent decline in their functional status and demonstrates the ability to make significant improvements in function in a reasonable and predictable amount of time.  Equipment Recommendations  BSC/3in1       Precautions / Restrictions Precautions Precautions: Fall Restrictions Weight Bearing Restrictions: No      Mobility Bed Mobility Overal bed mobility: Needs Assistance Bed Mobility: Supine to Sit, Sit to Supine     Supine to sit: Supervision Sit to supine: Supervision        Transfers Overall transfer level: Needs assistance Equipment used: 1 person hand held assist Transfers: Sit to/from Stand Sit to Stand: Min guard                  Balance Overall balance assessment: Needs assistance Sitting-balance support: Feet supported Sitting balance-Leahy Scale: Good                                     ADL either performed or assessed with clinical judgement   ADL Overall ADL's : Needs assistance/impaired     Grooming: Wash/dry hands;Wash/dry face;Sitting;Set up;Supervision/safety                   Toilet Transfer: Solicitor;Ambulation Toilet Transfer Details (indicate cue type and reason): simulated                 Vision Patient Visual Report: No change from baseline              Pertinent Vitals/Pain Pain Assessment Pain Assessment: No/denies pain     Hand Dominance Right   Extremity/Trunk Assessment Upper Extremity Assessment Upper Extremity Assessment: Generalized weakness  Lower Extremity Assessment Lower Extremity Assessment: Generalized weakness       Communication Communication Communication: No difficulties   Cognition Arousal/Alertness: Awake/alert Behavior During Therapy: WFL for tasks assessed/performed, Flat affect Overall Cognitive Status: Within Functional Limits for tasks assessed                                       General Comments  Pt on 3L/min via nasal  cannula with SPO2 >90% but pt endorses SOB, PT educates pt on pursed lip breathing. BP at beginning of session 118/52 mmHg MAP 72            Home Living Family/patient expects to be discharged to:: Private residence Living Arrangements: Spouse/significant other Available Help at Discharge: Family;Available 24 hours/day Type of Home: House Home Access: Stairs to enter [employer] of Steps: 4 Entrance Stairs-Rails: Right;Left;Can reach both Home Layout: One level     Bathroom Shower/Tub: Chief Strategy Officer: Standard     Home Equipment: Cane - single point;Shower seat;Grab bars - tub/shower;Adaptive equipment;Patent examiner (4 wheels) Adaptive Equipment: Reacher;Long-handled sponge;Sock aid        Prior Functioning/Environment Prior Level of Function : Independent/Modified Independent             Mobility Comments: Pt reports she "wasn't doing well" 2/2 back pain since back sx at the beginning of this year. Pt reports she was ambulating with 2 canes 2/2 not liking a walker. ADLs Comments: Pt endorses use of AE for self care tasks secondary to back sx in order to be Ind .        OT Problem List: Decreased strength;Decreased activity tolerance;Decreased safety awareness;Impaired balance (sitting and/or standing);Decreased knowledge of use of DME or AE      OT Treatment/Interventions: Self-care/ADL training;Therapeutic exercise;Therapeutic activities;DME and/or AE instruction;Patient/family education;Balance training;Energy conservation    OT Goals(Current goals can be found in the care plan section) Acute Rehab OT Goals Patient Stated Goal: to get stronger and go home OT Goal Formulation: With patient Time For Goal Achievement: 05/22/22 Potential to Achieve Goals: Good ADL Goals Pt Will Perform Grooming: with modified independence;standing Pt Will Perform Lower Body Dressing: with modified independence;sit to/from stand Pt Will  Transfer to Toilet: with modified independence;ambulating Pt Will Perform Toileting - Clothing Manipulation and hygiene: with modified independence;sit to/from stand  OT Frequency: Min 2X/week       AM-PAC OT "6 Clicks" Daily Activity     Outcome Measure Help from another person eating meals?: None Help from another person taking care of personal grooming?: A Little Help from another person toileting, which includes using toliet, bedpan, or urinal?: A Little Help from another person bathing (including washing, rinsing, drying)?: A Little Help from another person to put on and taking off regular upper body clothing?: None Help from another person to put on and taking off regular lower body clothing?: A Little 6 Click Score: 20   End of Session Nurse Communication: Mobility status  Activity Tolerance: Patient tolerated treatment well Patient left: in bed;with call bell/phone within reach;with bed alarm set;with family/visitor present  OT Visit Diagnosis: Repeated falls (R29.6);Muscle weakness (generalized) (M62.81);Unsteadiness on feet (R26.81)                Time: 2130-8657 OT Time Calculation (min): 15 min Charges:  OT General Charges $OT Visit: 1 Visit OT Evaluation $OT Eval Moderate Complexity: 1 Mod OT Treatments $  Therapeutic Activity: 8-22 mins Jackquline Denmark, MS, OTR/L , CBIS ascom [phone]  05/08/22, 2:55 PM

## 2022-05-08 NOTE — Care Management Important Message (Signed)
Important Message  Patient Details  Name: Molly Mitchell MRN: [id number] Date of Birth: 1945-08-29   Medicare Important Message Given:  Yes  Patient asleep upon time of visit, no family in room.  Copy of Medicare IM left in room for reference.   Johnell Comings 05/08/2022, 11:53 AM

## 2022-05-09 ENCOUNTER — Encounter (INDEPENDENT_AMBULATORY_CARE_PROVIDER_SITE_OTHER): Payer: Medicare HMO

## 2022-05-09 ENCOUNTER — Ambulatory Visit (INDEPENDENT_AMBULATORY_CARE_PROVIDER_SITE_OTHER): Payer: Medicare HMO | Admitting: Nurse Practitioner

## 2022-05-09 DIAGNOSIS — I10 Essential (primary) hypertension: Secondary | ICD-10-CM | POA: Diagnosis not present

## 2022-05-09 DIAGNOSIS — A419 Sepsis, unspecified organism: Secondary | ICD-10-CM | POA: Diagnosis not present

## 2022-05-09 DIAGNOSIS — J189 Pneumonia, unspecified organism: Secondary | ICD-10-CM | POA: Diagnosis not present

## 2022-05-09 DIAGNOSIS — J4520 Mild intermittent asthma, uncomplicated: Secondary | ICD-10-CM | POA: Diagnosis not present

## 2022-05-09 LAB — BASIC METABOLIC PANEL
Anion gap: 11 (ref 5–15)
BUN: 30 mg/dL — ABNORMAL HIGH (ref 8–23)
CO2: 27 mmol/L (ref 22–32)
Calcium: 8.8 mg/dL — ABNORMAL LOW (ref 8.9–10.3)
Chloride: 100 mmol/L (ref 98–111)
Creatinine, Ser: 1.24 mg/dL — ABNORMAL HIGH (ref 0.44–1.00)
GFR, Estimated: 45 mL/min — ABNORMAL LOW (ref >60)
Glucose, Bld: 124 mg/dL — ABNORMAL HIGH (ref 70–99)
Potassium: 3.9 mmol/L (ref 3.5–5.1)
Sodium: 138 mmol/L (ref 135–145)

## 2022-05-09 LAB — CULTURE, BLOOD (ROUTINE X 2)

## 2022-05-09 LAB — MAGNESIUM: Magnesium: 2 mg/dL (ref 1.7–2.4)

## 2022-05-09 MED ORDER — METOPROLOL TARTRATE 5 MG/5ML IV SOLN
2.5000 mg | Freq: Once | INTRAVENOUS | Status: AC
  Administered 2022-05-09: 2.5 mg via INTRAVENOUS
  Filled 2022-05-09: qty 5

## 2022-05-09 MED ORDER — POTASSIUM CHLORIDE 20 MEQ PO PACK
40.0000 meq | PACK | Freq: Once | ORAL | Status: DC
  Filled 2022-05-09: qty 2

## 2022-05-09 MED ORDER — SODIUM CHLORIDE 0.9 % IV SOLN
6.2500 mg | Freq: Four times a day (QID) | INTRAVENOUS | Status: DC | PRN
  Administered 2022-05-09: 6.25 mg via INTRAVENOUS
  Filled 2022-05-09: qty 0.25

## 2022-05-09 MED ORDER — METOPROLOL TARTRATE 25 MG PO TABS
12.5000 mg | ORAL_TABLET | Freq: Three times a day (TID) | ORAL | Status: DC
  Administered 2022-05-09 (×2): 12.5 mg via ORAL
  Filled 2022-05-09 (×3): qty 1

## 2022-05-09 MED ORDER — PROCHLORPERAZINE EDISYLATE 10 MG/2ML IJ SOLN
5.0000 mg | Freq: Four times a day (QID) | INTRAMUSCULAR | Status: DC | PRN
  Administered 2022-05-09: 5 mg via INTRAVENOUS
  Filled 2022-05-09 (×2): qty 1

## 2022-05-09 NOTE — Progress Notes (Addendum)
PROGRESS NOTE    Molly Mitchell   ZOX:[id number] DOB: 1945-12-13  DOA: 04/27/2022 Date of Service: 05/09/22 PCP: [facility]     Brief Narrative / Hospital Course:  Molly Mitchell is a 77 y.o. female with medical history significant of A-fib on Eliquis, s/p of Watchman left atrial appendage closure device placement on 04/20/23, hypertension, hyperlipidemia, asthma, diastolic CHF, stroke, CKD-3A, chronic pain, carotid artery stenosis, who presents to the ED from home 04/25/2022 with cough, shortness of breath, chest pain x2 days. Patient recently had Watchman left atrial appendage closure device placement on 04/20/23.  04/17: atrial fibrillation with RVR, heart rate up to 137. Required cardizem drip in ED. Negative PCR for COVID, flu and RSV, WBC 13.7, lactic acid 1.3, procalcitonin 0.19. Urinalysis (cloudy appearance, small amount of leukocyte, negative nitrite, WBC 0-5 with squamous cell contamination 21-50), slightly worsening renal function, BNP 716, trop 6 --> 5. Temperature normal, blood pressure 102/74, RR 25-37, oxygen saturation 97% on room air. Pt is admitted PCU as inpt. Tx for Afib RVR and sepsis d/t CAP.  04/18: significantly anxious, became over sedated w/ valium, low BP, improved in afternoon. Echo reviewed from Watchman device placement - TEE 04/20/22 LVEF 55-60% normal LV and RV fxn, LA mod dilated. Cardiology to adjust rate/rhythm control --> d/c dilt gtt, start po diltiazem, change coreg to metoprolol 12.5 mg po q6h, started amiodarone bolus/gtt. Remained very anxious - alprazolam lower dose administered.  04/19:  converted to sinus rhythm about mid-day. Concern worsening infiltrate on CXR, add azithromycin. 04/20: amio gtt d/c overnight d/t hypotension/bradycardia.CXR personally reviewed - infiltrate worse on L appears more c/w edema/vascular congestion, trial another dose lasix 40 mg IV diuresis. RVR recurred, restarted amiodarone gtt lower dose since had po amio also, BP a  bit low so holding metoprolol po for now. Echo ordered.  04/21: Echo reviewed - EF 60-65%, indeterminate diastolic parameters, enlarged RV, moderately dilated LA and RA, small pericardial effusion w/o tamponade. BP soft. Maintain amio gtt, added midodrine rather than add fluids.  04/22: Overnight HR to 146, given IV metoprolol and converted to sinus but hypotensive, got IV fluids, amio gtt held. Converted to sinus. Somnolent but rousable, reduced/adjusted sedating medications.  04/23: remains in/out Afib RVR. Increased metoprolol frequency. Cardiology hesitant on DCCV - may need repeat imaging to r/o thrombus with recent Watchman placement   Consultants:  Cardiology   Procedures: None       ASSESSMENT & PLAN:   Principal Problem:   CAP (community acquired pneumonia) Active Problems:   Sepsis   Chronic diastolic CHF (congestive heart failure)   Chronic atrial fibrillation with RVR   Asthma   HTN (hypertension)   Chronic pain   HLD (hyperlipidemia)   Chronic kidney disease, stage 3a   Normocytic anemia   Sepsis due to CAP (community acquired pneumonia)  vs SIRS d/t Afib RVR and anxiety  Elevated HR and RR can be either/both etiology Met criteria with WBC 13.6, heart rate up to 137 and RR 25-37.  Lactic acid normal 1.3.  Procalcitonin 0.19.   Slightly worsening renal function on admission with creatinine 1.23, BUN 42, GFR 46 (recent baseline creatinine 1.14 on 03/22/2022).    Urine legionella and S. pneumococcal antigen - neg Treat as below   Community Acquired Pneumonia  IV Rocephin and doxycycline to cover PNA, changed doxy to azithromycin Mucinex for cough  Bronchodilators CXR prn  Chronic atrial fibrillation with acute RVR Amiodarone gtt per cardiology d/c as of 01:00  05/08/22  PO amio 200mg  BID for 14 days, then 200mg  daily thereafter.  Restarted metoprolol and titrating this  Eliquis 5 mg po bid --> continue eliquis 5mg  BID x 45 days following Watchman as per EP   Cardiology following, appreciate assistance    Acute hypoxic respiratory failure  Supplemental O2 as needed   Chronic diastolic CHF (congestive heart failure), w/ exacerbation  2D echo on 04/20/2022 showed EF of 55-60%.  S/p lasix, which seems to have helped respiratory status somewhat Strict I&O ordered but no significant output has been documented   Severe anxiety Sensitivity to benzodiazepine (oversedation w/ Valium)  Scheduled BuSpar  Nausea Compazine given refractory Consider phenergan qhs prn   Asthma Bronchodilators   HTN (hypertension): IV hydralazine as needed Meds as above    Chronic pain Continue as needed Norco, reduced dose  Scheduled reduced dose tizanidine  As needed Tylenol   HLD (hyperlipidemia) Pravastatin   AKI on Chronic kidney disease, stage 3a:  Monitor BMP   Normocytic anemia:  Hemoglobin 10.5 (12.1 on 03/22/2022), no active bleeding. Follow-up with CBC   DVT prophylaxis: Eliquis Pertinent IV fluids/nutrition: no continuous IV fluids at this time  Central lines / invasive devices: none  Code Status: FULL CODE ACP documents reviewed: none on file   Current Admission Status: inpatient, progressive unit d/t RVR difficult to control TOC needs / Dispo plan: anticipate d/c to previous home environment w/ HH Barriers to discharge / significant pending items: pending clinical improvement expect will be here 1-2 more days / pending cardiology clearance and possible repeat imaging / DCCV             Subjective / Brief ROS:  Patient is alert, but sleepy, reports back pain didn't sleep well last night, now c/o nausea.  SOB is better, no chest pain Pain controlled.  Denies new weakness.    Family Communication: husband at bedside on rounds     Objective Findings:  Vitals:   05/09/22 0812 05/09/22 1000 05/09/22 1121 05/09/22 1130  BP:  (!) 127/100 99/73 103/89  Pulse:   100 (!) 119  Resp:   20   Temp:   98.1 F (36.7 C)    TempSrc:   Oral   SpO2: 99%  95%   Weight:      Height:        Intake/Output Summary (Last 24 hours) at 05/09/2022 1512 Last data filed at 05/09/2022 1420 Gross per 24 hour  Intake 265 ml  Output --  Net 265 ml    Filed Weights   05/06/22 1000 05/07/22 0324 05/08/22 0500  Weight: 68 kg 70 kg 70.5 kg    Examination:  Physical Exam Constitutional:      Appearance: She is not toxic-appearing.  Cardiovascular:     Rate and Rhythm: Normal rate. Rhythm irregular.     Heart sounds: Murmur heard.     Systolic murmur is present.  Pulmonary:     Effort: No tachypnea, accessory muscle usage or respiratory distress.  Musculoskeletal:     Right lower leg: No edema.     Left lower leg: No edema.  Skin:    General: Skin is warm and dry.  Neurological:     General: No focal deficit present.     Mental Status: She is alert and oriented to person, place, and time.  Psychiatric:        Mood and Affect: Mood is anxious.          Scheduled Medications:  amiodarone  200 mg Oral BID   Followed by   Melene Muller ON 05/18/2022] amiodarone  200 mg Oral Daily   apixaban  5 mg Oral BID   busPIRone  5 mg Oral TID   metoprolol tartrate  12.5 mg Oral TID   midodrine  5 mg Oral TID WC   potassium chloride  40 mEq Oral Once   pravastatin  40 mg Oral QHS   tiZANidine  2 mg Oral TID    Continuous Infusions:  promethazine (PHENERGAN) injection (IM or IVPB)       PRN Medications:  acetaminophen, albuterol, dextromethorphan-guaiFENesin, HYDROcodone-acetaminophen, ondansetron (ZOFRAN) IV, prochlorperazine, promethazine (PHENERGAN) injection (IM or IVPB)  Antimicrobials from admission:  Anti-infectives (From admission, onward)    Start     Dose/Rate Route Frequency Ordered Stop   05/08/22 1415  azithromycin (ZITHROMAX) tablet 500 mg        500 mg Oral Daily 05/08/22 1324 05/09/22 0809   05/05/22 1600  azithromycin (ZITHROMAX) 500 mg in sodium chloride 0.9 % 250 mL IVPB  Status:   Discontinued        500 mg 250 mL/hr over 60 Minutes Intravenous Every 24 hours 05/05/22 1515 05/08/22 1324   04/21/2022 1900  doxycycline (VIBRA-TABS) tablet 100 mg  Status:  Discontinued        100 mg Oral Every 12 hours 04/23/2022 1129 05/07/22 1219   04/23/2022 1200  cefTRIAXone (ROCEPHIN) 2 g in sodium chloride 0.9 % 100 mL IVPB        2 g 200 mL/hr over 30 Minutes Intravenous Every 24 hours 04/19/2022 1129 05/09/22 1306   05/14/2022 0600  doxycycline (VIBRAMYCIN) 100 mg in sodium chloride 0.9 % 250 mL IVPB        100 mg 125 mL/hr over 120 Minutes Intravenous  Once 05/02/2022 0459 05/16/2022 0810           Data Reviewed:  I have personally reviewed the following...  CBC: Recent Labs  Lab 05/13/2022 0329 05/04/22 0418 05/05/22 0502 05/07/22 0520  WBC 13.6* 13.6* 19.4* 10.2  NEUTROABS 9.9*  --   --   --   HGB 10.5* 10.1* 10.2* 9.7*  HCT 32.7* 31.4* 31.7* 30.7*  MCV 90.8 91.5 90.6 91.6  PLT 455* 419* 490* 407*   Basic Metabolic Panel: Recent Labs  Lab 05/04/22 0418 05/05/22 0502 05/06/22 0435 05/07/22 0520 05/08/22 0441 05/09/22 0407  NA 137 137 136 136 136 138  K 4.6 4.5 4.1 3.5 3.8 3.9  CL 104 101 102 100 101 100  CO2 23 22 22 24 25 27   GLUCOSE 145* 137* 110* 105* 117* 124*  BUN 35* 45* 45* 40* 36* 30*  CREATININE 1.19* 1.57* 1.53* 1.21* 1.32* 1.24*  CALCIUM 8.7* 8.7* 8.0* 8.3* 8.4* 8.8*  MG 2.5*  --   --   --   --  2.0   GFR: Estimated Creatinine Clearance: 35.5 mL/min (A) (by C-G formula based on SCr of 1.24 mg/dL (H)). Liver Function Tests: Recent Labs  Lab 05/14/2022 0329 05/05/22 0502  AST 58* 20  ALT 55* 32  ALKPHOS 312* 248*  BILITOT 0.6 0.4  PROT 7.6 7.4  ALBUMIN 3.4* 3.3*   Recent Labs  Lab 04/28/2022 0329  LIPASE 26   No results for input(s): "AMMONIA" in the last 168 hours. Coagulation Profile: No results for input(s): "INR", "PROTIME" in the last 168 hours. Cardiac Enzymes: No results for input(s): "CKTOTAL", "CKMB", "CKMBINDEX", "TROPONINI"  in the last 168 hours. BNP (last 3 results)  No results for input(s): "PROBNP" in the last 8760 hours. HbA1C: No results for input(s): "HGBA1C" in the last 72 hours. CBG: No results for input(s): "GLUCAP" in the last 168 hours. Lipid Profile: No results for input(s): "CHOL", "HDL", "LDLCALC", "TRIG", "CHOLHDL", "LDLDIRECT" in the last 72 hours. Thyroid Function Tests: No results for input(s): "TSH", "T4TOTAL", "FREET4", "T3FREE", "THYROIDAB" in the last 72 hours.  Anemia Panel: No results for input(s): "VITAMINB12", "FOLATE", "FERRITIN", "TIBC", "IRON", "RETICCTPCT" in the last 72 hours. Most Recent Urinalysis On File:     Component Value Date/Time   COLORURINE YELLOW (A) 04/30/2022 0811   APPEARANCEUR CLOUDY (A) 05/06/2022 0811   APPEARANCEUR Clear 02/19/2013 0916   LABSPEC 1.018 04/28/2022 0811   LABSPEC 1.010 02/19/2013 0916   PHURINE 5.0 04/27/2022 0811   GLUCOSEU NEGATIVE 04/18/2022 0811   GLUCOSEU Negative 02/19/2013 0916   HGBUR NEGATIVE 04/27/2022 0811   BILIRUBINUR NEGATIVE 04/17/2022 0811   BILIRUBINUR negative 10/23/2017 1324   BILIRUBINUR Negative 02/19/2013 0916   KETONESUR NEGATIVE 05/02/2022 0811   PROTEINUR 30 (A) 05/02/2022 0811   UROBILINOGEN 0.2 10/23/2017 1324   NITRITE NEGATIVE 04/20/2022 0811   LEUKOCYTESUR SMALL (A) 04/25/2022 0811   LEUKOCYTESUR Negative 02/19/2013 0916   Sepsis Labs: (procalcitonin:4,lacticidven:4) Microbiology: Recent Results (from the past 240 hour(s))  Culture, blood (routine x 2)     Status: None   Collection Time: 04/19/2022  5:37 AM   Specimen: BLOOD RIGHT ARM  Result Value Ref Range Status   Specimen Description BLOOD RIGHT ARM  Final   Special Requests   Final    BOTTLES DRAWN AEROBIC AND ANAEROBIC Blood Culture adequate volume   Culture   Final    NO GROWTH 5 DAYS Performed at [facility], [address] [place], [place] [address]    Report Status 05/08/2022 FINAL  Final  Resp panel by RT-PCR  (RSV, Flu A&B, Covid) Anterior Nasal Swab     Status: None   Collection Time: 05/12/2022  5:37 AM   Specimen: Anterior Nasal Swab  Result Value Ref Range Status   SARS Coronavirus 2 by RT PCR NEGATIVE NEGATIVE Final    Comment: (NOTE) SARS-CoV-2 target nucleic acids are NOT DETECTED.  The SARS-CoV-2 RNA is generally detectable in upper respiratory specimens during the acute phase of infection. The lowest concentration of SARS-CoV-2 viral copies this assay can detect is 138 copies/mL. A negative result does not preclude SARS-Cov-2 infection and should not be used as the sole basis for treatment or other patient management decisions. A negative result may occur with  improper specimen collection/handling, submission of specimen other than nasopharyngeal swab, presence of viral mutation(s) within the areas targeted by this assay, and inadequate number of viral copies(<138 copies/mL). A negative result must be combined with clinical observations, patient history, and epidemiological information. The expected result is Negative.  Fact Sheet for Patients:  BloggerCourse.com  Fact Sheet for Healthcare Providers:  SeriousBroker.it  This test is no t yet approved or cleared by the [place] FDA and  has been authorized for detection and/or diagnosis of SARS-CoV-2 by FDA under an Emergency Use Authorization (EUA). This EUA will remain  in effect (meaning this test can be used) for the duration of the COVID-19 declaration under Section 564(b)(1) of the Act, 21 U.S.C.section 360bbb-3(b)(1), unless the authorization is terminated  or revoked sooner.       Influenza A by PCR NEGATIVE NEGATIVE Final   Influenza B by PCR NEGATIVE NEGATIVE Final    Comment: (NOTE)  The Xpert Xpress SARS-CoV-2/FLU/RSV plus assay is intended as an aid in the diagnosis of influenza from Nasopharyngeal swab specimens and should not be used as a sole basis for  treatment. Nasal washings and aspirates are unacceptable for Xpert Xpress SARS-CoV-2/FLU/RSV testing.  Fact Sheet for Patients: BloggerCourse.com  Fact Sheet for Healthcare Providers: SeriousBroker.it  This test is not yet approved or cleared by the [place] FDA and has been authorized for detection and/or diagnosis of SARS-CoV-2 by FDA under an Emergency Use Authorization (EUA). This EUA will remain in effect (meaning this test can be used) for the duration of the COVID-19 declaration under Section 564(b)(1) of the Act, 21 U.S.C. section 360bbb-3(b)(1), unless the authorization is terminated or revoked.     Resp Syncytial Virus by PCR NEGATIVE NEGATIVE Final    Comment: (NOTE) Fact Sheet for Patients: BloggerCourse.com  Fact Sheet for Healthcare Providers: SeriousBroker.it  This test is not yet approved or cleared by the [place] FDA and has been authorized for detection and/or diagnosis of SARS-CoV-2 by FDA under an Emergency Use Authorization (EUA). This EUA will remain in effect (meaning this test can be used) for the duration of the COVID-19 declaration under Section 564(b)(1) of the Act, 21 U.S.C. section 360bbb-3(b)(1), unless the authorization is terminated or revoked.  Performed at [facility], [address] [place], [place] [address]   Culture, blood (routine x 2)     Status: None   Collection Time: 05/04/22  2:54 PM   Specimen: BLOOD  Result Value Ref Range Status   Specimen Description BLOOD BLOOD LEFT HAND  Final   Special Requests   Final    BOTTLES DRAWN AEROBIC AND ANAEROBIC Blood Culture adequate volume   Culture   Final    NO GROWTH 5 DAYS Performed at [facility], [address], [place], [place] [address]    Report Status 05/09/2022 FINAL  Final      Radiology Studies last 3 days: ECHOCARDIOGRAM COMPLETE  Result  Date: 05/06/2022    ECHOCARDIOGRAM REPORT   Patient Name:   Molly Mitchell Date of Exam: 05/06/2022 Medical Rec #:  [id number]      Height:       62.0 in Accession #:    [id number]     Weight:       149.9 lb Date of Birth:  1945-08-25       BSA:          1.691 m Patient Age:    77 years       BP:           95/65 mmHg Patient Gender: F              HR:           107 bpm. Exam Location:  [facility] Procedure: 2D Echo Indications:     Atrial Fibrillation I48.91  History:         Patient has prior history of Echocardiogram examinations, most                  recent 04/20/2022.  Sonographer:     Overton Mam RDCS, FASE Referring Phys:  Lajuana Carry A KHAN Diagnosing Phys: Adrian Blackwater  Sonographer Comments: Technically difficult study due to poor echo windows. Image acquisition challenging due to respiratory motion. The patient had coughing spells throughout this study. IMPRESSIONS  1. Left ventricular ejection fraction, by estimation, is 60 to 65%. The left ventricle has normal function. The left ventricle  has no regional wall motion abnormalities. There is mild concentric left ventricular hypertrophy. Left ventricular diastolic parameters are indeterminate.  2. Right ventricular systolic function is normal. The right ventricular size is moderately enlarged.  3. Left atrial size was moderately dilated.  4. Right atrial size was moderately dilated.  5. A small pericardial effusion is present. The pericardial effusion is circumferential. There is no evidence of cardiac tamponade.  6. The mitral valve is normal in structure. Mild mitral valve regurgitation. No evidence of mitral stenosis.  7. The aortic valve is normal in structure. Aortic valve regurgitation is not visualized. No aortic stenosis is present.  8. The inferior vena cava is normal in size with greater than 50% respiratory variability, suggesting right atrial pressure of 3 mmHg. FINDINGS  Left Ventricle: Left ventricular ejection fraction, by estimation, is 60 to  65%. The left ventricle has normal function. The left ventricle has no regional wall motion abnormalities. The left ventricular internal cavity size was normal in size. There is  mild concentric left ventricular hypertrophy. Left ventricular diastolic parameters are indeterminate. Right Ventricle: The right ventricular size is moderately enlarged. No increase in right ventricular wall thickness. Right ventricular systolic function is normal. Left Atrium: Left atrial size was moderately dilated. Right Atrium: Right atrial size was moderately dilated. Pericardium: A small pericardial effusion is present. The pericardial effusion is circumferential. There is no evidence of cardiac tamponade. Mitral Valve: The mitral valve is normal in structure. Mild mitral valve regurgitation. No evidence of mitral valve stenosis. Tricuspid Valve: The tricuspid valve is normal in structure. Tricuspid valve regurgitation is mild . No evidence of tricuspid stenosis. Aortic Valve: The aortic valve is normal in structure. Aortic valve regurgitation is not visualized. No aortic stenosis is present. Aortic valve peak gradient measures 10.9 mmHg. Pulmonic Valve: The pulmonic valve was normal in structure. Pulmonic valve regurgitation is not visualized. No evidence of pulmonic stenosis. Aorta: The aortic root is normal in size and structure. Venous: The inferior vena cava is normal in size with greater than 50% respiratory variability, suggesting right atrial pressure of 3 mmHg. IAS/Shunts: No atrial level shunt detected by color flow Doppler.  LEFT VENTRICLE PLAX 2D LVIDd:         3.80 cm   Diastology LVIDs:         2.70 cm   LV e' medial:    13.40 cm/s LV PW:         1.00 cm   LV E/e' medial:  6.7 LV IVS:        1.00 cm   LV e' lateral:   8.27 cm/s LVOT diam:     1.80 cm   LV E/e' lateral: 10.8 LV SV:         58 LV SV Index:   34 LVOT Area:     2.54 cm  RIGHT VENTRICLE RV Basal diam:  2.80 cm RV S prime:     12.10 cm/s TAPSE (M-mode): 1.0  cm LEFT ATRIUM           Index        RIGHT ATRIUM           Index LA diam:      3.60 cm 2.13 cm/m   RA Area:     16.20 cm LA Vol (A4C): 30.2 ml 17.86 ml/m  RA Volume:   45.30 ml  26.78 ml/m  AORTIC VALVE  PULMONIC VALVE AV Area (Vmax): 1.77 cm     PV Vmax:        1.04 m/s AV Vmax:        165.00 cm/s  PV Peak grad:   4.3 mmHg AV Peak Grad:   10.9 mmHg    RVOT Peak grad: 3 mmHg LVOT Vmax:      115.00 cm/s LVOT Vmean:     75.800 cm/s LVOT VTI:       0.226 m  AORTA Ao Root diam: 3.00 cm Ao Asc diam:  3.20 cm MITRAL VALVE               TRICUSPID VALVE MV Area (PHT): 2.94 cm    TR Peak grad:   22.3 mmHg MV Decel Time: 258 msec    TR Vmax:        236.00 cm/s MV E velocity: 89.60 cm/s                            SHUNTS                            Systemic VTI:  0.23 m                            Systemic Diam: 1.80 cm Adrian Blackwater Electronically signed by Adrian Blackwater Signature Date/Time: 05/06/2022/11:04:36 PM    Final    DG Chest Port 1 View  Result Date: 05/06/2022 CLINICAL DATA:  Shortness of breath EXAM: PORTABLE CHEST 1 VIEW COMPARISON:  05/05/2018 FINDINGS: Cardiac shadow is enlarged but stable. Watchman device is again seen. Postsurgical changes in the thoracolumbar spine are noted. Slight increased central vascular congestion is noted with mild interstitial edema. Small effusions are noted bilaterally. IMPRESSION: Slight increase in vascular congestion with mild interstitial edema. Small effusions are again noted. Electronically Signed   By: Alcide Clever M.D.   On: 05/06/2022 09:29             LOS: 6 days       Sunnie Nielsen, DO [facility] 05/09/2022, 3:12 PM    Dictation software may have been used to generate the above note. Typos may occur and escape review in typed/dictated notes. Please contact Dr Lyn Hollingshead directly for clarity if needed.  Staff may message me via secure chat in Epic  but this may not receive an immediate response,  please page me for  urgent matters!  If 7PM-7AM, please contact night coverage [email or web address]

## 2022-05-09 NOTE — Progress Notes (Signed)
PT Cancellation Note  Patient Details Name: Molly Mitchell MRN: [id number] DOB: Jun 30, 1945   Cancelled Treatment:    Reason Eval/Treat Not Completed:  (Pt in bed on arrival, appears quite anxious, reports considerable nausea all morning, no avail from aniemetics.) HR remains in AF ranging 100s-130s RVR. Pt declines PT at this time, having trouble simply tolerating static sitting. WIll continue to follow, resume services at later date/time once pt is able to tolerate.   1:28 PM, 05/09/22 Rosamaria Lints, PT, DPT Physical Therapist - [facility]  [phone] (ASCOM)     [name] 05/09/2022, 1:27 PM

## 2022-05-09 NOTE — Progress Notes (Signed)
[facility] CLINIC CARDIOLOGY CONSULT NOTE       Patient ID: Molly Mitchell MRN: [id number] DOB/AGE: May 27, 1945 77 y.o.  Admit date: 04/28/2022 Referring Physician Dr. Sunnie Nielsen  Primary Physician Dr. Nemiah Commander Primary Cardiologist Minda Ditto, PA-C  Reason for Consultation AF RVR  HPI: Molly Mitchell is a 77yoF with a PMH of paroxysmal AF s/p recent Watchman (LAAO closure) placement 4/4, HFpEF, right CEA (09/2021), s/p spinal fusion who presented to [facility] ED 05/13/2022 with cough, shortness of breath, and chest pain for 2 days. Initial concern for CAP, but symptoms ultimately felt to be more related to acute on chronic HFpEF and decompensation from atrial fibrillation with RVR.  Cardiology is consulted for further assistance.  Interval History:  - didn't sleep well last night d/t back pain - no chest pain. Some conversational dyspnea and doesn't want to speak much to me. No heart racing or palpitations - in NSR overnight, converts to AF RVR with rate uncontrolled in the 120s-140s this AM.  - renal function stable    Past Medical History:  Diagnosis Date   Acute right MCA stroke 08/01/2021   a.) noted on MRI head 08/01/2021 --> punctate acute RIGHT MCA territory infarcts affecting the frontal and parietal contex consistent with microembolic infarcts in the RIGHT carotid circulation   Anemia    Arthritis    Bilateral carotid artery disease 08/18/2021   a.) CTA neck 08/18/2021: 70-80% RICA and 40% LICA   Cervicalgia    Chronic kidney disease    Chronic pain syndrome    Chronic, continuous use of opioids    a.) hydrocodone/APAP (Norco 10/325 mg)   Connective tissue disease    pt reports being diagnosed with undifferentiated connective tissue disease at some point- pt took steroids for this and has had no issues since- 10 years ago   Coronary artery disease    Cortical senile cataract    COVID 2021   DDD (degenerative disc disease), cervical    a.) s/p C4-C6 ACDF   DDD  (degenerative disc disease), lumbar    a.) s/p laminectomy + posterior L2-S1 fusion   Dysrhythmia    A-fib   History of 2019 novel coronavirus disease (COVID-19) 12/07/2018   History of blood transfusion    History of heart murmur in childhood    History of rheumatic fever    HLD (hyperlipidemia)    Hypertension    Lower extremity weakness    a.) with position changes from sitting to standing   Osteoporosis    Presence of Watchman left atrial appendage closure device 04/20/2022   27mm Watchman FLX placed by Dr. Lalla Brothers   Pseudogout    Shingles    Skin cancer of face    Spondylolisthesis of lumbar region     Past Surgical History:  Procedure Laterality Date   ABDOMINAL HYSTERECTOMY  1981   AIKEN OSTEOTOMY Right 01/30/2018   Procedure: WEIL RIGHT 2ND AND 3RD;  Surgeon: Gwyneth Revels, DPM;  Location: [facility];  Service: Podiatry;  Laterality: Right;   ANTERIOR CERVICAL DECOMP/DISCECTOMY FUSION N/A 2003   C4-C6   BACK SURGERY     pt reports hx of 3 lumber back surgeries total   CERVICAL SPINE SURGERY     pt reports having 3 cervical fusions total   ENDARTERECTOMY Right 09/21/2021   Procedure: ENDARTERECTOMY CAROTID;  Surgeon: Renford Dills, MD;  Location: [facility];  Service: Vascular;  Laterality: Right;  Right carotid   HAMMER TOE SURGERY Right 01/30/2018  Procedure: HAMMER TOE CORRECTION SECOND AND THIRD;  Surgeon: Gwyneth Revels, DPM;  Location: [facility];  Service: Podiatry;  Laterality: Right;  GENERAL WITH LOCAL   HAMMER TOE SURGERY Left 03/05/2019   Procedure: HAMMER TOE CORRECTION T1 AND T2;  Surgeon: Gwyneth Revels, DPM;  Location: [facility];  Service: Podiatry;  Laterality: Left;   LEFT ATRIAL APPENDAGE OCCLUSION N/A 04/20/2022   Procedure: LEFT ATRIAL APPENDAGE OCCLUSION;  Surgeon: Lanier Prude, MD;  Location: [facility];  Service: Cardiovascular;  Laterality: N/A;   POSTERIOR LUMBAR FUSION N/A 2001   L2-S1   ROTATOR  CUFF REPAIR Right 1981   SACROPLASTY N/A 03/29/2017   S1   TEE WITHOUT CARDIOVERSION N/A 04/20/2022   Procedure: TRANSESOPHAGEAL ECHOCARDIOGRAM;  Surgeon: Lanier Prude, MD;  Location: [facility] INVASIVE CV LAB;  Service: Cardiovascular;  Laterality: N/A;   TOTAL KNEE ARTHROPLASTY Left 03/03/2013   TOTAL KNEE REVISION Left 07/31/2016   Procedure: TOTAL KNEE REVISION;  Surgeon: Donato Heinz, MD;  Location: [facility];  Service: Orthopedics;  Laterality: Left;   WEIL OSTEOTOMY Left 03/05/2019   Procedure: WEIL OSTEOTOMY X 2 LEFT;  Surgeon: Gwyneth Revels, DPM;  Location: [facility];  Service: Podiatry;  Laterality: Left;    Medications Prior to Admission  Medication Sig Dispense Refill Last Dose   amLODipine (NORVASC) 2.5 MG tablet Take 2.5-5 mg by mouth See admin instructions. Take 2.5 mg in the morning and 5 mg at bedtime   Past Week   amoxicillin (AMOXIL) 500 MG capsule Take 4 tablets by mouth 1 hour prior to dental procedures. 12 capsule 3 unknown   apixaban (ELIQUIS) 5 MG TABS tablet Take 1 tablet (5 mg total) by mouth 2 (two) times daily. 60 tablet  05/02/2022   carvedilol (COREG) 3.125 MG tablet Take 3.125 mg by mouth 2 (two) times daily with a meal.   Past Week   HYDROcodone-acetaminophen (NORCO/VICODIN) 5-325 MG tablet Take 2 tablets by mouth 4 (four) times daily.   Past Week   pravastatin (PRAVACHOL) 40 MG tablet Take 40 mg by mouth at bedtime.   05/02/2022   telmisartan (MICARDIS) 80 MG tablet Take 80 mg by mouth daily.   Past Week   tiZANidine (ZANAFLEX) 4 MG tablet Take 1 tablet (4 mg total) by mouth 2 (two) times daily.   05/02/2022    Social History   Socioeconomic History   Marital status: Married    Spouse name: [name]   Number of children: 2   Years of education: Not on file   Highest education level: High school graduate  Occupational History   Occupation: retired   Occupation: Retired Engineer, [employer]  Tobacco Use   Smoking status: Former     Packs/day: 1.00    Years: 15.00    Additional pack years: 0.00    Total pack years: 15.00    Types: Cigarettes    Quit date: 04/17/1979    Years since quitting: 43.0   Smokeless tobacco: Never  Vaping Use   Vaping Use: Never used  Substance and Sexual Activity   Alcohol use: No    Alcohol/week: 0.0 standard drinks of alcohol   Drug use: No   Sexual activity: Not on file  Other Topics Concern   Not on file  Social History Narrative   Not on file   Social Determinants of Health   Financial Resource Strain: Low Risk  (03/23/2020)   Overall Financial Resource Strain (CARDIA)  Difficulty of Paying Living Expenses: Not hard at all  Food Insecurity: No Food Insecurity (05/04/2022)   Hunger Vital Sign    Worried About Running Out of Food in the Last Year: Never true    Ran Out of Food in the Last Year: Never true  Transportation Needs: No Transportation Needs (05/04/2022)   PRAPARE - Administrator, Civil Service (Medical): No    Lack of Transportation (Non-Medical): No  Physical Activity: Sufficiently Active (03/23/2020)   Exercise Vital Sign    Days of Exercise per Week: 3 days    Minutes of Exercise per Session: 60 min  Stress: No Stress Concern Present (03/23/2020)   [employer] of Occupational Health - Occupational Stress Questionnaire    Feeling of Stress : Not at all  Social Connections: Socially Integrated (03/23/2020)   Social Connection and Isolation Panel [NHANES]    Frequency of Communication with Friends and Family: Never    Frequency of Social Gatherings with Friends and Family: More than three times a week    Attends Religious Services: More than 4 times per year    Active Member of Clubs or Organizations: Yes    Attends Banker Meetings: More than 4 times per year    Marital Status: Married  Catering manager Violence: Not At Risk (05/04/2022)   Humiliation, Afraid, Rape, and Kick questionnaire    Fear of Current or Ex-Partner: No     Emotionally Abused: No    Physically Abused: No    Sexually Abused: No    Family History  Problem Relation Age of Onset   Hypertension Brother    Hypertension Mother    Hypertension Sister    Cancer Sister    Diabetes Paternal Grandmother    Heart failure Maternal Grandmother       Intake/Output Summary (Last 24 hours) at 05/09/2022 1610 Last data filed at 05/08/2022 1927 Gross per 24 hour  Intake 100 ml  Output --  Net 100 ml     Vitals:   05/09/22 0603 05/09/22 0624 05/09/22 0654 05/09/22 0800  BP:  112/76  131/67  Pulse: (!) 166 (!) 116 (!) 121 (!) 118  Resp: (!) 30 20 17 16   Temp:      TempSrc:      SpO2:  95% 96% 99%  Weight:      Height:        PHYSICAL EXAM General: elderly caucasian female, sitting upright in bed, no family present HEENT:  Normocephalic and atraumatic. Neck:  No JVD.  Lungs: Normal respiratory effort on O2 by Everman. Crackles in left base Heart: tachy irregular irregular . Normal S1 and S2 without gallops or murmurs.  Abdomen: Non-distended appearing.  Msk: Normal strength and tone for age. Extremities: Warm and well perfused. No clubbing, cyanosis. Trace bilateral LE edema.  Neuro: alert and oriented  Psych:  anxious mood, tearful  Labs: Basic Metabolic Panel: Recent Labs    05/08/22 0441 05/09/22 0407  NA 136 138  K 3.8 3.9  CL 101 100  CO2 25 27  GLUCOSE 117* 124*  BUN 36* 30*  CREATININE 1.32* 1.24*  CALCIUM 8.4* 8.8*  MG  --  2.0    Liver Function Tests: No results for input(s): "AST", "ALT", "ALKPHOS", "BILITOT", "PROT", "ALBUMIN" in the last 72 hours.  No results for input(s): "LIPASE", "AMYLASE" in the last 72 hours.  CBC: Recent Labs    05/07/22 0520  WBC 10.2  HGB 9.7*  HCT 30.7*  MCV 91.6  PLT 407*    Cardiac Enzymes: No results for input(s): "CKTOTAL", "CKMB", "CKMBINDEX", "TROPONINIHS" in the last 72 hours.  BNP: No results for input(s): "BNP" in the last 72 hours.  D-Dimer: No results for  input(s): "DDIMER" in the last 72 hours. Hemoglobin A1C: No results for input(s): "HGBA1C" in the last 72 hours. Fasting Lipid Panel: No results for input(s): "CHOL", "HDL", "LDLCALC", "TRIG", "CHOLHDL", "LDLDIRECT" in the last 72 hours. Thyroid Function Tests: No results for input(s): "TSH", "T4TOTAL", "T3FREE", "THYROIDAB" in the last 72 hours.  Invalid input(s): "FREET3"  Anemia Panel: No results for input(s): "VITAMINB12", "FOLATE", "FERRITIN", "TIBC", "IRON", "RETICCTPCT" in the last 72 hours.   Radiology: ECHOCARDIOGRAM COMPLETE  Result Date: 05/06/2022    ECHOCARDIOGRAM REPORT   Patient Name:   Molly Mitchell Date of Exam: 05/06/2022 Medical Rec #:  [id number]      Height:       62.0 in Accession #:    [id number]     Weight:       149.9 lb Date of Birth:  15-Apr-1945       BSA:          1.691 m Patient Age:    77 years       BP:           95/65 mmHg Patient Gender: F              HR:           107 bpm. Exam Location:  [facility] Procedure: 2D Echo Indications:     Atrial Fibrillation I48.91  History:         Patient has prior history of Echocardiogram examinations, most                  recent 04/20/2022.  Sonographer:     Overton Mam RDCS, FASE Referring Phys:  Lajuana Carry A KHAN Diagnosing Phys: Adrian Blackwater  Sonographer Comments: Technically difficult study due to poor echo windows. Image acquisition challenging due to respiratory motion. The patient had coughing spells throughout this study. IMPRESSIONS  1. Left ventricular ejection fraction, by estimation, is 60 to 65%. The left ventricle has normal function. The left ventricle has no regional wall motion abnormalities. There is mild concentric left ventricular hypertrophy. Left ventricular diastolic parameters are indeterminate.  2. Right ventricular systolic function is normal. The right ventricular size is moderately enlarged.  3. Left atrial size was moderately dilated.  4. Right atrial size was moderately dilated.  5. A small pericardial  effusion is present. The pericardial effusion is circumferential. There is no evidence of cardiac tamponade.  6. The mitral valve is normal in structure. Mild mitral valve regurgitation. No evidence of mitral stenosis.  7. The aortic valve is normal in structure. Aortic valve regurgitation is not visualized. No aortic stenosis is present.  8. The inferior vena cava is normal in size with greater than 50% respiratory variability, suggesting right atrial pressure of 3 mmHg. FINDINGS  Left Ventricle: Left ventricular ejection fraction, by estimation, is 60 to 65%. The left ventricle has normal function. The left ventricle has no regional wall motion abnormalities. The left ventricular internal cavity size was normal in size. There is  mild concentric left ventricular hypertrophy. Left ventricular diastolic parameters are indeterminate. Right Ventricle: The right ventricular size is moderately enlarged. No increase in right ventricular wall thickness. Right ventricular systolic function is normal. Left Atrium: Left atrial size was moderately dilated. Right Atrium: Right atrial size  was moderately dilated. Pericardium: A small pericardial effusion is present. The pericardial effusion is circumferential. There is no evidence of cardiac tamponade. Mitral Valve: The mitral valve is normal in structure. Mild mitral valve regurgitation. No evidence of mitral valve stenosis. Tricuspid Valve: The tricuspid valve is normal in structure. Tricuspid valve regurgitation is mild . No evidence of tricuspid stenosis. Aortic Valve: The aortic valve is normal in structure. Aortic valve regurgitation is not visualized. No aortic stenosis is present. Aortic valve peak gradient measures 10.9 mmHg. Pulmonic Valve: The pulmonic valve was normal in structure. Pulmonic valve regurgitation is not visualized. No evidence of pulmonic stenosis. Aorta: The aortic root is normal in size and structure. Venous: The inferior vena cava is normal in size  with greater than 50% respiratory variability, suggesting right atrial pressure of 3 mmHg. IAS/Shunts: No atrial level shunt detected by color flow Doppler.  LEFT VENTRICLE PLAX 2D LVIDd:         3.80 cm   Diastology LVIDs:         2.70 cm   LV e' medial:    13.40 cm/s LV PW:         1.00 cm   LV E/e' medial:  6.7 LV IVS:        1.00 cm   LV e' lateral:   8.27 cm/s LVOT diam:     1.80 cm   LV E/e' lateral: 10.8 LV SV:         58 LV SV Index:   34 LVOT Area:     2.54 cm  RIGHT VENTRICLE RV Basal diam:  2.80 cm RV S prime:     12.10 cm/s TAPSE (M-mode): 1.0 cm LEFT ATRIUM           Index        RIGHT ATRIUM           Index LA diam:      3.60 cm 2.13 cm/m   RA Area:     16.20 cm LA Vol (A4C): 30.2 ml 17.86 ml/m  RA Volume:   45.30 ml  26.78 ml/m  AORTIC VALVE                 PULMONIC VALVE AV Area (Vmax): 1.77 cm     PV Vmax:        1.04 m/s AV Vmax:        165.00 cm/s  PV Peak grad:   4.3 mmHg AV Peak Grad:   10.9 mmHg    RVOT Peak grad: 3 mmHg LVOT Vmax:      115.00 cm/s LVOT Vmean:     75.800 cm/s LVOT VTI:       0.226 m  AORTA Ao Root diam: 3.00 cm Ao Asc diam:  3.20 cm MITRAL VALVE               TRICUSPID VALVE MV Area (PHT): 2.94 cm    TR Peak grad:   22.3 mmHg MV Decel Time: 258 msec    TR Vmax:        236.00 cm/s MV E velocity: 89.60 cm/s                            SHUNTS                            Systemic VTI:  0.23 m  Systemic Diam: 1.80 cm Adrian Blackwater Electronically signed by Adrian Blackwater Signature Date/Time: 05/06/2022/11:04:36 PM    Final    DG Chest Port 1 View  Result Date: 05/06/2022 CLINICAL DATA:  Shortness of breath EXAM: PORTABLE CHEST 1 VIEW COMPARISON:  05/05/2018 FINDINGS: Cardiac shadow is enlarged but stable. Watchman device is again seen. Postsurgical changes in the thoracolumbar spine are noted. Slight increased central vascular congestion is noted with mild interstitial edema. Small effusions are noted bilaterally. IMPRESSION: Slight increase in vascular  congestion with mild interstitial edema. Small effusions are again noted. Electronically Signed   By: Alcide Clever M.D.   On: 05/06/2022 09:29   DG Chest Port 1 View  Result Date: 05/05/2022 CLINICAL DATA:  Shortness of breath. EXAM: PORTABLE CHEST 1 VIEW COMPARISON:  04/18/2022. FINDINGS: Increasing bibasilar opacities, left-greater-than-right, suspicious for aspiration or multifocal pneumonia. Unchanged small left pleural effusion. Stable cardiac and mediastinal contours. No pneumothorax. IMPRESSION: Increasing bibasilar opacities, greater on the left, suspicious for aspiration or multifocal pneumonia. Electronically Signed   By: Orvan Falconer M.D.   On: 05/05/2022 13:20   DG Chest Port 1 View  Result Date: 05/01/2022 CLINICAL DATA:  Chest pain EXAM: PORTABLE CHEST 1 VIEW COMPARISON:  04/20/2022 FINDINGS: Mild bilateral lower lobe opacities, left greater than right, atelectasis versus pneumonia. Small left pleural effusion, new. No frank interstitial edema. Cardiomegaly. Thoracolumbar spine fixation hardware. Cervical spine fixation hardware, incompletely visualized. IMPRESSION: Mild bilateral lower lobe opacities, left greater than right, atelectasis versus pneumonia. Small left pleural effusion. Electronically Signed   By: Charline Bills M.D.   On: 05/02/2022 03:37   DG Chest 2 View  Result Date: 04/22/2022 CLINICAL DATA:  77 year old female, preoperative evaluation. EXAM: CHEST - 2 VIEW COMPARISON:  10/13/2013, 01/30/2022 FINDINGS: The mediastinal contours are within normal limits. No cardiomegaly. The lungs are clear bilaterally without evidence of focal consolidation, pleural effusion, or pneumothorax. Multilevel, incompletely visualized thoracolumbar fusion hardware in place. Lower cervical ACDF hardware in place. No acute osseous abnormality. IMPRESSION: No acute cardiopulmonary process. Electronically Signed   By: Marliss Coots M.D.   On: 04/22/2022 11:34   EP STUDY  Result Date:  04/20/2022 CONCLUSIONS: 1.Successful implantation of a WATCHMAN left atrial appendage occlusive device   2. TEE demonstrating no LAA thrombus 3. No early apparent complications. Post Implant Anticoagulation Strategy: Continue Eliquis 5mg  PO BID x 45 days after implant. After 45 days, stop Eliquis and start Plavix 75mg  PO daily to complete 6 months of post implant therapy. Plan for CT scan 60 days after implant to assess Watchman position.   ECHO TEE  Result Date: 04/20/2022    TRANSESOPHOGEAL ECHO REPORT   Patient Name:   Molly Mitchell Date of Exam: 04/20/2022 Medical Rec #:  [id number]      Height:       62.0 in Accession #:    [id number]     Weight:       145.0 lb Date of Birth:  01-25-1945       BSA:          1.667 m Patient Age:    77 years       BP:           158/56 mmHg Patient Gender: F              HR:           76 bpm. Exam Location:  Inpatient Procedure: Transesophageal Echo, 3D Echo, Color Doppler and Cardiac Doppler Indications:  I48.2 Chronic atrial fibrillation  History:         Patient has prior history of Echocardiogram examinations, most                  recent 09/20/2021. Arrythmias:Atrial Fibrillation; Risk                  Factors:Hypertension and Dyslipidemia.  Sonographer:     Irving Burton Senior RDCS Referring Phys:  [id number] Lanier Prude Diagnosing Phys: Riley Lam MD  Sonographer Comments: 27mm Watchman FLX Implanted PROCEDURE: After discussion of the risks and benefits of a TEE, an informed consent was obtained from the patient. The transesophogeal probe was passed without difficulty through the esophogus of the patient. Sedation performed by different physician. The patient was monitored while under deep sedation. The patient developed no complications during the procedure.  IMPRESSIONS  1. Prior to procedure, patent left atrial appendage. Maximal diameter 2.19 cm (ostial measurements). Sufficient sizing for a 27 mm Watchman FLX device.  2. A mid-posterior transeptal puncture was  performed.  3. a 27 mm Watchman FLX was deployed. Greater than 1/2 shoulder seen. Small tissue compression seen at the anterior mitral valve annulus. No thrombus seen. Device recaptured.  4. At redeployment a 27 mm Watchman FLX was present. No peri-device leak. No thrombus. A 1/3-1/2 mitral shoulder was noted in the 135 view. Average compression ~ 15%.  5. No significant pericardial effusion seen throughout study.  6. Iatrogenic ASD present. Majority of flow was left to right, rare right to left shunting.  7. Left ventricular ejection fraction, by estimation, is 55 to 60%. The left ventricle has normal function.  8. Right ventricular systolic function is normal. The right ventricular size is normal.  9. Left atrial size was moderately dilated. No left atrial/left atrial appendage thrombus was detected. 10. Right atrial size was mildly dilated. 11. The mitral valve is normal in structure. Mild mitral valve regurgitation. 12. The aortic valve is tricuspid. Aortic valve regurgitation is not visualized. No aortic stenosis is present. 13. Evidence of atrial level shunting detected by color flow Doppler. FINDINGS  Left Ventricle: Left ventricular ejection fraction, by estimation, is 55 to 60%. The left ventricle has normal function. The left ventricular internal cavity size was normal in size. Right Ventricle: The right ventricular size is normal. Right vetricular wall thickness was not well visualized. Right ventricular systolic function is normal. Left Atrium: Left atrial size was moderately dilated. No left atrial/left atrial appendage thrombus was detected. Right Atrium: Right atrial size was mildly dilated. Pericardium: There is no evidence of pericardial effusion. Mitral Valve: The mitral valve is normal in structure. Mild mitral valve regurgitation. Tricuspid Valve: The tricuspid valve is normal in structure. Tricuspid valve regurgitation is trivial. No evidence of tricuspid stenosis. Aortic Valve: The aortic valve  is tricuspid. Aortic valve regurgitation is not visualized. No aortic stenosis is present. Pulmonic Valve: The pulmonic valve was normal in structure. Pulmonic valve regurgitation is not visualized. No evidence of pulmonic stenosis. Aorta: The aortic root, ascending aorta, aortic arch and descending aorta are all structurally normal, with no evidence of dilitation or obstruction. IAS/Shunts: Evidence of atrial level shunting detected by color flow Doppler. Additional Comments: Spectral Doppler performed. Riley Lam MD Electronically signed by Riley Lam MD Signature Date/Time: 04/20/2022/10:50:03 AM    Final     ECHO - TEE A04/04/2022  1. Prior to procedure, patent left atrial appendage. Maximal diameter  2.19 cm (ostial measurements). Sufficient sizing for a 27 mm  Watchman FLX  device.   2. A mid-posterior transeptal puncture was performed.   3. a 27 mm Watchman FLX was deployed. Greater than 1/2 shoulder seen.  Small tissue compression seen at the anterior mitral valve annulus. No  thrombus seen. Device recaptured.   4. At redeployment a 27 mm Watchman FLX was present. No peri-device leak.  No thrombus. A 1/3-1/2 mitral shoulder was noted in the 135 view. Average  compression ~ 15%.   5. No significant pericardial effusion seen throughout study.   6. Iatrogenic ASD present. Majority of flow was left to right, rare right  to left shunting.   7. Left ventricular ejection fraction, by estimation, is 55 to 60%. The  left ventricle has normal function.   8. Right ventricular systolic function is normal. The right ventricular  size is normal.   9. Left atrial size was moderately dilated. No left atrial/left atrial  appendage thrombus was detected.  10. Right atrial size was mildly dilated.  11. The mitral valve is normal in structure. Mild mitral valve  regurgitation.  12. The aortic valve is tricuspid. Aortic valve regurgitation is not  visualized. No aortic stenosis is  present.  13. Evidence of atrial level shunting detected by color flow Doppler.    TELEMETRY reviewed by me (LT) 05/09/2022 : overnight NSR rate 60s, converted back to AF RVR early AM with rates 130s-140s  EKG reviewed by me: NSR 74 nonspecific TW abnormality   Data reviewed by me (LT) 05/09/2022:  hospitalist progress note, nursing notes last 24h vitals tele labs imaging I/O    Principal Problem:   CAP (community acquired pneumonia) Active Problems:   HLD (hyperlipidemia)   Chronic atrial fibrillation with RVR   HTN (hypertension)   Chronic diastolic CHF (congestive heart failure)   Chronic kidney disease, stage 3a   Chronic pain   Asthma   Normocytic anemia   Sepsis    ASSESSMENT AND PLAN:  Molly Mitchell is a 37yoF with a PMH of paroxysmal AF s/p recent Watchman (LAAO closure) placement 4/4, HFpEF, right CEA (09/2021), s/p spinal fusion who presented to [facility] ED 04/26/2022 with cough, shortness of breath, and chest pain for 2 days. Initial concern for CAP, but symptoms ultimately felt to be more related to acute on chronic HFpEF and decompensation from atrial fibrillation with RVR.  Cardiology is consulted for further assistance.  # paroxysmal AF RVR # s/p Watchman LAAO closure device 04/20/2022 Presents with shortness of breath and chest pain x 2 days, in AF RVR with difficult to control HR. Very symptomatic with anxiety. Rate better controlled this AM in the 90s-110s.  - s/p diltiazem infusion with borderline low BP - s/p IV amiodarone bolus and multiple days of IV infusion.  - continue PO amio 200mg  BID for 10 days, then 200mg  daily thereafter. - increase frequency of metoprolol tartrate 12.5mg  from BID to TID - give IV metoprolol 2.5mg  x 1  - continue eliquis 5mg  BID x 45 days following Watchman as per EP  - baseline LFTs: AST/ALT 20/32 respectively  - TSH 1.5, free T4 1.12 - careful dosing of anxiolytics as patient became very sedated on valium  - hesitant to consider DCCV  before repeat imaging to r/o thrombus with recent Watchman placement    # acute hypoxic respiratory failure  # ?CAP # acute on chronic HFpEF  BNP elevated at 700. Clinically somewhat volume up with trace peripheral edema and an oxygen requirement. Initial Cxr with small pleural effusion. Remains  on Abx for CAP.  - s/p 40mg  IV lasix x 1 yesterday, dose another 40mg  today  - careful with medications the prolong the QT interval while on amio   # AKI on CKD 3 Renal function worsening with lasix yesterday PM. Continue to monitor closely.   This patient's plan of care was discussed and created with Dr. Juliann Pares and he is in agreement.  Signed: Rebeca Allegra , PA-C 05/09/2022, 8:12 AM [facility]

## 2022-05-10 DIAGNOSIS — J189 Pneumonia, unspecified organism: Secondary | ICD-10-CM | POA: Diagnosis not present

## 2022-05-10 LAB — CBC
HCT: 30.7 % — ABNORMAL LOW (ref 36.0–46.0)
Hemoglobin: 9.7 g/dL — ABNORMAL LOW (ref 12.0–15.0)
MCH: 29.2 pg (ref 26.0–34.0)
MCHC: 31.6 g/dL (ref 30.0–36.0)
MCV: 92.5 fL (ref 80.0–100.0)
Platelets: 411 10*3/uL — ABNORMAL HIGH (ref 150–400)
RBC: 3.32 MIL/uL — ABNORMAL LOW (ref 3.87–5.11)
RDW: 15.1 % (ref 11.5–15.5)
WBC: 13.3 10*3/uL — ABNORMAL HIGH (ref 4.0–10.5)
nRBC: 0 % (ref 0.0–0.2)

## 2022-05-10 LAB — BASIC METABOLIC PANEL
Anion gap: 11 (ref 5–15)
BUN: 29 mg/dL — ABNORMAL HIGH (ref 8–23)
CO2: 25 mmol/L (ref 22–32)
Calcium: 8.9 mg/dL (ref 8.9–10.3)
Chloride: 101 mmol/L (ref 98–111)
Creatinine, Ser: 1.2 mg/dL — ABNORMAL HIGH (ref 0.44–1.00)
GFR, Estimated: 47 mL/min — ABNORMAL LOW (ref >60)
Glucose, Bld: 91 mg/dL (ref 70–99)
Potassium: 4.2 mmol/L (ref 3.5–5.1)
Sodium: 137 mmol/L (ref 135–145)

## 2022-05-10 LAB — BRAIN NATRIURETIC PEPTIDE: B Natriuretic Peptide: 698.5 pg/mL — ABNORMAL HIGH (ref 0.0–100.0)

## 2022-05-10 MED ORDER — AMIODARONE HCL 200 MG PO TABS
400.0000 mg | ORAL_TABLET | Freq: Two times a day (BID) | ORAL | Status: DC
  Administered 2022-05-10 – 2022-05-14 (×10): 400 mg via ORAL
  Filled 2022-05-10 (×10): qty 2

## 2022-05-10 MED ORDER — FUROSEMIDE 10 MG/ML IJ SOLN
40.0000 mg | Freq: Once | INTRAMUSCULAR | Status: AC
  Administered 2022-05-10: 40 mg via INTRAVENOUS
  Filled 2022-05-10: qty 4

## 2022-05-10 MED ORDER — METOPROLOL TARTRATE 25 MG PO TABS
25.0000 mg | ORAL_TABLET | Freq: Three times a day (TID) | ORAL | Status: DC
  Administered 2022-05-10 – 2022-05-11 (×6): 25 mg via ORAL
  Filled 2022-05-10 (×6): qty 1

## 2022-05-10 MED ORDER — DILTIAZEM HCL 25 MG/5ML IV SOLN
10.0000 mg | INTRAVENOUS | Status: AC
  Administered 2022-05-10: 10 mg via INTRAVENOUS
  Filled 2022-05-10: qty 5

## 2022-05-10 MED ORDER — ENSURE ENLIVE PO LIQD
237.0000 mL | Freq: Two times a day (BID) | ORAL | Status: DC
  Administered 2022-05-10 – 2022-05-14 (×9): 237 mL via ORAL

## 2022-05-10 MED ORDER — AMIODARONE HCL 200 MG PO TABS: 200.0000 mg | ORAL_TABLET | Freq: Every day | ORAL | Status: DC

## 2022-05-10 NOTE — Progress Notes (Signed)
PROGRESS NOTE    GLENDALE WHERRY   ZOX:[id number] DOB: [birth year]  DOA: 04/30/2022 Date of Service: 05/10/22 PCP: [facility]     Brief Narrative / Hospital Course:   ISIS COSTANZA is a 77 y.o. female with medical history significant of A-fib on Eliquis, s/p of Watchman left atrial appendage closure device placement on 04/20/23, hypertension, hyperlipidemia, asthma, diastolic CHF, stroke, CKD-3A, chronic pain, carotid artery stenosis, who presents to the ED from home 04/18/2022 with cough, shortness of breath, chest pain x2 days. Patient recently had Watchman left atrial appendage closure device placement on 04/20/23.  04/17: atrial fibrillation with RVR, heart rate up to 137. Required cardizem drip in ED. Negative PCR for COVID, flu and RSV, WBC 13.7, lactic acid 1.3, procalcitonin 0.19. Urinalysis (cloudy appearance, small amount of leukocyte, negative nitrite, WBC 0-5 with squamous cell contamination 21-50), slightly worsening renal function, BNP 716, trop 6 --> 5. Temperature normal, blood pressure 102/74, RR 25-37, oxygen saturation 97% on room air. Pt is admitted PCU as inpt. Tx for Afib RVR and sepsis d/t CAP.  04/18: significantly anxious, became over sedated w/ valium, low BP, improved in afternoon. Echo reviewed from Watchman device placement - TEE 04/20/22 LVEF 55-60% normal LV and RV fxn, LA mod dilated. Cardiology to adjust rate/rhythm control --> d/c dilt gtt, start po diltiazem, change coreg to metoprolol 12.5 mg po q6h, started amiodarone bolus/gtt. Remained very anxious - alprazolam lower dose administered.  04/19:  converted to sinus rhythm about mid-day. Concern worsening infiltrate on CXR, add azithromycin. 04/20: amio gtt d/c overnight d/t hypotension/bradycardia.CXR personally reviewed - infiltrate worse on L appears more c/w edema/vascular congestion, trial another dose lasix 40 mg IV diuresis. RVR recurred, restarted amiodarone gtt lower dose since had po amio also, BP a  bit low so holding metoprolol po for now. Echo ordered.  04/21: Echo reviewed - EF 60-65%, indeterminate diastolic parameters, enlarged RV, moderately dilated LA and RA, small pericardial effusion w/o tamponade. BP soft. Maintain amio gtt, added midodrine rather than add fluids.  04/22: Overnight HR to 146, given IV metoprolol and converted to sinus but hypotensive, got IV fluids, amio gtt held. Converted to sinus. Somnolent but rousable, reduced/adjusted sedating medications.  04/23: remains in/out Afib RVR. Increased metoprolol frequency. Cardiology hesitant on DCCV - may need repeat imaging to r/o thrombus with recent Watchman placement  Remains dyspneic, likely chf from a-fib, diuresing   Consultants:  Cardiology    Procedures: None            ASSESSMENT & PLAN:   Principal Problem:   CAP (community acquired pneumonia) Active Problems:   Sepsis   Chronic diastolic CHF (congestive heart failure)   Chronic atrial fibrillation with RVR   Asthma   HTN (hypertension)   Chronic pain   HLD (hyperlipidemia)   Chronic kidney disease, stage 3a   Normocytic anemia     Sepsis due to CAP (community acquired pneumonia)  vs SIRS d/t Afib RVR and anxiety  Elevated HR and RR can be either/both etiology Met criteria with WBC 13.6, heart rate up to 137 and RR 25-37.  Lactic acid normal 1.3.  Procalcitonin 0.19.   Slightly worsening renal function on admission with creatinine 1.23, BUN 42, GFR 46 (recent baseline creatinine 1.14 on 03/22/2022).    Urine legionella and S. pneumococcal antigen - neg Treated as below    Community Acquired Pneumonia  S/p abx :IV Rocephin and doxycycline to cover PNA, changed doxy to azithromycin Mucinex for  cough  Bronchodilators CXR prn   Chronic atrial fibrillation with acute RVR Amiodarone gtt per cardiology d/c as of 01:00 05/08/22  PO amio 200mg  BID for 14 days, then 200mg  daily thereafter.  Restarted metoprolol and titrating this  Prn dilt today as  well Eliquis 5 mg po bid --> continue eliquis 5mg  BID x 45 days following Watchman as per EP  Cardiology following, appreciate assistance    Acute hypoxic respiratory failure  Supplemental O2 as needed, currently on 3 liters, likely fluid mediated from a-fib, we are diuresing today. Preserved EF on TTE 4/20   Chronic diastolic CHF (congestive heart failure), w/ exacerbation  2D echo on 04/20/2022 showed EF of 55-60%.  I/os not well documented Lasix 40 iv today, will also f/u bnp   Severe anxiety Sensitivity to benzodiazepine (oversedation w/ Valium)  Scheduled BuSpar   Nausea Compazine given refractory Consider phenergan qhs prn    Asthma Bronchodilators   HTN (hypertension): IV hydralazine as needed Meds as above    Chronic pain Continue as needed Norco, reduced dose  Scheduled reduced dose tizanidine  As needed Tylenol   HLD (hyperlipidemia) Pravastatin   AKI on Chronic kidney disease, stage 3a:  Monitor BMP   Normocytic anemia:  Hemoglobin 10.5 (12.1 on 03/22/2022), no active bleeding. monitor     DVT prophylaxis: Eliquis Pertinent IV fluids/nutrition: no continuous IV fluids at this time  Central lines / invasive devices: none   Code Status: FULL CODE ACP documents reviewed: none on file    Current Admission Status: inpatient, progressive unit d/t RVR difficult to control TOC needs / Dispo plan: anticipate d/c to previous home environment w/ HH, per most recent pt eval Barriers to discharge / significant pending items: remains in rvr and with o2 requirement         Subjective / Brief ROS:  Complains of chronic back pain, ongoing dyspnea, anxiety   Family Communication: husband at bedside on rounds     Objective Findings:  Vitals:   05/10/22 0300 05/10/22 0400 05/10/22 0500 05/10/22 0807  BP: 116/64 136/64 126/76 (!) 142/100  Pulse: (!) 58 72  (!) 141  Resp: 12 20 20  (!) 26  Temp: 97.9 F (36.6 C)   97.8 F (36.6 C)  TempSrc: Oral   Oral   SpO2: 98% 95% 93% 96%  Weight:      Height:        Intake/Output Summary (Last 24 hours) at 05/10/2022 1118 Last data filed at 05/09/2022 1844 Gross per 24 hour  Intake 105 ml  Output --  Net 105 ml     Filed Weights   05/06/22 1000 05/07/22 0324 05/08/22 0500  Weight: 68 kg 70 kg 70.5 kg    Examination:  Physical Exam Constitutional:      Appearance: She is not toxic-appearing.  Cardiovascular:     Rate and Rhythm: Normal rate. Rhythm irregular.     Heart sounds: Murmur heard.     Systolic murmur is present.  Pulmonary:     Effort: No tachypnea, accessory muscle usage or respiratory distress.  Musculoskeletal:     Right lower leg: No edema.     Left lower leg: No edema.  Skin:    General: Skin is warm and dry.  Neurological:     General: No focal deficit present.     Mental Status: She is alert and oriented to person, place, and time.  Psychiatric:        Mood and Affect: Mood is anxious.  Scheduled Medications:   amiodarone  400 mg Oral BID   Followed by   Melene Muller ON 05/18/2022] amiodarone  200 mg Oral Daily   apixaban  5 mg Oral BID   busPIRone  5 mg Oral TID   feeding supplement  237 mL Oral BID AC   metoprolol tartrate  25 mg Oral TID   midodrine  5 mg Oral TID WC   potassium chloride  40 mEq Oral Once   pravastatin  40 mg Oral QHS   tiZANidine  2 mg Oral TID    Continuous Infusions:  promethazine (PHENERGAN) injection (IM or IVPB) 6.25 mg (05/09/22 1821)     PRN Medications:  acetaminophen, albuterol, dextromethorphan-guaiFENesin, HYDROcodone-acetaminophen, ondansetron (ZOFRAN) IV, prochlorperazine, promethazine (PHENERGAN) injection (IM or IVPB)  Antimicrobials from admission:  Anti-infectives (From admission, onward)    Start     Dose/Rate Route Frequency Ordered Stop   05/08/22 1415  azithromycin (ZITHROMAX) tablet 500 mg        500 mg Oral Daily 05/08/22 1324 05/09/22 0809   05/05/22 1600  azithromycin (ZITHROMAX) 500 mg in  sodium chloride 0.9 % 250 mL IVPB  Status:  Discontinued        500 mg 250 mL/hr over 60 Minutes Intravenous Every 24 hours 05/05/22 1515 05/08/22 1324   05/11/2022 1900  doxycycline (VIBRA-TABS) tablet 100 mg  Status:  Discontinued        100 mg Oral Every 12 hours 04/26/2022 1129 05/07/22 1219   04/25/2022 1200  cefTRIAXone (ROCEPHIN) 2 g in sodium chloride 0.9 % 100 mL IVPB        2 g 200 mL/hr over 30 Minutes Intravenous Every 24 hours 05/01/2022 1129 05/09/22 1306   04/26/2022 0600  doxycycline (VIBRAMYCIN) 100 mg in sodium chloride 0.9 % 250 mL IVPB        100 mg 125 mL/hr over 120 Minutes Intravenous  Once 04/22/2022 0459 05/10/2022 0810           Data Reviewed:  I have personally reviewed the following...  CBC: Recent Labs  Lab 05/04/22 0418 05/05/22 0502 05/07/22 0520 05/10/22 0504  WBC 13.6* 19.4* 10.2 13.3*  HGB 10.1* 10.2* 9.7* 9.7*  HCT 31.4* 31.7* 30.7* 30.7*  MCV 91.5 90.6 91.6 92.5  PLT 419* 490* 407* 411*    Basic Metabolic Panel: Recent Labs  Lab 05/04/22 0418 05/05/22 0502 05/06/22 0435 05/07/22 0520 05/08/22 0441 05/09/22 0407 05/10/22 0504  NA 137   < > 136 136 136 138 137  K 4.6   < > 4.1 3.5 3.8 3.9 4.2  CL 104   < > 102 100 101 100 101  CO2 23   < > 22 24 25 27 25   GLUCOSE 145*   < > 110* 105* 117* 124* 91  BUN 35*   < > 45* 40* 36* 30* 29*  CREATININE 1.19*   < > 1.53* 1.21* 1.32* 1.24* 1.20*  CALCIUM 8.7*   < > 8.0* 8.3* 8.4* 8.8* 8.9  MG 2.5*  --   --   --   --  2.0  --    < > = values in this interval not displayed.    GFR: Estimated Creatinine Clearance: 36.7 mL/min (A) (by C-G formula based on SCr of 1.2 mg/dL (H)). Liver Function Tests: Recent Labs  Lab 05/05/22 0502  AST 20  ALT 32  ALKPHOS 248*  BILITOT 0.4  PROT 7.4  ALBUMIN 3.3*    No results for input(s): "LIPASE", "AMYLASE" in  the last 168 hours.  No results for input(s): "AMMONIA" in the last 168 hours. Coagulation Profile: No results for input(s): "INR", "PROTIME" in  the last 168 hours. Cardiac Enzymes: No results for input(s): "CKTOTAL", "CKMB", "CKMBINDEX", "TROPONINI" in the last 168 hours. BNP (last 3 results) No results for input(s): "PROBNP" in the last 8760 hours. HbA1C: No results for input(s): "HGBA1C" in the last 72 hours. CBG: No results for input(s): "GLUCAP" in the last 168 hours. Lipid Profile: No results for input(s): "CHOL", "HDL", "LDLCALC", "TRIG", "CHOLHDL", "LDLDIRECT" in the last 72 hours. Thyroid Function Tests: No results for input(s): "TSH", "T4TOTAL", "FREET4", "T3FREE", "THYROIDAB" in the last 72 hours.  Anemia Panel: No results for input(s): "VITAMINB12", "FOLATE", "FERRITIN", "TIBC", "IRON", "RETICCTPCT" in the last 72 hours. Most Recent Urinalysis On File:     Component Value Date/Time   COLORURINE YELLOW (A) 05/06/2022 0811   APPEARANCEUR CLOUDY (A) 04/27/2022 0811   APPEARANCEUR Clear 02/19/2013 0916   LABSPEC 1.018 04/24/2022 0811   LABSPEC 1.010 02/19/2013 0916   PHURINE 5.0 05/07/2022 0811   GLUCOSEU NEGATIVE 04/24/2022 0811   GLUCOSEU Negative 02/19/2013 0916   HGBUR NEGATIVE 05/08/2022 0811   BILIRUBINUR NEGATIVE 05/13/2022 0811   BILIRUBINUR negative 10/23/2017 1324   BILIRUBINUR Negative 02/19/2013 0916   KETONESUR NEGATIVE 04/18/2022 0811   PROTEINUR 30 (A) 05/14/2022 0811   UROBILINOGEN 0.2 10/23/2017 1324   NITRITE NEGATIVE 04/18/2022 0811   LEUKOCYTESUR SMALL (A) 04/20/2022 0811   LEUKOCYTESUR Negative 02/19/2013 0916   Sepsis Labs: @LABRCNTIP (procalcitonin:4,lacticidven:4) Microbiology: Recent Results (from the past 240 hour(s))  Culture, blood (routine x 2)     Status: None   Collection Time: 04/30/2022  5:37 AM   Specimen: BLOOD RIGHT ARM  Result Value Ref Range Status   Specimen Description BLOOD RIGHT ARM  Final   Special Requests   Final    BOTTLES DRAWN AEROBIC AND ANAEROBIC Blood Culture adequate volume   Culture   Final    NO GROWTH 5 DAYS Performed at [facility], [address] [place], [place] [address]    Report Status 05/08/2022 FINAL  Final  Resp panel by RT-PCR (RSV, Flu A&B, Covid) Anterior Nasal Swab     Status: None   Collection Time: 05/11/2022  5:37 AM   Specimen: Anterior Nasal Swab  Result Value Ref Range Status   SARS Coronavirus 2 by RT PCR NEGATIVE NEGATIVE Final    Comment: (NOTE) SARS-CoV-2 target nucleic acids are NOT DETECTED.  The SARS-CoV-2 RNA is generally detectable in upper respiratory specimens during the acute phase of infection. The lowest concentration of SARS-CoV-2 viral copies this assay can detect is 138 copies/mL. A negative result does not preclude SARS-Cov-2 infection and should not be used as the sole basis for treatment or other patient management decisions. A negative result may occur with  improper specimen collection/handling, submission of specimen other than nasopharyngeal swab, presence of viral mutation(s) within the areas targeted by this assay, and inadequate number of viral copies(<138 copies/mL). A negative result must be combined with clinical observations, patient history, and epidemiological information. The expected result is Negative.  Fact Sheet for Patients:  BloggerCourse.com  Fact Sheet for Healthcare Providers:  SeriousBroker.it  This test is no t yet approved or cleared by the [place] FDA and  has been authorized for detection and/or diagnosis of SARS-CoV-2 by FDA under an Emergency Use Authorization (EUA). This EUA will remain  in effect (meaning this test can be used) for the duration of  the COVID-19 declaration under Section 564(b)(1) of the Act, 21 U.S.C.section 360bbb-3(b)(1), unless the authorization is terminated  or revoked sooner.       Influenza A by PCR NEGATIVE NEGATIVE Final   Influenza B by PCR NEGATIVE NEGATIVE Final    Comment: (NOTE) The Xpert Xpress SARS-CoV-2/FLU/RSV plus assay is intended as an aid in the  diagnosis of influenza from Nasopharyngeal swab specimens and should not be used as a sole basis for treatment. Nasal washings and aspirates are unacceptable for Xpert Xpress SARS-CoV-2/FLU/RSV testing.  Fact Sheet for Patients: BloggerCourse.com  Fact Sheet for Healthcare Providers: SeriousBroker.it  This test is not yet approved or cleared by the [place] FDA and has been authorized for detection and/or diagnosis of SARS-CoV-2 by FDA under an Emergency Use Authorization (EUA). This EUA will remain in effect (meaning this test can be used) for the duration of the COVID-19 declaration under Section 564(b)(1) of the Act, 21 U.S.C. section 360bbb-3(b)(1), unless the authorization is terminated or revoked.     Resp Syncytial Virus by PCR NEGATIVE NEGATIVE Final    Comment: (NOTE) Fact Sheet for Patients: BloggerCourse.com  Fact Sheet for Healthcare Providers: SeriousBroker.it  This test is not yet approved or cleared by the [place] FDA and has been authorized for detection and/or diagnosis of SARS-CoV-2 by FDA under an Emergency Use Authorization (EUA). This EUA will remain in effect (meaning this test can be used) for the duration of the COVID-19 declaration under Section 564(b)(1) of the Act, 21 U.S.C. section 360bbb-3(b)(1), unless the authorization is terminated or revoked.  Performed at [facility], [address] [place], [place] [address]   Culture, blood (routine x 2)     Status: None   Collection Time: 05/04/22  2:54 PM   Specimen: BLOOD  Result Value Ref Range Status   Specimen Description BLOOD BLOOD LEFT HAND  Final   Special Requests   Final    BOTTLES DRAWN AEROBIC AND ANAEROBIC Blood Culture adequate volume   Culture   Final    NO GROWTH 5 DAYS Performed at [facility], [address], [place], [place] [address]    Report  Status 05/09/2022 FINAL  Final      Radiology Studies last 3 days: ECHOCARDIOGRAM COMPLETE  Result Date: 05/06/2022    ECHOCARDIOGRAM REPORT   Patient Name:   VENESA SEMIDEY Date of Exam: 05/06/2022 Medical Rec #:  [id number]      Height:       62.0 in Accession #:    [id number]     Weight:       149.9 lb Date of Birth:  1945-07-25       BSA:          1.691 m Patient Age:    77 years       BP:           95/65 mmHg Patient Gender: F              HR:           107 bpm. Exam Location:  [facility] Procedure: 2D Echo Indications:     Atrial Fibrillation I48.91  History:         Patient has prior history of Echocardiogram examinations, most                  recent 04/20/2022.  Sonographer:     Overton Mam RDCS, FASE Referring Phys:  Lajuana Carry A KHAN Diagnosing Phys: Adrian Blackwater  Sonographer Comments: Technically difficult study due to poor echo windows. Image acquisition challenging due to respiratory motion. The patient had coughing spells throughout this study. IMPRESSIONS  1. Left ventricular ejection fraction, by estimation, is 60 to 65%. The left ventricle has normal function. The left ventricle has no regional wall motion abnormalities. There is mild concentric left ventricular hypertrophy. Left ventricular diastolic parameters are indeterminate.  2. Right ventricular systolic function is normal. The right ventricular size is moderately enlarged.  3. Left atrial size was moderately dilated.  4. Right atrial size was moderately dilated.  5. A small pericardial effusion is present. The pericardial effusion is circumferential. There is no evidence of cardiac tamponade.  6. The mitral valve is normal in structure. Mild mitral valve regurgitation. No evidence of mitral stenosis.  7. The aortic valve is normal in structure. Aortic valve regurgitation is not visualized. No aortic stenosis is present.  8. The inferior vena cava is normal in size with greater than 50% respiratory variability, suggesting right atrial  pressure of 3 mmHg. FINDINGS  Left Ventricle: Left ventricular ejection fraction, by estimation, is 60 to 65%. The left ventricle has normal function. The left ventricle has no regional wall motion abnormalities. The left ventricular internal cavity size was normal in size. There is  mild concentric left ventricular hypertrophy. Left ventricular diastolic parameters are indeterminate. Right Ventricle: The right ventricular size is moderately enlarged. No increase in right ventricular wall thickness. Right ventricular systolic function is normal. Left Atrium: Left atrial size was moderately dilated. Right Atrium: Right atrial size was moderately dilated. Pericardium: A small pericardial effusion is present. The pericardial effusion is circumferential. There is no evidence of cardiac tamponade. Mitral Valve: The mitral valve is normal in structure. Mild mitral valve regurgitation. No evidence of mitral valve stenosis. Tricuspid Valve: The tricuspid valve is normal in structure. Tricuspid valve regurgitation is mild . No evidence of tricuspid stenosis. Aortic Valve: The aortic valve is normal in structure. Aortic valve regurgitation is not visualized. No aortic stenosis is present. Aortic valve peak gradient measures 10.9 mmHg. Pulmonic Valve: The pulmonic valve was normal in structure. Pulmonic valve regurgitation is not visualized. No evidence of pulmonic stenosis. Aorta: The aortic root is normal in size and structure. Venous: The inferior vena cava is normal in size with greater than 50% respiratory variability, suggesting right atrial pressure of 3 mmHg. IAS/Shunts: No atrial level shunt detected by color flow Doppler.  LEFT VENTRICLE PLAX 2D LVIDd:         3.80 cm   Diastology LVIDs:         2.70 cm   LV e' medial:    13.40 cm/s LV PW:         1.00 cm   LV E/e' medial:  6.7 LV IVS:        1.00 cm   LV e' lateral:   8.27 cm/s LVOT diam:     1.80 cm   LV E/e' lateral: 10.8 LV SV:         58 LV SV Index:   34 LVOT  Area:     2.54 cm  RIGHT VENTRICLE RV Basal diam:  2.80 cm RV S prime:     12.10 cm/s TAPSE (M-mode): 1.0 cm LEFT ATRIUM           Index        RIGHT ATRIUM           Index LA diam:      3.60 cm  2.13 cm/m   RA Area:     16.20 cm LA Vol (A4C): 30.2 ml 17.86 ml/m  RA Volume:   45.30 ml  26.78 ml/m  AORTIC VALVE                 PULMONIC VALVE AV Area (Vmax): 1.77 cm     PV Vmax:        1.04 m/s AV Vmax:        165.00 cm/s  PV Peak grad:   4.3 mmHg AV Peak Grad:   10.9 mmHg    RVOT Peak grad: 3 mmHg LVOT Vmax:      115.00 cm/s LVOT Vmean:     75.800 cm/s LVOT VTI:       0.226 m  AORTA Ao Root diam: 3.00 cm Ao Asc diam:  3.20 cm MITRAL VALVE               TRICUSPID VALVE MV Area (PHT): 2.94 cm    TR Peak grad:   22.3 mmHg MV Decel Time: 258 msec    TR Vmax:        236.00 cm/s MV E velocity: 89.60 cm/s                            SHUNTS                            Systemic VTI:  0.23 m                            Systemic Diam: 1.80 cm Adrian Blackwater Electronically signed by Adrian Blackwater Signature Date/Time: 05/06/2022/11:04:36 PM    Final              LOS: 7 days       Silvano Bilis, MD [facility] 05/10/2022, 11:18 AM    Staff may message me via secure chat in Epic  but this may not receive an immediate response,  please page me for urgent matters!  If 7PM-7AM, please contact night coverage [email or web address]

## 2022-05-10 NOTE — Progress Notes (Signed)
PT Cancellation Note  Patient Details Name: Molly Mitchell MRN: [id number] DOB: 21-Dec-1945   Cancelled Treatment:    Reason Eval/Treat Not Completed: Medical issues which prohibited therapy Spoke with nursing this AM, reports she has been too tachy despite multiple (oral, IV, etc) attempts to control rate.  Asks to hold PT activity at this time, will maintain on caseload and attempt to treat when appropriate.   Malachi Pro 05/10/2022, 1:58 PM

## 2022-05-10 NOTE — Progress Notes (Signed)
Spoke with patient regarding therapy recommendations. Patient is agreeable to [facility] and does not have a preference of an agency.  She does not have a RW.   She has been advised a referral will be sent and the [facility] agency will reach out to her to schedule SOC.  Request for RW sent to [employer].  Referral for [facility] PT/OT sent to Sarah from [place].

## 2022-05-10 NOTE — Progress Notes (Signed)
[facility] CLINIC CARDIOLOGY CONSULT NOTE       Patient ID: Molly Mitchell MRN: [id number] DOB/AGE: Feb 26, 1945 77 y.o.  Admit date: 05/02/2022 Referring Physician Dr. Sunnie Nielsen  Primary Physician Dr. Nemiah Commander Primary Cardiologist Minda Ditto, PA-C  Reason for Consultation AF RVR  HPI: Molly Mitchell is a 77yoF with a PMH of paroxysmal AF s/p recent Watchman (LAAO closure) placement 4/4, HFpEF, right CEA (09/2021), s/p spinal fusion who presented to [facility] ED 05/05/2022 with cough, shortness of breath, and chest pain for 2 days. Initial concern for CAP, but symptoms ultimately felt to be more related to acute on chronic HFpEF and decompensation from atrial fibrillation with RVR.  Cardiology is consulted for further assistance.  Interval History:  - in NSR with rates in the 60s overnight, converts to AF RVR in the AM with rates uncontrolled in the 140s-160s, better in the 90s-110s after IV dilt 10mg  x 1 and increased doses of metoprolol and amiodarone - feels short of breath and scared, no chest pain, palpitations, or LE edema - remains with supplemental O2 requirement    Past Medical History:  Diagnosis Date   Acute right MCA stroke 08/01/2021   a.) noted on MRI head 08/01/2021 --> punctate acute RIGHT MCA territory infarcts affecting the frontal and parietal contex consistent with microembolic infarcts in the RIGHT carotid circulation   Anemia    Arthritis    Bilateral carotid artery disease 08/18/2021   a.) CTA neck 08/18/2021: 70-80% RICA and 40% LICA   Cervicalgia    Chronic kidney disease    Chronic pain syndrome    Chronic, continuous use of opioids    a.) hydrocodone/APAP (Norco 10/325 mg)   Connective tissue disease    pt reports being diagnosed with undifferentiated connective tissue disease at some point- pt took steroids for this and has had no issues since- 10 years ago   Coronary artery disease    Cortical senile cataract    COVID 2021   DDD (degenerative disc  disease), cervical    a.) s/p C4-C6 ACDF   DDD (degenerative disc disease), lumbar    a.) s/p laminectomy + posterior L2-S1 fusion   Dysrhythmia    A-fib   History of 2019 novel coronavirus disease (COVID-19) 12/07/2018   History of blood transfusion    History of heart murmur in childhood    History of rheumatic fever    HLD (hyperlipidemia)    Hypertension    Lower extremity weakness    a.) with position changes from sitting to standing   Osteoporosis    Presence of Watchman left atrial appendage closure device 04/20/2022   27mm Watchman FLX placed by Dr. Lalla Brothers   Pseudogout    Shingles    Skin cancer of face    Spondylolisthesis of lumbar region     Past Surgical History:  Procedure Laterality Date   ABDOMINAL HYSTERECTOMY  1981   AIKEN OSTEOTOMY Right 01/30/2018   Procedure: WEIL RIGHT 2ND AND 3RD;  Surgeon: Gwyneth Revels, DPM;  Location: [facility];  Service: Podiatry;  Laterality: Right;   ANTERIOR CERVICAL DECOMP/DISCECTOMY FUSION N/A 2003   C4-C6   BACK SURGERY     pt reports hx of 3 lumber back surgeries total   CERVICAL SPINE SURGERY     pt reports having 3 cervical fusions total   ENDARTERECTOMY Right 09/21/2021   Procedure: ENDARTERECTOMY CAROTID;  Surgeon: Renford Dills, MD;  Location: [facility] ORS;  Service: Vascular;  Laterality: Right;  Right  carotid   HAMMER TOE SURGERY Right 01/30/2018   Procedure: HAMMER TOE CORRECTION SECOND AND THIRD;  Surgeon: Gwyneth Revels, DPM;  Location: [facility];  Service: Podiatry;  Laterality: Right;  GENERAL WITH LOCAL   HAMMER TOE SURGERY Left 03/05/2019   Procedure: HAMMER TOE CORRECTION T1 AND T2;  Surgeon: Gwyneth Revels, DPM;  Location: [facility];  Service: Podiatry;  Laterality: Left;   LEFT ATRIAL APPENDAGE OCCLUSION N/A 04/20/2022   Procedure: LEFT ATRIAL APPENDAGE OCCLUSION;  Surgeon: Lanier Prude, MD;  Location: [facility] IN[facility]AS[facility] CV [facility];  Service: Cardiovascular;  Laterality: N/A;    POSTERIOR LUMBAR FUSION N/A 2001   L2-S1   ROTATOR CUFF REPAIR Right 1981   SACROPLASTY N/A 03/29/2017   S1   TEE WITHOUT CARDIOVERSION N/A 04/20/2022   Procedure: TRANSESOPHAGEAL ECHOCARDIOGRAM;  Surgeon: Lanier Prude, MD;  [facility];  Service: Cardiovascular;  Laterality: N/A;   TOTAL KNEE ARTHROPLASTY Left 03/03/2013   TOTAL KNEE REVISION Left 07/31/2016   Procedure: TOTAL KNEE REVISION;  Surgeon: Donato Heinz, MD;  Location: [facility];  Service: Orthopedics;  Laterality: Left;   WEIL OSTEOTOMY Left 03/05/2019   Procedure: WEIL OSTEOTOMY X 2 LEFT;  Surgeon: Gwyneth Revels, DPM;  Location: [facility];  Service: Podiatry;  Laterality: Left;    Medications Prior to Admission  Medication Sig Dispense Refill Last Dose   amLODipine (NORVASC) 2.5 MG tablet Take 2.5-5 mg by mouth See admin instructions. Take 2.5 mg in the morning and 5 mg at bedtime   Past Week   amoxicillin (AMOXIL) 500 MG capsule Take 4 tablets by mouth 1 hour prior to dental procedures. 12 capsule 3 unknown   apixaban (ELIQUIS) 5 MG TABS tablet Take 1 tablet (5 mg total) by mouth 2 (two) times daily. 60 tablet  05/02/2022   carvedilol (COREG) 3.125 MG tablet Take 3.125 mg by mouth 2 (two) times daily with a meal.   Past Week   HYDROcodone-acetaminophen (NORCO/VICODIN) 5-325 MG tablet Take 2 tablets by mouth 4 (four) times daily.   Past Week   pravastatin (PRAVACHOL) 40 MG tablet Take 40 mg by mouth at bedtime.   05/02/2022   telmisartan (MICARDIS) 80 MG tablet Take 80 mg by mouth daily.   Past Week   tiZANidine (ZANAFLEX) 4 MG tablet Take 1 tablet (4 mg total) by mouth 2 (two) times daily.   05/02/2022    Social History   Socioeconomic History   Marital status: Married    Spouse name: [name]   Number of children: 2   Years of education: Not on file   Highest education level: High school graduate  Occupational History   Occupation: retired   Occupation: Retired Engineer, manufacturing systems  Sto[employer]  Tobacco Use   Smoking status: Former    Packs/day: 1.00    Years: 15.00    Additional pack years: 0.00    Total pack years: 15.00    Types: Cigarettes    Quit date: 04/17/1979    Years since quitting: 43.0   Smokeless tobacco: Never  Vaping Use   Vaping Use: Never used  Substance and Sexual Activity   Alcohol use: No    Alcohol/week: 0.0 standard drinks of alcohol   Drug use: No   Sexual activity: Not on file  Other Topics Concern   Not on file  Social History Narrative   Not on file   Social Determinants of Health   Financial Resource Strain: Low Risk  (03/23/2020)  Overall Financial Resource Strain (CARDIA)    Difficulty of Paying Living Expenses: Not hard at all  Food Insecurity: No Food Insecurity (05/04/2022)   Hunger Vital Sign    Worried About Running Out of Food in the Last Year: Never true    Ran Out of Food in the Last Year: Never true  Transportation Needs: No Transportation Needs (05/04/2022)   PRAPARE - Administrator, Civil Service (Medical): No    Lack of Transportation (Non-Medical): No  Physical Activity: Sufficiently Active (03/23/2020)   Exercise Vital Sign    Days of Exercise per Week: 3 days    Minutes of Exercise per Session: 60 min  Stress: No Stress Concern Present (03/23/2020)   [employer] of Occupational Health - Occupational Stress Questionnaire    Feeling of Stress : Not at all  Social Connections: Socially Integrated (03/23/2020)   Social Connection and Isolation Panel [NHANES]    Frequency of Communication with Friends and Family: Never    Frequency of Social Gatherings with Friends and Family: More than three times a week    Attends Religious Services: More than 4 times per year    Active Member of Clubs or Organizations: Yes    Attends Banker Meetings: More than 4 times per year    Marital Status: Married  Catering manager Violence: Not At Risk (05/04/2022)   Humiliation, Afraid, Rape, and Kick  questionnaire    Fear of Current or Ex-Partner: No    Emotionally Abused: No    Physically Abused: No    Sexually Abused: No    Family History  Problem Relation Age of Onset   Hypertension Brother    Hypertension Mother    Hypertension Sister    Cancer Sister    Diabetes Paternal Grandmother    Heart failure Maternal Grandmother       Intake/Output Summary (Last 24 hours) at 05/10/2022 1109 Last data filed at 05/09/2022 1844 Gross per 24 hour  Intake 105 ml  Output --  Net 105 ml     Vitals:   05/10/22 0300 05/10/22 0400 05/10/22 0500 05/10/22 0807  BP: 116/64 136/64 126/76 (!) 142/100  Pulse: (!) 58 72  (!) 141  Resp: 12 20 20  (!) 26  Temp: 97.9 F (36.6 C)   97.8 F (36.6 C)  TempSrc: Oral   Oral  SpO2: 98% 95% 93% 96%  Weight:      Height:        PHYSICAL EXAM General: elderly caucasian female, sitting upright in bed, husband at bedside  HEENT:  Normocephalic and atraumatic. Neck:  No JVD.  Lungs: Normal respiratory effort on O2 by Bowers. Crackles in left base Heart: tachy irregular irregular . Normal S1 and S2 without gallops or murmurs.  Abdomen: Non-distended appearing.  Msk: Normal strength and tone for age. Extremities: Warm and well perfused. No clubbing, cyanosis. Trace bilateral LE edema.  Neuro: alert and oriented  Psych:  anxious mood, tearful  Labs: Basic Metabolic Panel: Recent Labs    05/09/22 0407 05/10/22 0504  NA 138 137  K 3.9 4.2  CL 100 101  CO2 27 25  GLUCOSE 124* 91  BUN 30* 29*  CREATININE 1.24* 1.20*  CALCIUM 8.8* 8.9  MG 2.0  --     Liver Function Tests: No results for input(s): "AST", "ALT", "ALKPHOS", "BILITOT", "PROT", "ALBUMIN" in the last 72 hours.  No results for input(s): "LIPASE", "AMYLASE" in the last 72 hours.  CBC: Recent Labs  05/10/22 0504  WBC 13.3*  HGB 9.7*  HCT 30.7*  MCV 92.5  PLT 411*    Cardiac Enzymes: No results for input(s): "CKTOTAL", "CKMB", "CKMBINDEX", "TROPONINIHS" in the last 72  hours.  BNP: No results for input(s): "BNP" in the last 72 hours.  D-Dimer: No results for input(s): "DDIMER" in the last 72 hours. Hemoglobin A1C: No results for input(s): "HGBA1C" in the last 72 hours. Fasting Lipid Panel: No results for input(s): "CHOL", "HDL", "LDLCALC", "TRIG", "CHOLHDL", "LDLDIRECT" in the last 72 hours. Thyroid Function Tests: No results for input(s): "TSH", "T4TOTAL", "T3FREE", "THYROIDAB" in the last 72 hours.  Invalid input(s): "FREET3"  Anemia Panel: No results for input(s): "VITAMINB12", "FOLATE", "FERRITIN", "TIBC", "IRON", "RETICCTPCT" in the last 72 hours.   Radiology: ECHOCARDIOGRAM COMPLETE  Result Date: 05/06/2022    ECHOCARDIOGRAM REPORT   Patient Name:   Molly Mitchell Date of Exam: 05/06/2022 Medical Rec #:  [id number]      Height:       62.0 in Accession #:    [id number]     Weight:       149.9 lb Date of Birth:  [birth year]       BSA:          1.691 m Patient Age:    77 years       BP:           95/65 mmHg Patient Gender: F              HR:           107 bpm. Exam Location:  [facility] Procedure: 2D Echo Indications:     Atrial Fibrillation I48.91  History:         Patient has prior history of Echocardiogram examinations, most                  recent 04/20/2022.  Sonographer:     Overton Mam RDCS, FASE Referring Phys:  Lajuana Carry A KHAN Diagnosing Phys: Adrian Blackwater  Sonographer Comments: Technically difficult study due to poor echo windows. Image acquisition challenging due to respiratory motion. The patient had coughing spells throughout this study. IMPRESSIONS  1. Left ventricular ejection fraction, by estimation, is 60 to 65%. The left ventricle has normal function. The left ventricle has no regional wall motion abnormalities. There is mild concentric left ventricular hypertrophy. Left ventricular diastolic parameters are indeterminate.  2. Right ventricular systolic function is normal. The right ventricular size is moderately enlarged.  3. Left atrial size  was moderately dilated.  4. Right atrial size was moderately dilated.  5. A small pericardial effusion is present. The pericardial effusion is circumferential. There is no evidence of cardiac tamponade.  6. The mitral valve is normal in structure. Mild mitral valve regurgitation. No evidence of mitral stenosis.  7. The aortic valve is normal in structure. Aortic valve regurgitation is not visualized. No aortic stenosis is present.  8. The inferior vena cava is normal in size with greater than 50% respiratory variability, suggesting right atrial pressure of 3 mmHg. FINDINGS  Left Ventricle: Left ventricular ejection fraction, by estimation, is 60 to 65%. The left ventricle has normal function. The left ventricle has no regional wall motion abnormalities. The left ventricular internal cavity size was normal in size. There is  mild concentric left ventricular hypertrophy. Left ventricular diastolic parameters are indeterminate. Right Ventricle: The right ventricular size is moderately enlarged. No increase in right ventricular wall thickness. Right ventricular systolic function is normal. Left  Atrium: Left atrial size was moderately dilated. Right Atrium: Right atrial size was moderately dilated. Pericardium: A small pericardial effusion is present. The pericardial effusion is circumferential. There is no evidence of cardiac tamponade. Mitral Valve: The mitral valve is normal in structure. Mild mitral valve regurgitation. No evidence of mitral valve stenosis. Tricuspid Valve: The tricuspid valve is normal in structure. Tricuspid valve regurgitation is mild . No evidence of tricuspid stenosis. Aortic Valve: The aortic valve is normal in structure. Aortic valve regurgitation is not visualized. No aortic stenosis is present. Aortic valve peak gradient measures 10.9 mmHg. Pulmonic Valve: The pulmonic valve was normal in structure. Pulmonic valve regurgitation is not visualized. No evidence of pulmonic stenosis. Aorta: The  aortic root is normal in size and structure. Venous: The inferior vena cava is normal in size with greater than 50% respiratory variability, suggesting right atrial pressure of 3 mmHg. IAS/Shunts: No atrial level shunt detected by color flow Doppler.  LEFT VENTRICLE PLAX 2D LVIDd:         3.80 cm   Diastology LVIDs:         2.70 cm   LV e' medial:    13.40 cm/s LV PW:         1.00 cm   LV E/e' medial:  6.7 LV IVS:        1.00 cm   LV e' lateral:   8.27 cm/s LVOT diam:     1.80 cm   LV E/e' lateral: 10.8 LV SV:         58 LV SV Index:   34 LVOT Area:     2.54 cm  RIGHT VENTRICLE RV Basal diam:  2.80 cm RV S prime:     12.10 cm/s TAPSE (M-mode): 1.0 cm LEFT ATRIUM           Index        RIGHT ATRIUM           Index LA diam:      3.60 cm 2.13 cm/m   RA Area:     16.20 cm LA Vol (A4C): 30.2 ml 17.86 ml/m  RA Volume:   45.30 ml  26.78 ml/m  AORTIC VALVE                 PULMONIC VALVE AV Area (Vmax): 1.77 cm     PV Vmax:        1.04 m/s AV Vmax:        165.00 cm/s  PV Peak grad:   4.3 mmHg AV Peak Grad:   10.9 mmHg    RVOT Peak grad: 3 mmHg LVOT Vmax:      115.00 cm/s LVOT Vmean:     75.800 cm/s LVOT VTI:       0.226 m  AORTA Ao Root diam: 3.00 cm Ao Asc diam:  3.20 cm MITRAL VALVE               TRICUSPID VALVE MV Area (PHT): 2.94 cm    TR Peak grad:   22.3 mmHg MV Decel Time: 258 msec    TR Vmax:        236.00 cm/s MV E velocity: 89.60 cm/s                            SHUNTS  Systemic VTI:  0.23 m                            Systemic Diam: 1.80 cm Adrian Blackwater Electronically signed by Adrian Blackwater Signature Date/Time: 05/06/2022/11:04:36 PM    Final    DG Chest Port 1 View  Result Date: 05/06/2022 CLINICAL DATA:  Shortness of breath EXAM: PORTABLE CHEST 1 VIEW COMPARISON:  05/05/2018 FINDINGS: Cardiac shadow is enlarged but stable. Watchman device is again seen. Postsurgical changes in the thoracolumbar spine are noted. Slight increased central vascular congestion is noted with mild  interstitial edema. Small effusions are noted bilaterally. IMPRESSION: Slight increase in vascular congestion with mild interstitial edema. Small effusions are again noted. Electronically Signed   By: Alcide Clever M.D.   On: 05/06/2022 09:29   DG Chest Port 1 View  Result Date: 05/05/2022 CLINICAL DATA:  Shortness of breath. EXAM: PORTABLE CHEST 1 VIEW COMPARISON:  04/27/2022. FINDINGS: Increasing bibasilar opacities, left-greater-than-right, suspicious for aspiration or multifocal pneumonia. Unchanged small left pleural effusion. Stable cardiac and mediastinal contours. No pneumothorax. IMPRESSION: Increasing bibasilar opacities, greater on the left, suspicious for aspiration or multifocal pneumonia. Electronically Signed   By: Orvan Falconer M.D.   On: 05/05/2022 13:20   DG Chest Port 1 View  Result Date: 04/25/2022 CLINICAL DATA:  Chest pain EXAM: PORTABLE CHEST 1 VIEW COMPARISON:  04/20/2022 FINDINGS: Mild bilateral lower lobe opacities, left greater than right, atelectasis versus pneumonia. Small left pleural effusion, new. No frank interstitial edema. Cardiomegaly. Thoracolumbar spine fixation hardware. Cervical spine fixation hardware, incompletely visualized. IMPRESSION: Mild bilateral lower lobe opacities, left greater than right, atelectasis versus pneumonia. Small left pleural effusion. Electronically Signed   By: Charline Bills M.D.   On: 05/07/2022 03:37   DG Chest 2 View  Result Date: 04/22/2022 CLINICAL DATA:  77 year old female, preoperative evaluation. EXAM: CHEST - 2 VIEW COMPARISON:  10/13/2013, 01/30/2022 FINDINGS: The mediastinal contours are within normal limits. No cardiomegaly. The lungs are clear bilaterally without evidence of focal consolidation, pleural effusion, or pneumothorax. Multilevel, incompletely visualized thoracolumbar fusion hardware in place. Lower cervical ACDF hardware in place. No acute osseous abnormality. IMPRESSION: No acute cardiopulmonary process.  Electronically Signed   By: Marliss Coots M.D.   On: 04/22/2022 11:34   EP STUDY  Result Date: 04/20/2022 CONCLUSIONS: 1.Successful implantation of a WATCHMAN left atrial appendage occlusive device   2. TEE demonstrating no LAA thrombus 3. No early apparent complications. Post Implant Anticoagulation Strategy: Continue Eliquis 5mg  PO BID x 45 days after implant. After 45 days, stop Eliquis and start Plavix 75mg  PO daily to complete 6 months of post implant therapy. Plan for CT scan 60 days after implant to assess Watchman position.   ECHO TEE  Result Date: 04/20/2022    TRANSESOPHOGEAL ECHO REPORT   Patient Name:   Molly Mitchell Date of Exam: 04/20/2022 Medical Rec #:  [id number]      Height:       62.0 in Accession #:    [id number]     Weight:       145.0 lb Date of Birth:  1945/07/31       BSA:          1.667 m Patient Age:    77 years       BP:           158/56 mmHg Patient Gender: F  HR:           76 bpm. Exam Location:  Inpatient Procedure: Transesophageal Echo, 3D Echo, Color Doppler and Cardiac Doppler Indications:     I48.2 Chronic atrial fibrillation  History:         Patient has prior history of Echocardiogram examinations, most                  recent 09/20/2021. Arrythmias:Atrial Fibrillation; Risk                  Factors:Hypertension and Dyslipidemia.  Sonographer:     Irving Burton Senior RDCS Referring Phys:  [id number] Lanier Prude Diagnosing Phys: Riley Lam MD  Sonographer Comments: 27mm Watchman FLX Implanted PROCEDURE: After discussion of the risks and benefits of a TEE, an informed consent was obtained from the patient. The transesophogeal probe was passed without difficulty through the esophogus of the patient. Sedation performed by different physician. The patient was monitored while under deep sedation. The patient developed no complications during the procedure.  IMPRESSIONS  1. Prior to procedure, patent left atrial appendage. Maximal diameter 2.19 cm (ostial  measurements). Sufficient sizing for a 27 mm Watchman FLX device.  2. A mid-posterior transeptal puncture was performed.  3. a 27 mm Watchman FLX was deployed. Greater than 1/2 shoulder seen. Small tissue compression seen at the anterior mitral valve annulus. No thrombus seen. Device recaptured.  4. At redeployment a 27 mm Watchman FLX was present. No peri-device leak. No thrombus. A 1/3-1/2 mitral shoulder was noted in the 135 view. Average compression ~ 15%.  5. No significant pericardial effusion seen throughout study.  6. Iatrogenic ASD present. Majority of flow was left to right, rare right to left shunting.  7. Left ventricular ejection fraction, by estimation, is 55 to 60%. The left ventricle has normal function.  8. Right ventricular systolic function is normal. The right ventricular size is normal.  9. Left atrial size was moderately dilated. No left atrial/left atrial appendage thrombus was detected. 10. Right atrial size was mildly dilated. 11. The mitral valve is normal in structure. Mild mitral valve regurgitation. 12. The aortic valve is tricuspid. Aortic valve regurgitation is not visualized. No aortic stenosis is present. 13. Evidence of atrial level shunting detected by color flow Doppler. FINDINGS  Left Ventricle: Left ventricular ejection fraction, by estimation, is 55 to 60%. The left ventricle has normal function. The left ventricular internal cavity size was normal in size. Right Ventricle: The right ventricular size is normal. Right vetricular wall thickness was not well visualized. Right ventricular systolic function is normal. Left Atrium: Left atrial size was moderately dilated. No left atrial/left atrial appendage thrombus was detected. Right Atrium: Right atrial size was mildly dilated. Pericardium: There is no evidence of pericardial effusion. Mitral Valve: The mitral valve is normal in structure. Mild mitral valve regurgitation. Tricuspid Valve: The tricuspid valve is normal in  structure. Tricuspid valve regurgitation is trivial. No evidence of tricuspid stenosis. Aortic Valve: The aortic valve is tricuspid. Aortic valve regurgitation is not visualized. No aortic stenosis is present. Pulmonic Valve: The pulmonic valve was normal in structure. Pulmonic valve regurgitation is not visualized. No evidence of pulmonic stenosis. Aorta: The aortic root, ascending aorta, aortic arch and descending aorta are all structurally normal, with no evidence of dilitation or obstruction. IAS/Shunts: Evidence of atrial level shunting detected by color flow Doppler. Additional Comments: Spectral Doppler performed. Riley Lam MD Electronically signed by Riley Lam MD Signature Date/Time: 04/20/2022/10:50:03 AM  Final     ECHO - TEE A04/04/2022  1. Prior to procedure, patent left atrial appendage. Maximal diameter  2.19 cm (ostial measurements). Sufficient sizing for a 27 mm Watchman FLX  device.   2. A mid-posterior transeptal puncture was performed.   3. a 27 mm Watchman FLX was deployed. Greater than 1/2 shoulder seen.  Small tissue compression seen at the anterior mitral valve annulus. No  thrombus seen. Device recaptured.   4. At redeployment a 27 mm Watchman FLX was present. No peri-device leak.  No thrombus. A 1/3-1/2 mitral shoulder was noted in the 135 view. Average  compression ~ 15%.   5. No significant pericardial effusion seen throughout study.   6. Iatrogenic ASD present. Majority of flow was left to right, rare right  to left shunting.   7. Left ventricular ejection fraction, by estimation, is 55 to 60%. The  left ventricle has normal function.   8. Right ventricular systolic function is normal. The right ventricular  size is normal.   9. Left atrial size was moderately dilated. No left atrial/left atrial  appendage thrombus was detected.  10. Right atrial size was mildly dilated.  11. The mitral valve is normal in structure. Mild mitral valve   regurgitation.  12. The aortic valve is tricuspid. Aortic valve regurgitation is not  visualized. No aortic stenosis is present.  13. Evidence of atrial level shunting detected by color flow Doppler.    TELEMETRY reviewed by me (LT) 05/10/2022 : overnight NSR rate 60s, converted back to AF RVR early AM with rates 140s-160s  EKG reviewed by me: NSR 74 nonspecific TW abnormality   Data reviewed by me (LT) 05/10/2022:  hospitalist progress note, nursing notes last 24h vitals tele labs imaging I/O    Principal Problem:   CAP (community acquired pneumonia) Active Problems:   HLD (hyperlipidemia)   Chronic atrial fibrillation with RVR   HTN (hypertension)   Chronic diastolic CHF (congestive heart failure)   Chronic kidney disease, stage 3a   Chronic pain   Asthma   Normocytic anemia   Sepsis    ASSESSMENT AND PLAN:  Molly Mitchell is a 62yoF with a PMH of paroxysmal AF s/p recent Watchman (LAAO closure) placement 4/4, HFpEF, right CEA (09/2021), s/p spinal fusion who presented to [facility] ED 05/13/2022 with cough, shortness of breath, and chest pain for 2 days. Initial concern for CAP, but symptoms ultimately felt to be more related to acute on chronic HFpEF and decompensation from atrial fibrillation with RVR.  Cardiology is consulted for further assistance.  # paroxysmal AF RVR # s/p Watchman LAAO closure device 04/20/2022 Presents with shortness of breath and chest pain x 2 days, in AF RVR with difficult to control HR. Very symptomatic with anxiety.  - s/p diltiazem infusion with borderline low BP - s/p IV amiodarone bolus and multiple days of IV infusion.  - increase PO amio to 400mg  BID for 8 more days, then 200mg  daily thereafter. - increase dose of metoprolol tartrate to 25mg  TID - give IV diltiazem 10mg  x 1 - continue eliquis 5mg  BID x 45 days following Watchman as per EP  - baseline LFTs: AST/ALT 20/32 respectively  - TSH 1.5, free T4 1.12 - careful dosing of anxiolytics as  patient became very sedated on valium - hesitant to consider DCCV before repeat imaging to r/o thrombus with recent Watchman placement, and currently not indicated since her AF is paroxysmal (converts to NSR every evening)   # acute hypoxic  respiratory failure  # ?CAP # acute on chronic HFpEF  BNP elevated at 700. Clinically somewhat volume up with trace peripheral edema and an oxygen requirement. Initial Cxr with small pleural effusion. Remains on Abx for CAP.  - give 40mg  IV lasix x 1 today  - careful with medications the prolong the QT interval while on amio   # AKI on CKD 3 Renal function stable. Continue to monitor closely.   This patient's plan of care was discussed and created with Dr. Juliann Pares and he is in agreement.  Signed: Rebeca Allegra , PA-C 05/10/2022, 11:09 AM [facility]

## 2022-05-11 ENCOUNTER — Inpatient Hospital Stay: Payer: Medicare HMO

## 2022-05-11 DIAGNOSIS — I482 Chronic atrial fibrillation, unspecified: Secondary | ICD-10-CM | POA: Diagnosis not present

## 2022-05-11 LAB — BASIC METABOLIC PANEL
Anion gap: 10 (ref 5–15)
BUN: 35 mg/dL — ABNORMAL HIGH (ref 8–23)
CO2: 30 mmol/L (ref 22–32)
Calcium: 9.4 mg/dL (ref 8.9–10.3)
Chloride: 101 mmol/L (ref 98–111)
Creatinine, Ser: 1.11 mg/dL — ABNORMAL HIGH (ref 0.44–1.00)
GFR, Estimated: 52 mL/min — ABNORMAL LOW (ref >60)
Glucose, Bld: 125 mg/dL — ABNORMAL HIGH (ref 70–99)
Potassium: 3.9 mmol/L (ref 3.5–5.1)
Sodium: 141 mmol/L (ref 135–145)

## 2022-05-11 LAB — MAGNESIUM: Magnesium: 1.8 mg/dL (ref 1.7–2.4)

## 2022-05-11 MED ORDER — LORAZEPAM 1 MG PO TABS: 1.0000 mg | ORAL_TABLET | Freq: Once | ORAL | Status: DC

## 2022-05-11 MED ORDER — HYDROXYZINE HCL 25 MG PO TABS
25.0000 mg | ORAL_TABLET | Freq: Three times a day (TID) | ORAL | Status: DC | PRN
  Administered 2022-05-11 – 2022-05-15 (×9): 25 mg via ORAL
  Filled 2022-05-11 (×11): qty 1

## 2022-05-11 MED ORDER — DILTIAZEM HCL 30 MG PO TABS
30.0000 mg | ORAL_TABLET | Freq: Three times a day (TID) | ORAL | Status: DC
  Administered 2022-05-11 – 2022-05-12 (×4): 30 mg via ORAL
  Filled 2022-05-11 (×4): qty 1

## 2022-05-11 MED ORDER — MAGNESIUM OXIDE -MG SUPPLEMENT 400 (240 MG) MG PO TABS
800.0000 mg | ORAL_TABLET | Freq: Every day | ORAL | Status: DC
  Administered 2022-05-11 – 2022-05-14 (×4): 800 mg via ORAL
  Filled 2022-05-11 (×4): qty 2

## 2022-05-11 MED ORDER — POTASSIUM CHLORIDE CRYS ER 20 MEQ PO TBCR
40.0000 meq | EXTENDED_RELEASE_TABLET | Freq: Once | ORAL | Status: AC
  Administered 2022-05-11: 40 meq via ORAL
  Filled 2022-05-11: qty 2

## 2022-05-11 MED ORDER — FUROSEMIDE 10 MG/ML IJ SOLN
40.0000 mg | Freq: Two times a day (BID) | INTRAMUSCULAR | Status: DC
  Administered 2022-05-11: 40 mg via INTRAVENOUS
  Filled 2022-05-11 (×2): qty 4

## 2022-05-11 MED ORDER — FUROSEMIDE 10 MG/ML IJ SOLN
40.0000 mg | Freq: Once | INTRAMUSCULAR | Status: AC
  Administered 2022-05-11: 40 mg via INTRAVENOUS
  Filled 2022-05-11: qty 4

## 2022-05-11 NOTE — Care Management Important Message (Signed)
Important Message  Patient Details  Name: MACKENZEY CROWNOVER MRN: [id number] Date of Birth: 05-13-1945   Medicare Important Message Given:  Yes     Johnell Comings 05/11/2022, 1:34 PM

## 2022-05-11 NOTE — Progress Notes (Signed)
Physical Therapy Treatment Patient Details Name: Molly Mitchell MRN: [id number] DOB: 1945/02/03 Today's Date: 05/11/2022   History of Present Illness 77 y/o F admitted on 05/03/22 after presenting with c/o SOB, cough, & chest pain x 2 days. Pt is being treated for sepsis 2/2 CAP vs SIRS 2/2 a-fib with RVR & anxiety. PMH: a-fib on eliquis, s/p watchman L atrial appendage closure device placement (04/20/22), HTN, HLD, asthma, diastolic CHF, stroke, CKD3A, chronic pain, carotid artery stenosis, spinal fusion (Jan 2024)    PT Comments    Pt laying in bed in NAD on arrival, HR stable in the low 90s, on 4L O2 with sats in the upper 90s.  Pt eager to try to get up but anxious and unsure about how much she could do.  However she showed good effort with bed mobility and getting to standing and with education about walker use/height/positioning as well as breathing and gait cues she was able to slowly but safely circumambulate the nurses' station with relative confidence.  HR was <110 t/o the effort (did increases with much variability ~2 minutes after sitting down, nursing/cardio aware).  Pt does endorse continued anxiety, but ultimately was pleased with how well she was able to ambulate.  Pt did well, continue with POC.   Recommendations for follow up therapy are one component of a multi-disciplinary discharge planning process, led by the attending physician.  Recommendations may be updated based on patient status, additional functional criteria and insurance authorization.  Follow Up Recommendations       Assistance Recommended at Discharge Intermittent Supervision/Assistance  Patient can return home with the following A little help with bathing/dressing/bathroom;A little help with walking and/or transfers;Assistance with cooking/housework;Assist for transportation;Help with stairs or ramp for entrance   Equipment Recommendations  Rolling walker (2 wheels)    Recommendations for Other Services        Precautions / Restrictions Precautions Precautions: Fall Restrictions Weight Bearing Restrictions: No     Mobility  Bed Mobility Overal bed mobility: Needs Assistance Bed Mobility: Supine to Sit     Supine to sit: Modified independent (Device/Increase time)     General bed mobility comments: good awareness of log roll and easily able to transition to side and up to sitting    Transfers Overall transfer level: Modified independent Equipment used: Rolling walker (2 wheels) Transfers: Sit to/from Stand Sit to Stand: Min guard           General transfer comment: Pt did well with transition to standing with no need for assist and good confidence and control    Ambulation/Gait Ambulation/Gait assistance: Supervision Gait Distance (Feet): 200 Feet Assistive device: Rolling walker (2 wheels)         General Gait Details: Pt reports that she likes using 2 canes in the home due to narrow walkways, but did endorse that the walker felt steady and supportive.  At start of ambulation effort HR in the low 90s, stayed below 110 t/o the entire loop.  On 4L, difficult to get consistent SpO2 readings but despite some minimal fatigue/SOB she seemed to keep %in the 90s.  She had no LOBs, was able to maintain consistent cadence and overall did well and better than she or this PT expected.  Of note: ~2 minutes after sitting she went into Afib with elevated rates fluxuating 120s-170s.   Stairs             Wheelchair Mobility    Modified Rankin (Stroke Patients Only)  Balance Overall balance assessment: Needs assistance Sitting-balance support: Feet supported Sitting balance-Leahy Scale: Good     Standing balance support: During functional activity, Bilateral upper extremity supported, Reliant on assistive device for balance Standing balance-Leahy Scale: Good                              Cognition Arousal/Alertness: Awake/alert Behavior During Therapy:  WFL for tasks assessed/performed, Anxious Overall Cognitive Status: Within Functional Limits for tasks assessed                                          Exercises      General Comments General comments (skin integrity, edema, etc.): Pt did very well with ambulation and vitals remained relatively stable during the effort, however pt remains anxious during the effort      Pertinent Vitals/Pain Pain Assessment Pain Assessment: No/denies pain (reports he back has felt good recently)    Home Living                          Prior Function            PT Goals (current goals can now be found in the care plan section) Progress towards PT goals: Progressing toward goals    Frequency    Min 3X/week      PT Plan Current plan remains appropriate    Co-evaluation              AM-PAC PT "6 Clicks" Mobility   Outcome Measure  Help needed turning from your back to your side while in a flat bed without using bedrails?: A Little Help needed moving from lying on your back to sitting on the side of a flat bed without using bedrails?: A Little Help needed moving to and from a bed to a chair (including a wheelchair)?: A Little Help needed standing up from a chair using your arms (e.g., wheelchair or bedside chair)?: A Little Help needed to walk in hospital room?: A Little Help needed climbing 3-5 steps with a railing? : A Little 6 Click Score: 18    End of Session Equipment Utilized During Treatment: Oxygen;Gait belt Activity Tolerance: Patient tolerated treatment well Patient left: with chair alarm set;in chair;with call bell/phone within reach;with nursing/sitter in room Nurse Communication: Mobility status PT Visit Diagnosis: Muscle weakness (generalized) (M62.81);Difficulty in walking, not elsewhere classified (R26.2)     Time: 1610-9604 PT Time Calculation (min) (ACUTE ONLY): 30 min  Charges:  $Gait Training: 8-22 mins $Therapeutic  Activity: 8-22 mins                     Malachi Pro, DPT 05/11/2022, 9:05 AM

## 2022-05-11 NOTE — Progress Notes (Signed)
PROGRESS NOTE    Molly Mitchell   WUJ:[id number] DOB: [birth year]  DOA: 04/17/2022 Date of Service: 05/11/22 PCP: [facility]     Brief Narrative / Hospital Course:   Molly Mitchell is a 77 y.o. female with medical history significant of A-fib on Eliquis, s/p of Watchman left atrial appendage closure device placement on 04/20/23, hypertension, hyperlipidemia, asthma, diastolic CHF, stroke, CKD-3A, chronic pain, carotid artery stenosis, who presents to the ED from home 04/29/2022 with cough, shortness of breath, chest pain x2 days. Patient recently had Watchman left atrial appendage closure device placement on 04/20/23.  04/17: atrial fibrillation with RVR, heart rate up to 137. Required cardizem drip in ED. Negative PCR for COVID, flu and RSV, WBC 13.7, lactic acid 1.3, procalcitonin 0.19. Urinalysis (cloudy appearance, small amount of leukocyte, negative nitrite, WBC 0-5 with squamous cell contamination 21-50), slightly worsening renal function, BNP 716, trop 6 --> 5. Temperature normal, blood pressure 102/74, RR 25-37, oxygen saturation 97% on room air. Pt is admitted PCU as inpt. Tx for Afib RVR and sepsis d/t CAP.  04/18: significantly anxious, became over sedated w/ valium, low BP, improved in afternoon. Echo reviewed from Watchman device placement - TEE 04/20/22 LVEF 55-60% normal LV and RV fxn, LA mod dilated. Cardiology to adjust rate/rhythm control --> d/c dilt gtt, start po diltiazem, change coreg to metoprolol 12.5 mg po q6h, started amiodarone bolus/gtt. Remained very anxious - alprazolam lower dose administered.  04/19:  converted to sinus rhythm about mid-day. Concern worsening infiltrate on CXR, add azithromycin. 04/20: amio gtt d/c overnight d/t hypotension/bradycardia.CXR personally reviewed - infiltrate worse on L appears more c/w edema/vascular congestion, trial another dose lasix 40 mg IV diuresis. RVR recurred, restarted amiodarone gtt lower dose since had po amio also, BP a  bit low so holding metoprolol po for now. Echo ordered.  04/21: Echo reviewed - EF 60-65%, indeterminate diastolic parameters, enlarged RV, moderately dilated LA and RA, small pericardial effusion w/o tamponade. BP soft. Maintain amio gtt, added midodrine rather than add fluids.  04/22: Overnight HR to 146, given IV metoprolol and converted to sinus but hypotensive, got IV fluids, amio gtt held. Converted to sinus. Somnolent but rousable, reduced/adjusted sedating medications.  04/23: remains in/out Afib RVR. Increased metoprolol frequency. Cardiology hesitant on DCCV - may need repeat imaging to r/o thrombus with recent Watchman placement  4/24-4/25: Remains dyspneic, likely chf from a-fib, diuresing   Consultants:  Cardiology    Procedures: None            ASSESSMENT & PLAN:   Principal Problem:   CAP (community acquired pneumonia) Active Problems:   Sepsis   Chronic diastolic CHF (congestive heart failure)   Chronic atrial fibrillation with RVR   Asthma   HTN (hypertension)   Chronic pain   HLD (hyperlipidemia)   Chronic kidney disease, stage 3a   Normocytic anemia     Sepsis due to CAP (community acquired pneumonia)  vs SIRS d/t Afib RVR and anxiety  Elevated HR and RR can be either/both etiology Met criteria with WBC 13.6, heart rate up to 137 and RR 25-37.  Lactic acid normal 1.3.  Procalcitonin 0.19.    Urine legionella and S. pneumococcal antigen - neg Treated as below    Community Acquired Pneumonia  S/p abx :IV Rocephin and doxycycline to cover PNA, changed doxy to azithromycin Mucinex for cough  Bronchodilators   Chronic atrial fibrillation with acute RVR Difficult to control though overnight and today hr has  normalized Amiodarone gtt per cardiology d/c as of 01:00 05/08/22  PO amio 400mg  BID then on 5/2 200mg  daily thereafter.  Restarted metoprolol and titrating this  Prn dilt today as well Eliquis 5 mg po bid --> continue eliquis 5mg  BID x 45 days  following Watchman as per EP  Cardiology following, appreciate assistance    Acute hypoxic respiratory failure  Supplemental O2 as needed, currently on 3 liters, likely fluid mediated from a-fib, we are diuresing today. Preserved EF on TTE 4/20   Chronic diastolic CHF (congestive heart failure), w/ exacerbation  2D echo on 04/20/2022 showed EF of 55-60%. Bnp elevated I/os not well documented, have spoken to nursing about that Lasix 40 iv today, will also f/u bnp Remains dyspneic, will repeat cxr   Severe anxiety Sensitivity to benzodiazepine (oversedation w/ Valium)  Scheduled BuSpar   Nausea Compazine given refractory Consider phenergan qhs prn    Asthma Bronchodilators   HTN (hypertension): IV hydralazine as needed Meds as above    Chronic pain Continue as needed Norco, reduced dose  Scheduled reduced dose tizanidine  As needed Tylenol   HLD (hyperlipidemia) Pravastatin   AKI on Chronic kidney disease, stage 3a:  Monitor BMP   Normocytic anemia:  Hemoglobin 10.5 (12.1 on 03/22/2022), no active bleeding. monitor     DVT prophylaxis: Eliquis Pertinent IV fluids/nutrition: no continuous IV fluids at this time  Central lines / invasive devices: none   Code Status: FULL CODE ACP documents reviewed: none on file    Current Admission Status: inpatient, progressive unit d/t RVR difficult to control TOC needs / Dispo plan: anticipate d/c to previous home environment w/ HH, per most recent pt eval Barriers to discharge / significant pending items: remains hypoxic and dyspneic         Subjective / Brief ROS:  Main complaint is ongoing dyspnea   Family Communication: husband at bedside 4/24, no answer when telephoned today     Objective Findings:  Vitals:   05/11/22 0906 05/11/22 0907 05/11/22 1105 05/11/22 1135  BP:   92/60 92/60  Pulse:   65 64  Resp: (!) 31 (!) 25 20 20   Temp:    97.8 F (36.6 C)  TempSrc:    Oral  SpO2:   94% 94%  Weight:       Height:        Intake/Output Summary (Last 24 hours) at 05/11/2022 1245 Last data filed at 05/10/2022 1323 Gross per 24 hour  Intake 237 ml  Output --  Net 237 ml     Filed Weights   05/07/22 0324 05/08/22 0500 05/11/22 0405  Weight: 70 kg 70.5 kg 69.4 kg    Examination:  Physical Exam Constitutional:      Appearance: She is not toxic-appearing.  Cardiovascular:     Rate and Rhythm: Normal rate. Rhythm irregular.     Heart sounds: Murmur heard.     Systolic murmur is present.  Pulmonary:     Effort: No tachypnea, accessory muscle usage or respiratory distress.     Breath sounds: Examination of the right-lower field reveals wheezing. Examination of the left-lower field reveals wheezing. Wheezing present.  Musculoskeletal:     Right lower leg: No edema.     Left lower leg: No edema.  Skin:    General: Skin is warm and dry.  Neurological:     General: No focal deficit present.     Mental Status: She is alert and oriented to person, place, and time.  Psychiatric:        Mood and Affect: Mood is anxious.          Scheduled Medications:   amiodarone  400 mg Oral BID   Followed by   Melene Muller ON 05/18/2022] amiodarone  200 mg Oral Daily   apixaban  5 mg Oral BID   busPIRone  5 mg Oral TID   diltiazem  30 mg Oral Q8H   feeding supplement  237 mL Oral BID AC   magnesium oxide  800 mg Oral Daily   metoprolol tartrate  25 mg Oral TID   midodrine  5 mg Oral TID WC   pravastatin  40 mg Oral QHS   tiZANidine  2 mg Oral TID    Continuous Infusions:  promethazine (PHENERGAN) injection (IM or IVPB) 6.25 mg (05/09/22 1821)     PRN Medications:  acetaminophen, albuterol, dextromethorphan-guaiFENesin, HYDROcodone-acetaminophen, ondansetron (ZOFRAN) IV, prochlorperazine, promethazine (PHENERGAN) injection (IM or IVPB)  Antimicrobials from admission:  Anti-infectives (From admission, onward)    Start     Dose/Rate Route Frequency Ordered Stop   05/08/22 1415   azithromycin (ZITHROMAX) tablet 500 mg        500 mg Oral Daily 05/08/22 1324 05/09/22 0809   05/05/22 1600  azithromycin (ZITHROMAX) 500 mg in sodium chloride 0.9 % 250 mL IVPB  Status:  Discontinued        500 mg 250 mL/hr over 60 Minutes Intravenous Every 24 hours 05/05/22 1515 05/08/22 1324   04/19/2022 1900  doxycycline (VIBRA-TABS) tablet 100 mg  Status:  Discontinued        100 mg Oral Every 12 hours 04/18/2022 1129 05/07/22 1219   04/26/2022 1200  cefTRIAXone (ROCEPHIN) 2 g in sodium chloride 0.9 % 100 mL IVPB        2 g 200 mL/hr over 30 Minutes Intravenous Every 24 hours 05/07/2022 1129 05/09/22 1306   05/11/2022 0600  doxycycline (VIBRAMYCIN) 100 mg in sodium chloride 0.9 % 250 mL IVPB        100 mg 125 mL/hr over 120 Minutes Intravenous  Once 05/12/2022 0459 05/16/2022 0810           Data Reviewed:  I have personally reviewed the following...  CBC: Recent Labs  Lab 05/05/22 0502 05/07/22 0520 05/10/22 0504  WBC 19.4* 10.2 13.3*  HGB 10.2* 9.7* 9.7*  HCT 31.7* 30.7* 30.7*  MCV 90.6 91.6 92.5  PLT 490* 407* 411*    Basic Metabolic Panel: Recent Labs  Lab 05/07/22 0520 05/08/22 0441 05/09/22 0407 05/10/22 0504 05/11/22 0616  NA 136 136 138 137 141  K 3.5 3.8 3.9 4.2 3.9  CL 100 101 100 101 101  CO2 GLUCOSE 105* 117* 124* 91 125*  BUN 40* 36* 30* 29* 35*  CREATININE 1.21* 1.32* 1.24* 1.20* 1.11*  CALCIUM 8.3* 8.4* 8.8* 8.9 9.4  MG  --   --  2.0  --  1.8    GFR: Estimated Creatinine Clearance: 39.3 mL/min (A) (by C-G formula based on SCr of 1.11 mg/dL (H)). Liver Function Tests: Recent Labs  Lab 05/05/22 0502  AST 20  ALT 32  ALKPHOS 248*  BILITOT 0.4  PROT 7.4  ALBUMIN 3.3*    No results for input(s): "LIPASE", "AMYLASE" in the last 168 hours.  No results for input(s): "AMMONIA" in the last 168 hours. Coagulation Profile: No results for input(s): "INR", "PROTIME" in the last 168 hours. Cardiac Enzymes: No results for input(s):  "CKTOTAL", "CKMB", "  CKMBINDEX", "TROPONINI" in the last 168 hours. BNP (last 3 results) No results for input(s): "PROBNP" in the last 8760 hours. HbA1C: No results for input(s): "HGBA1C" in the last 72 hours. CBG: No results for input(s): "GLUCAP" in the last 168 hours. Lipid Profile: No results for input(s): "CHOL", "HDL", "LDLCALC", "TRIG", "CHOLHDL", "LDLDIRECT" in the last 72 hours. Thyroid Function Tests: No results for input(s): "TSH", "T4TOTAL", "FREET4", "T3FREE", "THYROIDAB" in the last 72 hours.  Anemia Panel: No results for input(s): "VITAMINB12", "FOLATE", "FERRITIN", "TIBC", "IRON", "RETICCTPCT" in the last 72 hours. Most Recent Urinalysis On File:     Component Value Date/Time   COLORURINE YELLOW (A) 04/21/2022 0811   APPEARANCEUR CLOUDY (A) 05/05/2022 0811   APPEARANCEUR Clear 02/19/2013 0916   LABSPEC 1.018 05/02/2022 0811   LABSPEC 1.010 02/19/2013 0916   PHURINE 5.0 05/12/2022 0811   GLUCOSEU NEGATIVE 04/19/2022 0811   GLUCOSEU Negative 02/19/2013 0916   HGBUR NEGATIVE 04/20/2022 0811   BILIRUBINUR NEGATIVE 04/20/2022 0811   BILIRUBINUR negative 10/23/2017 1324   BILIRUBINUR Negative 02/19/2013 0916   KETONESUR NEGATIVE 04/22/2022 0811   PROTEINUR 30 (A) 05/12/2022 0811   UROBILINOGEN 0.2 10/23/2017 1324   NITRITE NEGATIVE 04/26/2022 0811   LEUKOCYTESUR SMALL (A) 05/11/2022 0811   LEUKOCYTESUR Negative 02/19/2013 0916   Sepsis Labs: @LABRCNTIP (procalcitonin:4,lacticidven:4) Microbiology: Recent Results (from the past 240 hour(s))  Culture, blood (routine x 2)     Status: None   Collection Time: 05/12/2022  5:37 AM   Specimen: BLOOD RIGHT ARM  Result Value Ref Range Status   Specimen Description BLOOD RIGHT ARM  Final   Special Requests   Final    BOTTLES DRAWN AEROBIC AND ANAEROBIC Blood Culture adequate volume   Culture   Final    NO GROWTH 5 DAYS Performed at [facility], [address] [place], [place] [address]    Report Status  05/08/2022 FINAL  Final  Resp panel by RT-PCR (RSV, Flu A&B, Covid) Anterior Nasal Swab     Status: None   Collection Time: 04/28/2022  5:37 AM   Specimen: Anterior Nasal Swab  Result Value Ref Range Status   SARS Coronavirus 2 by RT PCR NEGATIVE NEGATIVE Final    Comment: (NOTE) SARS-CoV-2 target nucleic acids are NOT DETECTED.  The SARS-CoV-2 RNA is generally detectable in upper respiratory specimens during the acute phase of infection. The lowest concentration of SARS-CoV-2 viral copies this assay can detect is 138 copies/mL. A negative result does not preclude SARS-Cov-2 infection and should not be used as the sole basis for treatment or other patient management decisions. A negative result may occur with  improper specimen collection/handling, submission of specimen other than nasopharyngeal swab, presence of viral mutation(s) within the areas targeted by this assay, and inadequate number of viral copies(<138 copies/mL). A negative result must be combined with clinical observations, patient history, and epidemiological information. The expected result is Negative.  Fact Sheet for Patients:  BloggerCourse.com  Fact Sheet for Healthcare Providers:  SeriousBroker.it  This test is no t yet approved or cleared by the [place] FDA and  has been authorized for detection and/or diagnosis of SARS-CoV-2 by FDA under an Emergency Use Authorization (EUA). This EUA will remain  in effect (meaning this test can be used) for the duration of the COVID-19 declaration under Section 564(b)(1) of the Act, 21 U.S.C.section 360bbb-3(b)(1), unless the authorization is terminated  or revoked sooner.       Influenza A by PCR NEGATIVE NEGATIVE Final   Influenza  B by PCR NEGATIVE NEGATIVE Final    Comment: (NOTE) The Xpert Xpress SARS-CoV-2/FLU/RSV plus assay is intended as an aid in the diagnosis of influenza from Nasopharyngeal swab specimens  and should not be used as a sole basis for treatment. Nasal washings and aspirates are unacceptable for Xpert Xpress SARS-CoV-2/FLU/RSV testing.  Fact Sheet for Patients: BloggerCourse.com  Fact Sheet for Healthcare Providers: SeriousBroker.it  This test is not yet approved or cleared by the [place] FDA and has been authorized for detection and/or diagnosis of SARS-CoV-2 by FDA under an Emergency Use Authorization (EUA). This EUA will remain in effect (meaning this test can be used) for the duration of the COVID-19 declaration under Section 564(b)(1) of the Act, 21 U.S.C. section 360bbb-3(b)(1), unless the authorization is terminated or revoked.     Resp Syncytial Virus by PCR NEGATIVE NEGATIVE Final    Comment: (NOTE) Fact Sheet for Patients: BloggerCourse.com  Fact Sheet for Healthcare Providers: SeriousBroker.it  This test is not yet approved or cleared by the [place] FDA and has been authorized for detection and/or diagnosis of SARS-CoV-2 by FDA under an Emergency Use Authorization (EUA). This EUA will remain in effect (meaning this test can be used) for the duration of the COVID-19 declaration under Section 564(b)(1) of the Act, 21 U.S.C. section 360bbb-3(b)(1), unless the authorization is terminated or revoked.  Performed at [facility], [address] [place], [place] [address]   Culture, blood (routine x 2)     Status: None   Collection Time: 05/04/22  2:54 PM   Specimen: BLOOD  Result Value Ref Range Status   Specimen Description BLOOD BLOOD LEFT HAND  Final   Special Requests   Final    BOTTLES DRAWN AEROBIC AND ANAEROBIC Blood Culture adequate volume   Culture   Final    NO GROWTH 5 DAYS Performed at [facility], [address] [place], [place] [address]    Report Status 05/09/2022 FINAL  Final      Radiology Studies  last 3 days: No results found.           LOS: 8 days       Silvano Bilis, MD [facility] 05/11/2022, 12:45 PM    Staff may message me via secure chat in Epic  but this may not receive an immediate response,  please page me for urgent matters!  If 7PM-7AM, please contact night coverage [email or web address]

## 2022-05-11 NOTE — Progress Notes (Signed)
[facility] CLINIC CARDIOLOGY CONSULT NOTE       Patient ID: Molly Mitchell MRN: [id number] DOB/AGE: 02-16-1945 77 y.o.  Admit date: 05/06/2022 Referring Physician Dr. Sunnie Nielsen  Primary Physician Dr. Nemiah Commander Primary Cardiologist Minda Ditto, PA-C  Reason for Consultation AF RVR  HPI: Molly Mitchell is a 77yoF with a PMH of paroxysmal AF s/p recent Watchman (LAAO closure) placement 4/4, HFpEF, right CEA (09/2021), s/p spinal fusion who presented to [facility] ED 05/11/2022 with cough, shortness of breath, and chest pain for 2 days. Initial concern for CAP, but symptoms ultimately felt to be more related to acute on chronic HFpEF and decompensation from atrial fibrillation with RVR.  Cardiology is consulted for further assistance.  Interval History:  -Again, remains in NSR with rates in the 60s overnight, even worked with physical therapy this morning and ambulated a lap around the nurses station with a rolling walker and did well.  Still having paroxysms of AF RVR with rates in the 140s-150s, back in NSR later morning after addition of diltiazem. -Feels extremely anxious and dyspneic while in atrial fibrillation, tearful. -No chest pain or palpitations   Past Medical History:  Diagnosis Date   Acute right MCA stroke 08/01/2021   a.) noted on MRI head 08/01/2021 --> punctate acute RIGHT MCA territory infarcts affecting the frontal and parietal contex consistent with microembolic infarcts in the RIGHT carotid circulation   Anemia    Arthritis    Bilateral carotid artery disease 08/18/2021   a.) CTA neck 08/18/2021: 70-80% RICA and 40% LICA   Cervicalgia    Chronic kidney disease    Chronic pain syndrome    Chronic, continuous use of opioids    a.) hydrocodone/APAP (Norco 10/325 mg)   Connective tissue disease    pt reports being diagnosed with undifferentiated connective tissue disease at some point- pt took steroids for this and has had no issues since- 10 years ago   Coronary  artery disease    Cortical senile cataract    COVID 2021   DDD (degenerative disc disease), cervical    a.) s/p C4-C6 ACDF   DDD (degenerative disc disease), lumbar    a.) s/p laminectomy + posterior L2-S1 fusion   Dysrhythmia    A-fib   History of 2019 novel coronavirus disease (COVID-19) 12/07/2018   History of blood transfusion    History of heart murmur in childhood    History of rheumatic fever    HLD (hyperlipidemia)    Hypertension    Lower extremity weakness    a.) with position changes from sitting to standing   Osteoporosis    Presence of Watchman left atrial appendage closure device 04/20/2022   27mm Watchman FLX placed by Dr. Lalla Brothers   Pseudogout    Shingles    Skin cancer of face    Spondylolisthesis of lumbar region     Past Surgical History:  Procedure Laterality Date   ABDOMINAL HYSTERECTOMY  1981   AIKEN OSTEOTOMY Right 01/30/2018   Procedure: WEIL RIGHT 2ND AND 3RD;  Surgeon: Gwyneth Revels, DPM;  Location: [facility];  Service: Podiatry;  Laterality: Right;   ANTERIOR CERVICAL DECOMP/DISCECTOMY FUSION N/A 2003   C4-C6   BACK SURGERY     pt reports hx of 3 lumber back surgeries total   CERVICAL SPINE SURGERY     pt reports having 3 cervical fusions total   ENDARTERECTOMY Right 09/21/2021   Procedure: ENDARTERECTOMY CAROTID;  Surgeon: Renford Dills, MD;  Location: [facility] ORS;  Service: Vascular;  Laterality: Right;  Right carotid   HAMMER TOE SURGERY Right 01/30/2018   Procedure: HAMMER TOE CORRECTION SECOND AND THIRD;  Surgeon: Gwyneth Revels, DPM;  Location: [facility];  Service: Podiatry;  Laterality: Right;  GENERAL WITH LOCAL   HAMMER TOE SURGERY Left 03/05/2019   Procedure: HAMMER TOE CORRECTION T1 AND T2;  Surgeon: Gwyneth Revels, DPM;  Location: [facility];  Service: Podiatry;  Laterality: Left;   LEFT ATRIAL APPENDAGE OCCLUSION N/A 04/20/2022   Procedure: LEFT ATRIAL APPENDAGE OCCLUSION;  Surgeon: Lanier Prude, MD;   Location: [facility] INVASIVE CV LAB;  Service: Cardiovascular;  Laterality: N/A;   POSTERIOR LUMBAR FUSION N/A 2001   L2-S1   ROTATOR CUFF REPAIR Right 1981   SACROPLASTY N/A 03/29/2017   S1   TEE WITHOUT CARDIOVERSION N/A 04/20/2022   Procedure: TRANSESOPHAGEAL ECHOCARDIOGRAM;  Surgeon: Lanier Prude, MD;  Location: [facility] INVASIVE CV LAB;  Service: Cardiovascular;  Laterality: N/A;   TOTAL KNEE ARTHROPLASTY Left 03/03/2013   TOTAL KNEE REVISION Left 07/31/2016   Procedure: TOTAL KNEE REVISION;  Surgeon: Donato Heinz, MD;  Location: [facility] ORS;  Service: Orthopedics;  Laterality: Left;   WEIL OSTEOTOMY Left 03/05/2019   Procedure: WEIL OSTEOTOMY X 2 LEFT;  Surgeon: Gwyneth Revels, DPM;  Location: [facility];  Service: Podiatry;  Laterality: Left;    Medications Prior to Admission  Medication Sig Dispense Refill Last Dose   amLODipine (NORVASC) 2.5 MG tablet Take 2.5-5 mg by mouth See admin instructions. Take 2.5 mg in the morning and 5 mg at bedtime   Past Week   amoxicillin (AMOXIL) 500 MG capsule Take 4 tablets by mouth 1 hour prior to dental procedures. 12 capsule 3 unknown   apixaban (ELIQUIS) 5 MG TABS tablet Take 1 tablet (5 mg total) by mouth 2 (two) times daily. 60 tablet  05/02/2022   carvedilol (COREG) 3.125 MG tablet Take 3.125 mg by mouth 2 (two) times daily with a meal.   Past Week   HYDROcodone-acetaminophen (NORCO/VICODIN) 5-325 MG tablet Take 2 tablets by mouth 4 (four) times daily.   Past Week   pravastatin (PRAVACHOL) 40 MG tablet Take 40 mg by mouth at bedtime.   05/02/2022   telmisartan (MICARDIS) 80 MG tablet Take 80 mg by mouth daily.   Past Week   tiZANidine (ZANAFLEX) 4 MG tablet Take 1 tablet (4 mg total) by mouth 2 (two) times daily.   05/02/2022    Social History   Socioeconomic History   Marital status: Married    Spouse name: [name]   Number of children: 2   Years of education: Not on file   Highest education level: High school graduate  Occupational  History   Occupation: retired   Occupation: Retired Engineer, [employer]  Tobacco Use   Smoking status: Former    Packs/day: 1.00    Years: 15.00    Additional pack years: 0.00    Total pack years: 15.00    Types: Cigarettes    Quit date: 04/17/1979    Years since quitting: 43.0   Smokeless tobacco: Never  Vaping Use   Vaping Use: Never used  Substance and Sexual Activity   Alcohol use: No    Alcohol/week: 0.0 standard drinks of alcohol   Drug use: No   Sexual activity: Not on file  Other Topics Concern   Not on file  Social History Narrative   Not on file   Social Determinants of Health  Financial Resource Strain: Low Risk  (03/23/2020)   Overall Financial Resource Strain (CARDIA)    Difficulty of Paying Living Expenses: Not hard at all  Food Insecurity: No Food Insecurity (05/04/2022)   Hunger Vital Sign    Worried About Running Out of Food in the Last Year: Never true    Ran Out of Food in the Last Year: Never true  Transportation Needs: No Transportation Needs (05/04/2022)   PRAPARE - Administrator, Civil Service (Medical): No    Lack of Transportation (Non-Medical): No  Physical Activity: Sufficiently Active (03/23/2020)   Exercise Vital Sign    Days of Exercise per Week: 3 days    Minutes of Exercise per Session: 60 min  Stress: No Stress Concern Present (03/23/2020)   [employer] of Occupational Health - Occupational Stress Questionnaire    Feeling of Stress : Not at all  Social Connections: Socially Integrated (03/23/2020)   Social Connection and Isolation Panel [NHANES]    Frequency of Communication with Friends and Family: Never    Frequency of Social Gatherings with Friends and Family: More than three times a week    Attends Religious Services: More than 4 times per year    Active Member of Clubs or Organizations: Yes    Attends Banker Meetings: More than 4 times per year    Marital Status: Married  Catering manager  Violence: Not At Risk (05/04/2022)   Humiliation, Afraid, Rape, and Kick questionnaire    Fear of Current or Ex-Partner: No    Emotionally Abused: No    Physically Abused: No    Sexually Abused: No    Family History  Problem Relation Age of Onset   Hypertension Brother    Hypertension Mother    Hypertension Sister    Cancer Sister    Diabetes Paternal Grandmother    Heart failure Maternal Grandmother       Intake/Output Summary (Last 24 hours) at 05/11/2022 0811 Last data filed at 05/10/2022 1323 Gross per 24 hour  Intake 237 ml  Output --  Net 237 ml     Vitals:   05/11/22 0300 05/11/22 0400 05/11/22 0405 05/11/22 0500  BP: (!) 110/59     Pulse: 70 83  72  Resp:  19  (!) 21  Temp: 97.8 F (36.6 C)     TempSrc: Oral     SpO2: 98% 95%  99%  Weight:   69.4 kg   Height:        PHYSICAL EXAM General: elderly caucasian female, sitting upright in recliner with nurse at bedside.   HEENT:  Normocephalic and atraumatic. Neck:  No JVD.  Lungs: Normal respiratory effort on O2 by . Crackles in left base Heart: tachy irregular irregular . Normal S1 and S2 without gallops or murmurs.  Abdomen: Non-distended appearing.  Msk: Normal strength and tone for age. Extremities: Warm and well perfused. No clubbing, cyanosis. Trace bilateral LE edema.  Neuro: alert and oriented  Psych:  anxious mood, tearful  Labs: Basic Metabolic Panel: Recent Labs    05/09/22 0407 05/10/22 0504 05/11/22 0616  NA 138 137 141  K 3.9 4.2 3.9  CL 100 101 101  CO2 27 25 30   GLUCOSE 124* 91 125*  BUN 30* 29* 35*  CREATININE 1.24* 1.20* 1.11*  CALCIUM 8.8* 8.9 9.4  MG 2.0  --   --     Liver Function Tests: No results for input(s): "AST", "ALT", "ALKPHOS", "BILITOT", "PROT", "ALBUMIN" in the  last 72 hours.  No results for input(s): "LIPASE", "AMYLASE" in the last 72 hours.  CBC: Recent Labs    05/10/22 0504  WBC 13.3*  HGB 9.7*  HCT 30.7*  MCV 92.5  PLT 411*    Cardiac  Enzymes: No results for input(s): "CKTOTAL", "CKMB", "CKMBINDEX", "TROPONINIHS" in the last 72 hours.  BNP: Recent Labs    05/10/22 0504  BNP 698.5*    D-Dimer: No results for input(s): "DDIMER" in the last 72 hours. Hemoglobin A1C: No results for input(s): "HGBA1C" in the last 72 hours. Fasting Lipid Panel: No results for input(s): "CHOL", "HDL", "LDLCALC", "TRIG", "CHOLHDL", "LDLDIRECT" in the last 72 hours. Thyroid Function Tests: No results for input(s): "TSH", "T4TOTAL", "T3FREE", "THYROIDAB" in the last 72 hours.  Invalid input(s): "FREET3"  Anemia Panel: No results for input(s): "VITAMINB12", "FOLATE", "FERRITIN", "TIBC", "IRON", "RETICCTPCT" in the last 72 hours.   Radiology: ECHOCARDIOGRAM COMPLETE  Result Date: 05/06/2022    ECHOCARDIOGRAM REPORT   Patient Name:   KIERAN NACHTIGAL Date of Exam: 05/06/2022 Medical Rec #:  [id number]      Height:       62.0 in Accession #:    [id number]     Weight:       149.9 lb Date of Birth:  07-Jun-1945       BSA:          1.691 m Patient Age:    77 years       BP:           95/65 mmHg Patient Gender: F              HR:           107 bpm. Exam Location:  [facility] Procedure: 2D Echo Indications:     Atrial Fibrillation I48.91  History:         Patient has prior history of Echocardiogram examinations, most                  recent 04/20/2022.  Sonographer:     Overton Mam RDCS, FASE Referring Phys:  Lajuana Carry A KHAN Diagnosing Phys: Adrian Blackwater  Sonographer Comments: Technically difficult study due to poor echo windows. Image acquisition challenging due to respiratory motion. The patient had coughing spells throughout this study. IMPRESSIONS  1. Left ventricular ejection fraction, by estimation, is 60 to 65%. The left ventricle has normal function. The left ventricle has no regional wall motion abnormalities. There is mild concentric left ventricular hypertrophy. Left ventricular diastolic parameters are indeterminate.  2. Right ventricular systolic  function is normal. The right ventricular size is moderately enlarged.  3. Left atrial size was moderately dilated.  4. Right atrial size was moderately dilated.  5. A small pericardial effusion is present. The pericardial effusion is circumferential. There is no evidence of cardiac tamponade.  6. The mitral valve is normal in structure. Mild mitral valve regurgitation. No evidence of mitral stenosis.  7. The aortic valve is normal in structure. Aortic valve regurgitation is not visualized. No aortic stenosis is present.  8. The inferior vena cava is normal in size with greater than 50% respiratory variability, suggesting right atrial pressure of 3 mmHg. FINDINGS  Left Ventricle: Left ventricular ejection fraction, by estimation, is 60 to 65%. The left ventricle has normal function. The left ventricle has no regional wall motion abnormalities. The left ventricular internal cavity size was normal in size. There is  mild concentric left ventricular hypertrophy. Left ventricular diastolic parameters are  indeterminate. Right Ventricle: The right ventricular size is moderately enlarged. No increase in right ventricular wall thickness. Right ventricular systolic function is normal. Left Atrium: Left atrial size was moderately dilated. Right Atrium: Right atrial size was moderately dilated. Pericardium: A small pericardial effusion is present. The pericardial effusion is circumferential. There is no evidence of cardiac tamponade. Mitral Valve: The mitral valve is normal in structure. Mild mitral valve regurgitation. No evidence of mitral valve stenosis. Tricuspid Valve: The tricuspid valve is normal in structure. Tricuspid valve regurgitation is mild . No evidence of tricuspid stenosis. Aortic Valve: The aortic valve is normal in structure. Aortic valve regurgitation is not visualized. No aortic stenosis is present. Aortic valve peak gradient measures 10.9 mmHg. Pulmonic Valve: The pulmonic valve was normal in structure.  Pulmonic valve regurgitation is not visualized. No evidence of pulmonic stenosis. Aorta: The aortic root is normal in size and structure. Venous: The inferior vena cava is normal in size with greater than 50% respiratory variability, suggesting right atrial pressure of 3 mmHg. IAS/Shunts: No atrial level shunt detected by color flow Doppler.  LEFT VENTRICLE PLAX 2D LVIDd:         3.80 cm   Diastology LVIDs:         2.70 cm   LV e' medial:    13.40 cm/s LV PW:         1.00 cm   LV E/e' medial:  6.7 LV IVS:        1.00 cm   LV e' lateral:   8.27 cm/s LVOT diam:     1.80 cm   LV E/e' lateral: 10.8 LV SV:         58 LV SV Index:   34 LVOT Area:     2.54 cm  RIGHT VENTRICLE RV Basal diam:  2.80 cm RV S prime:     12.10 cm/s TAPSE (M-mode): 1.0 cm LEFT ATRIUM           Index        RIGHT ATRIUM           Index LA diam:      3.60 cm 2.13 cm/m   RA Area:     16.20 cm LA Vol (A4C): 30.2 ml 17.86 ml/m  RA Volume:   45.30 ml  26.78 ml/m  AORTIC VALVE                 PULMONIC VALVE AV Area (Vmax): 1.77 cm     PV Vmax:        1.04 m/s AV Vmax:        165.00 cm/s  PV Peak grad:   4.3 mmHg AV Peak Grad:   10.9 mmHg    RVOT Peak grad: 3 mmHg LVOT Vmax:      115.00 cm/s LVOT Vmean:     75.800 cm/s LVOT VTI:       0.226 m  AORTA Ao Root diam: 3.00 cm Ao Asc diam:  3.20 cm MITRAL VALVE               TRICUSPID VALVE MV Area (PHT): 2.94 cm    TR Peak grad:   22.3 mmHg MV Decel Time: 258 msec    TR Vmax:        236.00 cm/s MV E velocity: 89.60 cm/s                            SHUNTS  Systemic VTI:  0.23 m                            Systemic Diam: 1.80 cm Adrian Blackwater Electronically signed by Adrian Blackwater Signature Date/Time: 05/06/2022/11:04:36 PM    Final    DG Chest Port 1 View  Result Date: 05/06/2022 CLINICAL DATA:  Shortness of breath EXAM: PORTABLE CHEST 1 VIEW COMPARISON:  05/05/2018 FINDINGS: Cardiac shadow is enlarged but stable. Watchman device is again seen. Postsurgical changes in the  thoracolumbar spine are noted. Slight increased central vascular congestion is noted with mild interstitial edema. Small effusions are noted bilaterally. IMPRESSION: Slight increase in vascular congestion with mild interstitial edema. Small effusions are again noted. Electronically Signed   By: Alcide Clever M.D.   On: 05/06/2022 09:29   DG Chest Port 1 View  Result Date: 05/05/2022 CLINICAL DATA:  Shortness of breath. EXAM: PORTABLE CHEST 1 VIEW COMPARISON:  04/19/2022. FINDINGS: Increasing bibasilar opacities, left-greater-than-right, suspicious for aspiration or multifocal pneumonia. Unchanged small left pleural effusion. Stable cardiac and mediastinal contours. No pneumothorax. IMPRESSION: Increasing bibasilar opacities, greater on the left, suspicious for aspiration or multifocal pneumonia. Electronically Signed   By: Orvan Falconer M.D.   On: 05/05/2022 13:20   DG Chest Port 1 View  Result Date: 04/18/2022 CLINICAL DATA:  Chest pain EXAM: PORTABLE CHEST 1 VIEW COMPARISON:  04/20/2022 FINDINGS: Mild bilateral lower lobe opacities, left greater than right, atelectasis versus pneumonia. Small left pleural effusion, new. No frank interstitial edema. Cardiomegaly. Thoracolumbar spine fixation hardware. Cervical spine fixation hardware, incompletely visualized. IMPRESSION: Mild bilateral lower lobe opacities, left greater than right, atelectasis versus pneumonia. Small left pleural effusion. Electronically Signed   By: Charline Bills M.D.   On: 04/25/2022 03:37   DG Chest 2 View  Result Date: 04/22/2022 CLINICAL DATA:  76 year old female, preoperative evaluation. EXAM: CHEST - 2 VIEW COMPARISON:  10/13/2013, 01/30/2022 FINDINGS: The mediastinal contours are within normal limits. No cardiomegaly. The lungs are clear bilaterally without evidence of focal consolidation, pleural effusion, or pneumothorax. Multilevel, incompletely visualized thoracolumbar fusion hardware in place. Lower cervical ACDF  hardware in place. No acute osseous abnormality. IMPRESSION: No acute cardiopulmonary process. Electronically Signed   By: Marliss Coots M.D.   On: 04/22/2022 11:34   EP STUDY  Result Date: 04/20/2022 CONCLUSIONS: 1.Successful implantation of a WATCHMAN left atrial appendage occlusive device   2. TEE demonstrating no LAA thrombus 3. No early apparent complications. Post Implant Anticoagulation Strategy: Continue Eliquis 5mg  PO BID x 45 days after implant. After 45 days, stop Eliquis and start Plavix 75mg  PO daily to complete 6 months of post implant therapy. Plan for CT scan 60 days after implant to assess Watchman position.   ECHO TEE  Result Date: 04/20/2022    TRANSESOPHOGEAL ECHO REPORT   Patient Name:   BRITENY FULGHUM Date of Exam: 04/20/2022 Medical Rec #:  [id number]      Height:       62.0 in Accession #:    [id number]     Weight:       145.0 lb Date of Birth:  1945/05/23       BSA:          1.667 m Patient Age:    77 years       BP:           158/56 mmHg Patient Gender: F  HR:           76 bpm. Exam Location:  Inpatient Procedure: Transesophageal Echo, 3D Echo, Color Doppler and Cardiac Doppler Indications:     I48.2 Chronic atrial fibrillation  History:         Patient has prior history of Echocardiogram examinations, most                  recent 09/20/2021. Arrythmias:Atrial Fibrillation; Risk                  Factors:Hypertension and Dyslipidemia.  Sonographer:     Irving Burton Senior RDCS Referring Phys:  [id number] Lanier Prude Diagnosing Phys: Riley Lam MD  Sonographer Comments: 27mm Watchman FLX Implanted PROCEDURE: After discussion of the risks and benefits of a TEE, an informed consent was obtained from the patient. The transesophogeal probe was passed without difficulty through the esophogus of the patient. Sedation performed by different physician. The patient was monitored while under deep sedation. The patient developed no complications during the procedure.  IMPRESSIONS  1.  Prior to procedure, patent left atrial appendage. Maximal diameter 2.19 cm (ostial measurements). Sufficient sizing for a 27 mm Watchman FLX device.  2. A mid-posterior transeptal puncture was performed.  3. a 27 mm Watchman FLX was deployed. Greater than 1/2 shoulder seen. Small tissue compression seen at the anterior mitral valve annulus. No thrombus seen. Device recaptured.  4. At redeployment a 27 mm Watchman FLX was present. No peri-device leak. No thrombus. A 1/3-1/2 mitral shoulder was noted in the 135 view. Average compression ~ 15%.  5. No significant pericardial effusion seen throughout study.  6. Iatrogenic ASD present. Majority of flow was left to right, rare right to left shunting.  7. Left ventricular ejection fraction, by estimation, is 55 to 60%. The left ventricle has normal function.  8. Right ventricular systolic function is normal. The right ventricular size is normal.  9. Left atrial size was moderately dilated. No left atrial/left atrial appendage thrombus was detected. 10. Right atrial size was mildly dilated. 11. The mitral valve is normal in structure. Mild mitral valve regurgitation. 12. The aortic valve is tricuspid. Aortic valve regurgitation is not visualized. No aortic stenosis is present. 13. Evidence of atrial level shunting detected by color flow Doppler. FINDINGS  Left Ventricle: Left ventricular ejection fraction, by estimation, is 55 to 60%. The left ventricle has normal function. The left ventricular internal cavity size was normal in size. Right Ventricle: The right ventricular size is normal. Right vetricular wall thickness was not well visualized. Right ventricular systolic function is normal. Left Atrium: Left atrial size was moderately dilated. No left atrial/left atrial appendage thrombus was detected. Right Atrium: Right atrial size was mildly dilated. Pericardium: There is no evidence of pericardial effusion. Mitral Valve: The mitral valve is normal in structure. Mild  mitral valve regurgitation. Tricuspid Valve: The tricuspid valve is normal in structure. Tricuspid valve regurgitation is trivial. No evidence of tricuspid stenosis. Aortic Valve: The aortic valve is tricuspid. Aortic valve regurgitation is not visualized. No aortic stenosis is present. Pulmonic Valve: The pulmonic valve was normal in structure. Pulmonic valve regurgitation is not visualized. No evidence of pulmonic stenosis. Aorta: The aortic root, ascending aorta, aortic arch and descending aorta are all structurally normal, with no evidence of dilitation or obstruction. IAS/Shunts: Evidence of atrial level shunting detected by color flow Doppler. Additional Comments: Spectral Doppler performed. Riley Lam MD Electronically signed by Riley Lam MD Signature Date/Time: 04/20/2022/10:50:03 AM  Final     ECHO - TEE A04/04/2022  1. Prior to procedure, patent left atrial appendage. Maximal diameter  2.19 cm (ostial measurements). Sufficient sizing for a 27 mm Watchman FLX  device.   2. A mid-posterior transeptal puncture was performed.   3. a 27 mm Watchman FLX was deployed. Greater than 1/2 shoulder seen.  Small tissue compression seen at the anterior mitral valve annulus. No  thrombus seen. Device recaptured.   4. At redeployment a 27 mm Watchman FLX was present. No peri-device leak.  No thrombus. A 1/3-1/2 mitral shoulder was noted in the 135 view. Average  compression ~ 15%.   5. No significant pericardial effusion seen throughout study.   6. Iatrogenic ASD present. Majority of flow was left to right, rare right  to left shunting.   7. Left ventricular ejection fraction, by estimation, is 55 to 60%. The  left ventricle has normal function.   8. Right ventricular systolic function is normal. The right ventricular  size is normal.   9. Left atrial size was moderately dilated. No left atrial/left atrial  appendage thrombus was detected.  10. Right atrial size was mildly  dilated.  11. The mitral valve is normal in structure. Mild mitral valve  regurgitation.  12. The aortic valve is tricuspid. Aortic valve regurgitation is not  visualized. No aortic stenosis is present.  13. Evidence of atrial level shunting detected by color flow Doppler.    TELEMETRY reviewed by me (LT) 05/11/2022 : overnight NSR rate 60s, converted back to AF RVR early AM with rates 140s-160s,   EKG reviewed by me: NSR 74 nonspecific TW abnormality   Data reviewed by me (LT) 05/11/2022:  hospitalist progress note, nursing notes last 24h vitals tele labs imaging I/O    Principal Problem:   Chronic atrial fibrillation with RVR Active Problems:   HLD (hyperlipidemia)   HTN (hypertension)   Chronic diastolic CHF (congestive heart failure)   CAP (community acquired pneumonia)   Chronic kidney disease, stage 3a   Chronic pain   Asthma   Normocytic anemia   Sepsis    ASSESSMENT AND PLAN:  Molly Mitchell is a 2yoF with a PMH of paroxysmal AF s/p recent Watchman (LAAO closure) placement 4/4, HFpEF, right CEA (09/2021), s/p spinal fusion who presented to [facility] ED 05/04/2022 with cough, shortness of breath, and chest pain for 2 days. Initial concern for CAP, but symptoms ultimately felt to be more related to acute on chronic HFpEF and decompensation from atrial fibrillation with RVR.  Cardiology is consulted for further assistance.  # paroxysmal AF RVR # s/p Watchman LAAO closure device 04/20/2022 Presents with shortness of breath and chest pain x 2 days, in AF RVR with difficult to control HR. Very symptomatic with anxiety.  - s/p diltiazem infusion with borderline low BP - s/p IV amiodarone bolus and multiple days of IV infusion.  -Continue PO amio to 400mg  BID for 7 more days, then 200mg  daily thereafter. -Continue metoprolol tartrate 25mg  TID -Add Cardizem 30 mg p.o. 3 times daily - continue eliquis 5mg  BID x 45 days following Watchman as per EP  - baseline LFTs: AST/ALT 20/32  respectively  - TSH 1.5, free T4 1.12 - careful dosing of anxiolytics as patient became very sedated on valium - hesitant to consider DCCV before repeat imaging to r/o thrombus with recent Watchman placement, and currently not indicated since her AF is paroxysmal (converts to NSR every evening)   # acute hypoxic respiratory failure  # ?  CAP # acute on chronic HFpEF  BNP elevated at 700. Clinically somewhat volume up with trace peripheral edema and an oxygen requirement. Initial Cxr with small pleural effusion.  Completed Abx for CAP.  - give  IV lasix x 1 today  - careful with medications the prolong the QT interval while on amio   # AKI on CKD 3 Renal function improving. Continue to monitor closely.   This patient's plan of care was discussed and created with Dr. Juliann Pares and he is in agreement.  Signed: Rebeca Allegra , PA-C 05/11/2022, 8:11 AM [facility] Card[facility]

## 2022-05-12 ENCOUNTER — Inpatient Hospital Stay: Payer: Medicare HMO

## 2022-05-12 DIAGNOSIS — I482 Chronic atrial fibrillation, unspecified: Secondary | ICD-10-CM | POA: Diagnosis not present

## 2022-05-12 LAB — COMPREHENSIVE METABOLIC PANEL
ALT: 26 U/L (ref 0–44)
AST: 29 U/L (ref 15–41)
Albumin: 3.1 g/dL — ABNORMAL LOW (ref 3.5–5.0)
Alkaline Phosphatase: 187 U/L — ABNORMAL HIGH (ref 38–126)
Anion gap: 10 (ref 5–15)
BUN: 48 mg/dL — ABNORMAL HIGH (ref 8–23)
CO2: 31 mmol/L (ref 22–32)
Calcium: 9.4 mg/dL (ref 8.9–10.3)
Chloride: 99 mmol/L (ref 98–111)
Creatinine, Ser: 1.64 mg/dL — ABNORMAL HIGH (ref 0.44–1.00)
GFR, Estimated: 32 mL/min — ABNORMAL LOW (ref >60)
Glucose, Bld: 124 mg/dL — ABNORMAL HIGH (ref 70–99)
Potassium: 5.4 mmol/L — ABNORMAL HIGH (ref 3.5–5.1)
Sodium: 140 mmol/L (ref 135–145)
Total Bilirubin: 0.4 mg/dL (ref 0.3–1.2)
Total Protein: 6.7 g/dL (ref 6.5–8.1)

## 2022-05-12 LAB — PROTEIN, PLEURAL OR PERITONEAL FLUID: Total protein, fluid: <3 g/dL

## 2022-05-12 LAB — BODY FLUID CELL COUNT WITH DIFFERENTIAL
Eos, Fluid: 0 %
Lymphs, Fluid: 35 %
Monocyte-Macrophage-Serous Fluid: 8 %
Neutrophil Count, Fluid: 57 %
Total Nucleated Cell Count, Fluid: 489 cu mm

## 2022-05-12 LAB — GLUCOSE, PLEURAL OR PERITONEAL FLUID: Glucose, Fluid: 133 mg/dL

## 2022-05-12 LAB — LACTATE DEHYDROGENASE, PLEURAL OR PERITONEAL FLUID: LD, Fluid: 174 U/L — ABNORMAL HIGH (ref 3–23)

## 2022-05-12 LAB — POTASSIUM: Potassium: 5.1 mmol/L (ref 3.5–5.1)

## 2022-05-12 LAB — LACTATE DEHYDROGENASE: LDH: 182 U/L (ref 98–192)

## 2022-05-12 MED ORDER — LIDOCAINE HCL (PF) 1 % IJ SOLN
15.0000 mL | Freq: Once | INTRAMUSCULAR | Status: AC
  Administered 2022-05-12: 15 mL via INTRADERMAL
  Filled 2022-05-12: qty 15

## 2022-05-12 MED ORDER — METOPROLOL TARTRATE 25 MG PO TABS
25.0000 mg | ORAL_TABLET | Freq: Two times a day (BID) | ORAL | Status: DC
  Administered 2022-05-12 – 2022-05-13 (×3): 25 mg via ORAL
  Filled 2022-05-12 (×3): qty 1

## 2022-05-12 MED ORDER — MIDODRINE HCL 5 MG PO TABS
2.5000 mg | ORAL_TABLET | Freq: Three times a day (TID) | ORAL | Status: DC
  Administered 2022-05-12 – 2022-05-13 (×5): 2.5 mg via ORAL
  Filled 2022-05-12 (×5): qty 1

## 2022-05-12 MED ORDER — MELATONIN 5 MG PO TABS
5.0000 mg | ORAL_TABLET | Freq: Every evening | ORAL | Status: DC | PRN
  Administered 2022-05-12: 5 mg via ORAL
  Filled 2022-05-12: qty 1

## 2022-05-12 MED ORDER — DILTIAZEM HCL ER COATED BEADS 120 MG PO CP24
120.0000 mg | ORAL_CAPSULE | Freq: Every day | ORAL | Status: DC
  Administered 2022-05-12 – 2022-05-14 (×3): 120 mg via ORAL
  Filled 2022-05-12 (×3): qty 1

## 2022-05-12 MED ORDER — LORAZEPAM 2 MG/ML IJ SOLN
0.5000 mg | Freq: Once | INTRAMUSCULAR | Status: AC | PRN
  Administered 2022-05-12: 0.5 mg via INTRAVENOUS
  Filled 2022-05-12: qty 1

## 2022-05-12 NOTE — Progress Notes (Signed)
OT Cancellation Note  Patient Details Name: Molly Mitchell MRN: [id number] DOB: 18-Jun-1945   Cancelled Treatment:    Reason Eval/Treat Not Completed: Other (comment). OT enters room and pt reports, "No therapy!" She does report feeling unwell today and plans for thoracentesis later in the day. OT will re-attempt when pt is able.  Jackquline Denmark, MS, OTR/L , CBIS ascom [phone]  05/12/22, 11:48 AM

## 2022-05-12 NOTE — Procedures (Signed)
PROCEDURE SUMMARY:  Successful US guided diagnostic and therapeutic left thoracentesis. Yielded 600 cc of clear, amber fluid. Pt tolerated procedure well. No immediate complications.  Specimen was sent for labs. CXR ordered.  EBL < 1 mL  Shon Hough, AGNP 05/12/2022 4:12 PM

## 2022-05-12 NOTE — Progress Notes (Signed)
PROGRESS NOTE    Molly Mitchell   ZOX:[id number] DOB: 16-Oct-1945  DOA: 05/14/2022 Date of Service: 05/12/22 PCP: [facility]     Brief Narrative / Hospital Course:   Molly Mitchell is a 77 y.o. female with medical history significant of A-fib on Eliquis, s/p of Watchman left atrial appendage closure device placement on 04/20/23, hypertension, hyperlipidemia, asthma, diastolic CHF, stroke, CKD-3A, chronic pain, carotid artery stenosis, who presents to the ED from home 05/04/2022 with cough, shortness of breath, chest pain x2 days. Patient recently had Watchman left atrial appendage closure device placement on 04/20/23.  04/17: atrial fibrillation with RVR, heart rate up to 137. Required cardizem drip in ED. Negative PCR for COVID, flu and RSV, WBC 13.7, lactic acid 1.3, procalcitonin 0.19. Urinalysis (cloudy appearance, small amount of leukocyte, negative nitrite, WBC 0-5 with squamous cell contamination 21-50), slightly worsening renal function, BNP 716, trop 6 --> 5. Temperature normal, blood pressure 102/74, RR 25-37, oxygen saturation 97% on room air. Pt is admitted PCU as inpt. Tx for Afib RVR and sepsis d/t CAP.  04/18: significantly anxious, became over sedated w/ valium, low BP, improved in afternoon. Echo reviewed from Watchman device placement - TEE 04/20/22 LVEF 55-60% normal LV and RV fxn, LA mod dilated. Cardiology to adjust rate/rhythm control --> d/c dilt gtt, start po diltiazem, change coreg to metoprolol 12.5 mg po q6h, started amiodarone bolus/gtt. Remained very anxious - alprazolam lower dose administered.  04/19:  converted to sinus rhythm about mid-day. Concern worsening infiltrate on CXR, add azithromycin. 04/20: amio gtt d/c overnight d/t hypotension/bradycardia.CXR personally reviewed - infiltrate worse on L appears more c/w edema/vascular congestion, trial another dose lasix 40 mg IV diuresis. RVR recurred, restarted amiodarone gtt lower dose since had po amio also, BP a  bit low so holding metoprolol po for now. Echo ordered.  04/21: Echo reviewed - EF 60-65%, indeterminate diastolic parameters, enlarged RV, moderately dilated LA and RA, small pericardial effusion w/o tamponade. BP soft. Maintain amio gtt, added midodrine rather than add fluids.  04/22: Overnight HR to 146, given IV metoprolol and converted to sinus but hypotensive, got IV fluids, amio gtt held. Converted to sinus. Somnolent but rousable, reduced/adjusted sedating medications.  04/23: remains in/out Afib RVR. Increased metoprolol frequency. Cardiology hesitant on DCCV - may need repeat imaging to r/o thrombus with recent Watchman placement  4/24-4/25: Remains dyspneic, likely chf from a-fib, diuresing   Consultants:  Cardiology    Procedures: None            ASSESSMENT & PLAN:   Principal Problem:   CAP (community acquired pneumonia) Active Problems:   Sepsis   Chronic diastolic CHF (congestive heart failure)   Chronic atrial fibrillation with RVR   Asthma   HTN (hypertension)   Chronic pain   HLD (hyperlipidemia)   Chronic kidney disease, stage 3a   Normocytic anemia     Sepsis due to CAP (community acquired pneumonia)  vs SIRS d/t Afib RVR and anxiety  Elevated HR and RR can be either/both etiology Met criteria with WBC 13.6, heart rate up to 137 and RR 25-37.  Lactic acid normal 1.3.  Procalcitonin 0.19.    Urine legionella and S. pneumococcal antigen - neg Treated as below    Community Acquired Pneumonia  S/p abx :IV Rocephin and doxycycline to cover PNA, changed doxy to azithromycin Mucinex for cough  Bronchodilators   Chronic atrial fibrillation with acute RVR Difficult to control initially now day 2 of NSR  Amiodarone gtt per cardiology d/c as of 01:00 05/08/22  PO amio 400mg  BID then on 5/2 200mg  daily thereafter.  Metop and dilt consolidated today by cardiology Eliquis 5 mg po bid --> continue eliquis 5mg  BID x 45 days following Watchman as per EP  Cardiology  following, appreciate assistance    Acute hypoxic respiratory failure  Pleural effusions Supplemental O2 as needed, currently on 3 liters, likely fluid mediated from a-fib, cr bump with diuresis, moderate effusions seen on cxr yesterday, IR consulted for thora diagnostic/therapeutic   Chronic diastolic CHF (congestive heart failure), w/ exacerbation  2D echo on 04/20/2022 showed EF of 55-60%. Bnp elevated I/os not well documented, have spoken to nursing about that Lasix on pause 2/2 aki today Thora as above   Severe anxiety Sensitivity to benzodiazepine (oversedation w/ Valium)  Scheduled BuSpar, declines ssri, requests benzo but explained these are relatively contraindicated and will give only as needed and will not rx at discharge   Nausea Controlled w/ current regimen   Asthma Bronchodilators   HTN (hypertension): IV hydralazine as needed Meds as above    Chronic pain Continue as needed Norco, reduced dose  Scheduled reduced dose tizanidine  As needed Tylenol   HLD (hyperlipidemia) Pravastatin   AKI on Chronic kidney disease, stage 3a:  Cr bump with lasix, which is on hold. Mild hyperkalemia also iatrogenic, improved on repeat   Normocytic anemia:  Hemoglobin 10.5 (12.1 on 03/22/2022), no active bleeding. monitor     DVT prophylaxis: Eliquis Pertinent IV fluids/nutrition: no continuous IV fluids at this time  Central lines / invasive devices: none   Code Status: FULL CODE ACP documents reviewed: none on file    Current Admission Status: inpatient, progressive unit d/t RVR difficult to control TOC needs / Dispo plan: Home w/ home health Barriers to discharge / significant pending items: remains hypoxic and dyspneic         Subjective / Brief ROS:  Main complaint is ongoing dyspnea and anxiety   Family Communication: husband and daughter updated @ bedside 4/26    Objective Findings:  Vitals:   05/11/22 1700 05/11/22 2000 05/11/22 2300 05/12/22 0400   BP: (!) 144/74 (!) 111/53 110/63 120/74  Pulse: 84 60 75 67  Resp: (!) 30 (!) 21 (!) 24 15  Temp:  98.1 F (36.7 C) 98 F (36.7 C) 98.1 F (36.7 C)  TempSrc:  Oral Oral Oral  SpO2: 98% 96% 97% 96%  Weight:    69.5 kg  Height:        Intake/Output Summary (Last 24 hours) at 05/12/2022 1114 Last data filed at 05/12/2022 0600 Gross per 24 hour  Intake 480 ml  Output 200 ml  Net 280 ml     Filed Weights   05/08/22 0500 05/11/22 0405 05/12/22 0400  Weight: 70.5 kg 69.4 kg 69.5 kg    Examination:  Anxious Mild tachypnea, shallow respirations RRR Abd soft Ext warm, no edema       Scheduled Medications:   amiodarone  400 mg Oral BID   Followed by   Melene Muller ON 05/18/2022] amiodarone  200 mg Oral Daily   apixaban  5 mg Oral BID   busPIRone  5 mg Oral TID   diltiazem  120 mg Oral Daily   feeding supplement  237 mL Oral BID AC   LORazepam  1 mg Oral Once   magnesium oxide  800 mg Oral Daily   metoprolol tartrate  25 mg Oral BID   midodrine  2.5 mg Oral TID WC   pravastatin  40 mg Oral QHS   tiZANidine  2 mg Oral TID    Continuous Infusions:  promethazine (PHENERGAN) injection (IM or IVPB) 6.25 mg (05/09/22 1821)     PRN Medications:  acetaminophen, albuterol, dextromethorphan-guaiFENesin, HYDROcodone-acetaminophen, hydrOXYzine, ondansetron (ZOFRAN) IV, prochlorperazine, promethazine (PHENERGAN) injection (IM or IVPB)  Antimicrobials from admission:  Anti-infectives (From admission, onward)    Start     Dose/Rate Route Frequency Ordered Stop   05/08/22 1415  azithromycin (ZITHROMAX) tablet 500 mg        500 mg Oral Daily 05/08/22 1324 05/09/22 0809   05/05/22 1600  azithromycin (ZITHROMAX) 500 mg in sodium chloride 0.9 % 250 mL IVPB  Status:  Discontinued        500 mg 250 mL/hr over 60 Minutes Intravenous Every 24 hours 05/05/22 1515 05/08/22 1324   04/24/2022 1900  doxycycline (VIBRA-TABS) tablet 100 mg  Status:  Discontinued        100 mg Oral Every 12 hours  04/29/2022 1129 05/07/22 1219   04/29/2022 1200  cefTRIAXone (ROCEPHIN) 2 g in sodium chloride 0.9 % 100 mL IVPB        2 g 200 mL/hr over 30 Minutes Intravenous Every 24 hours 05/01/2022 1129 05/09/22 1306   05/13/2022 0600  doxycycline (VIBRAMYCIN) 100 mg in sodium chloride 0.9 % 250 mL IVPB        100 mg 125 mL/hr over 120 Minutes Intravenous  Once 04/18/2022 0459 04/20/2022 0810           Data Reviewed:  I have personally reviewed the following...  CBC: Recent Labs  Lab 05/07/22 0520 05/10/22 0504  WBC 10.2 13.3*  HGB 9.7* 9.7*  HCT 30.7* 30.7*  MCV 91.6 92.5  PLT 407* 411*    Basic Metabolic Panel: Recent Labs  Lab 05/08/22 0441 05/09/22 0407 05/10/22 0504 05/11/22 0616 05/12/22 0400 05/12/22 0853  NA 136 138 137 141 140  --   K 3.8 3.9 4.2 3.9 5.4* 5.1  CL 101 100 101 101 99  --   CO2 25 27 25 30 31   --   GLUCOSE 117* 124* 91 125* 124*  --   BUN 36* 30* 29* 35* 48*  --   CREATININE 1.32* 1.24* 1.20* 1.11* 1.64*  --   CALCIUM 8.4* 8.8* 8.9 9.4 9.4  --   MG  --  2.0  --  1.8  --   --     GFR: Estimated Creatinine Clearance: 26.7 mL/min (A) (by C-G formula based on SCr of 1.64 mg/dL (H)). Liver Function Tests: Recent Labs  Lab 05/12/22 0400  AST 29  ALT 26  ALKPHOS 187*  BILITOT 0.4  PROT 6.7  ALBUMIN 3.1*    No results for input(s): "LIPASE", "AMYLASE" in the last 168 hours.  No results for input(s): "AMMONIA" in the last 168 hours. Coagulation Profile: No results for input(s): "INR", "PROTIME" in the last 168 hours. Cardiac Enzymes: No results for input(s): "CKTOTAL", "CKMB", "CKMBINDEX", "TROPONINI" in the last 168 hours. BNP (last 3 results) No results for input(s): "PROBNP" in the last 8760 hours. HbA1C: No results for input(s): "HGBA1C" in the last 72 hours. CBG: No results for input(s): "GLUCAP" in the last 168 hours. Lipid Profile: No results for input(s): "CHOL", "HDL", "LDLCALC", "TRIG", "CHOLHDL", "LDLDIRECT" in the last 72  hours. Thyroid Function Tests: No results for input(s): "TSH", "T4TOTAL", "FREET4", "T3FREE", "THYROIDAB" in the last 72 hours.  Anemia Panel: No results for  input(s): "VITAMINB12", "FOLATE", "FERRITIN", "TIBC", "IRON", "RETICCTPCT" in the last 72 hours. Most Recent Urinalysis On File:     Component Value Date/Time   COLORURINE YELLOW (A) 05/09/2022 0811   APPEARANCEUR CLOUDY (A) 05/12/2022 0811   APPEARANCEUR Clear 02/19/2013 0916   LABSPEC 1.018 04/25/2022 0811   LABSPEC 1.010 02/19/2013 0916   PHURINE 5.0 05/13/2022 0811   GLUCOSEU NEGATIVE 05/14/2022 0811   GLUCOSEU Negative 02/19/2013 0916   HGBUR NEGATIVE 04/18/2022 0811   BILIRUBINUR NEGATIVE 05/11/2022 0811   BILIRUBINUR negative 10/23/2017 1324   BILIRUBINUR Negative 02/19/2013 0916   KETONESUR NEGATIVE 05/05/2022 0811   PROTEINUR 30 (A) 05/14/2022 0811   UROBILINOGEN 0.2 10/23/2017 1324   NITRITE NEGATIVE 05/11/2022 0811   LEUKOCYTESUR SMALL (A) 05/07/2022 0811   LEUKOCYTESUR Negative 02/19/2013 0916   Sepsis Labs: @LABRCNTIP (procalcitonin:4,lacticidven:4) Microbiology: Recent Results (from the past 240 hour(s))  Culture, blood (routine x 2)     Status: None   Collection Time: 04/21/2022  5:37 AM   Specimen: BLOOD RIGHT ARM  Result Value Ref Range Status   Specimen Description BLOOD RIGHT ARM  Final   Special Requests   Final    BOTTLES DRAWN AEROBIC AND ANAEROBIC Blood Culture adequate volume   Culture   Final    NO GROWTH 5 DAYS Performed at [facility], [address] [place], [place] [address]    Report Status 05/08/2022 FINAL  Final  Resp panel by RT-PCR (RSV, Flu A&B, Covid) Anterior Nasal Swab     Status: None   Collection Time: 04/28/2022  5:37 AM   Specimen: Anterior Nasal Swab  Result Value Ref Range Status   SARS Coronavirus 2 by RT PCR NEGATIVE NEGATIVE Final    Comment: (NOTE) SARS-CoV-2 target nucleic acids are NOT DETECTED.  The SARS-CoV-2 RNA is generally detectable in upper  respiratory specimens during the acute phase of infection. The lowest concentration of SARS-CoV-2 viral copies this assay can detect is 138 copies/mL. A negative result does not preclude SARS-Cov-2 infection and should not be used as the sole basis for treatment or other patient management decisions. A negative result may occur with  improper specimen collection/handling, submission of specimen other than nasopharyngeal swab, presence of viral mutation(s) within the areas targeted by this assay, and inadequate number of viral copies(<138 copies/mL). A negative result must be combined with clinical observations, patient history, and epidemiological information. The expected result is Negative.  Fact Sheet for Patients:  BloggerCourse.com  Fact Sheet for Healthcare Providers:  SeriousBroker.it  This test is no t yet approved or cleared by the [place] FDA and  has been authorized for detection and/or diagnosis of SARS-CoV-2 by FDA under an Emergency Use Authorization (EUA). This EUA will remain  in effect (meaning this test can be used) for the duration of the COVID-19 declaration under Section 564(b)(1) of the Act, 21 U.S.C.section 360bbb-3(b)(1), unless the authorization is terminated  or revoked sooner.       Influenza A by PCR NEGATIVE NEGATIVE Final   Influenza B by PCR NEGATIVE NEGATIVE Final    Comment: (NOTE) The Xpert Xpress SARS-CoV-2/FLU/RSV plus assay is intended as an aid in the diagnosis of influenza from Nasopharyngeal swab specimens and should not be used as a sole basis for treatment. Nasal washings and aspirates are unacceptable for Xpert Xpress SARS-CoV-2/FLU/RSV testing.  Fact Sheet for Patients: BloggerCourse.com  Fact Sheet for Healthcare Providers: SeriousBroker.it  This test is not yet approved or cleared by the [place] FDA and has been  authorized for detection and/or diagnosis of SARS-CoV-2 by FDA under an Emergency Use Authorization (EUA). This EUA will remain in effect (meaning this test can be used) for the duration of the COVID-19 declaration under Section 564(b)(1) of the Act, 21 U.S.C. section 360bbb-3(b)(1), unless the authorization is terminated or revoked.     Resp Syncytial Virus by PCR NEGATIVE NEGATIVE Final    Comment: (NOTE) Fact Sheet for Patients: BloggerCourse.com  Fact Sheet for Healthcare Providers: SeriousBroker.it  This test is not yet approved or cleared by the [place] FDA and has been authorized for detection and/or diagnosis of SARS-CoV-2 by FDA under an Emergency Use Authorization (EUA). This EUA will remain in effect (meaning this test can be used) for the duration of the COVID-19 declaration under Section 564(b)(1) of the Act, 21 U.S.C. section 360bbb-3(b)(1), unless the authorization is terminated or revoked.  Performed at [facility], [address] [place], [place] [address]   Culture, blood (routine x 2)     Status: None   Collection Time: 05/04/22  2:54 PM   Specimen: BLOOD  Result Value Ref Range Status   Specimen Description BLOOD BLOOD LEFT HAND  Final   Special Requests   Final    BOTTLES DRAWN AEROBIC AND ANAEROBIC Blood Culture adequate volume   Culture   Final    NO GROWTH 5 DAYS Performed at [facility], [address], [place], [place] [address]    Report Status 05/09/2022 FINAL  Final      Radiology Studies last 3 days: DG Chest Port 1 View  Result Date: 05/11/2022 CLINICAL DATA:  Dyspnea EXAM: PORTABLE CHEST 1 VIEW COMPARISON:  X-ray 05/06/2022 and older FINDINGS: Enlarged cardiopericardial silhouette with calcified tortuous aorta. Intra-atrial appendage occlusion device. Extensive hardware along the spine. Enlarged heart with increasing left effusion and opacity. Trace right  effusion. No pneumothorax. Overlapping cardiac leads IMPRESSION: Increasing left-sided pleural effusion and opacity. Recommend follow-up Electronically Signed   By: Karen Kays M.D.   On: 05/11/2022 15:24             LOS: 9 days       Silvano Bilis, MD [facility] 05/12/2022, 11:14 AM    Staff may message me via secure chat in Epic  but this may not receive an immediate response,  please page me for urgent matters!  If 7PM-7AM, please contact night coverage [email or web address]

## 2022-05-12 NOTE — Progress Notes (Signed)
PT Cancellation Note  Patient Details Name: Molly Mitchell MRN: [id number] DOB: May 06, 1945   Cancelled Treatment:    Reason Eval/Treat Not Completed: Patient at procedure or test/unavailable.  Pt currently not in her room (not available for PT treatment).  Per chart plan for thoracentesis today.  Will re-attempt PT session at a later date/time.  Hendricks Limes, PT 05/12/22, 3:03 PM

## 2022-05-12 NOTE — Progress Notes (Signed)
[facility] CLINIC CARDIOLOGY CONSULT NOTE       Patient ID: Molly Mitchell MRN: [id number] DOB/AGE: 07-13-1945 77 y.o.  Admit date: 05/10/2022 Referring Physician Dr. Sunnie Nielsen  Primary Physician Dr. Nemiah Commander Primary Cardiologist Minda Ditto, PA-C  Reason for Consultation AF RVR  HPI: Molly Mitchell is a 77yoF with a PMH of paroxysmal AF s/p recent Watchman (LAAO closure) placement 4/4, HFpEF, right CEA (09/2021), s/p spinal fusion who presented to [facility] ED 04/25/2022 with cough, shortness of breath, and chest pain for 2 days. Completed Abx course for CAP, with overlapping acute on chronic HFpEF - decompensation likely 2/2 atrial fibrillation with RVR. Intermittent paroxysms of AF RVR throughout admission, in NSR since midday 4/25.   Interval History:  - in NSR for ~24hrs, rate stable in 70s-80s.  - repeat CXR with increasing L pleural effusion despite increased lasix yesterday PM (with resultant bump in Cr). Plan for thora today - main complaint is dyspnea and severe anxiety.  - discussed new medications in detail with patient, daughter, and husband at bedside    Past Medical History:  Diagnosis Date   Acute right MCA stroke (HCC) 08/01/2021   a.) noted on MRI head 08/01/2021 --> punctate acute RIGHT MCA territory infarcts affecting the frontal and parietal contex consistent with microembolic infarcts in the RIGHT carotid circulation   Anemia    Arthritis    Bilateral carotid artery disease (HCC) 08/18/2021   a.) CTA neck 08/18/2021: 70-80% RICA and 40% LICA   Cervicalgia    Chronic kidney disease    Chronic pain syndrome    Chronic, continuous use of opioids    a.) hydrocodone/APAP (Norco 10/325 mg)   Connective tissue disease (HCC)    pt reports being diagnosed with undifferentiated connective tissue disease at some point- pt took steroids for this and has had no issues since- 10 years ago   Coronary artery disease    Cortical senile cataract    COVID 2021   DDD  (degenerative disc disease), cervical    a.) s/p C4-C6 ACDF   DDD (degenerative disc disease), lumbar    a.) s/p laminectomy + posterior L2-S1 fusion   Dysrhythmia    A-fib   History of 2019 novel coronavirus disease (COVID-19) 12/07/2018   History of blood transfusion    History of heart murmur in childhood    History of rheumatic fever    HLD (hyperlipidemia)    Hypertension    Lower extremity weakness    a.) with position changes from sitting to standing   Osteoporosis    Presence of Watchman left atrial appendage closure device 04/20/2022   27mm Watchman FLX placed by Dr. Lalla Brothers   Pseudogout    Shingles    Skin cancer of face    Spondylolisthesis of lumbar region     Past Surgical History:  Procedure Laterality Date   ABDOMINAL HYSTERECTOMY  1981   AIKEN OSTEOTOMY Right 01/30/2018   Procedure: WEIL RIGHT 2ND AND 3RD;  Surgeon: Gwyneth Revels, DPM;  Location: [facility];  Service: Podiatry;  Laterality: Right;   ANTERIOR CERVICAL DECOMP/DISCECTOMY FUSION N/A 2003   C4-C6   BACK SURGERY     pt reports hx of 3 lumber back surgeries total   CERVICAL SPINE SURGERY     pt reports having 3 cervical fusions total   ENDARTERECTOMY Right 09/21/2021   Procedure: ENDARTERECTOMY CAROTID;  Surgeon: Renford Dills, MD;  Location: [facility];  Service: Vascular;  Laterality: Right;  Right carotid  HAMMER TOE SURGERY Right 01/30/2018   Procedure: HAMMER TOE CORRECTION SECOND AND THIRD;  Surgeon: Gwyneth Revels, DPM;  Location: [facility];  Service: Podiatry;  Laterality: Right;  GENERAL WITH LOCAL   HAMMER TOE SURGERY Left 03/05/2019   Procedure: HAMMER TOE CORRECTION T1 AND T2;  Surgeon: Gwyneth Revels, DPM;  Location: [facility];  Service: Podiatry;  Laterality: Left;   LEFT ATRIAL APPENDAGE OCCLUSION N/A 04/20/2022   Procedure: LEFT ATRIAL APPENDAGE OCCLUSION;  Surgeon: Lanier Prude, MD;  Location: [facility];  Service: Cardiovascular;   Laterality: N/A;   POSTERIOR LUMBAR FUSION N/A 2001   L2-S1   ROTATOR CUFF REPAIR Right 1981   SACROPLASTY N/A 03/29/2017   S1   TEE WITHOUT CARDIOVERSION N/A 04/20/2022   Procedure: TRANSESOPHAGEAL ECHOCARDIOGRAM;  Surgeon: Lanier Prude, MD;  Location: [facility] INVASIVE CV LAB;  Service: Cardiovascular;  Laterality: N/A;   TOTAL KNEE ARTHROPLASTY Left 03/03/2013   TOTAL KNEE REVISION Left 07/31/2016   Procedure: TOTAL KNEE REVISION;  Surgeon: Donato Heinz, MD;  Location: [facility];  Service: Orthopedics;  Laterality: Left;   WEIL OSTEOTOMY Left 03/05/2019   Procedure: WEIL OSTEOTOMY X 2 LEFT;  Surgeon: Gwyneth Revels, DPM;  Location: [facility];  Service: Podiatry;  Laterality: Left;    Medications Prior to Admission  Medication Sig Dispense Refill Last Dose   amLODipine (NORVASC) 2.5 MG tablet Take 2.5-5 mg by mouth See admin instructions. Take 2.5 mg in the morning and 5 mg at bedtime   Past Week   amoxicillin (AMOXIL) 500 MG capsule Take 4 tablets by mouth 1 hour prior to dental procedures. 12 capsule 3 unknown   apixaban (ELIQUIS) 5 MG TABS tablet Take 1 tablet (5 mg total) by mouth 2 (two) times daily. 60 tablet  05/02/2022   carvedilol (COREG) 3.125 MG tablet Take 3.125 mg by mouth 2 (two) times daily with a meal.   Past Week   HYDROcodone-acetaminophen (NORCO/VICODIN) 5-325 MG tablet Take 2 tablets by mouth 4 (four) times daily.   Past Week   pravastatin (PRAVACHOL) 40 MG tablet Take 40 mg by mouth at bedtime.   05/02/2022   telmisartan (MICARDIS) 80 MG tablet Take 80 mg by mouth daily.   Past Week   tiZANidine (ZANAFLEX) 4 MG tablet Take 1 tablet (4 mg total) by mouth 2 (two) times daily.   05/02/2022    Social History   Socioeconomic History   Marital status: Married    Spouse name: [name]   Number of children: 2   Years of education: Not on file   Highest education level: High school graduate  Occupational History   Occupation: retired   Occupation: Retired [employer]  Tobacco Use   Smoking status: Former    Packs/day: 1.00    Years: 15.00    Additional pack years: 0.00    Total pack years: 15.00    Types: Cigarettes    Quit date: 04/17/1979    Years since quitting: 43.0   Smokeless tobacco: Never  Vaping Use   Vaping Use: Never used  Substance and Sexual Activity   Alcohol use: No    Alcohol/week: 0.0 standard drinks of alcohol   Drug use: No   Sexual activity: Not on file  Other Topics Concern   Not on file  Social History Narrative   Not on file   Social Determinants of Health   Financial Resource Strain: Low Risk  (03/23/2020)   Overall  Financial Resource Strain (CARDIA)    Difficulty of Paying Living Expenses: Not hard at all  Food Insecurity: No Food Insecurity (05/04/2022)   Hunger Vital Sign    Worried About Running Out of Food in the Last Year: Never true    Ran Out of Food in the Last Year: Never true  Transportation Needs: No Transportation Needs (05/04/2022)   PRAPARE - Administrator, Civil Service (Medical): No    Lack of Transportation (Non-Medical): No  Physical Activity: Sufficiently Active (03/23/2020)   Exercise Vital Sign    Days of Exercise per Week: 3 days    Minutes of Exercise per Session: 60 min  Stress: No Stress Concern Present (03/23/2020)   [employer] of Occupational Health - Occupational Stress Questionnaire    Feeling of Stress : Not at all  Social Connections: Socially Integrated (03/23/2020)   Social Connection and Isolation Panel [NHANES]    Frequency of Communication with Friends and Family: Never    Frequency of Social Gatherings with Friends and Family: More than three times a week    Attends Religious Services: More than 4 times per year    Active Member of Clubs or Organizations: Yes    Attends Banker Meetings: More than 4 times per year    Marital Status: Married  Catering manager Violence: Not At Risk (05/04/2022)   Humiliation, Afraid, Rape,  and Kick questionnaire    Fear of Current or Ex-Partner: No    Emotionally Abused: No    Physically Abused: No    Sexually Abused: No    Family History  Problem Relation Age of Onset   Hypertension Brother    Hypertension Mother    Hypertension Sister    Cancer Sister    Diabetes Paternal Grandmother    Heart failure Maternal Grandmother       Intake/Output Summary (Last 24 hours) at 05/12/2022 6962 Last data filed at 05/12/2022 0600 Gross per 24 hour  Intake 480 ml  Output 200 ml  Net 280 ml     Vitals:   05/11/22 1700 05/11/22 2000 05/11/22 2300 05/12/22 0400  BP: (!) 144/74 (!) 111/53 110/63 120/74  Pulse: 84 60 75 67  Resp: (!) 30 (!) 21 (!) 24 15  Temp:  98.1 F (36.7 C) 98 F (36.7 C) 98.1 F (36.7 C)  TempSrc:  Oral Oral Oral  SpO2: 98% 96% 97% 96%  Weight:    69.5 kg  Height:        PHYSICAL EXAM General: elderly caucasian female, laying at low incline in bed, daughter and husband at bedside HEENT:  Normocephalic and atraumatic. Neck:  No JVD.  Lungs: Normal respiratory effort on O2 by [place]. Decreased breath sounds left base, trace R crackles Heart: regular rate and rhythm. Normal S1 and S2 without gallops or murmurs.  Abdomen: Non-distended appearing.  Msk: Normal strength and tone for age. Extremities: Warm and well perfused. No clubbing, cyanosis. Trace bilateral LE edema.  Neuro: alert and oriented  Psych:  anxious mood  Labs: Basic Metabolic Panel: Recent Labs    05/11/22 0616 05/12/22 0400  NA 141 140  K 3.9 5.4*  CL 101 99  CO2 30 31  GLUCOSE 125* 124*  BUN 35* 48*  CREATININE 1.11* 1.64*  CALCIUM 9.4 9.4  MG 1.8  --     Liver Function Tests: Recent Labs    05/12/22 0400  AST 29  ALT 26  ALKPHOS 187*  BILITOT 0.4  PROT  6.7  ALBUMIN 3.1*    No results for input(s): "LIPASE", "AMYLASE" in the last 72 hours.  CBC: Recent Labs    05/10/22 0504  WBC 13.3*  HGB 9.7*  HCT 30.7*  MCV 92.5  PLT 411*    Cardiac  Enzymes: No results for input(s): "CKTOTAL", "CKMB", "CKMBINDEX", "TROPONINIHS" in the last 72 hours.  BNP: Recent Labs    05/10/22 0504  BNP 698.5*    D-Dimer: No results for input(s): "DDIMER" in the last 72 hours. Hemoglobin A1C: No results for input(s): "HGBA1C" in the last 72 hours. Fasting Lipid Panel: No results for input(s): "CHOL", "HDL", "LDLCALC", "TRIG", "CHOLHDL", "LDLDIRECT" in the last 72 hours. Thyroid Function Tests: No results for input(s): "TSH", "T4TOTAL", "T3FREE", "THYROIDAB" in the last 72 hours.  Invalid input(s): "FREET3"  Anemia Panel: No results for input(s): "VITAMINB12", "FOLATE", "FERRITIN", "TIBC", "IRON", "RETICCTPCT" in the last 72 hours.   Radiology: [facility] Chest Port 1 View  Result Date: 05/11/2022 CLINICAL DATA:  Dyspnea EXAM: PORTABLE CHEST 1 VIEW COMPARISON:  X-ray 05/06/2022 and older FINDINGS: Enlarged cardiopericardial silhouette with calcified tortuous aorta. Intra-atrial appendage occlusion device. Extensive hardware along the spine. Enlarged heart with increasing left effusion and opacity. Trace right effusion. No pneumothorax. Overlapping cardiac leads IMPRESSION: Increasing left-sided pleural effusion and opacity. Recommend follow-up Electronically Signed   By: Karen Kays M.D.   On: 05/11/2022 15:24   ECHOCARDIOGRAM COMPLETE  Result Date: 05/06/2022    ECHOCARDIOGRAM REPORT   Patient Name:   Molly Mitchell Date of Exam: 05/06/2022 Medical Rec #:  [id number]      Height:       62.0 in Accession #:    [id number]     Weight:       149.9 lb Date of Birth:  February 26, 1945       BSA:          1.691 m Patient Age:    77 years       BP:           95/65 mmHg Patient Gender: F              HR:           107 bpm. Exam Location:  [facility] Procedure: 2D Echo Indications:     Atrial Fibrillation I48.91  History:         Patient has prior history of Echocardiogram examinations, most                  recent 04/20/2022.  Sonographer:     Overton Mam RDCS, FASE  Referring Phys:  Lajuana Carry A KHAN Diagnosing Phys: Adrian Blackwater  Sonographer Comments: Technically difficult study due to poor echo windows. Image acquisition challenging due to respiratory motion. The patient had coughing spells throughout this study. IMPRESSIONS  1. Left ventricular ejection fraction, by estimation, is 60 to 65%. The left ventricle has normal function. The left ventricle has no regional wall motion abnormalities. There is mild concentric left ventricular hypertrophy. Left ventricular diastolic parameters are indeterminate.  2. Right ventricular systolic function is normal. The right ventricular size is moderately enlarged.  3. Left atrial size was moderately dilated.  4. Right atrial size was moderately dilated.  5. A small pericardial effusion is present. The pericardial effusion is circumferential. There is no evidence of cardiac tamponade.  6. The mitral valve is normal in structure. Mild mitral valve regurgitation. No evidence of mitral stenosis.  7. The aortic valve is normal in structure. Aortic  valve regurgitation is not visualized. No aortic stenosis is present.  8. The inferior vena cava is normal in size with greater than 50% respiratory variability, suggesting right atrial pressure of 3 mmHg. FINDINGS  Left Ventricle: Left ventricular ejection fraction, by estimation, is 60 to 65%. The left ventricle has normal function. The left ventricle has no regional wall motion abnormalities. The left ventricular internal cavity size was normal in size. There is  mild concentric left ventricular hypertrophy. Left ventricular diastolic parameters are indeterminate. Right Ventricle: The right ventricular size is moderately enlarged. No increase in right ventricular wall thickness. Right ventricular systolic function is normal. Left Atrium: Left atrial size was moderately dilated. Right Atrium: Right atrial size was moderately dilated. Pericardium: A small pericardial effusion is present. The  pericardial effusion is circumferential. There is no evidence of cardiac tamponade. Mitral Valve: The mitral valve is normal in structure. Mild mitral valve regurgitation. No evidence of mitral valve stenosis. Tricuspid Valve: The tricuspid valve is normal in structure. Tricuspid valve regurgitation is mild . No evidence of tricuspid stenosis. Aortic Valve: The aortic valve is normal in structure. Aortic valve regurgitation is not visualized. No aortic stenosis is present. Aortic valve peak gradient measures 10.9 mmHg. Pulmonic Valve: The pulmonic valve was normal in structure. Pulmonic valve regurgitation is not visualized. No evidence of pulmonic stenosis. Aorta: The aortic root is normal in size and structure. Venous: The inferior vena cava is normal in size with greater than 50% respiratory variability, suggesting right atrial pressure of 3 mmHg. IAS/Shunts: No atrial level shunt detected by color flow Doppler.  LEFT VENTRICLE PLAX 2D LVIDd:         3.80 cm   Diastology LVIDs:         2.70 cm   LV e' medial:    13.40 cm/s LV PW:         1.00 cm   LV E/e' medial:  6.7 LV IVS:        1.00 cm   LV e' lateral:   8.27 cm/s LVOT diam:     1.80 cm   LV E/e' lateral: 10.8 LV SV:         58 LV SV Index:   34 LVOT Area:     2.54 cm  RIGHT VENTRICLE RV Basal diam:  2.80 cm RV S prime:     12.10 cm/s TAPSE (M-mode): 1.0 cm LEFT ATRIUM           Index        RIGHT ATRIUM           Index LA diam:      3.60 cm 2.13 cm/m   RA Area:     16.20 cm LA Vol (A4C): 30.2 ml 17.86 ml/m  RA Volume:   45.30 ml  26.78 ml/m  AORTIC VALVE                 PULMONIC VALVE AV Area (Vmax): 1.77 cm     PV Vmax:        1.04 m/s AV Vmax:        165.00 cm/s  PV Peak grad:   4.3 mmHg AV Peak Grad:   10.9 mmHg    RVOT Peak grad: 3 mmHg LVOT Vmax:      115.00 cm/s LVOT Vmean:     75.800 cm/s LVOT VTI:       0.226 m  AORTA Ao Root diam: 3.00 cm Ao Asc diam:  3.20 cm MITRAL VALVE  TRICUSPID VALVE MV Area (PHT): 2.94 cm    TR Peak grad:    22.3 mmHg MV Decel Time: 258 msec    TR Vmax:        236.00 cm/s MV E velocity: 89.60 cm/s                            SHUNTS                            Systemic VTI:  0.23 m                            Systemic Diam: 1.80 cm Adrian Blackwater Electronically signed by Adrian Blackwater Signature Date/Time: 05/06/2022/11:04:36 PM    Final    DG Chest Port 1 View  Result Date: 05/06/2022 CLINICAL DATA:  Shortness of breath EXAM: PORTABLE CHEST 1 VIEW COMPARISON:  05/05/2018 FINDINGS: Cardiac shadow is enlarged but stable. Watchman device is again seen. Postsurgical changes in the thoracolumbar spine are noted. Slight increased central vascular congestion is noted with mild interstitial edema. Small effusions are noted bilaterally. IMPRESSION: Slight increase in vascular congestion with mild interstitial edema. Small effusions are again noted. Electronically Signed   By: Alcide Clever M.D.   On: 05/06/2022 09:29   DG Chest Port 1 View  Result Date: 05/05/2022 CLINICAL DATA:  Shortness of breath. EXAM: PORTABLE CHEST 1 VIEW COMPARISON:  04/29/2022. FINDINGS: Increasing bibasilar opacities, left-greater-than-right, suspicious for aspiration or multifocal pneumonia. Unchanged small left pleural effusion. Stable cardiac and mediastinal contours. No pneumothorax. IMPRESSION: Increasing bibasilar opacities, greater on the left, suspicious for aspiration or multifocal pneumonia. Electronically Signed   By: Orvan Falconer M.D.   On: 05/05/2022 13:20   DG Chest Port 1 View  Result Date: 05/02/2022 CLINICAL DATA:  Chest pain EXAM: PORTABLE CHEST 1 VIEW COMPARISON:  04/20/2022 FINDINGS: Mild bilateral lower lobe opacities, left greater than right, atelectasis versus pneumonia. Small left pleural effusion, new. No frank interstitial edema. Cardiomegaly. Thoracolumbar spine fixation hardware. Cervical spine fixation hardware, incompletely visualized. IMPRESSION: Mild bilateral lower lobe opacities, left greater than right,  atelectasis versus pneumonia. Small left pleural effusion. Electronically Signed   By: Charline Bills M.D.   On: 04/20/2022 03:37   DG Chest 2 View  Result Date: 04/22/2022 CLINICAL DATA:  77 year old female, preoperative evaluation. EXAM: CHEST - 2 VIEW COMPARISON:  10/13/2013, 01/30/2022 FINDINGS: The mediastinal contours are within normal limits. No cardiomegaly. The lungs are clear bilaterally without evidence of focal consolidation, pleural effusion, or pneumothorax. Multilevel, incompletely visualized thoracolumbar fusion hardware in place. Lower cervical ACDF hardware in place. No acute osseous abnormality. IMPRESSION: No acute cardiopulmonary process. Electronically Signed   By: Marliss Coots M.D.   On: 04/22/2022 11:34   EP STUDY  Result Date: 04/20/2022 CONCLUSIONS: 1.Successful implantation of a WATCHMAN left atrial appendage occlusive device   2. TEE demonstrating no LAA thrombus 3. No early apparent complications. Post Implant Anticoagulation Strategy: Continue Eliquis 5mg  PO BID x 45 days after implant. After 45 days, stop Eliquis and start Plavix 75mg  PO daily to complete 6 months of post implant therapy. Plan for CT scan 60 days after implant to assess Watchman position.   ECHO TEE  Result Date: 04/20/2022    TRANSESOPHOGEAL ECHO REPORT   Patient Name:   Molly Mitchell Date of Exam: 04/20/2022 Medical Rec #:  [id number]  Height:       62.0 in Accession #:    [id number]     Weight:       145.0 lb Date of Birth:  07-16-1945       BSA:          1.667 m Patient Age:    77 years       BP:           158/56 mmHg Patient Gender: F              HR:           76 bpm. Exam Location:  Inpatient Procedure: Transesophageal Echo, 3D Echo, Color Doppler and Cardiac Doppler Indications:     I48.2 Chronic atrial fibrillation  History:         Patient has prior history of Echocardiogram examinations, most                  recent 09/20/2021. Arrythmias:Atrial Fibrillation; Risk                   Factors:Hypertension and Dyslipidemia.  Sonographer:     Irving Burton Senior RDCS Referring Phys:  [id number] Lanier Prude Diagnosing Phys: Riley Lam MD  Sonographer Comments: 27mm Watchman FLX Implanted PROCEDURE: After discussion of the risks and benefits of a TEE, an informed consent was obtained from the patient. The transesophogeal probe was passed without difficulty through the esophogus of the patient. Sedation performed by different physician. The patient was monitored while under deep sedation. The patient developed no complications during the procedure.  IMPRESSIONS  1. Prior to procedure, patent left atrial appendage. Maximal diameter 2.19 cm (ostial measurements). Sufficient sizing for a 27 mm Watchman FLX device.  2. A mid-posterior transeptal puncture was performed.  3. a 27 mm Watchman FLX was deployed. Greater than 1/2 shoulder seen. Small tissue compression seen at the anterior mitral valve annulus. No thrombus seen. Device recaptured.  4. At redeployment a 27 mm Watchman FLX was present. No peri-device leak. No thrombus. A 1/3-1/2 mitral shoulder was noted in the 135 view. Average compression ~ 15%.  5. No significant pericardial effusion seen throughout study.  6. Iatrogenic ASD present. Majority of flow was left to right, rare right to left shunting.  7. Left ventricular ejection fraction, by estimation, is 55 to 60%. The left ventricle has normal function.  8. Right ventricular systolic function is normal. The right ventricular size is normal.  9. Left atrial size was moderately dilated. No left atrial/left atrial appendage thrombus was detected. 10. Right atrial size was mildly dilated. 11. The mitral valve is normal in structure. Mild mitral valve regurgitation. 12. The aortic valve is tricuspid. Aortic valve regurgitation is not visualized. No aortic stenosis is present. 13. Evidence of atrial level shunting detected by color flow Doppler. FINDINGS  Left Ventricle: Left ventricular  ejection fraction, by estimation, is 55 to 60%. The left ventricle has normal function. The left ventricular internal cavity size was normal in size. Right Ventricle: The right ventricular size is normal. Right vetricular wall thickness was not well visualized. Right ventricular systolic function is normal. Left Atrium: Left atrial size was moderately dilated. No left atrial/left atrial appendage thrombus was detected. Right Atrium: Right atrial size was mildly dilated. Pericardium: There is no evidence of pericardial effusion. Mitral Valve: The mitral valve is normal in structure. Mild mitral valve regurgitation. Tricuspid Valve: The tricuspid valve is normal in structure. Tricuspid valve regurgitation is trivial. No evidence of  tricuspid stenosis. Aortic Valve: The aortic valve is tricuspid. Aortic valve regurgitation is not visualized. No aortic stenosis is present. Pulmonic Valve: The pulmonic valve was normal in structure. Pulmonic valve regurgitation is not visualized. No evidence of pulmonic stenosis. Aorta: The aortic root, ascending aorta, aortic arch and descending aorta are all structurally normal, with no evidence of dilitation or obstruction. IAS/Shunts: Evidence of atrial level shunting detected by color flow Doppler. Additional Comments: Spectral Doppler performed. Riley Lam MD Electronically signed by Riley Lam MD Signature Date/Time: 04/20/2022/10:50:03 AM    Final     ECHO - TEE A04/04/2022  1. Prior to procedure, patent left atrial appendage. Maximal diameter  2.19 cm (ostial measurements). Sufficient sizing for a 27 mm Watchman FLX  device.   2. A mid-posterior transeptal puncture was performed.   3. a 27 mm Watchman FLX was deployed. Greater than 1/2 shoulder seen.  Small tissue compression seen at the anterior mitral valve annulus. No  thrombus seen. Device recaptured.   4. At redeployment a 27 mm Watchman FLX was present. No peri-device leak.  No thrombus. A  1/3-1/2 mitral shoulder was noted in the 135 view. Average  compression ~ 15%.   5. No significant pericardial effusion seen throughout study.   6. Iatrogenic ASD present. Majority of flow was left to right, rare right  to left shunting.   7. Left ventricular ejection fraction, by estimation, is 55 to 60%. The  left ventricle has normal function.   8. Right ventricular systolic function is normal. The right ventricular  size is normal.   9. Left atrial size was moderately dilated. No left atrial/left atrial  appendage thrombus was detected.  10. Right atrial size was mildly dilated.  11. The mitral valve is normal in structure. Mild mitral valve  regurgitation.  12. The aortic valve is tricuspid. Aortic valve regurgitation is not  visualized. No aortic stenosis is present.  13. Evidence of atrial level shunting detected by color flow Doppler.    TELEMETRY reviewed by me (LT) 05/12/2022 : in NSR since 11:15a on 4/25  EKG reviewed by me: NSR 74 nonspecific TW abnormality   Data reviewed by me (LT) 05/12/2022:  hospitalist progress note, nursing notes last 24h vitals tele labs imaging I/O    Principal Problem:   Chronic atrial fibrillation with RVR (HCC) Active Problems:   HLD (hyperlipidemia)   HTN (hypertension)   Chronic diastolic CHF (congestive heart failure) (HCC)   CAP (community acquired pneumonia)   Chronic kidney disease, stage 3a (HCC)   Chronic pain   Asthma   Normocytic anemia   Sepsis (HCC)    ASSESSMENT AND PLAN:  Molly Mitchell is a 89yoF with a PMH of paroxysmal AF s/p recent Watchman (LAAO closure) placement 4/4, HFpEF, right CEA (09/2021), s/p spinal fusion who presented to [facility] ED 04/20/2022 with cough, shortness of breath, and chest pain for 2 days. Completed Abx course for CAP, with overlapping acute on chronic HFpEF - decompensation likely 2/2 atrial fibrillation with RVR. Intermittent paroxysms of AF RVR throughout admission, in NSR since midday 4/25.   #  paroxysmal AF RVR # s/p Watchman LAAO closure device 04/20/2022 Presents with shortness of breath and chest pain x 2 days, in AF RVR with difficult to control HR. Very symptomatic with anxiety.  - s/p diltiazem infusion with borderline low BP - s/p IV amiodarone bolus and multiple days of IV infusion.  -Continue PO amio to 400mg  BID until 5/2, then 200mg  daily thereafter. -  consolidate metoprolol tartrate 25mg  to BID  - consolidate cardizem to CD dosing 120mg  daily - continue eliquis 5mg  BID x 45 days following Watchman, then plavix 75mg  daily x 6 months per EP. Follow up with EP (Dr. Lalla Brothers) for repeat CT as currently scheduled.  - baseline LFTs: AST/ALT 20/32 respectively  - TSH 1.5, free T4 1.12 - careful dosing of anxiolytics as patient became very sedated on valium. Offered to start SSRI for anxiety, patient declined. Continue buspar.   # acute hypoxic respiratory failure  # CAP # acute on chronic HFpEF  BNP elevated at 700. Not grossly volume up clinically, but repeat Cxr with increased L pleural effusion.  Going for thora today - Completed Abx for CAP.  - hold further IV diuresis  # AKI on CKD 3 Cr bump with IV lasix x 2 doses yesterday, from 1.11 -- 1.64, GFR 32. Continue to monitor closely.   Anticipate discharge readiness from a cardiac perspective this PM if thora can be done today + if she feels well following this, if not tomorrow AM. Follow up in clinic with Minda Ditto, PA-C 1-2 weeks after discharge.   This patient's plan of care was discussed and created with Dr. Juliann Pares and he is in agreement.  Signed: Rebeca Allegra , PA-C 05/12/2022, 8:23 AM [facility]

## 2022-05-13 ENCOUNTER — Inpatient Hospital Stay: Payer: Medicare HMO

## 2022-05-13 DIAGNOSIS — I482 Chronic atrial fibrillation, unspecified: Secondary | ICD-10-CM | POA: Diagnosis not present

## 2022-05-13 LAB — BASIC METABOLIC PANEL
Anion gap: 10 (ref 5–15)
BUN: 54 mg/dL — ABNORMAL HIGH (ref 8–23)
CO2: 29 mmol/L (ref 22–32)
Calcium: 9.2 mg/dL (ref 8.9–10.3)
Chloride: 98 mmol/L (ref 98–111)
Creatinine, Ser: 1.34 mg/dL — ABNORMAL HIGH (ref 0.44–1.00)
GFR, Estimated: 41 mL/min — ABNORMAL LOW (ref >60)
Glucose, Bld: 120 mg/dL — ABNORMAL HIGH (ref 70–99)
Potassium: 5.1 mmol/L (ref 3.5–5.1)
Sodium: 137 mmol/L (ref 135–145)

## 2022-05-13 MED ORDER — TRAZODONE HCL 50 MG PO TABS
50.0000 mg | ORAL_TABLET | Freq: Every day | ORAL | Status: DC
  Administered 2022-05-13 – 2022-05-14 (×2): 50 mg via ORAL
  Filled 2022-05-13 (×2): qty 1

## 2022-05-13 MED ORDER — METOPROLOL TARTRATE 25 MG PO TABS
25.0000 mg | ORAL_TABLET | Freq: Three times a day (TID) | ORAL | Status: DC
  Administered 2022-05-13 – 2022-05-14 (×5): 25 mg via ORAL
  Filled 2022-05-13 (×5): qty 1

## 2022-05-13 MED ORDER — METOPROLOL TARTRATE 50 MG PO TABS: 50.0000 mg | ORAL_TABLET | Freq: Two times a day (BID) | ORAL | Status: DC

## 2022-05-13 MED ORDER — MIDODRINE HCL 5 MG PO TABS
5.0000 mg | ORAL_TABLET | Freq: Three times a day (TID) | ORAL | Status: DC
  Administered 2022-05-13 – 2022-05-14 (×2): 5 mg via ORAL
  Filled 2022-05-13 (×2): qty 1

## 2022-05-13 MED ORDER — METOPROLOL TARTRATE 25 MG PO TABS: 25.0000 mg | ORAL_TABLET | Freq: Two times a day (BID) | ORAL | Status: DC

## 2022-05-13 NOTE — Progress Notes (Signed)
Patient's heart rate was increased up to 148, MD made aware of, morning dose of Metoprolol, Diltiazem and Amiodarone given, Dr. York Spaniel he will adjust the dose of her Metoprolol.

## 2022-05-13 NOTE — Progress Notes (Addendum)
PROGRESS NOTE    TEKILA CAILLOUET   ZOX:[id number] DOB: [birth year]  DOA: 05/05/2022 Date of Service: 05/13/22 PCP: [facility]     Brief Narrative / Hospital Course:   TAHESHA SKEET is a 77 y.o. female with medical history significant of A-fib on Eliquis, s/p of Watchman left atrial appendage closure device placement on 04/20/23, hypertension, hyperlipidemia, asthma, diastolic CHF, stroke, CKD-3A, chronic pain, carotid artery stenosis, who presents to the ED from home 05/10/2022 with cough, shortness of breath, chest pain x2 days. Patient recently had Watchman left atrial appendage closure device placement on 04/20/23.  04/17: atrial fibrillation with RVR, heart rate up to 137. Required cardizem drip in ED. Negative PCR for COVID, flu and RSV, WBC 13.7, lactic acid 1.3, procalcitonin 0.19. Urinalysis (cloudy appearance, small amount of leukocyte, negative nitrite, WBC 0-5 with squamous cell contamination 21-50), slightly worsening renal function, BNP 716, trop 6 --> 5. Temperature normal, blood pressure 102/74, RR 25-37, oxygen saturation 97% on room air. Pt is admitted PCU as inpt. Tx for Afib RVR and sepsis d/t CAP.  04/18: significantly anxious, became over sedated w/ valium, low BP, improved in afternoon. Echo reviewed from Watchman device placement - TEE 04/20/22 LVEF 55-60% normal LV and RV fxn, LA mod dilated. Cardiology to adjust rate/rhythm control --> d/c dilt gtt, start po diltiazem, change coreg to metoprolol 12.5 mg po q6h, started amiodarone bolus/gtt. Remained very anxious - alprazolam lower dose administered.  04/19:  converted to sinus rhythm about mid-day. Concern worsening infiltrate on CXR, add azithromycin. 04/20: amio gtt d/c overnight d/t hypotension/bradycardia.CXR personally reviewed - infiltrate worse on L appears more c/w edema/vascular congestion, trial another dose lasix 40 mg IV diuresis. RVR recurred, restarted amiodarone gtt lower dose since had po amio also, BP a  bit low so holding metoprolol po for now. Echo ordered.  04/21: Echo reviewed - EF 60-65%, indeterminate diastolic parameters, enlarged RV, moderately dilated LA and RA, small pericardial effusion w/o tamponade. BP soft. Maintain amio gtt, added midodrine rather than add fluids.  04/22: Overnight HR to 146, given IV metoprolol and converted to sinus but hypotensive, got IV fluids, amio gtt held. Converted to sinus. Somnolent but rousable, reduced/adjusted sedating medications.  04/23: remains in/out Afib RVR. Increased metoprolol frequency. Cardiology hesitant on DCCV - may need repeat imaging to r/o thrombus with recent Watchman placement  4/24-4/25: Remains dyspneic, likely chf from a-fib, diuresing 4/26: Thoracentesis, 600 ml   Consultants:  Cardiology    Procedures: None            ASSESSMENT & PLAN:   Principal Problem:   CAP (community acquired pneumonia) Active Problems:   Sepsis   Chronic diastolic CHF (congestive heart failure)   Chronic atrial fibrillation with RVR   Asthma   HTN (hypertension)   Chronic pain   HLD (hyperlipidemia)   Chronic kidney disease, stage 3a   Normocytic anemia     Sepsis due to CAP (community acquired pneumonia)  vs SIRS d/t Afib RVR and anxiety  Elevated HR and RR can be either/both etiology Met criteria with WBC 13.6, heart rate up to 137 and RR 25-37.  Lactic acid normal 1.3.  Procalcitonin 0.19.    Urine legionella and S. pneumococcal antigen - neg Treated as below    Community Acquired Pneumonia  S/p abx :IV Rocephin and doxycycline to cover PNA, changed doxy to azithromycin Mucinex for cough  Bronchodilators   Chronic atrial fibrillation with acute RVR Difficult to control, today back  in a fib with rvr, hr improved currently to low 100s Amiodarone gtt per cardiology d/c as of 01:00 05/08/22  PO amio 400mg  BID then on 5/2 200mg  daily thereafter.  Metop and dilt consolidated today by cardiology Eliquis 5 mg po bid --> continue  eliquis 5mg  BID x 45 days following Watchman as per EP  Cardiology following, appreciate assistance    Acute hypoxic respiratory failure  Pleural effusions Supplemental O2 as needed, currently on 4 liters, likely fluid mediated from a-fib, cr bump with diuresis, diagnostic/therapeutic thora on 4/26 with 600 ml yellow fluid, ldh elevated so this may be exudate Will check ct of chest and consult pulmonology  End-of-life care Patient has been bedbound since back surgery in January, now with difficult to control a-fib, pleural effusions, hypoxic respiratory failure. Homero Fellers discussion with patient and family today about long-term poor prognosis, offered palliative consult, etc. They decline that and prefer no more discussion of patient's prognosis, wish to continue current full scope of care.    Chronic diastolic CHF (congestive heart failure), w/ exacerbation  2D echo on 04/20/2022 showed EF of 55-60%. Bnp elevated I/os not well documented, have spoken to nursing about that Lasix on pause 2/2 aki today Thora as above   Severe anxiety Sensitivity to benzodiazepine (oversedation w/ Valium)  Insomnia Scheduled BuSpar, declines ssri, requests benzo but explained these are relatively contraindicated and will give only as needed and will not rx at discharge Will add trazodone qhs for insomnia   Nausea Controlled w/ current regimen   Asthma Bronchodilators   Hypotension Midodrine started 4/26   Chronic pain Continue as needed Norco, reduced dose  Scheduled reduced dose tizanidine  As needed Tylenol   HLD (hyperlipidemia) Pravastatin   AKI on Chronic kidney disease, stage 3a:  Cr bump with lasix, which is on hold. Mild hyperkalemia also iatrogenic, improved on repeat. Cr improved today to 1.3, k 5.1   Normocytic anemia:  Mild, no active bleeding. Monitor  Carotid stenosis S/p endarterectomy 09/2021     DVT prophylaxis: Eliquis Pertinent IV fluids/nutrition: no continuous IV fluids  at this time  Central lines / invasive devices: none   Code Status: FULL CODE ACP documents reviewed: none on file    Current Admission Status: inpatient, progressive unit d/t RVR difficult to control TOC needs / Dispo plan: Home w/ home health Barriers to discharge / significant pending items: remains hypoxic and dyspneic         Subjective / Brief ROS:  Main complaint is ongoing dyspnea and anxiety, also chronic neck pain   Family Communication: husband and daughter updated @ bedside 4/27    Objective Findings:  Vitals:   05/13/22 0848 05/13/22 0900 05/13/22 1000 05/13/22 1100  BP: (!) 92/54  (!) 99/57 (!) 84/61  Pulse: (!) 138 69 (!) 116 (!) 111  Resp: (!) 39 (!) 29 (!) 32 20  Temp: 98 F (36.7 C)     TempSrc: Oral     SpO2: 93% (!) 65% 92% 94%  Weight:      Height:       No intake or output data in the 24 hours ending 05/13/22 1211  Filed Weights   05/11/22 0405 05/12/22 0400 05/12/22 2345  Weight: 69.4 kg 69.5 kg 69.5 kg    Examination:  Anxious Mild tachypnea, shallow respirations RRR Abd soft Ext warm, no edema       Scheduled Medications:   amiodarone  400 mg Oral BID   Followed by   Melene Muller  ON 05/18/2022] amiodarone  200 mg Oral Daily   apixaban  5 mg Oral BID   busPIRone  5 mg Oral TID   diltiazem  120 mg Oral Daily   feeding supplement  237 mL Oral BID AC   magnesium oxide  800 mg Oral Daily   metoprolol tartrate  25 mg Oral BID   midodrine  2.5 mg Oral TID WC   pravastatin  40 mg Oral QHS   tiZANidine  2 mg Oral TID    Continuous Infusions:  promethazine (PHENERGAN) injection (IM or IVPB) 6.25 mg (05/09/22 1821)     PRN Medications:  acetaminophen, albuterol, dextromethorphan-guaiFENesin, HYDROcodone-acetaminophen, hydrOXYzine, melatonin, ondansetron (ZOFRAN) IV, prochlorperazine, promethazine (PHENERGAN) injection (IM or IVPB)  Antimicrobials from admission:  Anti-infectives (From admission, onward)    Start     Dose/Rate  Route Frequency Ordered Stop   05/08/22 1415  azithromycin (ZITHROMAX) tablet 500 mg        500 mg Oral Daily 05/08/22 1324 05/09/22 0809   05/05/22 1600  azithromycin (ZITHROMAX) 500 mg in sodium chloride 0.9 % 250 mL IVPB  Status:  Discontinued        500 mg 250 mL/hr over 60 Minutes Intravenous Every 24 hours 05/05/22 1515 05/08/22 1324   05/14/2022 1900  doxycycline (VIBRA-TABS) tablet 100 mg  Status:  Discontinued        100 mg Oral Every 12 hours 05/08/2022 1129 05/07/22 1219   05/05/2022 1200  cefTRIAXone (ROCEPHIN) 2 g in sodium chloride 0.9 % 100 mL IVPB        2 g 200 mL/hr over 30 Minutes Intravenous Every 24 hours 04/27/2022 1129 05/09/22 1306   04/22/2022 0600  doxycycline (VIBRAMYCIN) 100 mg in sodium chloride 0.9 % 250 mL IVPB        100 mg 125 mL/hr over 120 Minutes Intravenous  Once 05/02/2022 0459 04/22/2022 0810           Data Reviewed:  I have personally reviewed the following...  CBC: Recent Labs  Lab 05/07/22 0520 05/10/22 0504  WBC 10.2 13.3*  HGB 9.7* 9.7*  HCT 30.7* 30.7*  MCV 91.6 92.5  PLT 407* 411*   Basic Metabolic Panel: Recent Labs  Lab 05/09/22 0407 05/10/22 0504 05/11/22 0616 05/12/22 0400 05/12/22 0853 05/13/22 0415  NA 138 137 141 140  --  137  K 3.9 4.2 3.9 5.4* 5.1 5.1  CL 100 101 101 99  --  98  CO2 27 25 30 31   --  29  GLUCOSE 124* 91 125* 124*  --  120*  BUN 30* 29* 35* 48*  --  54*  CREATININE 1.24* 1.20* 1.11* 1.64*  --  1.34*  CALCIUM 8.8* 8.9 9.4 9.4  --  9.2  MG 2.0  --  1.8  --   --   --    GFR: Estimated Creatinine Clearance: 32.6 mL/min (A) (by C-G formula based on SCr of 1.34 mg/dL (H)). Liver Function Tests: Recent Labs  Lab 05/12/22 0400  AST 29  ALT 26  ALKPHOS 187*  BILITOT 0.4  PROT 6.7  ALBUMIN 3.1*   No results for input(s): "LIPASE", "AMYLASE" in the last 168 hours.  No results for input(s): "AMMONIA" in the last 168 hours. Coagulation Profile: No results for input(s): "INR", "PROTIME" in the last 168  hours. Cardiac Enzymes: No results for input(s): "CKTOTAL", "CKMB", "CKMBINDEX", "TROPONINI" in the last 168 hours. BNP (last 3 results) No results for input(s): "PROBNP" in the last 8760 hours.  HbA1C: No results for input(s): "HGBA1C" in the last 72 hours. CBG: No results for input(s): "GLUCAP" in the last 168 hours. Lipid Profile: No results for input(s): "CHOL", "HDL", "LDLCALC", "TRIG", "CHOLHDL", "LDLDIRECT" in the last 72 hours. Thyroid Function Tests: No results for input(s): "TSH", "T4TOTAL", "FREET4", "T3FREE", "THYROIDAB" in the last 72 hours.  Anemia Panel: No results for input(s): "VITAMINB12", "FOLATE", "FERRITIN", "TIBC", "IRON", "RETICCTPCT" in the last 72 hours. Most Recent Urinalysis On File:     Component Value Date/Time   COLORURINE YELLOW (A) 05/10/2022 0811   APPEARANCEUR CLOUDY (A) 05/10/2022 0811   APPEARANCEUR Clear 02/19/2013 0916   LABSPEC 1.018 04/30/2022 0811   LABSPEC 1.010 02/19/2013 0916   PHURINE 5.0 05/02/2022 0811   GLUCOSEU NEGATIVE 05/05/2022 0811   GLUCOSEU Negative 02/19/2013 0916   HGBUR NEGATIVE 05/14/2022 0811   BILIRUBINUR NEGATIVE 05/07/2022 0811   BILIRUBINUR negative 10/23/2017 1324   BILIRUBINUR Negative 02/19/2013 0916   KETONESUR NEGATIVE 04/20/2022 0811   PROTEINUR 30 (A) 05/09/2022 0811   UROBILINOGEN 0.2 10/23/2017 1324   NITRITE NEGATIVE 05/02/2022 0811   LEUKOCYTESUR SMALL (A) 04/19/2022 0811   LEUKOCYTESUR Negative 02/19/2013 0916   Sepsis Labs: @LABRCNTIP (procalcitonin:4,lacticidven:4) Microbiology: Recent Results (from the past 240 hour(s))  Culture, blood (routine x 2)     Status: None   Collection Time: 05/04/22  2:54 PM   Specimen: BLOOD  Result Value Ref Range Status   Specimen Description BLOOD BLOOD LEFT HAND  Final   Special Requests   Final    BOTTLES DRAWN AEROBIC AND ANAEROBIC Blood Culture adequate volume   Culture   Final    NO GROWTH 5 DAYS Performed at [facility], [address]  [place], [place] [address]    Report Status 05/09/2022 FINAL  Final      Radiology Studies last 3 days: US THORACENTESIS ASP PLEURAL SPACE W/IMG GUIDE  Result Date: 05/12/2022 INDICATION: History of AFib on Eliquis, status post Watchman left atrial appendage closure device placed on 04/20/2022, HTN, asthma, CHF and CKD stage 3. Patient complains of cough and shortness of breath with chest pain x2 days. Imaging found patient to have increase in left pleural effusion. Patient was referred to IR for diagnostic and therapeutic left thoracentesis. EXAM: ULTRASOUND GUIDED DIAGNOSTIC AND THERAPEUTIC LEFT THORACENTESIS MEDICATIONS: 15 mL 1% lidocaine COMPLICATIONS: None immediate. PROCEDURE: An ultrasound guided thoracentesis was thoroughly discussed with the patient and questions answered. The benefits, risks, alternatives and complications were also discussed. The patient understands and wishes to proceed with the procedure. Written consent was obtained. Ultrasound was performed to localize and mark an adequate pocket of fluid in the left chest. The area was then prepped and draped in the normal sterile fashion. 1% Lidocaine was used for local anesthesia. Under ultrasound guidance a 6 Fr Safe-T-Centesis catheter was introduced. Thoracentesis was performed. The catheter was removed and a dressing applied. FINDINGS: A total of approximately 600 cc of clear, amber fluid was removed. Samples were sent to the laboratory as requested by the clinical team. IMPRESSION: Successful ultrasound guided left thoracentesis yielding 600 cc of pleural fluid. Electronically Signed   By: Malachy Moan M.D.   On: 05/12/2022 16:17   DG Chest Port 1 View  Result Date: 05/12/2022 CLINICAL DATA:  Pleural effusion, status post left thoracentesis EXAM: PORTABLE CHEST 1 VIEW COMPARISON:  05/11/2022 FINDINGS: Marked improvement in left pleural effusion with no residual blunting of the left lateral costophrenic angle. No pneumothorax or  complicating feature. Moderate enlargement of the cardiopericardial  silhouette noted. Atherosclerotic calcification of the aortic arch. Continued blunting of the right costophrenic angle and mild hazy density at the right lung base likely from right pleural fluid. Stable thoracolumbar fixation hardware. Stable moderate degenerative glenohumeral arthropathy. Stable lower cervical fixation hardware. Left atrial occluder device noted. IMPRESSION: 1. Marked improvement in left pleural effusion status post thoracentesis. No pneumothorax. 2. Moderate enlargement of the cardiopericardial silhouette. 3. Small right pleural effusion. 4. Moderate degenerative glenohumeral arthropathy. 5.  Aortic Atherosclerosis (ICD10-I70.0). Electronically Signed   By: Gaylyn Rong M.D.   On: 05/12/2022 16:07   DG Chest Port 1 View  Result Date: 05/11/2022 CLINICAL DATA:  Dyspnea EXAM: PORTABLE CHEST 1 VIEW COMPARISON:  X-ray 05/06/2022 and older FINDINGS: Enlarged cardiopericardial silhouette with calcified tortuous aorta. Intra-atrial appendage occlusion device. Extensive hardware along the spine. Enlarged heart with increasing left effusion and opacity. Trace right effusion. No pneumothorax. Overlapping cardiac leads IMPRESSION: Increasing left-sided pleural effusion and opacity. Recommend follow-up Electronically Signed   By: Karen Kays M.D.   On: 05/11/2022 15:24             LOS: 10 days       Silvano Bilis, MD [facility] 05/13/2022, 12:11 PM    Staff may message me via secure chat in Epic  but this may not receive an immediate response,  please page me for urgent matters!  If 7PM-7AM, please contact night coverage [email or web address]

## 2022-05-13 NOTE — Progress Notes (Signed)
[facility]    SUBJECTIVE: Resting comfortably in bed denies any complaint.  Status postthoracentesis with improved breathing and no pain denies palpitations still weak and fatigued lying in bed feels somewhat better   Vitals:   05/13/22 0900 05/13/22 1000 05/13/22 1100 05/13/22 1233  BP:  (!) 99/57 (!) 84/61 102/77  Pulse: 69 (!) 116 (!) 111 (!) 103  Resp: (!) 29 (!) 32 20 19  Temp:    98.2 F (36.8 C)  TempSrc:    Oral  SpO2: (!) 65% 92% 94% 94%  Weight:      Height:        No intake or output data in the 24 hours ending 05/13/22 1248    PHYSICAL EXAM  General: Well developed, well nourished, in no acute distress HEENT:  Normocephalic and atramatic Neck:  No JVD.  Lungs: Clear bilaterally to auscultation and percussion. Heart: Irregularly irregular. Normal S1 and S2 without gallops or murmurs.  Abdomen: Bowel sounds are positive, abdomen soft and non-tender  Msk:  Back normal, normal gait. Normal strength and tone for age. Extremities: No clubbing, cyanosis or edema.   Neuro: Alert and oriented X 3. Psych:  Good affect, responds appropriately   LABS: Basic Metabolic Panel: Recent Labs    05/11/22 0616 05/12/22 0400 05/12/22 0853 05/13/22 0415  NA 141 140  --  137  K 3.9 5.4* 5.1 5.1  CL 101 99  --  98  CO2 30 31  --  29  GLUCOSE 125* 124*  --  120*  BUN 35* 48*  --  54*  CREATININE 1.11* 1.64*  --  1.34*  CALCIUM 9.4 9.4  --  9.2  MG 1.8  --   --   --    Liver Function Tests: Recent Labs    05/12/22 0400  AST 29  ALT 26  ALKPHOS 187*  BILITOT 0.4  PROT 6.7  ALBUMIN 3.1*   No results for input(s): "LIPASE", "AMYLASE" in the last 72 hours. CBC: No results for input(s): "WBC", "NEUTROABS", "HGB", "HCT", "MCV", "PLT" in the last 72 hours. Cardiac Enzymes: No results for input(s): "CKTOTAL", "CKMB", "CKMBINDEX", "TROPONINI" in the last 72 hours. BNP: Invalid input(s): "POCBNP" D-Dimer: No results for input(s): "DDIMER" in the last 72  hours. Hemoglobin A1C: No results for input(s): "HGBA1C" in the last 72 hours. Fasting Lipid Panel: No results for input(s): "CHOL", "HDL", "LDLCALC", "TRIG", "CHOLHDL", "LDLDIRECT" in the last 72 hours. Thyroid Function Tests: No results for input(s): "TSH", "T4TOTAL", "T3FREE", "THYROIDAB" in the last 72 hours.  Invalid input(s): "FREET3" Anemia Panel: No results for input(s): "VITAMINB12", "FOLATE", "FERRITIN", "TIBC", "IRON", "RETICCTPCT" in the last 72 hours.  US THORACENTESIS ASP PLEURAL SPACE W/IMG GUIDE  Result Date: 05/12/2022 INDICATION: History of AFib on Eliquis, status post Watchman left atrial appendage closure device placed on 04/20/2022, HTN, asthma, CHF and CKD stage 3. Patient complains of cough and shortness of breath with chest pain x2 days. Imaging found patient to have increase in left pleural effusion. Patient was referred to IR for diagnostic and therapeutic left thoracentesis. EXAM: ULTRASOUND GUIDED DIAGNOSTIC AND THERAPEUTIC LEFT THORACENTESIS MEDICATIONS: 15 mL 1% lidocaine COMPLICATIONS: None immediate. PROCEDURE: An ultrasound guided thoracentesis was thoroughly discussed with the patient and questions answered. The benefits, risks, alternatives and complications were also discussed. The patient understands and wishes to proceed with the procedure. Written consent was obtained. Ultrasound was performed to localize and mark an adequate pocket of fluid in the left chest. The area was then  prepped and draped in the normal sterile fashion. 1% Lidocaine was used for local anesthesia. Under ultrasound guidance a 6 Fr Safe-T-Centesis catheter was introduced. Thoracentesis was performed. The catheter was removed and a dressing applied. FINDINGS: A total of approximately 600 cc of clear, amber fluid was removed. Samples were sent to the laboratory as requested by the clinical team. IMPRESSION: Successful ultrasound guided left thoracentesis yielding 600 cc of pleural fluid.  Electronically Signed   By: Malachy Moan M.D.   On: 05/12/2022 16:17   DG Chest Port 1 View  Result Date: 05/12/2022 CLINICAL DATA:  Pleural effusion, status post left thoracentesis EXAM: PORTABLE CHEST 1 VIEW COMPARISON:  05/11/2022 FINDINGS: Marked improvement in left pleural effusion with no residual blunting of the left lateral costophrenic angle. No pneumothorax or complicating feature. Moderate enlargement of the cardiopericardial silhouette noted. Atherosclerotic calcification of the aortic arch. Continued blunting of the right costophrenic angle and mild hazy density at the right lung base likely from right pleural fluid. Stable thoracolumbar fixation hardware. Stable moderate degenerative glenohumeral arthropathy. Stable lower cervical fixation hardware. Left atrial occluder device noted. IMPRESSION: 1. Marked improvement in left pleural effusion status post thoracentesis. No pneumothorax. 2. Moderate enlargement of the cardiopericardial silhouette. 3. Small right pleural effusion. 4. Moderate degenerative glenohumeral arthropathy. 5.  Aortic Atherosclerosis (ICD10-I70.0). Electronically Signed   By: Gaylyn Rong M.D.   On: 05/12/2022 16:07   DG Chest Port 1 View  Result Date: 05/11/2022 CLINICAL DATA:  Dyspnea EXAM: PORTABLE CHEST 1 VIEW COMPARISON:  X-ray 05/06/2022 and older FINDINGS: Enlarged cardiopericardial silhouette with calcified tortuous aorta. Intra-atrial appendage occlusion device. Extensive hardware along the spine. Enlarged heart with increasing left effusion and opacity. Trace right effusion. No pneumothorax. Overlapping cardiac leads IMPRESSION: Increasing left-sided pleural effusion and opacity. Recommend follow-up Electronically Signed   By: Karen Kays M.D.   On: 05/11/2022 15:24     Echo preserved left ventricular function EF of at least 60%  TELEMETRY: Atrial fibrillation rate of 100 nonspecific findings:  ASSESSMENT AND PLAN:  Principal Problem:    Chronic atrial fibrillation with RVR (HCC) Active Problems:   HLD (hyperlipidemia)   HTN (hypertension)   Chronic diastolic CHF (congestive heart failure) (HCC)   CAP (community acquired pneumonia)   Chronic kidney disease, stage 3a (HCC)   Chronic pain   Asthma   Normocytic anemia   Sepsis (HCC)    Plan Continue telemetry Rate control for atrial fibrillation currently on amiodarone metoprolol Cardizem Intermittent hypotension will increase midodrine to 5 mg 3 times a day Postthoracentesis for pleural effusion shortness of breath somewhat improved Chronic diastolic congestive heart failure reasonably compensated at this point No insufficiency stage III continue adequate hydration continue nephrology input Hyperlipidemia on Pravachol therapy for lipid management History of community-acquired pneumonia antibiotic therapy as indicated Increase activity physical therapy up out of bed to chair Anticoagulation for atrial fibrillation with Eliquis Follow-up with cardiology as an outpatient once discharged  Alwyn Pea, MD 05/13/2022 12:48 PM

## 2022-05-14 DIAGNOSIS — N179 Acute kidney failure, unspecified: Secondary | ICD-10-CM

## 2022-05-14 DIAGNOSIS — J9601 Acute respiratory failure with hypoxia: Secondary | ICD-10-CM

## 2022-05-14 DIAGNOSIS — Z9889 Other specified postprocedural states: Secondary | ICD-10-CM | POA: Diagnosis not present

## 2022-05-14 DIAGNOSIS — I482 Chronic atrial fibrillation, unspecified: Secondary | ICD-10-CM | POA: Diagnosis not present

## 2022-05-14 LAB — CBC
HCT: 26.3 % — ABNORMAL LOW (ref 36.0–46.0)
Hemoglobin: 8.2 g/dL — ABNORMAL LOW (ref 12.0–15.0)
MCH: 28.2 pg (ref 26.0–34.0)
MCHC: 31.2 g/dL (ref 30.0–36.0)
MCV: 90.4 fL (ref 80.0–100.0)
Platelets: 350 10*3/uL (ref 150–400)
RBC: 2.91 MIL/uL — ABNORMAL LOW (ref 3.87–5.11)
RDW: 15.9 % — ABNORMAL HIGH (ref 11.5–15.5)
WBC: 16.3 10*3/uL — ABNORMAL HIGH (ref 4.0–10.5)
nRBC: 0 % (ref 0.0–0.2)

## 2022-05-14 LAB — BASIC METABOLIC PANEL
Anion gap: 9 (ref 5–15)
BUN: 52 mg/dL — ABNORMAL HIGH (ref 8–23)
CO2: 29 mmol/L (ref 22–32)
Calcium: 8.8 mg/dL — ABNORMAL LOW (ref 8.9–10.3)
Chloride: 97 mmol/L — ABNORMAL LOW (ref 98–111)
Creatinine, Ser: 1.22 mg/dL — ABNORMAL HIGH (ref 0.44–1.00)
GFR, Estimated: 46 mL/min — ABNORMAL LOW (ref >60)
Glucose, Bld: 127 mg/dL — ABNORMAL HIGH (ref 70–99)
Potassium: 5.2 mmol/L — ABNORMAL HIGH (ref 3.5–5.1)
Sodium: 135 mmol/L (ref 135–145)

## 2022-05-14 MED ORDER — MORPHINE SULFATE (PF) 2 MG/ML IV SOLN
1.0000 mg | Freq: Three times a day (TID) | INTRAVENOUS | Status: DC
  Administered 2022-05-14 – 2022-05-15 (×2): 1 mg via INTRAVENOUS
  Filled 2022-05-14 (×2): qty 1

## 2022-05-14 MED ORDER — ALBUTEROL SULFATE (2.5 MG/3ML) 0.083% IN NEBU
5.0000 mg | INHALATION_SOLUTION | Freq: Once | RESPIRATORY_TRACT | Status: DC
  Filled 2022-05-14: qty 6

## 2022-05-14 MED ORDER — MORPHINE SULFATE (PF) 2 MG/ML IV SOLN
2.0000 mg | Freq: Three times a day (TID) | INTRAVENOUS | Status: DC
  Administered 2022-05-14: 2 mg via INTRAVENOUS
  Filled 2022-05-14: qty 1

## 2022-05-14 MED ORDER — MIDODRINE HCL 5 MG PO TABS
10.0000 mg | ORAL_TABLET | Freq: Three times a day (TID) | ORAL | Status: DC
  Administered 2022-05-14 (×2): 10 mg via ORAL
  Filled 2022-05-14 (×2): qty 2

## 2022-05-14 MED ORDER — FUROSEMIDE 10 MG/ML IJ SOLN
40.0000 mg | Freq: Once | INTRAMUSCULAR | Status: AC
  Administered 2022-05-14: 40 mg via INTRAVENOUS
  Filled 2022-05-14: qty 4

## 2022-05-14 NOTE — Progress Notes (Addendum)
PROGRESS NOTE    Molly Mitchell   ZDG:[id number] DOB: January 30, 1945  DOA: 05/01/2022 Date of Service: 05/14/22 PCP: [facility]  Brief Narrative / Hospital Course:   Molly Mitchell is a 77 y.o. female with medical history significant of A-fib on Eliquis, s/p of Watchman left atrial appendage closure device placement on 04/20/22, hypertension, hyperlipidemia, asthma, diastolic CHF, stroke, CKD-3A, chronic pain, carotid artery stenosis, who presents to the ED from home 05/05/2022 with cough, shortness of breath, chest pain x2 days. 04/17: atrial fibrillation with RVR, heart rate up to 137. Required cardizem drip in ED. Negative PCR for COVID, flu and RSV, WBC 13.7, lactic acid 1.3, procalcitonin 0.19. Urinalysis (cloudy appearance, small amount of leukocyte, negative nitrite, WBC 0-5 with squamous cell contamination 21-50), slightly worsening renal function, BNP 716, trop 6 --> 5. Temperature normal, blood pressure 102/74, RR 25-37, oxygen saturation 97% on room air. Pt is admitted PCU as inpt. Tx for Afib RVR and sepsis d/t CAP.  04/18: significantly anxious, became over sedated w/ valium, low BP, improved in afternoon. Echo reviewed from Watchman device placement - TEE 04/20/22 LVEF 55-60% normal LV and RV fxn, LA mod dilated. Cardiology to adjust rate/rhythm control --> d/c dilt gtt, start po diltiazem, change coreg to metoprolol 12.5 mg po q6h, started amiodarone bolus/gtt. Remained very anxious - alprazolam lower dose administered.  04/19:  converted to sinus rhythm about mid-day. Concern worsening infiltrate on CXR, add azithromycin. 04/20: amio gtt d/c overnight d/t hypotension/bradycardia.CXR personally reviewed - infiltrate worse on L appears more c/w edema/vascular congestion, trial another dose lasix 40 mg IV diuresis. RVR recurred, restarted amiodarone gtt lower dose since had po amio also, BP a bit low so holding metoprolol po for now. Echo ordered.  04/21: Echo reviewed - EF 60-65%,  indeterminate diastolic parameters, enlarged RV, moderately dilated LA and RA, small pericardial effusion w/o tamponade. BP soft. Maintain amio gtt, added midodrine rather than add fluids.  04/22: Overnight HR to 146, given IV metoprolol and converted to sinus but hypotensive, got IV fluids, amio gtt held. Converted to sinus. Somnolent but rousable, reduced/adjusted sedating medications.  04/23: remains in/out Afib RVR. Increased metoprolol frequency. Cardiology hesitant on DCCV - may need repeat imaging to r/o thrombus with recent Watchman placement  4/24-4/25: Remains dyspneic, likely chf from a-fib, diuresing 4/26: Thoracentesis, 600 ml 4/28: Patient remains tachycardic in atrial fibrillation with rates up to 130s as well as tachypneic with respiratory rate in 20s and satting 95% on 5 L nasal cannula   Consultants:  Cardiology  Pulmonology    Procedures: Echo 4/20 Thoracentesis 4/26.  600 mL transudative fluids    ASSESSMENT & PLAN:  Principal Problem:   CAP (community acquired pneumonia) Active Problems:   Sepsis   Chronic diastolic CHF (congestive heart failure)   Chronic atrial fibrillation with RVR   Asthma   HTN (hypertension)   Chronic pain   HLD (hyperlipidemia)   Chronic kidney disease, stage 3a   Normocytic anemia   Sepsis criteria without source of infection. Criteria from other means as listed below, possible CAP on admission.  S/p abx :IV Rocephin and doxycycline to cover PNA, and then  azithromycin   Chronic atrial fibrillation with acute RVR- Difficult to control. HR in 130s today. - cardiology following, appreciate your recs Amiodarone PO, may need to transition back to gtt Continue Metop and dilt Eliquis 5 mg po bid --> continue eliquis 5mg  BID x 45 days following Watchman as per EP  Acute hypoxic respiratory failure  Pleural effusions - s/p thoracentesis with transudate fluid on 4/26. Currently appears uncomfortable and on 5L [place]. Poor prognosis.  Family declines palliative talks. Not on oxygen supplement at baseline. - morphine for air hunger - pulmonology following, appreciate your recs - serial chest xrays as needed for monitoring re-accumulation if worsening   HFpEF w/ exacerbation  pericardial effusion.  2D echo on 04/20/2022 showed EF of 55-60% and mild effusion. Chest CT 4/27 graded effusion as moderate.  - repeat limited echo today to evaluate if effusion is growing.  - strict I/O - restarting diuretics today as renal function has improved and she is even mildly hyperkalemic which will also be helped with this tx   Severe anxiety  Insomnia Sensitivity to benzodiazepine (oversedation w/ Valium)  Scheduled BuSpar, declines ssri Continue trazodone nightly for insomnia Atarax TID   Nausea Controlled w/ current regimen   Asthma Bronchodilators   Hypotension - Midodrine increased to 10mg  TID   Chronic pain Continue as needed Norco, reduced dose  Scheduled reduced dose tizanidine  As needed Tylenol   HLD (hyperlipidemia) Pravastatin   AKI on Chronic kidney disease, stage 3a: improving monitor   Normocytic anemia: hgb drop today but no obvious bleed.  Mild, no active bleeding. Monitor  Carotid stenosis S/p endarterectomy 09/2021  End-of-life care Patient has been bedbound since back surgery in January, now with difficult to control a-fib, pleural effusions, hypoxic respiratory failure. - poor prognosis. Continue to offer palliative care to family, patient as they are tolerating    DVT prophylaxis: Eliquis   Code Status: FULL CODE ACP documents reviewed: none on file    Current Admission Status: inpatient, progressive unit d/t RVR difficult to control TOC needs / Dispo plan: Home w/ home health Barriers to discharge / significant pending items: remains hypoxic and dyspneic  Subjective / Brief ROS: In pain of chest/neck area. Feels very dyspneic. Unable to hold long conversation. Had extensive discussion  with her husband and daughter at bedside in regard to her care.She states that the trazodone did help some with her sleep last night.   Family Communication: husband and daughter updated @ bedside  Objective Findings:  Vitals:   05/14/22 1027 05/14/22 1028 05/14/22 1029 05/14/22 1038  BP:    (!) 88/55  Pulse:    67  Resp:    (!) 21  Temp:    97.8 F (36.6 C)  TempSrc:    Oral  SpO2: 95% 96% 96% 92%  Weight:      Height:        Intake/Output Summary (Last 24 hours) at 05/14/2022 1142 Last data filed at 05/13/2022 1500 Gross per 24 hour  Intake 250 ml  Output --  Net 250 ml    Filed Weights   05/11/22 0405 05/12/22 0400 05/12/22 2345  Weight: 69.4 kg 69.5 kg 69.5 kg    Examination:  General: in mild distress, laying eyes closed.  Cardiac: irregular tachycardic, normal heart sounds, no murmurs. 2+ radial and PT pulses bilaterally Respiratory: CTAB, increased effort and rate of breaths. Not conversational. On 5L [place]. No wheezes, rales or rhonchi Extremities: no edema. WWP. Skin: warm and dry, no rashes noted Neuro: alert and oriented, no focal deficits Psych: flat to anxious affect and mood  Scheduled Medications:   albuterol  5 mg Nebulization Once   amiodarone  400 mg Oral BID   Followed by   Melene Muller ON 05/18/2022] amiodarone  200 mg Oral Daily   apixaban  5 mg Oral BID   busPIRone  5 mg Oral TID   diltiazem  120 mg Oral Daily   feeding supplement  237 mL Oral BID AC   magnesium oxide  800 mg Oral Daily   metoprolol tartrate  25 mg Oral TID   midodrine  10 mg Oral TID WC    morphine injection  1 mg Intravenous Q8H   pravastatin  40 mg Oral QHS   tiZANidine  2 mg Oral TID   traZODone  50 mg Oral QHS    Continuous Infusions:  promethazine (PHENERGAN) injection (IM or IVPB) 6.25 mg (05/09/22 1821)    PRN Medications:  acetaminophen, albuterol, dextromethorphan-guaiFENesin, HYDROcodone-acetaminophen, hydrOXYzine, melatonin, ondansetron (ZOFRAN) IV,  prochlorperazine, promethazine (PHENERGAN) injection (IM or IVPB)  Antimicrobials from admission:  Anti-infectives (From admission, onward)    Start     Dose/Rate Route Frequency Ordered Stop   05/08/22 1415  azithromycin (ZITHROMAX) tablet 500 mg        500 mg Oral Daily 05/08/22 1324 05/09/22 0809   05/05/22 1600  azithromycin (ZITHROMAX) 500 mg in sodium chloride 0.9 % 250 mL IVPB  Status:  Discontinued        500 mg 250 mL/hr over 60 Minutes Intravenous Every 24 hours 05/05/22 1515 05/08/22 1324   05/10/2022 1900  doxycycline (VIBRA-TABS) tablet 100 mg  Status:  Discontinued        100 mg Oral Every 12 hours 05/02/2022 1129 05/07/22 1219   04/20/2022 1200  cefTRIAXone (ROCEPHIN) 2 g in sodium chloride 0.9 % 100 mL IVPB        2 g 200 mL/hr over 30 Minutes Intravenous Every 24 hours 04/25/2022 1129 05/09/22 1306   05/12/2022 0600  doxycycline (VIBRAMYCIN) 100 mg in sodium chloride 0.9 % 250 mL IVPB        100 mg 125 mL/hr over 120 Minutes Intravenous  Once 05/08/2022 0459 05/16/2022 0810       Data Reviewed:  I have personally reviewed the following...  CBC: Recent Labs  Lab 05/10/22 0504 05/14/22 0341  WBC 13.3* 16.3*  HGB 9.7* 8.2*  HCT 30.7* 26.3*  MCV 92.5 90.4  PLT 411* 350   Basic Metabolic Panel: Recent Labs  Lab 05/09/22 0407 05/10/22 0504 05/11/22 0616 05/12/22 0400 05/12/22 0853 05/13/22 0415 05/14/22 0341  NA 138 137 141 140  --  137 135  K 3.9 4.2 3.9 5.4* 5.1 5.1 5.2*  CL 100 101 101 99  --  98 97*  CO2 27 25 30 31   --  29 29  GLUCOSE 124* 91 125* 124*  --  120* 127*  BUN 30* 29* 35* 48*  --  54* 52*  CREATININE 1.24* 1.20* 1.11* 1.64*  --  1.34* 1.22*  CALCIUM 8.8* 8.9 9.4 9.4  --  9.2 8.8*  MG 2.0  --  1.8  --   --   --   --    GFR: Estimated Creatinine Clearance: 35.9 mL/min (A) (by C-G formula based on SCr of 1.22 mg/dL (H)). Liver Function Tests: Recent Labs  Lab 05/12/22 0400  AST 29  ALT 26  ALKPHOS 187*  BILITOT 0.4  PROT 6.7  ALBUMIN 3.1*    Most Recent Urinalysis On File:     Component Value Date/Time   COLORURINE YELLOW (A) 04/29/2022 0811   APPEARANCEUR CLOUDY (A) 04/30/2022 0811   APPEARANCEUR Clear 02/19/2013 0916   LABSPEC 1.018 04/27/2022 0811   LABSPEC 1.010 02/19/2013 0916   PHURINE 5.0 04/26/2022 1610  GLUCOSEU NEGATIVE 04/21/2022 0811   GLUCOSEU Negative 02/19/2013 0916   HGBUR NEGATIVE 04/25/2022 0811   BILIRUBINUR NEGATIVE 04/26/2022 0811   BILIRUBINUR negative 10/23/2017 1324   BILIRUBINUR Negative 02/19/2013 0916   KETONESUR NEGATIVE 05/08/2022 0811   PROTEINUR 30 (A) 04/30/2022 0811   UROBILINOGEN 0.2 10/23/2017 1324   NITRITE NEGATIVE 05/05/2022 0811   LEUKOCYTESUR SMALL (A) 05/07/2022 0811   LEUKOCYTESUR Negative 02/19/2013 0916   Sepsis Labs: @LABRCNTIP (procalcitonin:4,lacticidven:4) Microbiology: Recent Results (from the past 240 hour(s))  Culture, blood (routine x 2)     Status: None   Collection Time: 05/04/22  2:54 PM   Specimen: BLOOD  Result Value Ref Range Status   Specimen Description BLOOD BLOOD LEFT HAND  Final   Special Requests   Final    BOTTLES DRAWN AEROBIC AND ANAEROBIC Blood Culture adequate volume   Culture   Final    NO GROWTH 5 DAYS Performed at [facility], [address] [place], [place] [address]    Report Status 05/09/2022 FINAL  Final  Body fluid culture w Gram Stain     Status: None (Preliminary result)   Collection Time: 05/12/22  3:38 PM   Specimen: Pleura; Body Fluid  Result Value Ref Range Status   Specimen Description   Final    PLEURAL Performed at [facility], [address] [place], [place] [address]    Special Requests   Final    NONE Performed at [facility], [address] [place], [place] [address]    Gram Stain   Final    FEW WBC PRESENT, PREDOMINANTLY PMN NO ORGANISMS SEEN    Culture   Final    NO GROWTH < 24 HOURS Performed at [facility], [address] [place], [place] [address]     Report Status PENDING  Incomplete    Radiology Studies last 3 days: CT CHEST WO CONTRAST  Result Date: 05/13/2022 CLINICAL DATA:  Exudative pleural effusion, history of atrial fibrillation, recent left atrial appendage occlusion device placed EXAM: CT CHEST WITHOUT CONTRAST TECHNIQUE: Multidetector CT imaging of the chest was performed following the standard protocol without IV contrast. RADIATION DOSE REDUCTION: This exam was performed according to the departmental dose-optimization program which includes automated exposure control, adjustment of the mA and/or kV according to patient size and/or use of iterative reconstruction technique. COMPARISON:  05/12/2022, 01/30/2022 FINDINGS: Cardiovascular: Unenhanced imaging of the heart demonstrates no evidence of cardiomegaly. There is moderate pericardial effusion, measuring up to 1.5 cm in thickness. There is suggestion of possible pericardial thickening, though evaluation is limited without contrast. Correlation with echocardiography may be useful. Left atrial appendage occlusion device is identified. Normal caliber of the thoracic aorta. Atherosclerosis of the aorta and coronary vasculature. Evaluation of the vascular lumen is limited without IV contrast. Mediastinum/Nodes: No enlarged mediastinal or axillary lymph nodes. Thyroid gland, trachea, and esophagus demonstrate no significant findings. Lungs/Pleura: There are moderate bilateral pleural effusions, left greater than right. Dense areas of consolidation within the dependent lower lobes most consistent with atelectasis. There is some indeterminate opacification within the left upper lobe abutting the major fissure, which could reflect atelectasis or acute airspace disease. No pneumothorax. Central airways are patent. Upper Abdomen: No acute abnormality. Musculoskeletal: No acute displaced fractures. Postsurgical changes are seen from multilevel posterior thoracolumbar fusion. Reconstructed images  demonstrate no additional findings. IMPRESSION: 1. Moderate pericardial effusion, with possible associated pericardial thickening. Correlation with echocardiography is recommended. 2. Moderate bilateral pleural effusions, left greater than right.  Dependent bilateral lower lobe consolidation favors atelectasis. Left upper lobe consolidation abutting the major fissure is indeterminate, and could reflect pneumonia or atelectasis. 3. Aortic Atherosclerosis (ICD10-I70.0). Coronary artery atherosclerosis. Electronically Signed   By: Sharlet Salina M.D.   On: 05/13/2022 14:51   US THORACENTESIS ASP PLEURAL SPACE W/IMG GUIDE  Result Date: 05/12/2022 INDICATION: History of AFib on Eliquis, status post Watchman left atrial appendage closure device placed on 04/20/2022, HTN, asthma, CHF and CKD stage 3. Patient complains of cough and shortness of breath with chest pain x2 days. Imaging found patient to have increase in left pleural effusion. Patient was referred to IR for diagnostic and therapeutic left thoracentesis. EXAM: ULTRASOUND GUIDED DIAGNOSTIC AND THERAPEUTIC LEFT THORACENTESIS MEDICATIONS: 15 mL 1% lidocaine COMPLICATIONS: None immediate. PROCEDURE: An ultrasound guided thoracentesis was thoroughly discussed with the patient and questions answered. The benefits, risks, alternatives and complications were also discussed. The patient understands and wishes to proceed with the procedure. Written consent was obtained. Ultrasound was performed to localize and mark an adequate pocket of fluid in the left chest. The area was then prepped and draped in the normal sterile fashion. 1% Lidocaine was used for local anesthesia. Under ultrasound guidance a 6 Fr Safe-T-Centesis catheter was introduced. Thoracentesis was performed. The catheter was removed and a dressing applied. FINDINGS: A total of approximately 600 cc of clear, amber fluid was removed. Samples were sent to the laboratory as requested by the clinical team.  IMPRESSION: Successful ultrasound guided left thoracentesis yielding 600 cc of pleural fluid. Electronically Signed   By: Malachy Moan M.D.   On: 05/12/2022 16:17   DG Chest Port 1 View  Result Date: 05/12/2022 CLINICAL DATA:  Pleural effusion, status post left thoracentesis EXAM: PORTABLE CHEST 1 VIEW COMPARISON:  05/11/2022 FINDINGS: Marked improvement in left pleural effusion with no residual blunting of the left lateral costophrenic angle. No pneumothorax or complicating feature. Moderate enlargement of the cardiopericardial silhouette noted. Atherosclerotic calcification of the aortic arch. Continued blunting of the right costophrenic angle and mild hazy density at the right lung base likely from right pleural fluid. Stable thoracolumbar fixation hardware. Stable moderate degenerative glenohumeral arthropathy. Stable lower cervical fixation hardware. Left atrial occluder device noted. IMPRESSION: 1. Marked improvement in left pleural effusion status post thoracentesis. No pneumothorax. 2. Moderate enlargement of the cardiopericardial silhouette. 3. Small right pleural effusion. 4. Moderate degenerative glenohumeral arthropathy. 5.  Aortic Atherosclerosis (ICD10-I70.0). Electronically Signed   By: Gaylyn Rong M.D.   On: 05/12/2022 16:07   DG Chest Port 1 View  Result Date: 05/11/2022 CLINICAL DATA:  Dyspnea EXAM: PORTABLE CHEST 1 VIEW COMPARISON:  X-ray 05/06/2022 and older FINDINGS: Enlarged cardiopericardial silhouette with calcified tortuous aorta. Intra-atrial appendage occlusion device. Extensive hardware along the spine. Enlarged heart with increasing left effusion and opacity. Trace right effusion. No pneumothorax. Overlapping cardiac leads IMPRESSION: Increasing left-sided pleural effusion and opacity. Recommend follow-up Electronically Signed   By: Karen Kays M.D.   On: 05/11/2022 15:24     LOS: 11 days   Leeroy Bock, MD [facility] 05/14/2022, 11:42 AM     Staff may message me via secure chat in Epic  but this may not receive an immediate response,  please page me for urgent matters!  If 7PM-7AM, please contact night coverage [email or web address]

## 2022-05-14 NOTE — Progress Notes (Signed)
[facility] Cardiology    SUBJECTIVE: Patient states to be doing reasonably well still weak fatigued no worsening shortness of breath no pain no fever chills or sweats   Vitals:   05/14/22 1027 05/14/22 1028 05/14/22 1029 05/14/22 1038  BP:    (!) 88/55  Pulse:    67  Resp:    (!) 21  Temp:    97.8 F (36.6 C)  TempSrc:    Oral  SpO2: 95% 96% 96% 92%  Weight:      Height:         Intake/Output Summary (Last 24 hours) at 05/14/2022 1115 Last data filed at 05/13/2022 1500 Gross per 24 hour  Intake 250 ml  Output --  Net 250 ml      PHYSICAL EXAM  General: Well developed, well nourished, in no acute distress HEENT:  Normocephalic and atramatic Neck:  No JVD.  Lungs: Clear bilaterally to auscultation and percussion. Heart: Irregular irregular. Normal S1 and S2 without gallops or murmurs.  Abdomen: Bowel sounds are positive, abdomen soft and non-tender  Msk:  Back normal, normal gait. Normal strength and tone for age. Extremities: No clubbing, cyanosis or edema.   Neuro: Alert and oriented X 3. Psych:  Good affect, responds appropriately   LABS: Basic Metabolic Panel: Recent Labs    05/13/22 0415 05/14/22 0341  NA 137 135  K 5.1 5.2*  CL 98 97*  CO2 29 29  GLUCOSE 120* 127*  BUN 54* 52*  CREATININE 1.34* 1.22*  CALCIUM 9.2 8.8*   Liver Function Tests: Recent Labs    05/12/22 0400  AST 29  ALT 26  ALKPHOS 187*  BILITOT 0.4  PROT 6.7  ALBUMIN 3.1*   No results for input(s): "LIPASE", "AMYLASE" in the last 72 hours. CBC: Recent Labs    05/14/22 0341  WBC 16.3*  HGB 8.2*  HCT 26.3*  MCV 90.4  PLT 350   Cardiac Enzymes: No results for input(s): "CKTOTAL", "CKMB", "CKMBINDEX", "TROPONINI" in the last 72 hours. BNP: Invalid input(s): "POCBNP" D-Dimer: No results for input(s): "DDIMER" in the last 72 hours. Hemoglobin A1C: No results for input(s): "HGBA1C" in the last 72 hours. Fasting Lipid Panel: No results for input(s): "CHOL", "HDL", "LDLCALC",  "TRIG", "CHOLHDL", "LDLDIRECT" in the last 72 hours. Thyroid Function Tests: No results for input(s): "TSH", "T4TOTAL", "T3FREE", "THYROIDAB" in the last 72 hours.  Invalid input(s): "FREET3" Anemia Panel: No results for input(s): "VITAMINB12", "FOLATE", "FERRITIN", "TIBC", "IRON", "RETICCTPCT" in the last 72 hours.  CT CHEST WO CONTRAST  Result Date: 05/13/2022 CLINICAL DATA:  Exudative pleural effusion, history of atrial fibrillation, recent left atrial appendage occlusion device placed EXAM: CT CHEST WITHOUT CONTRAST TECHNIQUE: Multidetector CT imaging of the chest was performed following the standard protocol without IV contrast. RADIATION DOSE REDUCTION: This exam was performed according to the departmental dose-optimization program which includes automated exposure control, adjustment of the mA and/or kV according to patient size and/or use of iterative reconstruction technique. COMPARISON:  05/12/2022, 01/30/2022 FINDINGS: Cardiovascular: Unenhanced imaging of the heart demonstrates no evidence of cardiomegaly. There is moderate pericardial effusion, measuring up to 1.5 cm in thickness. There is suggestion of possible pericardial thickening, though evaluation is limited without contrast. Correlation with echocardiography may be useful. Left atrial appendage occlusion device is identified. Normal caliber of the thoracic aorta. Atherosclerosis of the aorta and coronary vasculature. Evaluation of the vascular lumen is limited without IV contrast. Mediastinum/Nodes: No enlarged mediastinal or axillary lymph nodes. Thyroid gland, trachea, and esophagus demonstrate  no significant findings. Lungs/Pleura: There are moderate bilateral pleural effusions, left greater than right. Dense areas of consolidation within the dependent lower lobes most consistent with atelectasis. There is some indeterminate opacification within the left upper lobe abutting the major fissure, which could reflect atelectasis or acute  airspace disease. No pneumothorax. Central airways are patent. Upper Abdomen: No acute abnormality. Musculoskeletal: No acute displaced fractures. Postsurgical changes are seen from multilevel posterior thoracolumbar fusion. Reconstructed images demonstrate no additional findings. IMPRESSION: 1. Moderate pericardial effusion, with possible associated pericardial thickening. Correlation with echocardiography is recommended. 2. Moderate bilateral pleural effusions, left greater than right. Dependent bilateral lower lobe consolidation favors atelectasis. Left upper lobe consolidation abutting the major fissure is indeterminate, and could reflect pneumonia or atelectasis. 3. Aortic Atherosclerosis (ICD10-I70.0). Coronary artery atherosclerosis. Electronically Signed   By: Sharlet Salina M.D.   On: 05/13/2022 14:51   US THORACENTESIS ASP PLEURAL SPACE W/IMG GUIDE  Result Date: 05/12/2022 INDICATION: History of AFib on Eliquis, status post Watchman left atrial appendage closure device placed on 04/20/2022, HTN, asthma, CHF and CKD stage 3. Patient complains of cough and shortness of breath with chest pain x2 days. Imaging found patient to have increase in left pleural effusion. Patient was referred to IR for diagnostic and therapeutic left thoracentesis. EXAM: ULTRASOUND GUIDED DIAGNOSTIC AND THERAPEUTIC LEFT THORACENTESIS MEDICATIONS: 15 mL 1% lidocaine COMPLICATIONS: None immediate. PROCEDURE: An ultrasound guided thoracentesis was thoroughly discussed with the patient and questions answered. The benefits, risks, alternatives and complications were also discussed. The patient understands and wishes to proceed with the procedure. Written consent was obtained. Ultrasound was performed to localize and mark an adequate pocket of fluid in the left chest. The area was then prepped and draped in the normal sterile fashion. 1% Lidocaine was used for local anesthesia. Under ultrasound guidance a 6 Fr Safe-T-Centesis catheter  was introduced. Thoracentesis was performed. The catheter was removed and a dressing applied. FINDINGS: A total of approximately 600 cc of clear, amber fluid was removed. Samples were sent to the laboratory as requested by the clinical team. IMPRESSION: Successful ultrasound guided left thoracentesis yielding 600 cc of pleural fluid. Electronically Signed   By: Malachy Moan M.D.   On: 05/12/2022 16:17   DG Chest Port 1 View  Result Date: 05/12/2022 CLINICAL DATA:  Pleural effusion, status post left thoracentesis EXAM: PORTABLE CHEST 1 VIEW COMPARISON:  05/11/2022 FINDINGS: Marked improvement in left pleural effusion with no residual blunting of the left lateral costophrenic angle. No pneumothorax or complicating feature. Moderate enlargement of the cardiopericardial silhouette noted. Atherosclerotic calcification of the aortic arch. Continued blunting of the right costophrenic angle and mild hazy density at the right lung base likely from right pleural fluid. Stable thoracolumbar fixation hardware. Stable moderate degenerative glenohumeral arthropathy. Stable lower cervical fixation hardware. Left atrial occluder device noted. IMPRESSION: 1. Marked improvement in left pleural effusion status post thoracentesis. No pneumothorax. 2. Moderate enlargement of the cardiopericardial silhouette. 3. Small right pleural effusion. 4. Moderate degenerative glenohumeral arthropathy. 5.  Aortic Atherosclerosis (ICD10-I70.0). Electronically Signed   By: Gaylyn Rong M.D.   On: 05/12/2022 16:07     Echo previous preserved left ventricular function small pericardial effusion EF of 60%  TELEMETRY: Paroxysmal atrial fibrillation with sinus most recent rate in the 60s sinus:  ASSESSMENT AND PLAN:  Principal Problem:   Chronic atrial fibrillation with RVR (HCC) Active Problems:   HLD (hyperlipidemia)   HTN (hypertension)   Chronic diastolic CHF (congestive heart failure) (HCC)   CAP (  community acquired  pneumonia)   Chronic kidney disease, stage 3a (HCC)   Chronic pain   Asthma   Normocytic anemia   Sepsis (HCC)    Plan Continue current therapy with rate control amiodarone Cardizem metoprolol Hypertension management and control with episodes of hypotension continue midodrine as necessary Hyperlipidemia continue statin therapy for lipid management GERD renal insufficiency recommend adequate hydration Chronic diastolic dysfunction no worsening dyspnea patient had small pericardial effusion and pleural effusion status post thoracentesis Small pericardial effusion continue consider echocardiogram for reevaluation of pericardial effusion Asthma chronic stable continue inhalers as necessary Continue current therapy   Alwyn Pea, MD 05/14/2022 11:15 AM

## 2022-05-14 NOTE — Plan of Care (Signed)
  Problem: Education: Goal: Knowledge of cardiac device and self-care will improve Outcome: Progressing Goal: Ability to safely manage health related needs after discharge will improve Outcome: Progressing Goal: Individualized Educational Video(s) Outcome: Progressing   Problem: Cardiac: Goal: Ability to achieve and maintain adequate cardiopulmonary perfusion will improve Outcome: Progressing   Problem: Education: Goal: Ability to demonstrate management of disease process will improve Outcome: Progressing Goal: Ability to verbalize understanding of medication therapies will improve Outcome: Progressing Goal: Individualized Educational Video(s) Outcome: Progressing   Problem: Activity: Goal: Capacity to carry out activities will improve Outcome: Progressing   Problem: Cardiac: Goal: Ability to achieve and maintain adequate cardiopulmonary perfusion will improve Outcome: Progressing   Problem: Activity: Goal: Ability to tolerate increased activity will improve Outcome: Progressing   Problem: Clinical Measurements: Goal: Ability to maintain a body temperature in the normal range will improve Outcome: Progressing   Problem: Respiratory: Goal: Ability to maintain adequate ventilation will improve Outcome: Progressing Goal: Ability to maintain a clear airway will improve Outcome: Progressing   Problem: Education: Goal: Knowledge of General Education information will improve Description: Including pain rating scale, medication(s)/side effects and non-pharmacologic comfort measures Outcome: Progressing   Problem: Health Behavior/Discharge Planning: Goal: Ability to manage health-related needs will improve Outcome: Progressing   Problem: Clinical Measurements: Goal: Ability to maintain clinical measurements within normal limits will improve Outcome: Progressing Goal: Will remain free from infection Outcome: Progressing Goal: Diagnostic test results will  improve Outcome: Progressing Goal: Respiratory complications will improve Outcome: Progressing Goal: Cardiovascular complication will be avoided Outcome: Progressing   Problem: Activity: Goal: Risk for activity intolerance will decrease Outcome: Progressing   Problem: Nutrition: Goal: Adequate nutrition will be maintained Outcome: Progressing   Problem: Coping: Goal: Level of anxiety will decrease Outcome: Progressing   Problem: Elimination: Goal: Will not experience complications related to bowel motility Outcome: Progressing Goal: Will not experience complications related to urinary retention Outcome: Progressing   Problem: Pain Managment: Goal: General experience of comfort will improve Outcome: Progressing   Problem: Safety: Goal: Ability to remain free from injury will improve Outcome: Progressing   Problem: Skin Integrity: Goal: Risk for impaired skin integrity will decrease Outcome: Progressing

## 2022-05-14 NOTE — Consult Note (Signed)
PULMONOLOGY         Date: 05/14/2022,   MRN# [id number] Molly Mitchell 08-Dec-1945     AdmissionWeight: 65.8 kg                 CurrentWeight: 69.5 kg  Referring provider: Dr Ashok Pall   CHIEF COMPLAINT:   Acute hypoxemic respiratory failure   HISTORY OF PRESENT ILLNESS   77 yo F with chronic cough, A-fib on Eliquis, s/p of Watchman left atrial appendage closure device placement on 04/20/23, hypertension, hyperlipidemia, asthma, diastolic CHF, cva, CKD-3A, chronic pain, carotid artery stenosis, who presents with cough, shortness of breath, chest pain.Chest pain has resolved now. Data reviewed independently and ED Course: pt was found to have negative PCR for COVID, flu and RSV, WBC 13.7, lactic acid 1.3, procalcitonin 0.19. Urinalysis (cloudy appearance, small amount of leukocyte, negative nitrite, WBC 0-5 with squamous cell contamination 21-50), slightly worsening renal function, BNP 716, trop 6 --> 5. Temperature normal, blood pressure 102/74, She was found to have pleural effusions. Her AF is being treated medically with cardiology serivce.        PAST MEDICAL HISTORY   Past Medical History:  Diagnosis Date   Acute right MCA stroke (HCC) 08/01/2021   a.) noted on MRI head 08/01/2021 --> punctate acute RIGHT MCA territory infarcts affecting the frontal and parietal contex consistent with microembolic infarcts in the RIGHT carotid circulation   Anemia    Arthritis    Bilateral carotid artery disease (HCC) 08/18/2021   a.) CTA neck 08/18/2021: 70-80% RICA and 40% LICA   Cervicalgia    Chronic kidney disease    Chronic pain syndrome    Chronic, continuous use of opioids    a.) hydrocodone/APAP (Norco 10/325 mg)   Connective tissue disease (HCC)    pt reports being diagnosed with undifferentiated connective tissue disease at some point- pt took steroids for this and has had no issues since- 10 years ago   Coronary artery disease    Cortical senile cataract    COVID 2021    DDD (degenerative disc disease), cervical    a.) s/p C4-C6 ACDF   DDD (degenerative disc disease), lumbar    a.) s/p laminectomy + posterior L2-S1 fusion   Dysrhythmia    A-fib   History of 2019 novel coronavirus disease (COVID-19) 12/07/2018   History of blood transfusion    History of heart murmur in childhood    History of rheumatic fever    HLD (hyperlipidemia)    Hypertension    Lower extremity weakness    a.) with position changes from sitting to standing   Osteoporosis    Presence of Watchman left atrial appendage closure device 04/20/2022   27mm Watchman FLX placed by Dr. Lalla Brothers   Pseudogout    Shingles    Skin cancer of face    Spondylolisthesis of lumbar region      SURGICAL HISTORY   Past Surgical History:  Procedure Laterality Date   ABDOMINAL HYSTERECTOMY  1981   AIKEN OSTEOTOMY Right 01/30/2018   Procedure: WEIL RIGHT 2ND AND 3RD;  Surgeon: Gwyneth Revels, DPM;  Location: [facility];  Service: Podiatry;  Laterality: Right;   ANTERIOR CERVICAL DECOMP/DISCECTOMY FUSION N/A 2003   C4-C6   BACK SURGERY     pt reports hx of 3 lumber back surgeries total   CERVICAL SPINE SURGERY     pt reports having 3 cervical fusions total   ENDARTERECTOMY Right 09/21/2021   Procedure: ENDARTERECTOMY  CAROTID;  Surgeon: Renford Dills, MD;  Location: [facility];  Service: Vascular;  Laterality: Right;  Right carotid   HAMMER TOE SURGERY Right 01/30/2018   Procedure: HAMMER TOE CORRECTION SECOND AND THIRD;  Surgeon: Gwyneth Revels, DPM;  Location: [facility];  Service: Podiatry;  Laterality: Right;  GENERAL WITH LOCAL   HAMMER TOE SURGERY Left 03/05/2019   Procedure: HAMMER TOE CORRECTION T1 AND T2;  Surgeon: Gwyneth Revels, DPM;  Location: [facility];  Service: Podiatry;  Laterality: Left;   LEFT ATRIAL APPENDAGE OCCLUSION N/A 04/20/2022   Procedure: LEFT ATRIAL APPENDAGE OCCLUSION;  Surgeon: Lanier Prude, MD;  Location: [facility] CV [facility];   Service: Cardiovascular;  Laterality: N/A;   POSTERIOR LUMBAR FUSION N/A 2001   L2-S1   ROTATOR CUFF REPAIR Right 1981   SACROPLASTY N/A 03/29/2017   S1   TEE WITHOUT CARDIOVERSION N/A 04/20/2022   Procedure: TRANSESOPHAGEAL ECHOCARDIOGRAM;  Surgeon: Lanier Prude, MD;  Location: [facility] INVAS[facility] CV LAB;  Service: Cardiovascular;  Laterality: N/A;   TOTAL KNEE ARTHROPLASTY Left 03/03/2013   TOTAL KNEE REVISION Left 07/31/2016   Procedure: TOTAL KNEE REVISION;  Surgeon: Donato Heinz, MD;  Location: [facility];  Service: Orthopedics;  Laterality: Left;   WEIL OSTEOTOMY Left 03/05/2019   Procedure: WEIL OSTEOTOMY X 2 LEFT;  Surgeon: Gwyneth Revels, DPM;  Location: [facility];  Service: Podiatry;  Laterality: Left;     FAMILY HISTORY   Family History  Problem Relation Age of Onset   Hypertension Brother    Hypertension Mother    Hypertension Sister    Cancer Sister    Diabetes Paternal Grandmother    Heart failure Maternal Grandmother      SOCIAL HISTORY   Social History   Tobacco Use   Smoking status: Former    Packs/day: 1.00    Years: 15.00    Additional pack years: 0.00    Total pack years: 15.00    Types: Cigarettes    Quit date: 04/17/1979    Years since quitting: 43.1   Smokeless tobacco: Never  Vaping Use   Vaping Use: Never used  Substance Use Topics   Alcohol use: No    Alcohol/week: 0.0 standard drinks of alcohol   Drug use: No     MEDICATIONS    Home Medication:    Current Medication:  Current Facility-Administered Medications:    acetaminophen (TYLENOL) tablet 650 mg, 650 mg, Oral, Q6H PRN, Lorretta Harp, MD, 650 mg at 05/09/22 0557   albuterol (PROVENTIL) (2.5 MG/3ML) 0.083% nebulizer solution 3 mL, 3 mL, Inhalation, Q4H PRN, Lorretta Harp, MD   amiodarone (PACERONE) tablet 400 mg, 400 mg, Oral, BID, 400 mg at 05/13/22 2133 **FOLLOWED BY** [START ON 05/18/2022] amiodarone (PACERONE) tablet 200 mg, 200 mg, Oral, Daily, Tang, Lily Michelle, PA-C    apixaban (ELIQUIS) tablet 5 mg, 5 mg, Oral, BID, Lorretta Harp, MD, 5 mg at 05/13/22 2133   busPIRone (BUSPAR) tablet 5 mg, 5 mg, Oral, TID, Sunnie Nielsen, DO, 5 mg at 05/13/22 2133   dextromethorphan-guaiFENesin (MUCINEX DM) 30-600 MG per 12 hr tablet 1 tablet, 1 tablet, Oral, BID PRN, Lorretta Harp, MD, 1 tablet at 05/10/22 2116   diltiazem (CARDIZEM CD) 24 hr capsule 120 mg, 120 mg, Oral, Daily, Tang, Lily Michelle, PA-C, 120 mg at 05/13/22 0827   feeding supplement (ENSURE ENLIVE / ENSURE PLUS) liquid 237 mL, 237 mL, Oral, BID AC, Tang, Franklin, PA-C, 237 mL at 05/13/22 1636   HYDROcodone-acetaminophen (  NORCO/VICODIN) 5-325 MG per tablet 1 tablet, 1 tablet, Oral, Q6H PRN, Sunnie Nielsen, DO, 1 tablet at 05/14/22 0109   hydrOXYzine (ATARAX) tablet 25 mg, 25 mg, Oral, TID PRN, Ashok Pall, Wilfred Curtis, MD, 25 mg at 05/13/22 1934   magnesium oxide (MAG-OX) tablet 800 mg, 800 mg, Oral, Daily, Wouk, Wilfred Curtis, MD, 800 mg at 05/13/22 0851   melatonin tablet 5 mg, 5 mg, Oral, QHS PRN, Manuela Schwartz, NP, 5 mg at 05/12/22 2146   metoprolol tartrate (LOPRESSOR) tablet 25 mg, 25 mg, Oral, TID, Callwood, Dwayne D, MD, 25 mg at 05/13/22 2133   midodrine (PROAMATINE) tablet 5 mg, 5 mg, Oral, TID WC, Callwood, Dwayne D, MD, 5 mg at 05/13/22 1634   ondansetron (ZOFRAN) injection 4 mg, 4 mg, Intravenous, Q8H PRN, Lorretta Harp, MD, 4 mg at 05/09/22 1130   pravastatin (PRAVACHOL) tablet 40 mg, 40 mg, Oral, QHS, Lorretta Harp, MD, 40 mg at 05/13/22 2133   prochlorperazine (COMPAZINE) injection 5 mg, 5 mg, Intravenous, Q6H PRN, Sunnie Nielsen, DO, 5 mg at 05/09/22 1559   promethazine (PHENERGAN) 6.25 mg in sodium chloride 0.9 % 50 mL IVPB, 6.25 mg, Intravenous, Q6H PRN, Sunnie Nielsen, DO, Last Rate: 200 mL/hr at 05/09/22 1821, 6.25 mg at 05/09/22 1821   tiZANidine (ZANAFLEX) tablet 2 mg, 2 mg, Oral, TID, Sunnie Nielsen, DO, 2 mg at 05/13/22 2134   traZODone (DESYREL) tablet 50 mg, 50 mg, Oral, QHS,  Wouk, Wilfred Curtis, MD, 50 mg at 05/13/22 2133    ALLERGIES   Amitriptyline hcl, Duricef [cefadroxil], Levofloxacin, Tetanus toxoid, Tramadol, and Erythromycin     REVIEW OF SYSTEMS    Review of Systems:  Gen:  Denies  fever, sweats, chills weigh loss  HEENT: Denies blurred vision, double vision, ear pain, eye pain, hearing loss, nose bleeds, sore throat Cardiac:  No dizziness, chest pain or heaviness, chest tightness,edema Resp:   reports dyspnea chronically  Gi: Denies swallowing difficulty, stomach pain, nausea or vomiting, diarrhea, constipation, bowel incontinence Gu:  Denies bladder incontinence, burning urine Ext:   Denies Joint pain, stiffness or swelling Skin: Denies  skin rash, easy bruising or bleeding or hives Endoc:  Denies polyuria, polydipsia , polyphagia or weight change Psych:   Denies depression, insomnia or hallucinations   Other:  All other systems negative   VS: BP (!) 113/56 (BP Location: Left Arm)   Pulse 67   Temp 98 F (36.7 C) (Oral)   Resp 20   Ht 5\' 2"  (1.575 m)   Wt 69.5 kg   SpO2 97%   BMI 28.02 kg/m      PHYSICAL EXAM    GENERAL:NAD, no fevers, chills, no weakness no fatigue HEAD: Normocephalic, atraumatic.  EYES: Pupils equal, round, reactive to light. Extraocular muscles intact. No scleral icterus.  MOUTH: Moist mucosal membrane. Dentition intact. No abscess noted.  EAR, NOSE, THROAT: Clear without exudates. No external lesions.  NECK: Supple. No thyromegaly. No nodules. No JVD.  PULMONARY: decreased breath sounds with mild rhonchi worse at bases bilaterally.  CARDIOVASCULAR: S1 and S2. Regular rate and rhythm. No murmurs, rubs, or gallops. No edema. Pedal pulses 2+ bilaterally.  GASTROINTESTINAL: Soft, nontender, nondistended. No masses. Positive bowel sounds. No hepatosplenomegaly.  MUSCULOSKELETAL: No swelling, clubbing, or edema. Range of motion full in all extremities.  NEUROLOGIC: Cranial nerves II through XII are intact.  No gross focal neurological deficits. Sensation intact. Reflexes intact.  SKIN: No ulceration, lesions, rashes, or cyanosis. Skin warm and dry. Turgor intact.  PSYCHIATRIC: Mood,  affect within normal limits. The patient is awake, alert and oriented x 3. Insight, judgment intact.       IMAGING     Narrative & Impression  CLINICAL DATA:  Exudative pleural effusion, history of atrial fibrillation, recent left atrial appendage occlusion device placed   EXAM: CT CHEST WITHOUT CONTRAST   TECHNIQUE: Multidetector CT imaging of the chest was performed following the standard protocol without IV contrast.   RADIATION DOSE REDUCTION: This exam was performed according to the departmental dose-optimization program which includes automated exposure control, adjustment of the mA and/or kV according to patient size and/or use of iterative reconstruction technique.   COMPARISON:  05/12/2022, 01/30/2022   FINDINGS: Cardiovascular: Unenhanced imaging of the heart demonstrates no evidence of cardiomegaly. There is moderate pericardial effusion, measuring up to 1.5 cm in thickness. There is suggestion of possible pericardial thickening, though evaluation is limited without contrast. Correlation with echocardiography may be useful. Left atrial appendage occlusion device is identified. Normal caliber of the thoracic aorta. Atherosclerosis of the aorta and coronary vasculature. Evaluation of the vascular lumen is limited without IV contrast.   Mediastinum/Nodes: No enlarged mediastinal or axillary lymph nodes. Thyroid gland, trachea, and esophagus demonstrate no significant findings.   Lungs/Pleura: There are moderate bilateral pleural effusions, left greater than right. Dense areas of consolidation within the dependent lower lobes most consistent with atelectasis. There is some indeterminate opacification within the left upper lobe abutting the major fissure, which could reflect atelectasis  or acute airspace disease. No pneumothorax. Central airways are patent.   Upper Abdomen: No acute abnormality.   Musculoskeletal: No acute displaced fractures. Postsurgical changes are seen from multilevel posterior thoracolumbar fusion. Reconstructed images demonstrate no additional findings.   IMPRESSION: 1. Moderate pericardial effusion, with possible associated pericardial thickening. Correlation with echocardiography is recommended. 2. Moderate bilateral pleural effusions, left greater than right. Dependent bilateral lower lobe consolidation favors atelectasis. Left upper lobe consolidation abutting the major fissure is indeterminate, and could reflect pneumonia or atelectasis. 3. Aortic Atherosclerosis (ICD10-I70.0). Coronary artery atherosclerosis.     Electronically Signed   By: Sharlet Salina M.D.   On: 05/13/2022 14:51    ASSESSMENT/PLAN   Bilateral pleural effusion with mixed neutrophil and lymphocyte predominant exudative profile       -this is a falsely exudative fluid - please treat as transudate secondary to CHF.  I have reviewed cell count and differential , pleural fluid glucose, both serum and fluid LDH      Bibasilar atelectasis    Encourage patient to use IS and flutter valve  - will order metaneb device with nebs    Acute on chronic renal failure     - complicates diuresis of pericardial and pleural fluid   Thank you for allowing me to participate in the care of this patient.   Patient/Family are satisfied with care plan and all questions have been answered.    Provider disclosure: Patient with at least one acute or chronic illness or injury that poses a threat to life or bodily function and is being managed actively during this encounter.  All of the below services have been performed independently by signing provider:  review of prior documentation from internal and or external health records.  Review of previous and current lab results.   Interview and comprehensive assessment during patient visit today. Review of current and previous chest radiographs/CT scans. Discussion of management and test interpretation with health care team and patient/family.   This document was prepared using Dragon  voice recognition software and may include unintentional dictation errors.     Vida Rigger, M.D.  Division of Pulmonary & Critical Care Medicine

## 2022-05-15 DIAGNOSIS — R079 Chest pain, unspecified: Secondary | ICD-10-CM

## 2022-05-15 DIAGNOSIS — I4891 Unspecified atrial fibrillation: Secondary | ICD-10-CM

## 2022-05-15 LAB — BODY FLUID CULTURE W GRAM STAIN: Culture: NO GROWTH

## 2022-05-15 LAB — BASIC METABOLIC PANEL
Anion gap: 13 (ref 5–15)
BUN: 39 mg/dL — ABNORMAL HIGH (ref 8–23)
CO2: 27 mmol/L (ref 22–32)
Calcium: 8.6 mg/dL — ABNORMAL LOW (ref 8.9–10.3)
Chloride: 95 mmol/L — ABNORMAL LOW (ref 98–111)
Creatinine, Ser: 1.01 mg/dL — ABNORMAL HIGH (ref 0.44–1.00)
GFR, Estimated: 58 mL/min — ABNORMAL LOW (ref >60)
Glucose, Bld: 148 mg/dL — ABNORMAL HIGH (ref 70–99)
Potassium: 4.3 mmol/L (ref 3.5–5.1)
Sodium: 135 mmol/L (ref 135–145)

## 2022-05-15 LAB — CBC
HCT: 27.4 % — ABNORMAL LOW (ref 36.0–46.0)
Hemoglobin: 8.8 g/dL — ABNORMAL LOW (ref 12.0–15.0)
MCH: 28.4 pg (ref 26.0–34.0)
MCHC: 32.1 g/dL (ref 30.0–36.0)
MCV: 88.4 fL (ref 80.0–100.0)
Platelets: 355 10*3/uL (ref 150–400)
RBC: 3.1 MIL/uL — ABNORMAL LOW (ref 3.87–5.11)
RDW: 15.7 % — ABNORMAL HIGH (ref 11.5–15.5)
WBC: 14.4 10*3/uL — ABNORMAL HIGH (ref 4.0–10.5)
nRBC: 0 % (ref 0.0–0.2)

## 2022-05-15 LAB — PATHOLOGIST SMEAR REVIEW

## 2022-05-15 MED ORDER — PROCHLORPERAZINE 25 MG RE SUPP: 25.0000 mg | Freq: Two times a day (BID) | RECTAL | Status: DC | PRN

## 2022-05-15 MED ORDER — LORAZEPAM 2 MG/ML IJ SOLN
0.5000 mg/h | INTRAVENOUS | Status: DC
  Administered 2022-05-15: 0.5 mg/h via INTRAVENOUS
  Filled 2022-05-15: qty 25

## 2022-05-15 MED ORDER — MENTHOL 3 MG MT LOZG
1.0000 | LOZENGE | OROMUCOSAL | Status: DC | PRN
  Filled 2022-05-15: qty 9

## 2022-05-15 MED ORDER — SALINE SPRAY 0.65 % NA SOLN
1.0000 | NASAL | Status: DC | PRN
  Administered 2022-05-15: 1 via NASAL
  Filled 2022-05-15: qty 44

## 2022-05-15 MED ORDER — MORPHINE BOLUS VIA INFUSION
2.0000 mg | INTRAVENOUS | Status: DC | PRN
  Administered 2022-05-15 (×7): 2 mg via INTRAVENOUS

## 2022-05-15 MED ORDER — PROCHLORPERAZINE MALEATE 5 MG PO TABS: 5.0000 mg | ORAL_TABLET | Freq: Four times a day (QID) | ORAL | Status: DC | PRN

## 2022-05-15 MED ORDER — MORPHINE 100MG IN NS 100ML (1MG/ML) PREMIX INFUSION
10.0000 mg/h | INTRAVENOUS | Status: DC
  Administered 2022-05-15: 1 mg/h via INTRAVENOUS
  Filled 2022-05-15: qty 100

## 2022-05-15 MED ORDER — QUETIAPINE FUMARATE 25 MG PO TABS: 12.5000 mg | ORAL_TABLET | Freq: Every day | ORAL | Status: DC

## 2022-05-15 MED ORDER — PROCHLORPERAZINE EDISYLATE 10 MG/2ML IJ SOLN: 10.0000 mg | Freq: Two times a day (BID) | INTRAMUSCULAR | Status: DC | PRN

## 2022-05-15 MED ORDER — METOPROLOL TARTRATE 5 MG/5ML IV SOLN
2.5000 mg | Freq: Once | INTRAVENOUS | Status: AC
  Administered 2022-05-15: 2.5 mg via INTRAVENOUS
  Filled 2022-05-15: qty 5

## 2022-05-15 MED ORDER — TRAZODONE HCL 50 MG PO TABS: 50.0000 mg | ORAL_TABLET | Freq: Every day | ORAL | Status: DC

## 2022-05-15 MED ORDER — TIZANIDINE HCL 2 MG PO TABS: 2.0000 mg | ORAL_TABLET | Freq: Three times a day (TID) | ORAL | Status: DC | PRN

## 2022-05-15 MED ORDER — ACETAMINOPHEN 650 MG RE SUPP: 650.0000 mg | Freq: Four times a day (QID) | RECTAL | Status: DC | PRN

## 2022-05-15 MED ORDER — QUETIAPINE FUMARATE 25 MG PO TABS
12.5000 mg | ORAL_TABLET | Freq: Once | ORAL | Status: AC
  Administered 2022-05-15: 12.5 mg via ORAL
  Filled 2022-05-15: qty 1

## 2022-05-15 MED ORDER — LORATADINE 10 MG PO TABS
10.0000 mg | ORAL_TABLET | Freq: Every day | ORAL | Status: DC | PRN
  Administered 2022-05-15: 10 mg via ORAL
  Filled 2022-05-15: qty 1

## 2022-05-15 MED ORDER — ACETAMINOPHEN 325 MG PO TABS: 650.0000 mg | ORAL_TABLET | Freq: Four times a day (QID) | ORAL | Status: DC | PRN

## 2022-05-16 LAB — BODY FLUID CULTURE W GRAM STAIN

## 2022-05-17 NOTE — Progress Notes (Signed)
   04/19/2022 1400  Spiritual Encounters  Type of Visit Initial  Care provided to: Family  Referral source Nurse (RN/NT/LPN)  Reason for visit Grief/loss  OnCall Visit Yes  Spiritual Framework  Presenting Themes Caregiving needs  Family Stress Factors Loss  Interventions  Spiritual Care Interventions Made Established relationship of care and support;Compassionate presence;Prayer;Supported grief process  Spiritual Care Plan  Spiritual Care Issues Still Outstanding No further spiritual care needs at this time (see row info)   Called by medical staff patient pass away and I provided care and prayer to the family.

## 2022-05-17 NOTE — Consult Note (Incomplete)
PULMONOLOGY         Date: 05/08/2022,   MRN# [id number] Molly Mitchell 05-16-1945     AdmissionWeight: 65.8 kg                 CurrentWeight: 68.3 kg  Referring provider: Dr Ashok Pall   CHIEF COMPLAINT:   Acute hypoxemic respiratory failure   HISTORY OF PRESENT ILLNESS   77 yo F with chronic cough, A-fib on Eliquis, s/p of Watchman left atrial appendage closure device placement on 04/20/23, hypertension, hyperlipidemia, asthma, diastolic CHF, cva, CKD-3A, chronic pain, carotid artery stenosis, who presents with cough, shortness of breath, chest pain.Chest pain has resolved now. Data reviewed independently and ED Course: pt was found to have negative PCR for COVID, flu and RSV, WBC 13.7, lactic acid 1.3, procalcitonin 0.19. Urinalysis (cloudy appearance, small amount of leukocyte, negative nitrite, WBC 0-5 with squamous cell contamination 21-50), slightly worsening renal function, BNP 716, trop 6 --> 5. Temperature normal, blood pressure 102/74, She was found to have pleural effusions. Her AF is being treated medically with cardiology serivce.    04/30/2022- patient has been seen and is agreeable to comfort measures.  She has signigicant and severe comoribidities and I agree with compassionate approach to care with de-escalation of aggressive therapy.  PCCM will sign off today     PAST MEDICAL HISTORY   Past Medical History:  Diagnosis Date   Acute right MCA stroke (HCC) 08/01/2021   a.) noted on MRI head 08/01/2021 --> punctate acute RIGHT MCA territory infarcts affecting the frontal and parietal contex consistent with microembolic infarcts in the RIGHT carotid circulation   Anemia    Arthritis    Bilateral carotid artery disease (HCC) 08/18/2021   a.) CTA neck 08/18/2021: 70-80% RICA and 40% LICA   Cervicalgia    Chronic kidney disease    Chronic pain syndrome    Chronic, continuous use of opioids    a.) hydrocodone/APAP (Norco 10/325 mg)   Connective tissue disease (HCC)     pt reports being diagnosed with undifferentiated connective tissue disease at some point- pt took steroids for this and has had no issues since- 10 years ago   Coronary artery disease    Cortical senile cataract    COVID 2021   DDD (degenerative disc disease), cervical    a.) s/p C4-C6 ACDF   DDD (degenerative disc disease), lumbar    a.) s/p laminectomy + posterior L2-S1 fusion   Dysrhythmia    A-fib   History of 2019 novel coronavirus disease (COVID-19) 12/07/2018   History of blood transfusion    History of heart murmur in childhood    History of rheumatic fever    HLD (hyperlipidemia)    Hypertension    Lower extremity weakness    a.) with position changes from sitting to standing   Osteoporosis    Presence of Watchman left atrial appendage closure device 04/20/2022   27mm Watchman FLX placed by Dr. Lalla Brothers   Pseudogout    Shingles    Skin cancer of face    Spondylolisthesis of lumbar region      SURGICAL HISTORY   Past Surgical History:  Procedure Laterality Date   ABDOMINAL HYSTERECTOMY  1981   AIKEN OSTEOTOMY Right 01/30/2018   Procedure: WEIL RIGHT 2ND AND 3RD;  Surgeon: Gwyneth Revels, DPM;  Location: [facility];  Service: Podiatry;  Laterality: Right;   ANTERIOR CERVICAL DECOMP/DISCECTOMY FUSION N/A 2003   C4-C6   BACK SURGERY  pt reports hx of 3 lumber back surgeries total   CERVICAL SPINE SURGERY     pt reports having 3 cervical fusions total   ENDARTERECTOMY Right 09/21/2021   Procedure: ENDARTERECTOMY CAROTID;  Surgeon: Renford Dills, MD;  Location: [facility];  Service: Vascular;  Laterality: Right;  Right carotid   HAMMER TOE SURGERY Right 01/30/2018   Procedure: HAMMER TOE CORRECTION SECOND AND THIRD;  Surgeon: Gwyneth Revels, DPM;  Location: [facility];  Service: Podiatry;  Laterality: Right;  GENERAL WITH LOCAL   HAMMER TOE SURGERY Left 03/05/2019   Procedure: HAMMER TOE CORRECTION T1 AND T2;  Surgeon: Gwyneth Revels, DPM;   Location: [facility];  Service: Podiatry;  Laterality: Left;   LEFT ATRIAL APPENDAGE OCCLUSION N/A 04/20/2022   Procedure: LEFT ATRIAL APPENDAGE OCCLUSION;  Surgeon: Lanier Prude, MD;  Location: [facility];  Service: Cardiovascular;  Laterality: N/A;   POSTERIOR LUMBAR FUSION N/A 2001   L2-S1   ROTATOR CUFF REPAIR Right 1981   SACROPLASTY N/A 03/29/2017   S1   TEE WITHOUT CARDIOVERSION N/A 04/20/2022   Procedure: TRANSESOPHAGEAL ECHOCARDIOGRAM;  Surgeon: Lanier Prude, MD;  Location: [facility];  Service: Cardiovascular;  Laterality: N/A;   TOTAL KNEE ARTHROPLASTY Left 03/03/2013   TOTAL KNEE REVISION Left 07/31/2016   Procedure: TOTAL KNEE REVISION;  Surgeon: Donato Heinz, MD;  Location: [facility];  Service: Orthopedics;  Laterality: Left;   WEIL OSTEOTOMY Left 03/05/2019   Procedure: WEIL OSTEOTOMY X 2 LEFT;  Surgeon: Gwyneth Revels, DPM;  Location: [facility];  Service: Podiatry;  Laterality: Left;     FAMILY HISTORY   Family History  Problem Relation Age of Onset   Hypertension Brother    Hypertension Mother    Hypertension Sister    Cancer Sister    Diabetes Paternal Grandmother    Heart failure Maternal Grandmother      SOCIAL HISTORY   Social History   Tobacco Use   Smoking status: Former    Packs/day: 1.00    Years: 15.00    Additional pack years: 0.00    Total pack years: 15.00    Types: Cigarettes    Quit date: 04/17/1979    Years since quitting: 43.1   Smokeless tobacco: Never  Vaping Use   Vaping Use: Never used  Substance Use Topics   Alcohol use: No    Alcohol/week: 0.0 standard drinks of alcohol   Drug use: No     MEDICATIONS    Home Medication:    Current Medication:  Current Facility-Administered Medications:    acetaminophen (TYLENOL) tablet 650 mg, 650 mg, Oral, Q6H PRN, Lorretta Harp, MD, 650 mg at 05/09/22 0557   albuterol (PROVENTIL) (2.5 MG/3ML) 0.083% nebulizer solution 3 mL, 3 mL, Inhalation,  Q4H PRN, Lorretta Harp, MD, 3 mL at 05/10/2022 0226   albuterol (PROVENTIL) (2.5 MG/3ML) 0.083% nebulizer solution 5 mg, 5 mg, Nebulization, Once, Leeroy Bock, MD   amiodarone (PACERONE) tablet 400 mg, 400 mg, Oral, BID, 400 mg at 05/14/22 2115 **FOLLOWED BY** [START ON 05/18/2022] amiodarone (PACERONE) tablet 200 mg, 200 mg, Oral, Daily, Tang, Lily Michelle, PA-C   apixaban (ELIQUIS) tablet 5 mg, 5 mg, Oral, BID, Lorretta Harp, MD, 5 mg at 05/14/22 2116   busPIRone (BUSPAR) tablet 5 mg, 5 mg, Oral, TID, Sunnie Nielsen, DO, 5 mg at 05/14/22 2116   dextromethorphan-guaiFENesin (MUCINEX DM) 30-600 MG per 12 hr tablet 1 tablet, 1 tablet, Oral, BID PRN, Lorretta Harp,  MD, 1 tablet at 05/10/22 2116   diltiazem (CARDIZEM CD) 24 hr capsule 120 mg, 120 mg, Oral, Daily, Tang, Lily Michelle, PA-C, 120 mg at 05/14/22 0804   feeding supplement (ENSURE ENLIVE / ENSURE PLUS) liquid 237 mL, 237 mL, Oral, BID AC, Tang, [place], PA-C, 237 mL at 05/14/22 0754   HYDROcodone-acetaminophen (NORCO/VICODIN) 5-325 MG per tablet 1 tablet, 1 tablet, Oral, Q6H PRN, Sunnie Nielsen, DO, 1 tablet at 04/17/2022 0210   hydrOXYzine (ATARAX) tablet 25 mg, 25 mg, Oral, TID PRN, Ashok Pall, Wilfred Curtis, MD, 25 mg at 04/24/2022 0232   loratadine (CLARITIN) tablet 10 mg, 10 mg, Oral, Daily PRN, Leeroy Bock, MD, 10 mg at 04/27/2022 0258   magnesium oxide (MAG-OX) tablet 800 mg, 800 mg, Oral, Daily, Wouk, Wilfred Curtis, MD, 800 mg at 05/14/22 0804   melatonin tablet 5 mg, 5 mg, Oral, QHS PRN, Manuela Schwartz, NP, 5 mg at 05/12/22 2146   menthol-cetylpyridinium (CEPACOL) lozenge 3 mg, 1 lozenge, Oral, PRN, Foust, Katy L, NP   metoprolol tartrate (LOPRESSOR) tablet 25 mg, 25 mg, Oral, TID, Callwood, Dwayne D, MD, 25 mg at 05/14/22 2115   midodrine (PROAMATINE) tablet 10 mg, 10 mg, Oral, TID WC, Leeroy Bock, MD, 10 mg at 05/14/22 1555   morphine (PF) 2 MG/ML injection 1 mg, 1 mg, Intravenous, Q8H, Leeroy Bock, MD, 1 mg  at 05/06/2022 0502   ondansetron (ZOFRAN) injection 4 mg, 4 mg, Intravenous, Q8H PRN, Lorretta Harp, MD, 4 mg at 04/17/2022 0753   pravastatin (PRAVACHOL) tablet 40 mg, 40 mg, Oral, QHS, Lorretta Harp, MD, 40 mg at 05/14/22 2116   prochlorperazine (COMPAZINE) injection 5 mg, 5 mg, Intravenous, Q6H PRN, Sunnie Nielsen, DO, 5 mg at 05/09/22 1559   promethazine (PHENERGAN) 6.25 mg in sodium chloride 0.9 % 50 mL IVPB, 6.25 mg, Intravenous, Q6H PRN, Sunnie Nielsen, DO, Last Rate: 200 mL/hr at 05/09/22 1821, 6.25 mg at 05/09/22 1821   QUEtiapine (SEROQUEL) tablet 12.5 mg, 12.5 mg, Oral, QHS, Leeroy Bock, MD   sodium chloride (OCEAN) 0.65 % nasal spray 1 spray, 1 spray, Each Nare, PRN, Leeroy Bock, MD, 1 spray at 04/18/2022 1610   tiZANidine (ZANAFLEX) tablet 2 mg, 2 mg, Oral, TID, Sunnie Nielsen, DO, 2 mg at 05/14/22 2117   traZODone (DESYREL) tablet 50 mg, 50 mg, Oral, Q supper, Leeroy Bock, MD    ALLERGIES   Amitriptyline hcl, Duricef [cefadroxil], Levofloxacin, Tetanus toxoid, Tramadol, and Erythromycin     REVIEW OF SYSTEMS    Review of Systems:  Gen:  Denies  fever, sweats, chills weigh loss  HEENT: Denies blurred vision, double vision, ear pain, eye pain, hearing loss, nose bleeds, sore throat Cardiac:  No dizziness, chest pain or heaviness, chest tightness,edema Resp:   reports dyspnea chronically  Gi: Denies swallowing difficulty, stomach pain, nausea or vomiting, diarrhea, constipation, bowel incontinence Gu:  Denies bladder incontinence, burning urine Ext:   Denies Joint pain, stiffness or swelling Skin: Denies  skin rash, easy bruising or bleeding or hives Endoc:  Denies polyuria, polydipsia , polyphagia or weight change Psych:   Denies depression, insomnia or hallucinations   Other:  All other systems negative   VS: BP (!) 126/94   Pulse 65   Temp 97.9 F (36.6 C)   Resp 20   Ht 5\' 2"  (1.575 m)   Wt 68.3 kg   SpO2 97%   BMI 27.54 kg/m       PHYSICAL EXAM  GENERAL:NAD, no fevers, chills, no weakness no fatigue HEAD: Normocephalic, atraumatic.  EYES: Pupils equal, round, reactive to light. Extraocular muscles intact. No scleral icterus.  MOUTH: Moist mucosal membrane. Dentition intact. No abscess noted.  EAR, NOSE, THROAT: Clear without exudates. No external lesions.  NECK: Supple. No thyromegaly. No nodules. No JVD.  PULMONARY: decreased breath sounds with mild rhonchi worse at bases bilaterally.  CARDIOVASCULAR: S1 and S2. Regular rate and rhythm. No murmurs, rubs, or gallops. No edema. Pedal pulses 2+ bilaterally.  GASTROINTESTINAL: Soft, nontender, nondistended. No masses. Positive bowel sounds. No hepatosplenomegaly.  MUSCULOSKELETAL: No swelling, clubbing, or edema. Range of motion full in all extremities.  NEUROLOGIC: Cranial nerves II through XII are intact. No gross focal neurological deficits. Sensation intact. Reflexes intact.  SKIN: No ulceration, lesions, rashes, or cyanosis. Skin warm and dry. Turgor intact.  PSYCHIATRIC: Mood, affect within normal limits. The patient is awake, alert and oriented x 3. Insight, judgment intact.       IMAGING     Narrative & Impression  CLINICAL DATA:  Exudative pleural effusion, history of atrial fibrillation, recent left atrial appendage occlusion device placed   EXAM: CT CHEST WITHOUT CONTRAST   TECHNIQUE: Multidetector CT imaging of the chest was performed following the standard protocol without IV contrast.   RADIATION DOSE REDUCTION: This exam was performed according to the departmental dose-optimization program which includes automated exposure control, adjustment of the mA and/or kV according to patient size and/or use of iterative reconstruction technique.   COMPARISON:  05/12/2022, 01/30/2022   FINDINGS: Cardiovascular: Unenhanced imaging of the heart demonstrates no evidence of cardiomegaly. There is moderate pericardial effusion, measuring up  to 1.5 cm in thickness. There is suggestion of possible pericardial thickening, though evaluation is limited without contrast. Correlation with echocardiography may be useful. Left atrial appendage occlusion device is identified. Normal caliber of the thoracic aorta. Atherosclerosis of the aorta and coronary vasculature. Evaluation of the vascular lumen is limited without IV contrast.   Mediastinum/Nodes: No enlarged mediastinal or axillary lymph nodes. Thyroid gland, trachea, and esophagus demonstrate no significant findings.   Lungs/Pleura: There are moderate bilateral pleural effusions, left greater than right. Dense areas of consolidation within the dependent lower lobes most consistent with atelectasis. There is some indeterminate opacification within the left upper lobe abutting the major fissure, which could reflect atelectasis or acute airspace disease. No pneumothorax. Central airways are patent.   Upper Abdomen: No acute abnormality.   Musculoskeletal: No acute displaced fractures. Postsurgical changes are seen from multilevel posterior thoracolumbar fusion. Reconstructed images demonstrate no additional findings.   IMPRESSION: 1. Moderate pericardial effusion, with possible associated pericardial thickening. Correlation with echocardiography is recommended. 2. Moderate bilateral pleural effusions, left greater than right. Dependent bilateral lower lobe consolidation favors atelectasis. Left upper lobe consolidation abutting the major fissure is indeterminate, and could reflect pneumonia or atelectasis. 3. Aortic Atherosclerosis (ICD10-I70.0). Coronary artery atherosclerosis.     Electronically Signed   By: Sharlet Salina M.D.   On: 05/13/2022 14:51    ASSESSMENT/PLAN   Bilateral pleural effusion with mixed neutrophil and lymphocyte predominant exudative profile       -this is a falsely exudative fluid - please treat as transudate secondary to CHF.  I have  reviewed cell count and differential , pleural fluid glucose, both serum and fluid LDH  Comfort measures    Bibasilar atelectasis    Now on comfort measures    Acute on chronic renal failure     - complicates diuresis of  pericardial and pleural fluid   Thank you for allowing me to participate in the care of this patient.   Patient/Family are satisfied with care plan and all questions have been answered.    Provider disclosure: Patient with at least one acute or chronic illness or injury that poses a threat to life or bodily function and is being managed actively during this encounter.  All of the below services have been performed independently by signing provider:  review of prior documentation from internal and or external health records.  Review of previous and current lab results.  Interview and comprehensive assessment during patient visit today. Review of current and previous chest radiographs/CT scans. Discussion of management and test interpretation with health care team and patient/family.   This document was prepared using Dragon voice recognition software and may include unintentional dictation errors.     Vida Rigger, M.D.  Division of Pulmonary & Critical Care Medicine

## 2022-05-17 NOTE — Progress Notes (Addendum)
PROGRESS NOTE    Molly Mitchell   ZOX:[id number] DOB: October 26, 1945  DOA: 05/02/2022 Date of Service: 05/08/2022 PCP: [facility]  Brief Narrative / Hospital Course:   Molly Mitchell is a 77 y.o. female with medical history significant of A-fib on Eliquis, s/p of Watchman left atrial appendage closure device placement on 04/20/22, hypertension, hyperlipidemia, asthma, diastolic CHF, stroke, CKD-3A, chronic pain, carotid artery stenosis, who presents to the ED from home 04/27/2022 with cough, shortness of breath, chest pain x2 days. 04/17: atrial fibrillation with RVR, heart rate up to 137. Required cardizem drip in ED. Negative PCR for COVID, flu and RSV, WBC 13.7, lactic acid 1.3, procalcitonin 0.19. Urinalysis (cloudy appearance, small amount of leukocyte, negative nitrite, WBC 0-5 with squamous cell contamination 21-50), slightly worsening renal function, BNP 716, trop 6 --> 5. Temperature normal, blood pressure 102/74, RR 25-37, oxygen saturation 97% on room air. Pt is admitted PCU as inpt. Tx for Afib RVR and sepsis d/t CAP.  04/18: significantly anxious, became over sedated w/ valium, low BP, improved in afternoon. Echo reviewed from Watchman device placement - TEE 04/20/22 LVEF 55-60% normal LV and RV fxn, LA mod dilated. Cardiology to adjust rate/rhythm control --> d/c dilt gtt, start po diltiazem, change coreg to metoprolol 12.5 mg po q6h, started amiodarone bolus/gtt. Remained very anxious - alprazolam lower dose administered.  04/19:  converted to sinus rhythm about mid-day. Concern worsening infiltrate on CXR, add azithromycin. 04/20: amio gtt d/c overnight d/t hypotension/bradycardia.CXR personally reviewed - infiltrate worse on L appears more c/w edema/vascular congestion, trial another dose lasix 40 mg IV diuresis. RVR recurred, restarted amiodarone gtt lower dose since had po amio also, BP a bit low so holding metoprolol po for now. Echo ordered.  04/21: Echo reviewed - EF 60-65%,  indeterminate diastolic parameters, enlarged RV, moderately dilated LA and RA, small pericardial effusion w/o tamponade. BP soft. Maintain amio gtt, added midodrine rather than add fluids.  04/22: Overnight HR to 146, given IV metoprolol and converted to sinus but hypotensive, got IV fluids, amio gtt held. Converted to sinus. Somnolent but rousable, reduced/adjusted sedating medications.  04/23: remains in/out Afib RVR. Increased metoprolol frequency. Cardiology hesitant on DCCV - may need repeat imaging to r/o thrombus with recent Watchman placement  4/24-4/25: Remains dyspneic, likely chf from a-fib, diuresing 4/26: Thoracentesis, 600 ml 4/28: Patient remains tachycardic in atrial fibrillation with rates up to 130s as well as tachypneic with respiratory rate in 20s and satting 95% on 5 L nasal cannula 4/29: patient states that she wants to be DNR/DNI and wants to be completely sedated and die as soon as possible. Her husband and daughter are at bedside and are accepting of this decision. Patient begs me to let her die repetitively. She also expressed these wishes to bedside nurses this morning. I confirmed with her that she would like to discontinue any life-prolonging treatments and to keep her comfortable for her to pass away of natural causes and she clearly stated that those were her wishes. Her daughter and husband also greed. Other specialists following were notified and patient is currently being transitioned to comfort care per her wishes.    Consultants:  Cardiology  Pulmonology    Procedures: Echo 4/20 Thoracentesis 4/26.  600 mL transudative fluids    ASSESSMENT & PLAN:  Principal Problem:   CAP (community acquired pneumonia) Active Problems:   Sepsis   Chronic diastolic CHF (congestive heart failure)   Chronic atrial fibrillation with RVR  Asthma   HTN (hypertension)   Chronic pain   HLD (hyperlipidemia)   Chronic kidney disease, stage 3a   Normocytic anemia   GOC- per  patient request and family agreement, patient transitioned to comfort care 4/29. All possible painful, anxiety provoking, invasive or life prolonging measures are being discontinued at this time. They would not like to be transferred home or to residential hospice. She would prefer a hospital death.  - wean oxygen - continuous morphine gtt and PRN boluses for air hunger and pain - anxiolytics PRN - other comfort measures per orders  Sepsis criteria without source of infection. Criteria from other means as listed below, possible CAP on admission.  S/p abx :IV Rocephin and doxycycline to cover PNA, and then  azithromycin   Chronic atrial fibrillation with acute RVR- Difficult to control. HR up to 150s.  - cardiology following, appreciate your recs Stopping Amiodarone PO, Metop and dilt as she is unable to tolerate PO and is requesting to stop all life-prolonging treatments Stopping Eliquis   Acute hypoxic respiratory failure  Pleural effusions - s/p thoracentesis with transudate fluid on 4/26. Currently appears uncomfortable and on 5L [place]. Poor prognosis. Family declines palliative talks. Not on oxygen supplement at baseline. - morphine for air hunger - pulmonology following, appreciate your recs - wean oxygen once comfortable on morphine gtt   HFpEF w/ exacerbation  pericardial effusion.  2D echo on 04/20/2022 showed EF of 55-60% and mild effusion. Chest CT 4/27 graded effusion as moderate.   Severe anxiety  Insomnia Sensitivity to benzodiazepine (oversedation w/ Valium)  Scheduled BuSpar, declines ssri Continue trazodone nightly for insomnia Atarax TID   Nausea Controlled w/ current regimen   Asthma Bronchodilators   Hypotension - Midodrine discontinued   HLD (hyperlipidemia) Pravastatin stopped   AKI on Chronic kidney disease, stage 3a:   Normocytic anemia: hgb drop today but no obvious bleed.  Mild, no active bleeding.  Carotid stenosis S/p endarterectomy  09/2021  End-of-life care Patient has been bedbound since back surgery in January, now with difficult to control a-fib, pleural effusions, hypoxic respiratory failure. - poor prognosis. Patient transitioned to comfort care at this time per her wishes and expect hospital death.    DVT prophylaxis: none   Code Status: DNR/DNI ACP documents reviewed: none on file    Current Admission Status: comfort care TOC needs / Dispo plan: hospital death Barriers to discharge / significant pending items: remains hypoxic and dyspneic  Subjective / Brief ROS: Complains of significant pain everywhere and intolerable SOB. She would like to die. Begs me to completely sedate her.   Family Communication: husband and daughter updated @ bedside  Objective Findings:  Vitals:   04/24/2022 0401 04/22/2022 0441 04/22/2022 0447 04/26/2022 0615  BP:    (!) 126/94  Pulse:      Resp:      Temp:  97.9 F (36.6 C)    TempSrc:      SpO2: 97%     Weight:   68.3 kg   Height:        Intake/Output Summary (Last 24 hours) at 05/16/2022 0740 Last data filed at 05/07/2022 0200 Gross per 24 hour  Intake --  Output 2 ml  Net -2 ml     Filed Weights   05/12/22 2345 05/14/22 0700 05/12/2022 0447  Weight: 69.5 kg 68 kg 68.3 kg    Examination:  General: in moderate distress, laying eyes closed.  Cardiac: irregular tachycardic, normal heart sounds, no murmurs. 2+  radial and PT pulses bilaterally Respiratory: CTAB, increased effort and rate of breaths. Not conversational. On 5L [place]. No wheezes, rales or rhonchi Extremities: no edema. WWP. Skin: warm and dry, no rashes noted Neuro: alert and oriented, no focal deficits Psych: anxious affect and mood  Scheduled Medications:   albuterol  5 mg Nebulization Once   amiodarone  400 mg Oral BID   Followed by   Melene Muller ON 05/18/2022] amiodarone  200 mg Oral Daily   apixaban  5 mg Oral BID   busPIRone  5 mg Oral TID   diltiazem  120 mg Oral Daily   feeding supplement  237 mL  Oral BID AC   magnesium oxide  800 mg Oral Daily   metoprolol tartrate  25 mg Oral TID   midodrine  10 mg Oral TID WC    morphine injection  1 mg Intravenous Q8H   pravastatin  40 mg Oral QHS   tiZANidine  2 mg Oral TID   traZODone  50 mg Oral QHS    Continuous Infusions:  promethazine (PHENERGAN) injection (IM or IVPB) 6.25 mg (05/09/22 1821)    PRN Medications:  acetaminophen, albuterol, dextromethorphan-guaiFENesin, HYDROcodone-acetaminophen, hydrOXYzine, loratadine, melatonin, menthol-cetylpyridinium, ondansetron (ZOFRAN) IV, prochlorperazine, promethazine (PHENERGAN) injection (IM or IVPB), sodium chloride  Antimicrobials from admission:  Anti-infectives (From admission, onward)    Start     Dose/Rate Route Frequency Ordered Stop   05/08/22 1415  azithromycin (ZITHROMAX) tablet 500 mg        500 mg Oral Daily 05/08/22 1324 05/09/22 0809   05/05/22 1600  azithromycin (ZITHROMAX) 500 mg in sodium chloride 0.9 % 250 mL IVPB  Status:  Discontinued        500 mg 250 mL/hr over 60 Minutes Intravenous Every 24 hours 05/05/22 1515 05/08/22 1324   05/11/2022 1900  doxycycline (VIBRA-TABS) tablet 100 mg  Status:  Discontinued        100 mg Oral Every 12 hours 05/06/2022 1129 05/07/22 1219   05/09/2022 1200  cefTRIAXone (ROCEPHIN) 2 g in sodium chloride 0.9 % 100 mL IVPB        2 g 200 mL/hr over 30 Minutes Intravenous Every 24 hours 05/01/2022 1129 05/09/22 1306   05/10/2022 0600  doxycycline (VIBRAMYCIN) 100 mg in sodium chloride 0.9 % 250 mL IVPB        100 mg 125 mL/hr over 120 Minutes Intravenous  Once 05/02/2022 0459 05/16/2022 0810       Data Reviewed:  I have personally reviewed the following...  CBC: Recent Labs  Lab 05/10/22 0504 05/14/22 0341 05/04/2022 0455  WBC 13.3* 16.3* 14.4*  HGB 9.7* 8.2* 8.8*  HCT 30.7* 26.3* 27.4*  MCV 92.5 90.4 88.4  PLT 411* 350 355    Basic Metabolic Panel: Recent Labs  Lab 05/09/22 0407 05/10/22 0504 05/11/22 0616 05/12/22 0400  05/12/22 0853 05/13/22 0415 05/14/22 0341 05/16/2022 0455  NA 138   < > 141 140  --  137 135 135  K 3.9   < > 3.9 5.4* 5.1 5.1 5.2* 4.3  CL 100   < > 101 99  --  98 97* 95*  CO2 27   < > 30 31  --  29 29 27   GLUCOSE 124*   < > 125* 124*  --  120* 127* 148*  BUN 30*   < > 35* 48*  --  54* 52* 39*  CREATININE 1.24*   < > 1.11* 1.64*  --  1.34* 1.22* 1.01*  CALCIUM  8.8*   < > 9.4 9.4  --  9.2 8.8* 8.6*  MG 2.0  --  1.8  --   --   --   --   --    < > = values in this interval not displayed.    GFR: Estimated Creatinine Clearance: 42.9 mL/min (A) (by C-G formula based on SCr of 1.01 mg/dL (H)). Liver Function Tests: Recent Labs  Lab 05/12/22 0400  AST 29  ALT 26  ALKPHOS 187*  BILITOT 0.4  PROT 6.7  ALBUMIN 3.1*    Most Recent Urinalysis On File:     Component Value Date/Time   COLORURINE YELLOW (A) 05/02/2022 0811   APPEARANCEUR CLOUDY (A) 05/02/2022 0811   APPEARANCEUR Clear 02/19/2013 0916   LABSPEC 1.018 04/29/2022 0811   LABSPEC 1.010 02/19/2013 0916   PHURINE 5.0 05/06/2022 0811   GLUCOSEU NEGATIVE 05/10/2022 0811   GLUCOSEU Negative 02/19/2013 0916   HGBUR NEGATIVE 04/26/2022 0811   BILIRUBINUR NEGATIVE 05/10/2022 0811   BILIRUBINUR negative 10/23/2017 1324   BILIRUBINUR Negative 02/19/2013 0916   KETONESUR NEGATIVE 05/10/2022 0811   PROTEINUR 30 (A) 04/19/2022 0811   UROBILINOGEN 0.2 10/23/2017 1324   NITRITE NEGATIVE 04/28/2022 0811   LEUKOCYTESUR SMALL (A) 04/25/2022 0811   LEUKOCYTESUR Negative 02/19/2013 0916   Sepsis Labs: @LABRCNTIP (procalcitonin:4,lacticidven:4) Microbiology: Recent Results (from the past 240 hour(s))  Body fluid culture w Gram Stain     Status: None (Preliminary result)   Collection Time: 05/12/22  3:38 PM   Specimen: Pleura; Body Fluid  Result Value Ref Range Status   Specimen Description   Final    PLEURAL Performed at [facility], [address] [place], [place] [address]    Special Requests   Final     NONE Performed at [facility], [address] [place], [place] [address]    Gram Stain   Final    FEW WBC PRESENT, PREDOMINANTLY PMN NO ORGANISMS SEEN    Culture   Final    NO GROWTH < 24 HOURS Performed at [facility], [address] [place], [place] [address]    Report Status PENDING  Incomplete    Radiology Studies last 3 days: CT CHEST WO CONTRAST  Result Date: 05/13/2022 CLINICAL DATA:  Exudative pleural effusion, history of atrial fibrillation, recent left atrial appendage occlusion device placed EXAM: CT CHEST WITHOUT CONTRAST TECHNIQUE: Multidetector CT imaging of the chest was performed following the standard protocol without IV contrast. RADIATION DOSE REDUCTION: This exam was performed according to the departmental dose-optimization program which includes automated exposure control, adjustment of the mA and/or kV according to patient size and/or use of iterative reconstruction technique. COMPARISON:  05/12/2022, 01/30/2022 FINDINGS: Cardiovascular: Unenhanced imaging of the heart demonstrates no evidence of cardiomegaly. There is moderate pericardial effusion, measuring up to 1.5 cm in thickness. There is suggestion of possible pericardial thickening, though evaluation is limited without contrast. Correlation with echocardiography may be useful. Left atrial appendage occlusion device is identified. Normal caliber of the thoracic aorta. Atherosclerosis of the aorta and coronary vasculature. Evaluation of the vascular lumen is limited without IV contrast. Mediastinum/Nodes: No enlarged mediastinal or axillary lymph nodes. Thyroid gland, trachea, and esophagus demonstrate no significant findings. Lungs/Pleura: There are moderate bilateral pleural effusions, left greater than right. Dense areas of consolidation within the dependent lower lobes most consistent with atelectasis. There is some indeterminate opacification within the left upper lobe abutting the major fissure,  which could reflect atelectasis or acute airspace disease.  No pneumothorax. Central airways are patent. Upper Abdomen: No acute abnormality. Musculoskeletal: No acute displaced fractures. Postsurgical changes are seen from multilevel posterior thoracolumbar fusion. Reconstructed images demonstrate no additional findings. IMPRESSION: 1. Moderate pericardial effusion, with possible associated pericardial thickening. Correlation with echocardiography is recommended. 2. Moderate bilateral pleural effusions, left greater than right. Dependent bilateral lower lobe consolidation favors atelectasis. Left upper lobe consolidation abutting the major fissure is indeterminate, and could reflect pneumonia or atelectasis. 3. Aortic Atherosclerosis (ICD10-I70.0). Coronary artery atherosclerosis. Electronically Signed   By: Sharlet Salina M.D.   On: 05/13/2022 14:51   US THORACENTESIS ASP PLEURAL SPACE W/IMG GUIDE  Result Date: 05/12/2022 INDICATION: History of AFib on Eliquis, status post Watchman left atrial appendage closure device placed on 04/20/2022, HTN, asthma, CHF and CKD stage 3. Patient complains of cough and shortness of breath with chest pain x2 days. Imaging found patient to have increase in left pleural effusion. Patient was referred to IR for diagnostic and therapeutic left thoracentesis. EXAM: ULTRASOUND GUIDED DIAGNOSTIC AND THERAPEUTIC LEFT THORACENTESIS MEDICATIONS: 15 mL 1% lidocaine COMPLICATIONS: None immediate. PROCEDURE: An ultrasound guided thoracentesis was thoroughly discussed with the patient and questions answered. The benefits, risks, alternatives and complications were also discussed. The patient understands and wishes to proceed with the procedure. Written consent was obtained. Ultrasound was performed to localize and mark an adequate pocket of fluid in the left chest. The area was then prepped and draped in the normal sterile fashion. 1% Lidocaine was used for local anesthesia. Under ultrasound  guidance a 6 Fr Safe-T-Centesis catheter was introduced. Thoracentesis was performed. The catheter was removed and a dressing applied. FINDINGS: A total of approximately 600 cc of clear, amber fluid was removed. Samples were sent to the laboratory as requested by the clinical team. IMPRESSION: Successful ultrasound guided left thoracentesis yielding 600 cc of pleural fluid. Electronically Signed   By: Malachy Moan M.D.   On: 05/12/2022 16:17   DG Chest Port 1 View  Result Date: 05/12/2022 CLINICAL DATA:  Pleural effusion, status post left thoracentesis EXAM: PORTABLE CHEST 1 VIEW COMPARISON:  05/11/2022 FINDINGS: Marked improvement in left pleural effusion with no residual blunting of the left lateral costophrenic angle. No pneumothorax or complicating feature. Moderate enlargement of the cardiopericardial silhouette noted. Atherosclerotic calcification of the aortic arch. Continued blunting of the right costophrenic angle and mild hazy density at the right lung base likely from right pleural fluid. Stable thoracolumbar fixation hardware. Stable moderate degenerative glenohumeral arthropathy. Stable lower cervical fixation hardware. Left atrial occluder device noted. IMPRESSION: 1. Marked improvement in left pleural effusion status post thoracentesis. No pneumothorax. 2. Moderate enlargement of the cardiopericardial silhouette. 3. Small right pleural effusion. 4. Moderate degenerative glenohumeral arthropathy. 5.  Aortic Atherosclerosis (ICD10-I70.0). Electronically Signed   By: Gaylyn Rong M.D.   On: 05/12/2022 16:07   DG Chest Port 1 View  Result Date: 05/11/2022 CLINICAL DATA:  Dyspnea EXAM: PORTABLE CHEST 1 VIEW COMPARISON:  X-ray 05/06/2022 and older FINDINGS: Enlarged cardiopericardial silhouette with calcified tortuous aorta. Intra-atrial appendage occlusion device. Extensive hardware along the spine. Enlarged heart with increasing left effusion and opacity. Trace right effusion. No  pneumothorax. Overlapping cardiac leads IMPRESSION: Increasing left-sided pleural effusion and opacity. Recommend follow-up Electronically Signed   By: Karen Kays M.D.   On: 05/11/2022 15:24     LOS: 12 days   Leeroy Bock, MD [facility] 04/27/2022, 7:40 AM    Staff may message me via secure chat in Epic  but this may  not receive an immediate response,  please page me for urgent matters!  If 7PM-7AM, please contact night coverage [email or web address]

## 2022-05-17 NOTE — Progress Notes (Signed)
       CROSS COVER NOTE  NAME: AKEMI OVERHOLSER MRN: [id number] DOB : 10/30/1945 ATTENDING PHYSICIAN: Leeroy Bock, MD   Message received from Santa Lighter RN "Admitted for poss CAP, pleural effusion, afib rvr. Patient has history of severe anxiety and having anxiety attack right now. She got her trazadone and atarax around 2100. Said she hasn't slept at all. They tried valium but she was oversedated with that. Next time she can have her atarax is at 0500. Can she get something else in the meantime? "  After I clarified that the order is TID and not q8H Santa Lighter RN states M(r)s [place] feels Hydroxyzine does not seem to help.  Per chart review M(r)s [place]a has a sensitivity to benzodiazepines (Xanax and Valium trialed this admission) and has declined SSRIs. She is currently ordered TID Buspar and PRN TID Hydroxyzine.  Plan: - Low dose Seroquel 12.5 mg x1 - Educate patient on relaxation techniques - Utilize Mindfulness channel on in room TV  To reach the provider On-Call:   7AM- 7PM see care teams to locate the attending and reach out to them via [email or web address] Password: [email or web address] 7PM-7AM contact night-coverage If you still have difficulty reaching the appropriate provider, please page the [facility] (Director on Call) for [facility] on amion for assistance  This document was prepared using Conservation officer, historic buildings and may include unintentional dictation errors.  Bishop Limbo DNP, MBA, FNP-BC, PMHNP-BC Nurse Practitioner [facility] Pager [phone]

## 2022-05-17 NOTE — Progress Notes (Signed)
PT Cancellation Note  Patient Details Name: GRACEANNE GUIN MRN: [id number] DOB: 17-May-1945   Cancelled Treatment:    Reason Eval/Treat Not Completed: Other (comment).  PT consult cancelled by MD Dareen Piano.  Hendricks Limes, PT 05/01/2022, 8:59 AM

## 2022-05-17 NOTE — Progress Notes (Signed)
Patient is very tearful and expressing her wish to die.  She has been very uncomfortable and wanting to be out of pain.  She has been asking for an increase in her morphine all morning.  Morphine gtt initially started at 1mg /hr with no relief.  Boluses given.  MD notified and came to speak with the family.  Morphine gtt then adjusted to 5mg /hr.  Patient still not receiving any relief from the medication. MD notified again and rate increased to 10mg /hr.  Family present at bedside and trying to help comfort patient.

## 2022-05-17 NOTE — Progress Notes (Signed)
Patient (and family) still saying she is not comfortable.  Have been giving boluses q15 minutes for the past hour.  Family asking if we can increase morphine gtt again.  MD notified and states she will need to come and reevaluate patient.  Family updated.

## 2022-05-17 NOTE — Progress Notes (Signed)
OT Cancellation Note  Patient Details Name: Molly Mitchell MRN: [id number] DOB: 10/14/1945   Cancelled Treatment:    Reason Eval/Treat Not Completed: Other (comment). Acknowledge order DC. Please re-consult if pt GOC change/medically indicated.   Rockney Ghee, M.S., OTR/L 04/22/2022, 9:08 AM

## 2022-05-17 NOTE — Progress Notes (Signed)
Patient is breathless and with no pulse. MD notified. Patient family is at bedside. Chaplain called per family request.

## 2022-05-17 NOTE — Death Summary Note (Signed)
DEATH SUMMARY   Patient Details  Name: Molly Mitchell MRN: [id number] DOB: [birth year] [id number] Admission/Discharge Information   Admit Date:  2022-05-12  Date of Death: Date of Death: 2022/05/24  Time of Death: Time of Death: 1335  Length of Stay: 06-06-2022   Principle Cause of death: acute respiratory failure and atrial fibrillation.   Hospital Diagnoses: Principal Problem:   Chronic atrial fibrillation with RVR (HCC) Active Problems:   CAP (community acquired pneumonia)   Sepsis (HCC)   Chronic diastolic CHF (congestive heart failure) (HCC)   Asthma   HTN (hypertension)   Chronic pain   HLD (hyperlipidemia)   Chronic kidney disease, stage 3a (HCC)   Normocytic anemia   AKI (acute kidney injury) (HCC)   Status post thoracentesis   Acute respiratory failure with hypoxia (HCC)   Chest pain   Atrial fibrillation with rapid ventricular response [facility])   Hospital Course: Molly Mitchell is a 77 y.o. female with medical history significant of A-fib on Eliquis, s/p of Watchman left atrial appendage closure device placement on 04/20/22, hypertension, hyperlipidemia, asthma, diastolic CHF, stroke, CKD-3A, chronic pain, carotid artery stenosis, who presents to the ED from home 05/12/22 with cough, shortness of breath, chest pain x2 days. 12-May-2022: atrial fibrillation with RVR, heart rate up to 137. Required cardizem drip in ED. Negative PCR for COVID, flu and RSV, WBC 13.7, lactic acid 1.3, procalcitonin 0.19. Urinalysis (cloudy appearance, small amount of leukocyte, negative nitrite, WBC 0-5 with squamous cell contamination 21-50), slightly worsening renal function, BNP 716, trop 6 --> 5. Temperature normal, blood pressure 102/74, RR 25-37, oxygen saturation 97% on room air. Pt is admitted PCU as inpt. Tx for Afib RVR and sepsis d/t CAP.  04/18: significantly anxious, became over sedated w/ valium, low BP, improved in afternoon. Echo reviewed from Watchman device placement - TEE  04/20/22 LVEF 55-60% normal LV and RV fxn, LA mod dilated. Cardiology to adjust rate/rhythm control --> d/c dilt gtt, start po diltiazem, change coreg to metoprolol 12.5 mg po q6h, started amiodarone bolus/gtt. Remained very anxious - alprazolam lower dose administered.  04/19:  converted to sinus rhythm about mid-day. Concern worsening infiltrate on CXR, add azithromycin. 04/20: amio gtt d/c overnight d/t hypotension/bradycardia.CXR personally reviewed - infiltrate worse on L appears more c/w edema/vascular congestion, trial another dose lasix 40 mg IV diuresis. RVR recurred, restarted amiodarone gtt lower dose since had po amio also, BP a bit low so holding metoprolol po for now. Echo ordered.  04/21: Echo reviewed - EF 60-65%, indeterminate diastolic parameters, enlarged RV, moderately dilated LA and RA, small pericardial effusion w/o tamponade. BP soft. Maintain amio gtt, added midodrine rather than add fluids.  04/22: Overnight HR to 146, given IV metoprolol and converted to sinus but hypotensive, got IV fluids, amio gtt held. Converted to sinus. Somnolent but rousable, reduced/adjusted sedating medications.  04/23: remains in/out Afib RVR. Increased metoprolol frequency. Cardiology hesitant on DCCV - may need repeat imaging to r/o thrombus with recent Watchman placement  4/24-4/25: Remains dyspneic, likely chf from a-fib, diuresing 4/26: Thoracentesis, 600 ml 4/28: Patient remains tachycardic in atrial fibrillation with rates up to 130s as well as tachypneic with respiratory rate in 20s and satting 95% on 5 L nasal cannula 05-24-2022: patient states that she wants to be DNR/DNI and wants to be completely sedated and die as soon as possible. Her husband and daughter are at bedside and are accepting of this decision. Patient repetitively begs me to let her  die. She also expressed these wishes to bedside nurses this morning. I confirmed with her that she would like to discontinue any life-prolonging treatments  and to keep her comfortable for her to pass away of natural causes and she clearly stated that those were her wishes. Her daughter and husband also agreed. Other specialists following were notified and patient is currently being transitioned to comfort care per her wishes. Afternoon 4/29 update: patient remained very uncomfortable with back pain, respiratory distress, and chest pain. She continued to urgently request more doses of pain medications and anxiety medications. She and her daughter expressed that they did not want to transfer home or to to hospice house. They wanted a hospital death. She was made comfortable with morphine drip and ativan. She passed away shortly after with her daughter and husband at bedside.   Procedures: echo, thoracentesis   Consultations: cardiology, pulmonology   The results of significant diagnostics from this hospitalization (including imaging, microbiology, ancillary and laboratory) are listed below for reference.   Significant Diagnostic Studies: CT CHEST WO CONTRAST  Result Date: 05/13/2022 CLINICAL DATA:  Exudative pleural effusion, history of atrial fibrillation, recent left atrial appendage occlusion device placed EXAM: CT CHEST WITHOUT CONTRAST TECHNIQUE: Multidetector CT imaging of the chest was performed following the standard protocol without IV contrast. RADIATION DOSE REDUCTION: This exam was performed according to the departmental dose-optimization program which includes automated exposure control, adjustment of the mA and/or kV according to patient size and/or use of iterative reconstruction technique. COMPARISON:  05/12/2022, 01/30/2022 FINDINGS: Cardiovascular: Unenhanced imaging of the heart demonstrates no evidence of cardiomegaly. There is moderate pericardial effusion, measuring up to 1.5 cm in thickness. There is suggestion of possible pericardial thickening, though evaluation is limited without contrast. Correlation with echocardiography may be  useful. Left atrial appendage occlusion device is identified. Normal caliber of the thoracic aorta. Atherosclerosis of the aorta and coronary vasculature. Evaluation of the vascular lumen is limited without IV contrast. Mediastinum/Nodes: No enlarged mediastinal or axillary lymph nodes. Thyroid gland, trachea, and esophagus demonstrate no significant findings. Lungs/Pleura: There are moderate bilateral pleural effusions, left greater than right. Dense areas of consolidation within the dependent lower lobes most consistent with atelectasis. There is some indeterminate opacification within the left upper lobe abutting the major fissure, which could reflect atelectasis or acute airspace disease. No pneumothorax. Central airways are patent. Upper Abdomen: No acute abnormality. Musculoskeletal: No acute displaced fractures. Postsurgical changes are seen from multilevel posterior thoracolumbar fusion. Reconstructed images demonstrate no additional findings. IMPRESSION: 1. Moderate pericardial effusion, with possible associated pericardial thickening. Correlation with echocardiography is recommended. 2. Moderate bilateral pleural effusions, left greater than right. Dependent bilateral lower lobe consolidation favors atelectasis. Left upper lobe consolidation abutting the major fissure is indeterminate, and could reflect pneumonia or atelectasis. 3. Aortic Atherosclerosis (ICD10-I70.0). Coronary artery atherosclerosis. Electronically Signed   By: Sharlet Salina M.D.   On: 05/13/2022 14:51   US THORACENTESIS ASP PLEURAL SPACE W/IMG GUIDE  Result Date: 05/12/2022 INDICATION: History of AFib on Eliquis, status post Watchman left atrial appendage closure device placed on 04/20/2022, HTN, asthma, CHF and CKD stage 3. Patient complains of cough and shortness of breath with chest pain x2 days. Imaging found patient to have increase in left pleural effusion. Patient was referred to IR for diagnostic and therapeutic left  thoracentesis. EXAM: ULTRASOUND GUIDED DIAGNOSTIC AND THERAPEUTIC LEFT THORACENTESIS MEDICATIONS: 15 mL 1% lidocaine COMPLICATIONS: None immediate. PROCEDURE: An ultrasound guided thoracentesis was thoroughly discussed with the patient and questions answered.  The benefits, risks, alternatives and complications were also discussed. The patient understands and wishes to proceed with the procedure. Written consent was obtained. Ultrasound was performed to localize and mark an adequate pocket of fluid in the left chest. The area was then prepped and draped in the normal sterile fashion. 1% Lidocaine was used for local anesthesia. Under ultrasound guidance a 6 Fr Safe-T-Centesis catheter was introduced. Thoracentesis was performed. The catheter was removed and a dressing applied. FINDINGS: A total of approximately 600 cc of clear, amber fluid was removed. Samples were sent to the laboratory as requested by the clinical team. IMPRESSION: Successful ultrasound guided left thoracentesis yielding 600 cc of pleural fluid. Electronically Signed   By: Malachy Moan M.D.   On: 05/12/2022 16:17   DG Chest Port 1 View  Result Date: 05/12/2022 CLINICAL DATA:  Pleural effusion, status post left thoracentesis EXAM: PORTABLE CHEST 1 VIEW COMPARISON:  05/11/2022 FINDINGS: Marked improvement in left pleural effusion with no residual blunting of the left lateral costophrenic angle. No pneumothorax or complicating feature. Moderate enlargement of the cardiopericardial silhouette noted. Atherosclerotic calcification of the aortic arch. Continued blunting of the right costophrenic angle and mild hazy density at the right lung base likely from right pleural fluid. Stable thoracolumbar fixation hardware. Stable moderate degenerative glenohumeral arthropathy. Stable lower cervical fixation hardware. Left atrial occluder device noted. IMPRESSION: 1. Marked improvement in left pleural effusion status post thoracentesis. No pneumothorax.  2. Moderate enlargement of the cardiopericardial silhouette. 3. Small right pleural effusion. 4. Moderate degenerative glenohumeral arthropathy. 5.  Aortic Atherosclerosis (ICD10-I70.0). Electronically Signed   By: Gaylyn Rong M.D.   On: 05/12/2022 16:07   DG Chest Port 1 View  Result Date: 05/11/2022 CLINICAL DATA:  Dyspnea EXAM: PORTABLE CHEST 1 VIEW COMPARISON:  X-ray 05/06/2022 and older FINDINGS: Enlarged cardiopericardial silhouette with calcified tortuous aorta. Intra-atrial appendage occlusion device. Extensive hardware along the spine. Enlarged heart with increasing left effusion and opacity. Trace right effusion. No pneumothorax. Overlapping cardiac leads IMPRESSION: Increasing left-sided pleural effusion and opacity. Recommend follow-up Electronically Signed   By: Karen Kays M.D.   On: 05/11/2022 15:24   ECHOCARDIOGRAM COMPLETE  Result Date: 05/06/2022    ECHOCARDIOGRAM REPORT   Patient Name:   CHARLOTTE FIDALGO Date of Exam: 05/06/2022 Medical Rec #:  [id number]      Height:       62.0 in Accession #:    [id number]     Weight:       149.9 lb Date of Birth:  July 01, 1945       BSA:          1.691 m Patient Age:    77 years       BP:           95/65 mmHg Patient Gender: F              HR:           107 bpm. Exam Location:  [facility] Procedure: 2D Echo Indications:     Atrial Fibrillation I48.91  History:         Patient has prior history of Echocardiogram examinations, most                  recent 04/20/2022.  Sonographer:     Overton Mam RDCS, FASE Referring Phys:  Lajuana Carry A KHAN Diagnosing Phys: Adrian Blackwater  Sonographer Comments: Technically difficult study due to poor echo windows. Image acquisition challenging due to respiratory motion. The patient  had coughing spells throughout this study. IMPRESSIONS  1. Left ventricular ejection fraction, by estimation, is 60 to 65%. The left ventricle has normal function. The left ventricle has no regional wall motion abnormalities. There is mild  concentric left ventricular hypertrophy. Left ventricular diastolic parameters are indeterminate.  2. Right ventricular systolic function is normal. The right ventricular size is moderately enlarged.  3. Left atrial size was moderately dilated.  4. Right atrial size was moderately dilated.  5. A small pericardial effusion is present. The pericardial effusion is circumferential. There is no evidence of cardiac tamponade.  6. The mitral valve is normal in structure. Mild mitral valve regurgitation. No evidence of mitral stenosis.  7. The aortic valve is normal in structure. Aortic valve regurgitation is not visualized. No aortic stenosis is present.  8. The inferior vena cava is normal in size with greater than 50% respiratory variability, suggesting right atrial pressure of 3 mmHg. FINDINGS  Left Ventricle: Left ventricular ejection fraction, by estimation, is 60 to 65%. The left ventricle has normal function. The left ventricle has no regional wall motion abnormalities. The left ventricular internal cavity size was normal in size. There is  mild concentric left ventricular hypertrophy. Left ventricular diastolic parameters are indeterminate. Right Ventricle: The right ventricular size is moderately enlarged. No increase in right ventricular wall thickness. Right ventricular systolic function is normal. Left Atrium: Left atrial size was moderately dilated. Right Atrium: Right atrial size was moderately dilated. Pericardium: A small pericardial effusion is present. The pericardial effusion is circumferential. There is no evidence of cardiac tamponade. Mitral Valve: The mitral valve is normal in structure. Mild mitral valve regurgitation. No evidence of mitral valve stenosis. Tricuspid Valve: The tricuspid valve is normal in structure. Tricuspid valve regurgitation is mild . No evidence of tricuspid stenosis. Aortic Valve: The aortic valve is normal in structure. Aortic valve regurgitation is not visualized. No aortic  stenosis is present. Aortic valve peak gradient measures 10.9 mmHg. Pulmonic Valve: The pulmonic valve was normal in structure. Pulmonic valve regurgitation is not visualized. No evidence of pulmonic stenosis. Aorta: The aortic root is normal in size and structure. Venous: The inferior vena cava is normal in size with greater than 50% respiratory variability, suggesting right atrial pressure of 3 mmHg. IAS/Shunts: No atrial level shunt detected by color flow Doppler.  LEFT VENTRICLE PLAX 2D LVIDd:         3.80 cm   Diastology LVIDs:         2.70 cm   LV e' medial:    13.40 cm/s LV PW:         1.00 cm   LV E/e' medial:  6.7 LV IVS:        1.00 cm   LV e' lateral:   8.27 cm/s LVOT diam:     1.80 cm   LV E/e' lateral: 10.8 LV SV:         58 LV SV Index:   34 LVOT Area:     2.54 cm  RIGHT VENTRICLE RV Basal diam:  2.80 cm RV S prime:     12.10 cm/s TAPSE (M-mode): 1.0 cm LEFT ATRIUM           Index        RIGHT ATRIUM           Index LA diam:      3.60 cm 2.13 cm/m   RA Area:     16.20 cm LA Vol (A4C): 30.2 ml 17.86 ml/m  RA Volume:   45.30 ml  26.78 ml/m  AORTIC VALVE                 PULMONIC VALVE AV Area (Vmax): 1.77 cm     PV Vmax:        1.04 m/s AV Vmax:        165.00 cm/s  PV Peak grad:   4.3 mmHg AV Peak Grad:   10.9 mmHg    RVOT Peak grad: 3 mmHg LVOT Vmax:      115.00 cm/s LVOT Vmean:     75.800 cm/s LVOT VTI:       0.226 m  AORTA Ao Root diam: 3.00 cm Ao Asc diam:  3.20 cm MITRAL VALVE               TRICUSPID VALVE MV Area (PHT): 2.94 cm    TR Peak grad:   22.3 mmHg MV Decel Time: 258 msec    TR Vmax:        236.00 cm/s MV E velocity: 89.60 cm/s                            SHUNTS                            Systemic VTI:  0.23 m                            Systemic Diam: 1.80 cm Adrian Blackwater Electronically signed by Adrian Blackwater Signature Date/Time: 05/06/2022/11:04:36 PM    Final    DG Chest Port 1 View  Result Date: 05/06/2022 CLINICAL DATA:  Shortness of breath EXAM: PORTABLE CHEST 1 VIEW  COMPARISON:  05/05/2018 FINDINGS: Cardiac shadow is enlarged but stable. Watchman device is again seen. Postsurgical changes in the thoracolumbar spine are noted. Slight increased central vascular congestion is noted with mild interstitial edema. Small effusions are noted bilaterally. IMPRESSION: Slight increase in vascular congestion with mild interstitial edema. Small effusions are again noted. Electronically Signed   By: Alcide Clever M.D.   On: 05/06/2022 09:29   DG Chest Port 1 View  Result Date: 05/05/2022 CLINICAL DATA:  Shortness of breath. EXAM: PORTABLE CHEST 1 VIEW COMPARISON:  04/20/2022. FINDINGS: Increasing bibasilar opacities, left-greater-than-right, suspicious for aspiration or multifocal pneumonia. Unchanged small left pleural effusion. Stable cardiac and mediastinal contours. No pneumothorax. IMPRESSION: Increasing bibasilar opacities, greater on the left, suspicious for aspiration or multifocal pneumonia. Electronically Signed   By: Orvan Falconer M.D.   On: 05/05/2022 13:20   DG Chest Port 1 View  Result Date: 05/16/2022 CLINICAL DATA:  Chest pain EXAM: PORTABLE CHEST 1 VIEW COMPARISON:  04/20/2022 FINDINGS: Mild bilateral lower lobe opacities, left greater than right, atelectasis versus pneumonia. Small left pleural effusion, new. No frank interstitial edema. Cardiomegaly. Thoracolumbar spine fixation hardware. Cervical spine fixation hardware, incompletely visualized. IMPRESSION: Mild bilateral lower lobe opacities, left greater than right, atelectasis versus pneumonia. Small left pleural effusion. Electronically Signed   By: Charline Bills M.D.   On: 04/22/2022 03:37   DG Chest 2 View  Result Date: 04/22/2022 CLINICAL DATA:  77 year old female, preoperative evaluation. EXAM: CHEST - 2 VIEW COMPARISON:  10/13/2013, 01/30/2022 FINDINGS: The mediastinal contours are within normal limits. No cardiomegaly. The lungs are clear bilaterally without evidence of focal consolidation,  pleural effusion, or pneumothorax. Multilevel, incompletely visualized thoracolumbar fusion hardware  in place. Lower cervical ACDF hardware in place. No acute osseous abnormality. IMPRESSION: No acute cardiopulmonary process. Electronically Signed   By: Marliss Coots M.D.   On: 04/22/2022 11:34   EP STUDY  Result Date: 04/20/2022 CONCLUSIONS: 1.Successful implantation of a WATCHMAN left atrial appendage occlusive device   2. TEE demonstrating no LAA thrombus 3. No early apparent complications. Post Implant Anticoagulation Strategy: Continue Eliquis 5mg  PO BID x 45 days after implant. After 45 days, stop Eliquis and start Plavix 75mg  PO daily to complete 6 months of post implant therapy. Plan for CT scan 60 days after implant to assess Watchman position.   ECHO TEE  Result Date: 04/20/2022    TRANSESOPHOGEAL ECHO REPORT   Patient Name:   NORINNE JEANE Date of Exam: 04/20/2022 Medical Rec #:  [id number]      Height:       62.0 in Accession #:    [id number]     Weight:       145.0 lb Date of Birth:  06-Sep-1945       BSA:          1.667 m Patient Age:    77 years       BP:           158/56 mmHg Patient Gender: F              HR:           76 bpm. Exam Location:  Inpatient Procedure: Transesophageal Echo, 3D Echo, Color Doppler and Cardiac Doppler Indications:     I48.2 Chronic atrial fibrillation  History:         Patient has prior history of Echocardiogram examinations, most                  recent 09/20/2021. Arrythmias:Atrial Fibrillation; Risk                  Factors:Hypertension and Dyslipidemia.  Sonographer:     Irving Burton Senior RDCS Referring Phys:  [id number] Lanier Prude Diagnosing Phys: Riley Lam MD  Sonographer Comments: 27mm Watchman FLX Implanted PROCEDURE: After discussion of the risks and benefits of a TEE, an informed consent was obtained from the patient. The transesophogeal probe was passed without difficulty through the esophogus of the patient. Sedation performed by different physician.  The patient was monitored while under deep sedation. The patient developed no complications during the procedure.  IMPRESSIONS  1. Prior to procedure, patent left atrial appendage. Maximal diameter 2.19 cm (ostial measurements). Sufficient sizing for a 27 mm Watchman FLX device.  2. A mid-posterior transeptal puncture was performed.  3. a 27 mm Watchman FLX was deployed. Greater than 1/2 shoulder seen. Small tissue compression seen at the anterior mitral valve annulus. No thrombus seen. Device recaptured.  4. At redeployment a 27 mm Watchman FLX was present. No peri-device leak. No thrombus. A 1/3-1/2 mitral shoulder was noted in the 135 view. Average compression ~ 15%.  5. No significant pericardial effusion seen throughout study.  6. Iatrogenic ASD present. Majority of flow was left to right, rare right to left shunting.  7. Left ventricular ejection fraction, by estimation, is 55 to 60%. The left ventricle has normal function.  8. Right ventricular systolic function is normal. The right ventricular size is normal.  9. Left atrial size was moderately dilated. No left atrial/left atrial appendage thrombus was detected. 10. Right atrial size was mildly dilated. 11. The mitral valve is normal in structure. Mild mitral  valve regurgitation. 12. The aortic valve is tricuspid. Aortic valve regurgitation is not visualized. No aortic stenosis is present. 13. Evidence of atrial level shunting detected by color flow Doppler. FINDINGS  Left Ventricle: Left ventricular ejection fraction, by estimation, is 55 to 60%. The left ventricle has normal function. The left ventricular internal cavity size was normal in size. Right Ventricle: The right ventricular size is normal. Right vetricular wall thickness was not well visualized. Right ventricular systolic function is normal. Left Atrium: Left atrial size was moderately dilated. No left atrial/left atrial appendage thrombus was detected. Right Atrium: Right atrial size was mildly  dilated. Pericardium: There is no evidence of pericardial effusion. Mitral Valve: The mitral valve is normal in structure. Mild mitral valve regurgitation. Tricuspid Valve: The tricuspid valve is normal in structure. Tricuspid valve regurgitation is trivial. No evidence of tricuspid stenosis. Aortic Valve: The aortic valve is tricuspid. Aortic valve regurgitation is not visualized. No aortic stenosis is present. Pulmonic Valve: The pulmonic valve was normal in structure. Pulmonic valve regurgitation is not visualized. No evidence of pulmonic stenosis. Aorta: The aortic root, ascending aorta, aortic arch and descending aorta are all structurally normal, with no evidence of dilitation or obstruction. IAS/Shunts: Evidence of atrial level shunting detected by color flow Doppler. Additional Comments: Spectral Doppler performed. Riley Lam MD Electronically signed by Riley Lam MD Signature Date/Time: 04/20/2022/10:50:03 AM    Final     Microbiology: Recent Results (from the past 240 hour(s))  Body fluid culture w Gram Stain     Status: None (Preliminary result)   Collection Time: 05/12/22  3:38 PM   Specimen: Pleura; Body Fluid  Result Value Ref Range Status   Specimen Description   Final    PLEURAL Performed at [facility], [address] [place], [place] [address]    Special Requests   Final    NONE Performed at [facility], [address] [place], [place] [address]    Gram Stain   Final    FEW WBC PRESENT, PREDOMINANTLY PMN NO ORGANISMS SEEN    Culture   Final    NO GROWTH 2 DAYS Performed at [facility], [address] [place], [place] [address]    Report Status PENDING  Incomplete    Time spent: 90 minutes  Signed: Leeroy Bock, MD 04/17/2022

## 2022-05-17 NOTE — Progress Notes (Signed)
Patient states that she wishes to be a DNR. Patient heart rate remains elevated in the 150's very anxious and nauseous and unable to take PO medications at this time. daughter is at bedside. MD notified of findings.

## 2022-05-17 DEATH — deceased

## 2022-05-22 ENCOUNTER — Ambulatory Visit: Payer: Medicare HMO

## 2022-05-22 NOTE — Telephone Encounter (Signed)
Upon looking through notes, the patient was looked up and she is now deceased as of 06/12/2022. In my personal notes (but incidentally not Epic), I documented a TOC call. Late entry from 04/21/2022:  Contacted the patient regarding discharge from [facility] on 04/20/2022  The patient understands to follow up with Georgie Chard on 05/22/2022 in preparation for imaging on 06/20/2022.  The patient understands discharge instructions? Yes  The patient understands medications and regimen? Yes   The patient reports groin site looks healthy.  The patient understands to call with any questions or concerns prior to scheduled visit.

## 2022-06-20 ENCOUNTER — Other Ambulatory Visit (HOSPITAL_COMMUNITY): Payer: Medicare HMO

## 2022-06-21 ENCOUNTER — Other Ambulatory Visit (HOSPITAL_COMMUNITY): Payer: Medicare HMO
